# Patient Record
Sex: Female | Born: 1960 | Race: White | Hispanic: No | Smoking: Former smoker
Health system: Southern US, Community
[De-identification: ages and names within clinical notes are randomized; demographics above are authoritative.]

## PROBLEM LIST (undated history)

## (undated) DIAGNOSIS — D649 Anemia, unspecified: Secondary | ICD-10-CM

## (undated) DIAGNOSIS — K219 Gastro-esophageal reflux disease without esophagitis: Secondary | ICD-10-CM

## (undated) DIAGNOSIS — M1990 Unspecified osteoarthritis, unspecified site: Secondary | ICD-10-CM

## (undated) DIAGNOSIS — E669 Obesity, unspecified: Secondary | ICD-10-CM

## (undated) DIAGNOSIS — G43909 Migraine, unspecified, not intractable, without status migrainosus: Secondary | ICD-10-CM

## (undated) DIAGNOSIS — M898X9 Other specified disorders of bone, unspecified site: Secondary | ICD-10-CM

## (undated) DIAGNOSIS — N393 Stress incontinence (female) (male): Secondary | ICD-10-CM

## (undated) HISTORY — DX: Obesity, unspecified: E66.9

## (undated) HISTORY — DX: Stress incontinence (female) (male): N39.3

## (undated) HISTORY — DX: Anemia, unspecified: D64.9

## (undated) HISTORY — DX: Migraine, unspecified, not intractable, without status migrainosus: G43.909

## (undated) HISTORY — DX: Gastro-esophageal reflux disease without esophagitis: K21.9

## (undated) HISTORY — PX: ABDOMINAL HYSTERECTOMY: SHX81

## (undated) HISTORY — DX: Unspecified osteoarthritis, unspecified site: M19.90

## (undated) HISTORY — PX: SPINE SURGERY: SHX786

---

## 2008-11-23 ENCOUNTER — Ambulatory Visit: Payer: Self-pay | Admitting: Family Medicine

## 2008-11-30 ENCOUNTER — Ambulatory Visit: Payer: Self-pay

## 2008-12-28 ENCOUNTER — Ambulatory Visit: Payer: Self-pay | Admitting: Family Medicine

## 2008-12-31 ENCOUNTER — Ambulatory Visit: Payer: Self-pay | Admitting: Family Medicine

## 2009-01-12 ENCOUNTER — Ambulatory Visit: Payer: Self-pay | Admitting: Obstetrics and Gynecology

## 2009-01-13 ENCOUNTER — Ambulatory Visit: Payer: Self-pay | Admitting: Gastroenterology

## 2010-08-15 ENCOUNTER — Ambulatory Visit: Payer: Self-pay | Admitting: Family Medicine

## 2010-08-24 ENCOUNTER — Ambulatory Visit (HOSPITAL_COMMUNITY): Admission: RE | Admit: 2010-08-24 | Discharge: 2010-08-24 | Payer: Self-pay | Admitting: Family Medicine

## 2010-09-05 ENCOUNTER — Ambulatory Visit: Payer: Self-pay | Admitting: Family Medicine

## 2010-10-23 HISTORY — PX: LAPAROSCOPIC HYSTERECTOMY: SHX1926

## 2010-11-02 ENCOUNTER — Ambulatory Visit (HOSPITAL_COMMUNITY): Admission: RE | Admit: 2010-11-02 | Discharge: 2010-11-03 | Payer: Self-pay | Admitting: Family Medicine

## 2010-11-02 ENCOUNTER — Ambulatory Visit: Payer: Self-pay | Admitting: Family Medicine

## 2010-11-22 ENCOUNTER — Ambulatory Visit: Payer: Self-pay | Admitting: Family Medicine

## 2010-12-05 ENCOUNTER — Ambulatory Visit: Payer: Self-pay | Admitting: Family Medicine

## 2010-12-17 DIAGNOSIS — N393 Stress incontinence (female) (male): Secondary | ICD-10-CM

## 2010-12-17 HISTORY — DX: Stress incontinence (female) (male): N39.3

## 2010-12-26 ENCOUNTER — Ambulatory Visit
Admission: RE | Admit: 2010-12-26 | Discharge: 2010-12-26 | Payer: Self-pay | Source: Home / Self Care | Attending: Family Medicine | Admitting: Family Medicine

## 2011-02-27 LAB — CBC
HCT: 40.4 % (ref 36.0–46.0)
MCH: 30.7 pg (ref 26.0–34.0)
MCHC: 33.7 g/dL (ref 30.0–36.0)
MCHC: 33.8 g/dL (ref 30.0–36.0)
Platelets: 145 10*3/uL — ABNORMAL LOW (ref 150–400)
Platelets: 197 10*3/uL (ref 150–400)
RDW: 12.8 % (ref 11.5–15.5)
RDW: 13 % (ref 11.5–15.5)
WBC: 6.4 10*3/uL (ref 4.0–10.5)

## 2011-02-27 LAB — SURGICAL PCR SCREEN
MRSA, PCR: NEGATIVE
Staphylococcus aureus: NEGATIVE

## 2011-02-27 LAB — PREGNANCY, URINE: Preg Test, Ur: NEGATIVE

## 2011-03-06 ENCOUNTER — Ambulatory Visit: Payer: Self-pay | Admitting: Obstetrics and Gynecology

## 2011-03-13 ENCOUNTER — Other Ambulatory Visit: Payer: Self-pay | Admitting: Family Medicine

## 2011-03-13 ENCOUNTER — Encounter: Payer: Self-pay | Admitting: Obstetrics & Gynecology

## 2011-03-13 ENCOUNTER — Ambulatory Visit: Payer: 59 | Admitting: Obstetrics and Gynecology

## 2011-03-13 DIAGNOSIS — Z1272 Encounter for screening for malignant neoplasm of vagina: Secondary | ICD-10-CM

## 2011-03-13 DIAGNOSIS — Z1231 Encounter for screening mammogram for malignant neoplasm of breast: Secondary | ICD-10-CM

## 2011-03-13 DIAGNOSIS — Z01419 Encounter for gynecological examination (general) (routine) without abnormal findings: Secondary | ICD-10-CM

## 2011-03-13 DIAGNOSIS — Z111 Encounter for screening for respiratory tuberculosis: Secondary | ICD-10-CM

## 2011-03-13 LAB — CONVERTED CEMR LAB
AST: 15 units/L (ref 0–37)
Alkaline Phosphatase: 58 units/L (ref 39–117)
CO2: 22 meq/L (ref 19–32)
Calcium: 9.2 mg/dL (ref 8.4–10.5)
Glucose, Bld: 78 mg/dL (ref 70–99)
HDL: 46 mg/dL (ref >39)
Platelets: 198 10*3/uL (ref 150–400)
Potassium: 4.4 meq/L (ref 3.5–5.3)
RBC: 4.94 M/uL (ref 3.87–5.11)
Sodium: 140 meq/L (ref 135–145)
TSH: 1.656 microintl units/mL (ref 0.350–4.500)
Total Bilirubin: 0.5 mg/dL (ref 0.3–1.2)
Total CHOL/HDL Ratio: 3.8
WBC: 5.7 10*3/uL (ref 4.0–10.5)

## 2011-03-14 NOTE — Assessment & Plan Note (Signed)
[name], [name]             ACCOUNT NO.:  [id number]  MEDICAL RECORD NO.:  [id number]           PATIENT TYPE:  O  LOCATION:  [facility] at [facility]          FACILITY:  [facility]  PHYSICIAN:  Allie Bossier, MD        DATE OF BIRTH:  01/06/1961  DATE OF SERVICE:  03/13/2011                                 CLINIC NOTE  [name] is a 50 year old married white G2 P2 who is coming in for annual exam.  Her complaint today is that of worsening stress incontinence.  She says that she essentially has to wear panty liners every day.  Of note, her daughters are cheerleaders and whenever [name] goes to watch her daughter cheers, she inevitably jumps and claps and urinates in her pants.  She has also gained 5 pounds in the last several months.  PAST MEDICAL HISTORY:  Obesity, reflux, and stress incontinence.  PAST SURGICAL HISTORY:  She had a laparoscopic supracervical hysterectomy on November 02, 2010.  SOCIAL HISTORY:  She reports rare alcohol.  She quit smoking when she was age 41.  She denies illegal drug use.  MEDICATIONS:  She takes Prilosec daily, a sleep aid daily, multivitamin daily, fish oil daily, and iron daily.  No latex allergies.  No drug allergies.  No food allergies.  REVIEW OF SYSTEMS:  She is married and monogamous.  She denies dyspareunia.  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  Remainder of her review of systems questions are negative.  FAMILY HISTORY:  No breast, GYN, or colon malignancies.  She says no diabetes or heart disease.  PHYSICAL EXAMINATION:  GENERAL:  A well-nourished, well-hydrated white female in no apparent distress. HEENT:  Normal. BREASTS:  Normal. HEART:  Regular rhythm. LUNGS:  Clear to auscultation bilaterally. ABDOMEN:  No palpable hepatosplenomegaly. EXTERNAL GENITALIA:  Normal.  Bimanual exam reveals no pelvic masses and her pelvic exam induces no tenderness.  She has a positive Q-tip test.  ASSESSMENT AND PLAN: 1. Annual exam.   I have checked A Pap smear.  Recommended self-breast     and self-vulvar exams.  Recommended annual mammograms and that she     have a colonoscopy. 2. Stress incontinence.  After discussion of risks and benefits, she     would like to have a mid urethral sling.  I am     scheduling her for a outpatient Advantage Fit sling.  I have also     recommended weight loss for her general health maintenance.  I am     checking fasting lipids, CBC, TSH, and CMET.     Allie Bossier, MD    MCD/MEDQ  D:  03/13/2011  T:  03/14/2011  Job:  [id number]

## 2011-03-21 ENCOUNTER — Ambulatory Visit (HOSPITAL_COMMUNITY): Payer: 59

## 2011-03-22 ENCOUNTER — Ambulatory Visit (HOSPITAL_COMMUNITY)
Admission: RE | Admit: 2011-03-22 | Discharge: 2011-03-22 | Disposition: A | Discharge status: Home / Self Care | Payer: 59 | Source: Ambulatory Visit | Attending: Family Medicine | Admitting: Family Medicine

## 2011-03-22 DIAGNOSIS — Z1231 Encounter for screening mammogram for malignant neoplasm of breast: Secondary | ICD-10-CM

## 2011-03-28 ENCOUNTER — Other Ambulatory Visit: Payer: Self-pay | Admitting: Family Medicine

## 2011-03-28 DIAGNOSIS — R928 Other abnormal and inconclusive findings on diagnostic imaging of breast: Secondary | ICD-10-CM

## 2011-04-02 ENCOUNTER — Ambulatory Visit
Admission: RE | Admit: 2011-04-02 | Discharge: 2011-04-02 | Disposition: A | Discharge status: Home / Self Care | Payer: 59 | Source: Ambulatory Visit | Attending: Family Medicine | Admitting: Family Medicine

## 2011-04-02 DIAGNOSIS — R928 Other abnormal and inconclusive findings on diagnostic imaging of breast: Secondary | ICD-10-CM

## 2011-05-01 NOTE — Assessment & Plan Note (Signed)
[name], Vanessa             ACCOUNT NO.:  [id number]   MEDICAL RECORD NO.:  [id number]          PATIENT TYPE:  POB   LOCATION:  [facility] at [facility]         FACILITY:  [facility]   PHYSICIAN:  Tinnie Gens, MD        DATE OF BIRTH:  05-12-1961   DATE OF SERVICE:  12/26/2010                                  CLINIC NOTE   CHIEF COMPLAINT:  Postop check.   HISTORY OF PRESENT ILLNESS:  The patient is a 50 year old para 2 who  underwent laparoscopic supracervical hysterectomy for bleeding  associated with fibroids on November 02, 2010.  Postop course has been  benign.  She is back at work.  She is doing well.  She has not resumed  sexual activity.  She is having some issues related to mood and  dissatisfaction with her job and home life.  Additionally, we had a  lengthy discussion regarding her daughter who is on Microgestin, oral  contraceptive and seems to be doing okay, so she does not want to take  it.  She would like to do a trial of NuvaRing.   PHYSICAL EXAMINATION:  VITAL SIGNS:  Are as noted in the chart.  GENERAL:  She is a well-developed well-nourished female in no acute  distress.  ABDOMEN:  Soft, nontender, nondistended.  Incisions are all well healed.  GU:  Normal external female genitalia.  BUS is normal.  Vagina is pink  and rugated.  Cervix is parous without lesion.  Uterus and adnexa are  surgically absent.   IMPRESSION:  1. Postop check doing well.  2. Daughter who is on oral contraceptives at age 17.   PLAN:  1. The patient should come back in March for another Pap smear as she      still has the cervix.  2. I have given her a sample of NuvaRing to try with her daughter next      month, __________ have her daughter come in and see [place] and we will      write a prescription and do full eval.           ______________________________  Tinnie Gens, MD     TP/MEDQ  D:  12/26/2010  T:  12/27/2010  Job:  [id number]

## 2011-05-01 NOTE — Assessment & Plan Note (Signed)
[name], [name]             ACCOUNT NO.:  [id number]   MEDICAL RECORD NO.:  [id number]          PATIENT TYPE:  POB   LOCATION:  [facility] at [facility]         FACILITY:  [facility]   PHYSICIAN:  Tinnie Gens, MD        DATE OF BIRTH:  21-Jul-1961   DATE OF SERVICE:  11/22/2010                                  CLINIC NOTE   CHIEF COMPLAINT:  Postop visit.   HISTORY OF PRESENT ILLNESS:  The patient is a 50 year old para 2 who  underwent a laparoscopic supracervical hysterectomy for bleeding  associated with fibroids.  She had that procedure done on November 02, 2010.  Her procedure went well.  Her postop course has been benign.  Her  pathology is reviewed, it showed benign leiomyoma, normal endometrium,  adenomyosis.  The patient reports improved pain control, improved  problems with constipation, and increasing activity.  She is tolerating  p.o. well, voiding without difficulty.   PHYSICAL EXAMINATION:  VITAL SIGNS:  As noted in the chart.  ABDOMEN:  Her incisions are well-healed.  There is a small amount of  suture noted at the right lower quadrant port, which was trimmed.  I  have advised her to use hydrogen peroxide on gauze pad and scrub until  that goes away.   IMPRESSION:  Status postop laparoscopic cervical hysterectomy, doing  well.   PLAN:  Follow up in 4 weeks for final postop check, may return to work  with limited duty on December 04, 2010.           ______________________________  Tinnie Gens, MD     TP/MEDQ  D:  11/22/2010  T:  11/23/2010  Job:  [id number]

## 2011-05-01 NOTE — Assessment & Plan Note (Signed)
NAMEJESSE, HIRST             ACCOUNT NO.:  [id number]   MEDICAL RECORD NO.:  [id number]          PATIENT TYPE:  POB   LOCATION:  [facility] at [facility]         FACILITY:  [facility]   PHYSICIAN:  Tinnie Gens, MD        DATE OF BIRTH:  10-31-1961   DATE OF SERVICE:  09/05/2010                                  CLINIC NOTE   CHIEF COMPLAINT:  Followup results.   HISTORY OF PRESENT ILLNESS:  The patient is a 50 year old para 2 who has  previously been seen for abnormal uterine bleeding.  She has undergone  endometrial sampling x2 which has revealed a polyp.  She has also had  her pill changed from a 30 mcg pill to a 35 mcg pill.  She had  ultrasound that showed a normal-appearing uterus with 2 small fibroids  1.2 x 1.5 x 1.5 x 1.1 x 1.2 x 1.5.  The uterus itself was 7 x 4 x 5 cm.  The ovaries appeared normal.  Lengthy discussion was held with the  patient about alternatives for treatment at her last visit and she  returns today after hearing results to discuss treatment options.  She  is really interested in definitive treatment which was involved  hysterectomy and we discussed laparoscopic hysterectomy as probably the  easiest and best way to go.  The patient is worried about her disability  from work and may opt for less definitive, although perhaps equally  effective hydrothermal ablation.   IMPRESSION:  Abnormal uterine bleeding uncontrolled with oral  contraceptive with negative workup.   PLAN:  The patient will discuss with her husband and will call back with  her decision regarding D and C, hysteroscopy with HTA versus  laparoscopic supracervical hysterectomy.           ______________________________  Tinnie Gens, MD     TP/MEDQ  D:  09/05/2010  T:  09/06/2010  Job:  [id number]

## 2011-05-01 NOTE — Assessment & Plan Note (Signed)
NAMEDENNICE, TINDOL             ACCOUNT NO.:  [id number]   MEDICAL RECORD NO.:  [id number]          PATIENT TYPE:  POB   LOCATION:  [facility]         FACILITY:  [facility]   PHYSICIAN:  Tinnie Gens, MD        DATE OF BIRTH:  03/05/1961   DATE OF SERVICE:  08/15/2010                                  CLINIC NOTE   CHIEF COMPLAINT:  Breakthrough bleeding.   HISTORY OF PRESENT ILLNESS:  The patient is a 50 year old para 2 who was  seen previously in approximately 18 months ago with abnormal uterine  bleeding.  She had a thickened endometrial stripe of 1.82 cm.  She  underwent endometrial sampling which showed benign pathology.  The  patient had been placed on a 30 mcg pill and had been doing well with  that.  She initially had heavy bleeding with a hemoglobin of 6.  However  since being on a 30 mcg pill, her hemoglobin had come up to 12 and since  her biopsy was normal, she was content to found that.  However over the  last 2-3 months, she has had breakthrough bleeding where she had no  bleeding the first week of her pill pack and then she had bleeding for  the rest of the time, followed by normal cycle on the last pill pack.  She had a Pap in March 2011 was normal.  Her hemoglobin levels have been  fine.  She also reports being extremely hot in the morning with  sweating, but not having true night sweats or hot flashes during the  day.   On exam today, vitals are as noted in the chart.  She is a well-  developed, well-nourished female in no acute distress.  Abdomen is soft,  nontender, nondistended.  GU, normal external female genitalia.  BUS is  normal.  Vagina is pink and rugated.  Cervix is parous.  There is a bit  of growth coming of the cervix that was taken off with a ring forceps.  The uterus was small and anteverted.   PROCEDURE:  After taking out of the polyp on the cervix, the cervix was  cleaned with Betadine and was grasped anteriorly with a single-tooth  tenaculum and a Pipelle was passed twice to obtain endometrial sampling.  The patient tolerated the procedure well as she was somewhat  uncomfortable during it.  The single-tooth tenaculum was removed from  the cervix.   IMPRESSION:  Abnormal uterine bleeding with history of this prior plus  anemia plus thickened endometrial stripe.   PLAN:  For an [facility] today, pelvic sonogram, endometrial sampling today.  We will change her to a 35 mcg monophasic pill and see her back in 2  weeks for discussion results of other possible treatment options.  We  discussed IUD, endometrial ablation and hysterectomy as potential.           ______________________________  Tinnie Gens, MD     TP/MEDQ  D:  08/15/2010  T:  08/16/2010  Job:  [id number]

## 2011-05-01 NOTE — Assessment & Plan Note (Signed)
[name], [name]             ACCOUNT NO.:  [id number]   MEDICAL RECORD NO.:  [id number]          PATIENT TYPE:  POB   LOCATION:  [facility]         FACILITY:  [facility]   PHYSICIAN:  Tinnie Gens, MD        DATE OF BIRTH:  Jan 27, 1961   DATE OF SERVICE:                                  CLINIC NOTE   CHIEF COMPLAINT:  Menorrhagia.   HISTORY OF PRESENT ILLNESS:  The patient is a 50 year old para 2, who is  referred by [facility] family [facility] for menorrhagia and anemia,  specifically Liane Comber, NP referred her.  She was seen there in  December of this year and was found to have a hemoglobin of 6.  At that  time, she underwent a CPE and had her Pap smear done.  She is scheduled  for mammogram on Friday of this week.  She is found to be Hemoccult  negative with this very low hemoglobin.  At that time, she was started  on Loestrin.  A pelvic ultrasound was obtained and she was started on  iron t.i.d.  Her hemoglobin has come up.  Her most recent hemoglobin was  12.2, which is much improved.  She has decreased her iron to twice daily  since that time, though Loestrin has done really well to control her  bleeding.  The patient reports that her cycles have just gotten heavier  since the birth of her last daughter 15 years ago, but her cycles are  regular, just lasts longer and seems to be heavier.  Her pelvic sonogram  is with her today and is reviewed, which shows a mildly enlarged uterus  at 9.7 x 5.2 x 6.3.  She has one small fibroids that is posterior, that  is 1.3 x 1.8 x 1.6, which is unlikely to be causing her issue.  She has  a small cyst on her right ovary, probably functional, but she does have  a thickened endometrial stripe at 1.82 cm.  The sonogram was done right  before her cycle.  It could be that her thickened endometrial was  related to that.  However, given that she is over 35, she does need  endometrial sampling.   PAST MEDICAL HISTORY:  Significant for  osteoarthritis.   PAST SURGICAL HISTORY:  Negative.   MEDICATIONS:  She is on Prilosec daily, iron twice daily, Aviane, I  think generic for Loestrin, multivitamin, and over-the-counter sleep aid  at bedtime as needed.   ALLERGIES:  None known.   OBSTETRICAL HISTORY:  She is a para 2 and 2 vaginal deliveries.   GYN HISTORY:  Menarche at age 60.  Cycles every 28 days, last for 6 days  with heavy-to-moderate flow and some pain with certain periods, but not  every time.  She has no history of abnormal Pap smear.  She has not had  colonoscopy as yet.   FAMILY HISTORY:  Negative for diabetes, coronary artery disease, or  cancer.   SOCIAL HISTORY:  She works for [employer]. [employer].  She lives with  husband and 2 children.  She was a previous smoker, but has been clear  for the past 4 years.  No alcohol use or drug use.   REVIEW OF SYSTEMS:  A 14-point review of systems is reviewed, is  positive for numbness and weakness in her fingers, swelling in her legs,  fatigue, weight gain, dizzy spells, problems with breathing, shortness  of breath, some hot flashes and loss of urine with coughing and sneezing  a lot.  This was adjusted by her primary care Cleotha Whalin and a lot of that  was due to anemia.  She does feel much improved in terms of her  dizziness and fatigue, as well as, her shortness of breath.   PHYSICAL EXAMINATION:  VITAL SIGNS:  Today her weight is 192, blood  pressure is 112/81, pulse is 69.  GENERAL:  She is well-developed, well-nourished female in no acute  distress.  HEENT:  Normocephalic and atraumatic.  Sclerae anicteric.  NECK:  Supple.  LUNGS:  Clear.  CARDIOVASCULAR:  Regular.  ABDOMEN:  Soft, nontender.  No masses.  GENITOURINARY:  Normal external female genitalia.  BUS is normal.  Vagina is pink and rugated.  Cervix is parous without lesion.  Uterus is  small, approximately 10-week size.  No adnexal mass or tenderness is  appreciated.   PROCEDURE:  Cervix is  cleaned with Betadine x2, it was grasped  anteriorly with single tooth tenaculum.  It sounds to approximately 9  cm.  Endometrial sampling was taken without difficulty.   IMPRESSION:  1. Menorrhagia.  2. Anemia.  3. Thickened endometrial stripe.   PLAN:  Endometrial biopsy.  Follow up 2 weeks for results of this and  discussion of alternatives to treatment.  A discussion was had with her  about continuing on OC's or an IUD, endometrial ablation versus  hysterectomy, but none of these would be in consideration until biopsy  results are back.   Thank you very much for referring this patient.  I look forward to  participating in her care.  We will keep you abreast of any pathology or  decisions that were made regarding her care.           ______________________________  Tinnie Gens, MD     TP/MEDQ  D:  12/28/2008  T:  12/29/2008  Job:  [id number]

## 2011-05-02 ENCOUNTER — Ambulatory Visit (HOSPITAL_COMMUNITY)
Admission: RE | Admit: 2011-05-02 | Discharge: 2011-05-02 | Disposition: A | Discharge status: Home / Self Care | Payer: 59 | Source: Ambulatory Visit | Attending: Obstetrics & Gynecology | Admitting: Obstetrics & Gynecology

## 2011-05-02 DIAGNOSIS — N393 Stress incontinence (female) (male): Secondary | ICD-10-CM

## 2011-05-02 DIAGNOSIS — N9089 Other specified noninflammatory disorders of vulva and perineum: Secondary | ICD-10-CM

## 2011-05-02 HISTORY — PX: OTHER SURGICAL HISTORY: SHX169

## 2011-05-02 LAB — CBC
HCT: 44.6 % (ref 36.0–46.0)
MCHC: 33.6 g/dL (ref 30.0–36.0)
MCV: 88.3 fL (ref 78.0–100.0)
RDW: 12.9 % (ref 11.5–15.5)

## 2011-05-17 ENCOUNTER — Encounter (INDEPENDENT_AMBULATORY_CARE_PROVIDER_SITE_OTHER): Payer: 59 | Admitting: Family Medicine

## 2011-05-17 DIAGNOSIS — Z09 Encounter for follow-up examination after completed treatment for conditions other than malignant neoplasm: Secondary | ICD-10-CM

## 2011-05-17 DIAGNOSIS — K5900 Constipation, unspecified: Secondary | ICD-10-CM

## 2011-05-18 NOTE — Assessment & Plan Note (Signed)
[name], [name]             ACCOUNT NO.:  [id number]  MEDICAL RECORD NO.:  [id number]           PATIENT TYPE:  LOCATION:  [facility]           FACILITY:  PHYSICIAN:  Tinnie Gens, MD        DATE OF BIRTH:  [birth year]  DATE OF SERVICE:  05/17/2011                                 CLINIC NOTE  CHIEF COMPLAINT:  Postop having pain.  HISTORY OF PRESENT ILLNESS:  This is a 50 year old gravida 2, para 2 who underwent a TVT by Dr. Nicholaus Bloom last 2 weeks ago.  Since that time, she has been doing well, has been on pain meds from last Friday.  However, she returned to work on Tuesday and for 2 days about mid morning she was having increasing lower abdominal cramping, needs the pain medicine, which she has run out of.  She reports 30 minutes after taking it has felt better. She is reporting good result with no retention, and no real problems with sneezing or coughing.  No real leakage was sneezing or coughing.  PHYSICAL EXAMINATION:  VITAL SIGNS:  As noted in the chart. GENERAL:  She is well-developed, well-nourished female in no acute distress. GU:  Normal external female genitalia.  BUS is normal.  Vagina is pink. Cervix is present.  We can feel the TVT through the underlying vaginal mucosa.  There is no erosion there.   IMPRESSION:  Unclear etiology with continued pain with working.  I wonder if she is having bladder spasms or this is just a scarring from the device.  PLAN:  We will write her for peridium 10 mg t.i.d. as needed, and I have refilled Percocet #20, no refills.          ______________________________ Tinnie Gens, MD    TP/MEDQ  D:  05/17/2011  T:  05/17/2011  Job:  [id number]

## 2011-05-28 NOTE — Op Note (Signed)
NAMEBREEANN, REPOSA             ACCOUNT NO.:  [id number]  MEDICAL RECORD NO.:  [id number]           PATIENT TYPE:  O  LOCATION:  [facility]                          FACILITY:  [facility]  PHYSICIAN:  Allie Bossier, MD        DATE OF BIRTH:  09/17/1961  DATE OF PROCEDURE:  05/02/2011 DATE OF DISCHARGE:                              OPERATIVE REPORT   PREOPERATIVE DIAGNOSES: 1. Genuine stress urinary incontinence. 2. Vulvar lesion.  POSTOPERATIVE DIAGNOSES: 1. Genuine stress urinary incontinence. 2. Vulvar lesion.  PROCEDURES: 1. Midurethral sling (TVT Exact) cystoscopy. 2. Excision of vulvar lesion.  SURGEON:  Allie Bossier, MD  ASSISTANT:  Horton Chin, MD  COMPLICATIONS:  None.  ESTIMATED BLOOD LOSS:  Minimal.  SPECIMENS:  None.  FINDINGS:  Normal cystoscopy.  DETAILED PROCEDURE AND FINDINGS:  The risks, benefits, and alternatives of surgery were explained, understood, and accepted.  Consents were signed.  Preoperatively, the patient requested that I remove a vulvar lesion from her left labia minor.  I had noticed this before essentially an embedded whitehead that measures about a centimeter, but I agreed to open it and excised the contents for her.  I had discussed with her and husband not being able to avoid immediately after surgery and having the problem with catheter.  I discussed risks of some postvoid dribbling as well as discussed with her in the office through increased risk of new onset urge incontinence.  All questions were answered in the operating room.  General anesthesia was applied without complication.  She was placed in dorsal lithotomy position.  Her lower abdomen and vagina were prepped and draped in usual sterile fashion.  A weighted speculum was placed posteriorly.  I injected approximately 1 mL of 0.5% Marcaine in the skin near the vulvar lesion.  I made a 5-mm incision to excise both whitehead material from this.  I placed a silver nitrate at the  opening of the incision to yield excellent hemostasis.  I then made marks centimeter and half lateral to the midline on each side behind the symphysis pubis.  I injected 10 mL of 0.5% Marcaine for hydrodissection purposes and postop pain relief vertically behind the symphysis pubis on each side at the site of these marks.  I then injected the vaginal mucosa with 0.5% Marcaine, and total of 13 mL was used.  I made a 1-cm incision in the vaginal mucosa approximately a centimeter and half below the urethral opening.  I placed a catheter with a bladder stylus and deviated the bladder out of the operative site.  I used Metzenbaum scissors to dissect on each side of the urethra.  I then placed the TVT Exact on the patient's right and brought it up through the marked site behind her symphysis pubis.  I repeated the procedure on the left side. I then performed cystoscopy and noted that the bladder did not have any complications from cystotomies.  The remainder of cystoscopy was normal. I placed a Kocher at its shank between the sling and the vaginal mucosa and removed the plastic sheath and cut the sling at the skin edges.  These skin edges were very small and were closed with Steri-Strips on each side.  The vaginal incision was closed with a 3-0 Vicryl running locking suture.  Excellent hemostasis was noted.  She was extubated and taken to the recovery room in stable condition.  Instrument, sponge and needle counts were correct.     Allie Bossier, MD     MCD/MEDQ  D:  05/02/2011  T:  05/03/2011  Job:  [id number]  Electronically Signed by Nicholaus Bloom MD on 05/28/2011 09:45:30 AM

## 2011-06-19 ENCOUNTER — Ambulatory Visit: Payer: 59 | Admitting: Obstetrics & Gynecology

## 2011-07-04 ENCOUNTER — Encounter: Payer: Self-pay | Admitting: *Deleted

## 2011-07-04 DIAGNOSIS — E669 Obesity, unspecified: Secondary | ICD-10-CM | POA: Insufficient documentation

## 2011-07-04 DIAGNOSIS — M1990 Unspecified osteoarthritis, unspecified site: Secondary | ICD-10-CM | POA: Insufficient documentation

## 2011-07-04 DIAGNOSIS — K219 Gastro-esophageal reflux disease without esophagitis: Secondary | ICD-10-CM | POA: Insufficient documentation

## 2011-07-04 DIAGNOSIS — M19039 Primary osteoarthritis, unspecified wrist: Secondary | ICD-10-CM | POA: Insufficient documentation

## 2011-07-04 HISTORY — DX: Primary osteoarthritis, unspecified wrist: M19.039

## 2011-07-05 ENCOUNTER — Ambulatory Visit: Payer: 59 | Admitting: Obstetrics & Gynecology

## 2011-07-05 DIAGNOSIS — Z09 Encounter for follow-up examination after completed treatment for conditions other than malignant neoplasm: Secondary | ICD-10-CM

## 2011-07-18 ENCOUNTER — Telehealth: Payer: Self-pay | Admitting: *Deleted

## 2011-07-18 NOTE — Telephone Encounter (Signed)
Patient called complaining of pelvic pain for several days, worse on the L side sometimes sharp but usually dull aching pain.  I advised patient that it would be a good idea to get an ultrasound to see what exactly was going on.  She feels like it is her ovary, but wishes to give it a few more days taking Ibuprofin every 7 hrs and see how she feels.  I advised her to proceed to [facility] if her pain worsens or becomes consistent.  She agrees.

## 2012-01-29 ENCOUNTER — Encounter: Payer: Self-pay | Admitting: Family Medicine

## 2012-01-29 ENCOUNTER — Ambulatory Visit (INDEPENDENT_AMBULATORY_CARE_PROVIDER_SITE_OTHER): Payer: 59 | Admitting: Family Medicine

## 2012-01-29 VITALS — BP 142/100 | HR 91 | Ht 68.0 in | Wt 206.0 lb

## 2012-01-29 DIAGNOSIS — Z1272 Encounter for screening for malignant neoplasm of vagina: Secondary | ICD-10-CM

## 2012-01-29 DIAGNOSIS — Z01419 Encounter for gynecological examination (general) (routine) without abnormal findings: Secondary | ICD-10-CM

## 2012-01-29 NOTE — Progress Notes (Signed)
Patient is here for routine yearly exam.  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  She will be losing her insurance soon and would like her iron checked as well.  Would like flu shot today.

## 2012-01-29 NOTE — Progress Notes (Signed)
  Subjective:     [name] is a 51 y.o. female and is here for a comprehensive physical exam. The patient reports problems - some cyclical pain lasting one day.Marland Kitchen  History   Social History  . Marital Status: Married    Spouse Name: N/A    Number of Children: N/A  . Years of Education: N/A   Occupational History  . Not on file.   Social History Main Topics  . Smoking status: Former Smoker    Types: Cigarettes    Quit date: 01/28/2003  . Smokeless tobacco: Never Used  . Alcohol Use: Not on file  . Drug Use: Not on file  . Sexually Active: Yes -- Female partner(s)   Other Topics Concern  . Not on file   Social History Narrative  . No narrative on file   Health Maintenance  Topic Date Due  . Pap Smear  08/09/1981  . Tetanus/tdap  08/09/1982  . Influenza Vaccine  09/17/2011    The following portions of the patient's history were reviewed and updated as appropriate: allergies, current medications, past family history, past medical history, past social history, past surgical history and problem list.  Review of Systems A comprehensive review of systems was negative.   Objective:    BP 142/100  Pulse 91  Ht 5\' 8"  (1.727 m)  Wt 206 lb (93.441 kg)  BMI 31.32 kg/m2 General appearance: alert, cooperative and appears stated age Head: Normocephalic, without obvious abnormality, atraumatic Neck: no adenopathy, supple, symmetrical, trachea midline and thyroid not enlarged, symmetric, no tenderness/mass/nodules Lungs: clear to auscultation bilaterally Breasts: normal appearance, no masses or tenderness Heart: regular rate and rhythm, S1, S2 normal, no murmur, click, rub or gallop Abdomen: soft, non-tender; bowel sounds normal; no masses,  no organomegaly Pelvic: cervix normal in appearance, external genitalia normal, no adnexal masses or tenderness, no cervical motion tenderness, uterus surgically absent and vagina normal without discharge Extremities: extremities normal,  atraumatic, no cyanosis or edema Pulses: 2+ and symmetric Skin: Skin color, texture, turgor normal. No rashes or lesions Lymph nodes: Cervical, supraclavicular, and axillary nodes normal. Neurologic: Grossly normal    Assessment:    Healthy female exam. Mittlesmertz pain     Plan:  Pap smear Mammogram CBC, TSH, CMP, Lipid profile   See After Visit Summary for Counseling Recommendations

## 2012-02-04 ENCOUNTER — Other Ambulatory Visit: Payer: 59

## 2014-12-04 ENCOUNTER — Emergency Department (HOSPITAL_COMMUNITY)
Admission: EM | Admit: 2014-12-04 | Discharge: 2014-12-04 | Disposition: A | Discharge status: Home / Self Care | Payer: BC Managed Care – PPO | Attending: Emergency Medicine | Admitting: Emergency Medicine

## 2014-12-04 ENCOUNTER — Emergency Department (HOSPITAL_COMMUNITY): Payer: BC Managed Care – PPO

## 2014-12-04 ENCOUNTER — Encounter (HOSPITAL_COMMUNITY): Payer: Self-pay | Admitting: *Deleted

## 2014-12-04 DIAGNOSIS — M549 Dorsalgia, unspecified: Secondary | ICD-10-CM

## 2014-12-04 DIAGNOSIS — K219 Gastro-esophageal reflux disease without esophagitis: Secondary | ICD-10-CM | POA: Insufficient documentation

## 2014-12-04 DIAGNOSIS — M545 Low back pain, unspecified: Secondary | ICD-10-CM

## 2014-12-04 DIAGNOSIS — Z79899 Other long term (current) drug therapy: Secondary | ICD-10-CM | POA: Diagnosis not present

## 2014-12-04 DIAGNOSIS — E669 Obesity, unspecified: Secondary | ICD-10-CM | POA: Diagnosis not present

## 2014-12-04 DIAGNOSIS — Z8742 Personal history of other diseases of the female genital tract: Secondary | ICD-10-CM | POA: Insufficient documentation

## 2014-12-04 DIAGNOSIS — G8929 Other chronic pain: Secondary | ICD-10-CM | POA: Insufficient documentation

## 2014-12-04 DIAGNOSIS — Z87891 Personal history of nicotine dependence: Secondary | ICD-10-CM | POA: Insufficient documentation

## 2014-12-04 HISTORY — DX: Other specified disorders of bone, unspecified site: M89.8X9

## 2014-12-04 MED ORDER — HYDROCODONE-ACETAMINOPHEN 5-325 MG PO TABS: 1.0000 | ORAL_TABLET | ORAL | Status: DC | PRN

## 2014-12-04 MED ORDER — METHOCARBAMOL 500 MG PO TABS
1000.0000 mg | ORAL_TABLET | Freq: Once | ORAL | Status: AC
  Administered 2014-12-04: 1000 mg via ORAL
  Filled 2014-12-04: qty 2

## 2014-12-04 MED ORDER — METHOCARBAMOL 750 MG PO TABS: 750.0000 mg | ORAL_TABLET | Freq: Four times a day (QID) | ORAL | Status: DC | PRN

## 2014-12-04 NOTE — Discharge Instructions (Signed)
Read the information below.  Use the prescribed medication as directed.  Please discuss all new medications with your pharmacist.  Do not take additional tylenol while taking the prescribed pain medication to avoid overdose.  You may return to the Emergency Department at any time for worsening condition or any new symptoms that concern you.   If you develop fevers, loss of control of bowel or bladder, weakness or numbness in your legs, or are unable to walk, return to the ER for a recheck.    Back Pain, Adult Low back pain is very common. About 1 in 5 people have back pain.The cause of low back pain is rarely dangerous. The pain often gets better over time.About half of people with a sudden onset of back pain feel better in just 2 weeks. About 8 in 10 people feel better by 6 weeks.  CAUSES Some common causes of back pain include:  Strain of the muscles or ligaments supporting the spine.  Wear and tear (degeneration) of the spinal discs.  Arthritis.  Direct injury to the back. DIAGNOSIS Most of the time, the direct cause of low back pain is not known.However, back pain can be treated effectively even when the exact cause of the pain is unknown.Answering your caregiver's questions about your overall health and symptoms is one of the most accurate ways to make sure the cause of your pain is not dangerous. If your caregiver needs more information, he or she may order lab work or imaging tests (X-rays or MRIs).However, even if imaging tests show changes in your back, this usually does not require surgery. HOME CARE INSTRUCTIONS For many people, back pain returns.Since low back pain is rarely dangerous, it is often a condition that people can learn to [facility] their own.   Remain active. It is stressful on the back to sit or stand in one place. Do not sit, drive, or stand in one place for more than 30 minutes at a time. Take short walks on level surfaces as soon as pain allows.Try to increase  the length of time you walk each day.  Do not stay in bed.Resting more than 1 or 2 days can delay your recovery.  Do not avoid exercise or work.Your body is made to move.It is not dangerous to be active, even though your back may hurt.Your back will likely heal faster if you return to being active before your pain is gone.  Pay attention to your body when you bend and lift. Many people have less discomfortwhen lifting if they bend their knees, keep the load close to their bodies,and avoid twisting. Often, the most comfortable positions are those that put less stress on your recovering back.  Find a comfortable position to sleep. Use a firm mattress and lie on your side with your knees slightly bent. If you lie on your back, put a pillow under your knees.  Only take over-the-counter or prescription medicines as directed by your caregiver. Over-the-counter medicines to reduce pain and inflammation are often the most helpful.Your caregiver may prescribe muscle relaxant drugs.These medicines help dull your pain so you can more quickly return to your normal activities and healthy exercise.  Put ice on the injured area.  Put ice in a plastic bag.  Place a towel between your skin and the bag.  Leave the ice on for 15-20 minutes, 03-04 times a day for the first 2 to 3 days. After that, ice and heat may be alternated to reduce pain and spasms.  Ask your caregiver about trying back exercises and gentle massage. This may be of some benefit.  Avoid feeling anxious or stressed.Stress increases muscle tension and can worsen back pain.It is important to recognize when you are anxious or stressed and learn ways to manage it.Exercise is a great option. SEEK MEDICAL CARE IF:  You have pain that is not relieved with rest or medicine.  You have pain that does not improve in 1 week.  You have new symptoms.  You are generally not feeling well. SEEK IMMEDIATE MEDICAL CARE IF:   You have pain  that radiates from your back into your legs.  You develop new bowel or bladder control problems.  You have unusual weakness or numbness in your arms or legs.  You develop nausea or vomiting.  You develop abdominal pain.  You feel faint. Document Released: 12/03/2005 Document Revised: 06/03/2012 Document Reviewed: 04/06/2014 [facility] Patient Information 2015 [place], [place]. This information is not intended to replace advice given to you by your health care provider. Make sure you discuss any questions you have with your health care provider.  Back Injury Prevention Back injuries can be extremely painful and difficult to heal. After having one back injury, you are much more likely to experience another later on. It is important to learn how to avoid injuring or re-injuring your back. The following tips can help you to prevent a back injury. PHYSICAL FITNESS  Exercise regularly and try to develop good tone in your abdominal muscles. Your abdominal muscles provide a lot of the support needed by your back.  Do aerobic exercises (walking, jogging, biking, swimming) regularly.  Do exercises that increase balance and strength (tai chi, yoga) regularly. This can decrease your risk of falling and injuring your back.  Stretch before and after exercising.  Maintain a healthy weight. The more you weigh, the more stress is placed on your back. For every pound of weight, 10 times that amount of pressure is placed on the back. DIET  Talk to your caregiver about how much calcium and vitamin D you need per day. These nutrients help to prevent weakening of the bones (osteoporosis). Osteoporosis can cause broken (fractured) bones that lead to back pain.  Include good sources of calcium in your diet, such as dairy products, green, leafy vegetables, and products with calcium added (fortified).  Include good sources of vitamin D in your diet, such as milk and foods that are fortified with vitamin  D.  Consider taking a nutritional supplement or a multivitamin if needed.  Stop smoking if you smoke. POSTURE  Sit and stand up straight. Avoid leaning forward when you sit or hunching over when you stand.  Choose chairs with good low back (lumbar) support.  If you work at a desk, sit close to your work so you do not need to lean over. Keep your chin tucked in. Keep your neck drawn back and elbows bent at a right angle. Your arms should look like the letter "L."  Sit high and close to the steering wheel when you drive. Add a lumbar support to your car seat if needed.  Avoid sitting or standing in one position for too long. Take breaks to get up, stretch, and walk around at least once every hour. Take breaks if you are driving for long periods of time.  Sleep on your side with your knees slightly bent, or sleep on your back with a pillow under your knees. Do not sleep on your stomach. LIFTING, TWISTING, AND  REACHING  Avoid heavy lifting, especially repetitive lifting. If you must do heavy lifting:  Stretch before lifting.  Work slowly.  Rest between lifts.  Use carts and dollies to move objects when possible.  Make several small trips instead of carrying 1 heavy load.  Ask for help when you need it.  Ask for help when moving big, awkward objects.  Follow these steps when lifting:  Stand with your feet shoulder-width apart.  Get as close to the object as you can. Do not try to pick up heavy objects that are far from your body.  Use handles or lifting straps if they are available.  Bend at your knees. Squat down, but keep your heels off the floor.  Keep your shoulders pulled back, your chin tucked in, and your back straight.  Lift the object slowly, tightening the muscles in your legs, abdomen, and buttocks. Keep the object as close to the center of your body as possible.  When you put a load down, use these same guidelines in reverse.  Do not:  Lift the object  above your waist.  Twist at the waist while lifting or carrying a load. Move your feet if you need to turn, not your waist.  Bend over without bending at your knees.  Avoid reaching over your head, across a table, or for an object on a high surface. OTHER TIPS  Avoid wet floors and keep sidewalks clear of ice to prevent falls.  Do not sleep on a mattress that is too soft or too hard.  Keep items that are used frequently within easy reach.  Put heavier objects on shelves at waist level and lighter objects on lower or higher shelves.  Find ways to decrease your stress, such as exercise, massage, or relaxation techniques. Stress can build up in your muscles. Tense muscles are more vulnerable to injury.  Seek treatment for depression or anxiety if needed. These conditions can increase your risk of developing back pain. SEEK MEDICAL CARE IF:  You injure your back.  You have questions about diet, exercise, or other ways to prevent back injuries. MAKE SURE YOU:  Understand these instructions.  Will watch your condition.  Will get help right away if you are not doing well or get worse. Document Released: 01/10/2005 Document Revised: 02/25/2012 Document Reviewed: 01/14/2012 [facility] Patient Information 2015 [place], [place]. This information is not intended to replace advice given to you by your health care provider. Make sure you discuss any questions you have with your health care provider.

## 2014-12-04 NOTE — ED Provider Notes (Signed)
CSN: [id number]     Arrival date & time 12/04/14  1548 History  This chart was scribed for non-physician practitioner, Clayton Bibles, PA-C, working with Malvin Johns, MD, by Ian Bushman, ED Scribe. This patient was seen in room WTR7/WTR7 and the patient's care was started at 5:39 PM.   Chief Complaint  Patient presents with  . Back Pain    The history is provided by the patient. No language interpreter was used.    HPI Comments: [name] is a 53 y.o. female who presents to the Emergency Department complaining of 8/10, middle lower back pain which occurred PTA. Patient states that she has history of  DDD and when she went to take her groceries out of her trunk, she felt like a sudden shooting pain in her back. Patient suspects that she may have popped something but nothing heard or felt. She states that she cant get comfortable no matter the position and the pain is "excrutiating".  Pain is worse with movement. Patient used a heating pad and took a Vicodin at home after the incident which didn't alleviate her pain. She denies weakness, numbness, saddle anesthesia, fever, chest pain, or abdominal pain.     Past Medical History  Diagnosis Date  . Obesity   . GERD (gastroesophageal reflux disease)   . Genuine stress incontinence, female 2012    patient had surgical repair  . Arthritis   . Degenerative disorder of bone    Past Surgical History  Procedure Laterality Date  . Laparoscopic hysterectomy  10/23/2010    mennorhagia/fibroids  . Midurethral sling  05/02/2011  . Excision of vulvar lesion  05/02/2011   No family history on file. History  Substance Use Topics  . Smoking status: Former Smoker    Types: Cigarettes    Quit date: 01/28/2003  . Smokeless tobacco: Never Used  . Alcohol Use: Yes     Comment: socially   OB History    No data available     Review of Systems  Constitutional: Negative for fever.  Gastrointestinal: Negative for abdominal pain.   Musculoskeletal: Positive for back pain.  Neurological: Negative for weakness and numbness.  All other systems reviewed and are negative.    Allergies  Review of patient's allergies indicates no known allergies.  Home Medications   Prior to Admission medications   Medication Sig Start Date End Date Taking? Authorizing Provider  diphenhydrAMINE (SOMINEX) 25 MG tablet Take 25 mg by mouth at bedtime as needed.      Historical Provider, MD  Multiple Vitamins-Minerals (MULTIVITAMIN WITH MINERALS) tablet Take 1 tablet by mouth daily.    Historical Provider, MD  omeprazole (PRILOSEC) 20 MG capsule Take 20 mg by mouth daily.      Historical Provider, MD   BP 125/85 mmHg  Pulse 79  Temp(Src) 97.9 F (36.6 C) (Oral)  Resp 18  SpO2 98% Physical Exam  Constitutional: She appears well-developed and well-nourished. No distress.  HENT:  Head: Normocephalic and atraumatic.  Neck: Neck supple.  Pulmonary/Chest: Effort normal.  Abdominal: Soft. She exhibits no distension and no mass. There is no tenderness. There is no rebound and no guarding.  Musculoskeletal:  Spine nontender, no crepitus, or stepoffs.  Lower extremities:  Strength 5/5, sensation intact, distal pulses intact.    Straight leg raise negative.   Neurological: She is alert.  Skin: She is not diaphoretic.  Psychiatric: She has a normal mood and affect. Her behavior is normal.  Nursing note and vitals reviewed.  ED Course  Procedures (including critical care time) DIAGNOSTIC STUDIES: Oxygen Saturation is 98% on RA, normal by my interpretation.    COORDINATION OF CARE:  5:44 PM- Pt advised of plan for treatment and pt agrees. Labs Review Labs Reviewed - No data to display  Imaging Review Dg Lumbar Spine Complete  12/04/2014   CLINICAL DATA:  Back pain for 2 years, worse today.  EXAM: LUMBAR SPINE - COMPLETE 4+ VIEW  COMPARISON:  None.  FINDINGS: There is no evidence of lumbar spine fracture. Alignment is normal.  Intervertebral disc spaces are grossly maintained, with slight narrowing at L5-S1 and L1-2.  IMPRESSION: No acute abnormality.   Electronically Signed   By: Rolla Flatten M.D.   On: 12/04/2014 18:01     EKG Interpretation None      MDM   Final diagnoses:  Bilateral low back pain without sciatica    Afebrile, nontoxic patient with exacerbation of chronic back pain.  Mechanism suggests muscle strain. No red flags with history or exam.  Neurovascularly intact.  Lumbar film negative. D/C home with norco, robaxin, PCP follow up.  Discussed result, findings, treatment, and follow up  with patient.  Pt given return precautions.  Pt verbalizes understanding and agrees with plan.        I personally performed the services described in this documentation, which was scribed in my presence. The recorded information has been reviewed and is accurate.    Clayton Bibles, PA-C 12/04/14 1845  Malvin Johns, MD 12/04/14 2218

## 2014-12-04 NOTE — ED Notes (Addendum)
Pt reports loading something into her trunk, bent over, had sudden lower back pain that brought her to tears. Cannot find comfortable position. Hx degenerative bone disease. Would like an xray. Took a vicodin at 3pm.

## 2015-01-13 ENCOUNTER — Encounter: Payer: Self-pay | Admitting: Family Medicine

## 2015-01-13 ENCOUNTER — Ambulatory Visit (INDEPENDENT_AMBULATORY_CARE_PROVIDER_SITE_OTHER): Payer: BLUE CROSS/BLUE SHIELD | Admitting: Family Medicine

## 2015-01-13 VITALS — BP 119/91 | HR 90 | Ht 68.0 in | Wt 203.0 lb

## 2015-01-13 DIAGNOSIS — Z124 Encounter for screening for malignant neoplasm of cervix: Secondary | ICD-10-CM

## 2015-01-13 DIAGNOSIS — Z1151 Encounter for screening for human papillomavirus (HPV): Secondary | ICD-10-CM

## 2015-01-13 DIAGNOSIS — C44329 Squamous cell carcinoma of skin of other parts of face: Secondary | ICD-10-CM

## 2015-01-13 DIAGNOSIS — G4700 Insomnia, unspecified: Secondary | ICD-10-CM

## 2015-01-13 DIAGNOSIS — K219 Gastro-esophageal reflux disease without esophagitis: Secondary | ICD-10-CM

## 2015-01-13 DIAGNOSIS — Z01419 Encounter for gynecological examination (general) (routine) without abnormal findings: Secondary | ICD-10-CM

## 2015-01-13 DIAGNOSIS — M1990 Unspecified osteoarthritis, unspecified site: Secondary | ICD-10-CM

## 2015-01-13 HISTORY — DX: Squamous cell carcinoma of skin of other parts of face: C44.329

## 2015-01-13 HISTORY — DX: Insomnia, unspecified: G47.00

## 2015-01-13 LAB — COMPREHENSIVE METABOLIC PANEL
ALBUMIN: 3.7 g/dL (ref 3.5–5.2)
ALK PHOS: 70 U/L (ref 39–117)
ALT: 23 U/L (ref 0–35)
AST: 16 U/L (ref 0–37)
BUN: 7 mg/dL (ref 6–23)
CHLORIDE: 106 meq/L (ref 96–112)
CO2: 26 meq/L (ref 19–32)
CREATININE: 0.82 mg/dL (ref 0.50–1.10)
Calcium: 9.5 mg/dL (ref 8.4–10.5)
GLUCOSE: 70 mg/dL (ref 70–99)
POTASSIUM: 4.2 meq/L (ref 3.5–5.3)
Sodium: 139 mEq/L (ref 135–145)
TOTAL PROTEIN: 6.2 g/dL (ref 6.0–8.3)
Total Bilirubin: 0.4 mg/dL (ref 0.2–1.2)

## 2015-01-13 LAB — LIPID PANEL
Cholesterol: 185 mg/dL (ref 0–200)
HDL: 43 mg/dL (ref >39)
LDL Cholesterol: 112 mg/dL — ABNORMAL HIGH (ref 0–99)
Total CHOL/HDL Ratio: 4.3 Ratio
Triglycerides: 148 mg/dL (ref <150)
VLDL: 30 mg/dL (ref 0–40)

## 2015-01-13 LAB — TSH: TSH: 1.095 u[IU]/mL (ref 0.350–4.500)

## 2015-01-13 LAB — CBC
HEMATOCRIT: 42.1 % (ref 36.0–46.0)
Hemoglobin: 14.2 g/dL (ref 12.0–15.0)
MCH: 28.3 pg (ref 26.0–34.0)
MCHC: 33.7 g/dL (ref 30.0–36.0)
MCV: 84 fL (ref 78.0–100.0)
MPV: 11.1 fL (ref 8.6–12.4)
PLATELETS: 214 10*3/uL (ref 150–400)
RBC: 5.01 MIL/uL (ref 3.87–5.11)
RDW: 13.7 % (ref 11.5–15.5)
WBC: 4.9 10*3/uL (ref 4.0–10.5)

## 2015-01-13 MED ORDER — DOXEPIN HCL 10 MG PO CAPS: 10.0000 mg | ORAL_CAPSULE | Freq: Every day | ORAL | Status: DC

## 2015-01-13 MED ORDER — OMEPRAZOLE 20 MG PO CPDR: 20.0000 mg | DELAYED_RELEASE_CAPSULE | Freq: Every day | ORAL | Status: DC

## 2015-01-13 NOTE — Progress Notes (Signed)
  Subjective:     [name] is a 54 y.o. female and is here for a comprehensive physical exam. The patient reports problems - insomnia--has failed melatonin and doxylamine. Early morning awakening. Is peri-menopausal and has occasional cycles and has night sweats and hot flashes.   History   Social History  . Marital Status: Married    Spouse Name: N/A    Number of Children: N/A  . Years of Education: N/A   Occupational History  . Not on file.   Social History Main Topics  . Smoking status: Former Smoker    Types: Cigarettes    Quit date: 01/28/2003  . Smokeless tobacco: Never Used  . Alcohol Use: Yes     Comment: socially  . Drug Use: No  . Sexual Activity:    Partners: Male   Other Topics Concern  . Not on file   Social History Narrative   Health Maintenance  Topic Date Due  . Samul Dada  08/09/1982  . MAMMOGRAM  08/09/2013  . COLONOSCOPY  08/09/2013  . INFLUENZA VACCINE  07/17/2014  . PAP SMEAR  01/28/2015    The following portions of the patient's history were reviewed and updated as appropriate: allergies, current medications, past family history, past medical history, past social history, past surgical history and problem list.  Review of Systems Pertinent items are noted in HPI.   Objective:    BP 119/91 mmHg  Pulse 90  Ht 5\' 8"  (1.727 m)  Wt 203 lb (92.08 kg)  BMI 30.87 kg/m2 General appearance: alert, cooperative and appears stated age Head: Normocephalic, without obvious abnormality, atraumatic Neck: no adenopathy, supple, symmetrical, trachea midline and thyroid not enlarged, symmetric, no tenderness/mass/nodules Lungs: clear to auscultation bilaterally Breasts: normal appearance, no masses or tenderness Heart: regular rate and rhythm, S1, S2 normal, no murmur, click, rub or gallop Abdomen: soft, non-tender; bowel sounds normal; no masses,  no organomegaly Pelvic: cervix normal in appearance, external genitalia normal, no adnexal  masses or tenderness, no cervical motion tenderness, uterus surgically absent and vagina normal without discharge Extremities: extremities normal, atraumatic, no cyanosis or edema Pulses: 2+ and symmetric Skin: excoriation - face, left nasolabial fold Lymph nodes: Cervical, supraclavicular, and axillary nodes normal. Neurologic: Grossly normal    Assessment:    Healthy female exam.      Plan:   Problem List Items Addressed This Visit      Unprioritized   GERD (gastroesophageal reflux disease)   Relevant Medications   omeprazole (PRILOSEC) capsule   Arthritis   Relevant Medications   methocarbamol (ROBAXIN) 500 MG tablet   Squamous cell carcinoma of nasolabial fold   Insomnia   Relevant Medications   doxepin (SINEQUAN) capsule    Other Visit Diagnoses    Encounter for routine gynecological examination    -  Primary    Relevant Orders    MM DIGITAL SCREENING BILATERAL    CBC    Comprehensive metabolic panel    Lipid panel    TSH    Screening for malignant neoplasm of cervix        Relevant Orders    Cytology - PAP         See After Visit Summary for Counseling Recommendations

## 2015-01-13 NOTE — Patient Instructions (Signed)
Preventive Care for Adults A healthy lifestyle and preventive care can promote health and wellness. Preventive health guidelines for women include the following key practices.  A routine yearly physical is a good way to check with your health care provider about your health and preventive screening. It is a chance to share any concerns and updates on your health and to receive a thorough exam.  Visit your dentist for a routine exam and preventive care every 6 months. Brush your teeth twice a day and floss once a day. Good oral hygiene prevents tooth decay and gum disease.  The frequency of eye exams is based on your age, health, family medical history, use of contact lenses, and other factors. Follow your health care provider's recommendations for frequency of eye exams.  Eat a healthy diet. Foods like vegetables, fruits, whole grains, low-fat dairy products, and lean protein foods contain the nutrients you need without too many calories. Decrease your intake of foods high in solid fats, added sugars, and salt. Eat the right amount of calories for you.Get information about a proper diet from your health care provider, if necessary.  Regular physical exercise is one of the most important things you can do for your health. Most adults should get at least 150 minutes of moderate-intensity exercise (any activity that increases your heart rate and causes you to sweat) each week. In addition, most adults need muscle-strengthening exercises on 2 or more days a week.  Maintain a healthy weight. The body mass index (BMI) is a screening tool to identify possible weight problems. It provides an estimate of body fat based on height and weight. Your health care provider can find your BMI and can help you achieve or maintain a healthy weight.For adults 20 years and older:  A BMI below 18.5 is considered underweight.  A BMI of 18.5 to 24.9 is normal.  A BMI of 25 to 29.9 is considered overweight.  A BMI of  30 and above is considered obese.  Maintain normal blood lipids and cholesterol levels by exercising and minimizing your intake of saturated fat. Eat a balanced diet with plenty of fruit and vegetables. Blood tests for lipids and cholesterol should begin at age 76 and be repeated every 5 years. If your lipid or cholesterol levels are high, you are over 50, or you are at high risk for heart disease, you may need your cholesterol levels checked more frequently.Ongoing high lipid and cholesterol levels should be treated with medicines if diet and exercise are not working.  If you smoke, find out from your health care provider how to quit. If you do not use tobacco, do not start.  Lung cancer screening is recommended for adults aged 22-80 years who are at high risk for developing lung cancer because of a history of smoking. A yearly low-dose CT scan of the lungs is recommended for people who have at least a 30-pack-year history of smoking and are a current smoker or have quit within the past 15 years. A pack year of smoking is smoking an average of 1 pack of cigarettes a day for 1 year (for example: 1 pack a day for 30 years or 2 packs a day for 15 years). Yearly screening should continue until the smoker has stopped smoking for at least 15 years. Yearly screening should be stopped for people who develop a health problem that would prevent them from having lung cancer treatment.  If you are pregnant, do not drink alcohol. If you are breastfeeding,  be very cautious about drinking alcohol. If you are not pregnant and choose to drink alcohol, do not have more than 1 drink per day. One drink is considered to be 12 ounces (355 mL) of beer, 5 ounces (148 mL) of wine, or 1.5 ounces (44 mL) of liquor.  Avoid use of street drugs. Do not share needles with anyone. Ask for help if you need support or instructions about stopping the use of drugs.  High blood pressure causes heart disease and increases the risk of  stroke. Your blood pressure should be checked at least every 1 to 2 years. Ongoing high blood pressure should be treated with medicines if weight loss and exercise do not work.  If you are 3-86 years old, ask your health care provider if you should take aspirin to prevent strokes.  Diabetes screening involves taking a blood sample to check your fasting blood sugar level. This should be done once every 3 years, after age 67, if you are within normal weight and without risk factors for diabetes. Testing should be considered at a younger age or be carried out more frequently if you are overweight and have at least 1 risk factor for diabetes.  Breast cancer screening is essential preventive care for women. You should practice "breast self-awareness." This means understanding the normal appearance and feel of your breasts and may include breast self-examination. Any changes detected, no matter how small, should be reported to a health care provider. Women in their 8s and 30s should have a clinical breast exam (CBE) by a health care provider as part of a regular health exam every 1 to 3 years. After age 70, women should have a CBE every year. Starting at age 25, women should consider having a mammogram (breast X-ray test) every year. Women who have a family history of breast cancer should talk to their health care provider about genetic screening. Women at a high risk of breast cancer should talk to their health care providers about having an MRI and a mammogram every year.  Breast cancer gene (BRCA)-related cancer risk assessment is recommended for women who have family members with BRCA-related cancers. BRCA-related cancers include breast, ovarian, tubal, and peritoneal cancers. Having family members with these cancers may be associated with an increased risk for harmful changes (mutations) in the breast cancer genes BRCA1 and BRCA2. Results of the assessment will determine the need for genetic counseling and  BRCA1 and BRCA2 testing.  Routine pelvic exams to screen for cancer are no longer recommended for nonpregnant women who are considered low risk for cancer of the pelvic organs (ovaries, uterus, and vagina) and who do not have symptoms. Ask your health care provider if a screening pelvic exam is right for you.  If you have had past treatment for cervical cancer or a condition that could lead to cancer, you need Pap tests and screening for cancer for at least 20 years after your treatment. If Pap tests have been discontinued, your risk factors (such as having a new sexual partner) need to be reassessed to determine if screening should be resumed. Some women have medical problems that increase the chance of getting cervical cancer. In these cases, your health care provider may recommend more frequent screening and Pap tests.  The HPV test is an additional test that may be used for cervical cancer screening. The HPV test looks for the virus that can cause the cell changes on the cervix. The cells collected during the Pap test can be  tested for HPV. The HPV test could be used to screen women aged 30 years and older, and should be used in women of any age who have unclear Pap test results. After the age of 30, women should have HPV testing at the same frequency as a Pap test.  Colorectal cancer can be detected and often prevented. Most routine colorectal cancer screening begins at the age of 50 years and continues through age 75 years. However, your health care provider may recommend screening at an earlier age if you have risk factors for colon cancer. On a yearly basis, your health care provider may provide home test kits to check for hidden blood in the stool. Use of a small camera at the end of a tube, to directly examine the colon (sigmoidoscopy or colonoscopy), can detect the earliest forms of colorectal cancer. Talk to your health care provider about this at age 50, when routine screening begins. Direct  exam of the colon should be repeated every 5-10 years through age 75 years, unless early forms of pre-cancerous polyps or small growths are found.  People who are at an increased risk for hepatitis B should be screened for this virus. You are considered at high risk for hepatitis B if:  You were born in a country where hepatitis B occurs often. Talk with your health care provider about which countries are considered high risk.  Your parents were born in a high-risk country and you have not received a shot to protect against hepatitis B (hepatitis B vaccine).  You have HIV or AIDS.  You use needles to inject street drugs.  You live with, or have sex with, someone who has hepatitis B.  You get hemodialysis treatment.  You take certain medicines for conditions like cancer, organ transplantation, and autoimmune conditions.  Hepatitis C blood testing is recommended for all people born from 1945 through 1965 and any individual with known risks for hepatitis C.  Practice safe sex. Use condoms and avoid high-risk sexual practices to reduce the spread of sexually transmitted infections (STIs). STIs include gonorrhea, chlamydia, syphilis, trichomonas, herpes, HPV, and human immunodeficiency virus (HIV). Herpes, HIV, and HPV are viral illnesses that have no cure. They can result in disability, cancer, and death.  You should be screened for sexually transmitted illnesses (STIs) including gonorrhea and chlamydia if:  You are sexually active and are younger than 24 years.  You are older than 24 years and your health care provider tells you that you are at risk for this type of infection.  Your sexual activity has changed since you were last screened and you are at an increased risk for chlamydia or gonorrhea. Ask your health care provider if you are at risk.  If you are at risk of being infected with HIV, it is recommended that you take a prescription medicine daily to prevent HIV infection. This is  called preexposure prophylaxis (PrEP). You are considered at risk if:  You are a heterosexual woman, are sexually active, and are at increased risk for HIV infection.  You take drugs by injection.  You are sexually active with a partner who has HIV.  Talk with your health care provider about whether you are at high risk of being infected with HIV. If you choose to begin PrEP, you should first be tested for HIV. You should then be tested every 3 months for as long as you are taking PrEP.  Osteoporosis is a disease in which the bones lose minerals and strength   with aging. This can result in serious bone fractures or breaks. The risk of osteoporosis can be identified using a bone density scan. Women ages 65 years and over and women at risk for fractures or osteoporosis should discuss screening with their health care providers. Ask your health care provider whether you should take a calcium supplement or vitamin D to reduce the rate of osteoporosis.  Menopause can be associated with physical symptoms and risks. Hormone replacement therapy is available to decrease symptoms and risks. You should talk to your health care provider about whether hormone replacement therapy is right for you.  Use sunscreen. Apply sunscreen liberally and repeatedly throughout the day. You should seek shade when your shadow is shorter than you. Protect yourself by wearing long sleeves, pants, a wide-brimmed hat, and sunglasses year round, whenever you are outdoors.  Once a month, do a whole body skin exam, using a mirror to look at the skin on your back. Tell your health care provider of new moles, moles that have irregular borders, moles that are larger than a pencil eraser, or moles that have changed in shape or color.  Stay current with required vaccines (immunizations).  Influenza vaccine. All adults should be immunized every year.  Tetanus, diphtheria, and acellular pertussis (Td, Tdap) vaccine. Pregnant women should  receive 1 dose of Tdap vaccine during each pregnancy. The dose should be obtained regardless of the length of time since the last dose. Immunization is preferred during the 27th-36th week of gestation. An adult who has not previously received Tdap or who does not know her vaccine status should receive 1 dose of Tdap. This initial dose should be followed by tetanus and diphtheria toxoids (Td) booster doses every 10 years. Adults with an unknown or incomplete history of completing a 3-dose immunization series with Td-containing vaccines should begin or complete a primary immunization series including a Tdap dose. Adults should receive a Td booster every 10 years.  Varicella vaccine. An adult without evidence of immunity to varicella should receive 2 doses or a second dose if she has previously received 1 dose. Pregnant females who do not have evidence of immunity should receive the first dose after pregnancy. This first dose should be obtained before leaving the health care facility. The second dose should be obtained 4-8 weeks after the first dose.  Human papillomavirus (HPV) vaccine. Females aged 13-26 years who have not received the vaccine previously should obtain the 3-dose series. The vaccine is not recommended for use in pregnant females. However, pregnancy testing is not needed before receiving a dose. If a female is found to be pregnant after receiving a dose, no treatment is needed. In that case, the remaining doses should be delayed until after the pregnancy. Immunization is recommended for any person with an immunocompromised condition through the age of 26 years if she did not get any or all doses earlier. During the 3-dose series, the second dose should be obtained 4-8 weeks after the first dose. The third dose should be obtained 24 weeks after the first dose and 16 weeks after the second dose.  Zoster vaccine. One dose is recommended for adults aged 60 years or older unless certain conditions are  present.  Measles, mumps, and rubella (MMR) vaccine. Adults born before 1957 generally are considered immune to measles and mumps. Adults born in 1957 or later should have 1 or more doses of MMR vaccine unless there is a contraindication to the vaccine or there is laboratory evidence of immunity to   each of the three diseases. A routine second dose of MMR vaccine should be obtained at least 28 days after the first dose for students attending postsecondary schools, health care workers, or international travelers. People who received inactivated measles vaccine or an unknown type of measles vaccine during 1963-1967 should receive 2 doses of MMR vaccine. People who received inactivated mumps vaccine or an unknown type of mumps vaccine before 1979 and are at high risk for mumps infection should consider immunization with 2 doses of MMR vaccine. For females of childbearing age, rubella immunity should be determined. If there is no evidence of immunity, females who are not pregnant should be vaccinated. If there is no evidence of immunity, females who are pregnant should delay immunization until after pregnancy. Unvaccinated health care workers born before 1957 who lack laboratory evidence of measles, mumps, or rubella immunity or laboratory confirmation of disease should consider measles and mumps immunization with 2 doses of MMR vaccine or rubella immunization with 1 dose of MMR vaccine.  Pneumococcal 13-valent conjugate (PCV13) vaccine. When indicated, a person who is uncertain of her immunization history and has no record of immunization should receive the PCV13 vaccine. An adult aged 19 years or older who has certain medical conditions and has not been previously immunized should receive 1 dose of PCV13 vaccine. This PCV13 should be followed with a dose of pneumococcal polysaccharide (PPSV23) vaccine. The PPSV23 vaccine dose should be obtained at least 8 weeks after the dose of PCV13 vaccine. An adult aged 19  years or older who has certain medical conditions and previously received 1 or more doses of PPSV23 vaccine should receive 1 dose of PCV13. The PCV13 vaccine dose should be obtained 1 or more years after the last PPSV23 vaccine dose.  Pneumococcal polysaccharide (PPSV23) vaccine. When PCV13 is also indicated, PCV13 should be obtained first. All adults aged 65 years and older should be immunized. An adult younger than age 65 years who has certain medical conditions should be immunized. Any person who resides in a nursing home or long-term care facility should be immunized. An adult smoker should be immunized. People with an immunocompromised condition and certain other conditions should receive both PCV13 and PPSV23 vaccines. People with human immunodeficiency virus (HIV) infection should be immunized as soon as possible after diagnosis. Immunization during chemotherapy or radiation therapy should be avoided. Routine use of PPSV23 vaccine is not recommended for American Indians, [place] Natives, or people younger than 65 years unless there are medical conditions that require PPSV23 vaccine. When indicated, people who have unknown immunization and have no record of immunization should receive PPSV23 vaccine. One-time revaccination 5 years after the first dose of PPSV23 is recommended for people aged 19-64 years who have chronic kidney failure, nephrotic syndrome, asplenia, or immunocompromised conditions. People who received 1-2 doses of PPSV23 before age 65 years should receive another dose of PPSV23 vaccine at age 65 years or later if at least 5 years have passed since the previous dose. Doses of PPSV23 are not needed for people immunized with PPSV23 at or after age 65 years.  Meningococcal vaccine. Adults with asplenia or persistent complement component deficiencies should receive 2 doses of quadrivalent meningococcal conjugate (MenACWY-D) vaccine. The doses should be obtained at least 2 months apart.  Microbiologists working with certain meningococcal bacteria, military recruits, people at risk during an outbreak, and people who travel to or live in countries with a high rate of meningitis should be immunized. A first-year college student up through age   21 years who is living in a residence hall should receive a dose if she did not receive a dose on or after her 16th birthday. Adults who have certain high-risk conditions should receive one or more doses of vaccine.  Hepatitis A vaccine. Adults who wish to be protected from this disease, have certain high-risk conditions, work with hepatitis A-infected animals, work in hepatitis A research labs, or travel to or work in countries with a high rate of hepatitis A should be immunized. Adults who were previously unvaccinated and who anticipate close contact with an international adoptee during the first 60 days after arrival in the [place] from a country with a high rate of hepatitis A should be immunized.  Hepatitis B vaccine. Adults who wish to be protected from this disease, have certain high-risk conditions, may be exposed to blood or other infectious body fluids, are household contacts or sex partners of hepatitis B positive people, are clients or workers in certain care facilities, or travel to or work in countries with a high rate of hepatitis B should be immunized.  Haemophilus influenzae type b (Hib) vaccine. A previously unvaccinated person with asplenia or sickle cell disease or having a scheduled splenectomy should receive 1 dose of Hib vaccine. Regardless of previous immunization, a recipient of a hematopoietic stem cell transplant should receive a 3-dose series 6-12 months after her successful transplant. Hib vaccine is not recommended for adults with HIV infection. Preventive Services / Frequency Ages 64 to 68 years  Blood pressure check.** / Every 1 to 2 years.  Lipid and cholesterol check.** / Every 5 years beginning at age  22.  Clinical breast exam.** / Every 3 years for women in their 88s and 53s.  BRCA-related cancer risk assessment.** / For women who have family members with a BRCA-related cancer (breast, ovarian, tubal, or peritoneal cancers).  Pap test.** / Every 2 years from ages 90 through 51. Every 3 years starting at age 21 through age 56 or 3 with a history of 3 consecutive normal Pap tests.  HPV screening.** / Every 3 years from ages 24 through ages 1 to 46 with a history of 3 consecutive normal Pap tests.  Hepatitis C blood test.** / For any individual with known risks for hepatitis C.  Skin self-exam. / Monthly.  Influenza vaccine. / Every year.  Tetanus, diphtheria, and acellular pertussis (Tdap, Td) vaccine.** / Consult your health care provider. Pregnant women should receive 1 dose of Tdap vaccine during each pregnancy. 1 dose of Td every 10 years.  Varicella vaccine.** / Consult your health care provider. Pregnant females who do not have evidence of immunity should receive the first dose after pregnancy.  HPV vaccine. / 3 doses over 6 months, if 72 and younger. The vaccine is not recommended for use in pregnant females. However, pregnancy testing is not needed before receiving a dose.  Measles, mumps, rubella (MMR) vaccine.** / You need at least 1 dose of MMR if you were born in 1957 or later. You may also need a 2nd dose. For females of childbearing age, rubella immunity should be determined. If there is no evidence of immunity, females who are not pregnant should be vaccinated. If there is no evidence of immunity, females who are pregnant should delay immunization until after pregnancy.  Pneumococcal 13-valent conjugate (PCV13) vaccine.** / Consult your health care provider.  Pneumococcal polysaccharide (PPSV23) vaccine.** / 1 to 2 doses if you smoke cigarettes or if you have certain conditions.  Meningococcal vaccine.** /  1 dose if you are age 19 to 21 years and a first-year college  student living in a residence hall, or have one of several medical conditions, you need to get vaccinated against meningococcal disease. You may also need additional booster doses.  Hepatitis A vaccine.** / Consult your health care provider.  Hepatitis B vaccine.** / Consult your health care provider.  Haemophilus influenzae type b (Hib) vaccine.** / Consult your health care provider. Ages 40 to 64 years  Blood pressure check.** / Every 1 to 2 years.  Lipid and cholesterol check.** / Every 5 years beginning at age 20 years.  Lung cancer screening. / Every year if you are aged 55-80 years and have a 30-pack-year history of smoking and currently smoke or have quit within the past 15 years. Yearly screening is stopped once you have quit smoking for at least 15 years or develop a health problem that would prevent you from having lung cancer treatment.  Clinical breast exam.** / Every year after age 40 years.  BRCA-related cancer risk assessment.** / For women who have family members with a BRCA-related cancer (breast, ovarian, tubal, or peritoneal cancers).  Mammogram.** / Every year beginning at age 40 years and continuing for as long as you are in good health. Consult with your health care provider.  Pap test.** / Every 3 years starting at age 30 years through age 65 or 70 years with a history of 3 consecutive normal Pap tests.  HPV screening.** / Every 3 years from ages 30 years through ages 65 to 70 years with a history of 3 consecutive normal Pap tests.  Fecal occult blood test (FOBT) of stool. / Every year beginning at age 50 years and continuing until age 75 years. You may not need to do this test if you get a colonoscopy every 10 years.  Flexible sigmoidoscopy or colonoscopy.** / Every 5 years for a flexible sigmoidoscopy or every 10 years for a colonoscopy beginning at age 50 years and continuing until age 75 years.  Hepatitis C blood test.** / For all people born from 1945 through  1965 and any individual with known risks for hepatitis C.  Skin self-exam. / Monthly.  Influenza vaccine. / Every year.  Tetanus, diphtheria, and acellular pertussis (Tdap/Td) vaccine.** / Consult your health care provider. Pregnant women should receive 1 dose of Tdap vaccine during each pregnancy. 1 dose of Td every 10 years.  Varicella vaccine.** / Consult your health care provider. Pregnant females who do not have evidence of immunity should receive the first dose after pregnancy.  Zoster vaccine.** / 1 dose for adults aged 60 years or older.  Measles, mumps, rubella (MMR) vaccine.** / You need at least 1 dose of MMR if you were born in 1957 or later. You may also need a 2nd dose. For females of childbearing age, rubella immunity should be determined. If there is no evidence of immunity, females who are not pregnant should be vaccinated. If there is no evidence of immunity, females who are pregnant should delay immunization until after pregnancy.  Pneumococcal 13-valent conjugate (PCV13) vaccine.** / Consult your health care provider.  Pneumococcal polysaccharide (PPSV23) vaccine.** / 1 to 2 doses if you smoke cigarettes or if you have certain conditions.  Meningococcal vaccine.** / Consult your health care provider.  Hepatitis A vaccine.** / Consult your health care provider.  Hepatitis B vaccine.** / Consult your health care provider.  Haemophilus influenzae type b (Hib) vaccine.** / Consult your health care provider. Ages 65   years and over  Blood pressure check.** / Every 1 to 2 years.  Lipid and cholesterol check.** / Every 5 years beginning at age 22 years.  Lung cancer screening. / Every year if you are aged 73-80 years and have a 30-pack-year history of smoking and currently smoke or have quit within the past 15 years. Yearly screening is stopped once you have quit smoking for at least 15 years or develop a health problem that would prevent you from having lung cancer  treatment.  Clinical breast exam.** / Every year after age 4 years.  BRCA-related cancer risk assessment.** / For women who have family members with a BRCA-related cancer (breast, ovarian, tubal, or peritoneal cancers).  Mammogram.** / Every year beginning at age 40 years and continuing for as long as you are in good health. Consult with your health care provider.  Pap test.** / Every 3 years starting at age 9 years through age 34 or 91 years with 3 consecutive normal Pap tests. Testing can be stopped between 65 and 70 years with 3 consecutive normal Pap tests and no abnormal Pap or HPV tests in the past 10 years.  HPV screening.** / Every 3 years from ages 57 years through ages 64 or 45 years with a history of 3 consecutive normal Pap tests. Testing can be stopped between 65 and 70 years with 3 consecutive normal Pap tests and no abnormal Pap or HPV tests in the past 10 years.  Fecal occult blood test (FOBT) of stool. / Every year beginning at age 15 years and continuing until age 17 years. You may not need to do this test if you get a colonoscopy every 10 years.  Flexible sigmoidoscopy or colonoscopy.** / Every 5 years for a flexible sigmoidoscopy or every 10 years for a colonoscopy beginning at age 86 years and continuing until age 71 years.  Hepatitis C blood test.** / For all people born from [birth year] through 1963 and any individual with known risks for hepatitis C.  Osteoporosis screening.** / A one-time screening for women ages 83 years and over and women at risk for fractures or osteoporosis.  Skin self-exam. / Monthly.  Influenza vaccine. / Every year.  Tetanus, diphtheria, and acellular pertussis (Tdap/Td) vaccine.** / 1 dose of Td every 10 years.  Varicella vaccine.** / Consult your health care provider.  Zoster vaccine.** / 1 dose for adults aged 61 years or older.  Pneumococcal 13-valent conjugate (PCV13) vaccine.** / Consult your health care provider.  Pneumococcal  polysaccharide (PPSV23) vaccine.** / 1 dose for all adults aged 28 years and older.  Meningococcal vaccine.** / Consult your health care provider.  Hepatitis A vaccine.** / Consult your health care provider.  Hepatitis B vaccine.** / Consult your health care provider.  Haemophilus influenzae type b (Hib) vaccine.** / Consult your health care provider. ** Family history and personal history of risk and conditions may change your health care provider's recommendations. Document Released: 01/29/2002 Document Revised: 04/19/2014 Document Reviewed: 04/30/2011 [facility] Patient Information 2015 [place], [place]. This information is not intended to replace advice given to you by your health care provider. Make sure you discuss any questions you have with your health care provider.

## 2015-01-17 LAB — CYTOLOGY - PAP

## 2015-07-07 ENCOUNTER — Ambulatory Visit
Admission: RE | Admit: 2015-07-07 | Discharge: 2015-07-07 | Disposition: A | Discharge status: Home / Self Care | Payer: BLUE CROSS/BLUE SHIELD | Source: Ambulatory Visit | Attending: Family Medicine | Admitting: Family Medicine

## 2015-07-07 DIAGNOSIS — Z01419 Encounter for gynecological examination (general) (routine) without abnormal findings: Secondary | ICD-10-CM

## 2016-01-18 ENCOUNTER — Telehealth: Payer: Self-pay | Admitting: *Deleted

## 2016-01-18 ENCOUNTER — Other Ambulatory Visit: Payer: Self-pay | Admitting: Family Medicine

## 2016-01-18 DIAGNOSIS — G4700 Insomnia, unspecified: Secondary | ICD-10-CM

## 2016-01-18 MED ORDER — DOXEPIN HCL 10 MG PO CAPS: 10.0000 mg | ORAL_CAPSULE | Freq: Every day | ORAL | Status: DC

## 2016-01-18 NOTE — Telephone Encounter (Signed)
Dr Kennon Rounds approved medication refill for Doxepin, sent prescription to pharmacy.

## 2016-01-18 NOTE — Telephone Encounter (Signed)
-----   Message from Francia Greaves sent at 01/17/2016  3:49 PM EST ----- Regarding: Rx Refill  Contact: [phone] Needs a refill on doxepin Uses [facility] [address] in [place]

## 2016-02-03 ENCOUNTER — Encounter: Payer: Self-pay | Admitting: Family Medicine

## 2016-02-03 ENCOUNTER — Ambulatory Visit (INDEPENDENT_AMBULATORY_CARE_PROVIDER_SITE_OTHER): Payer: 59 | Admitting: Family Medicine

## 2016-02-03 VITALS — BP 114/82 | HR 89 | Ht 68.0 in | Wt 213.6 lb

## 2016-02-03 DIAGNOSIS — Z01419 Encounter for gynecological examination (general) (routine) without abnormal findings: Secondary | ICD-10-CM | POA: Diagnosis not present

## 2016-02-03 DIAGNOSIS — K219 Gastro-esophageal reflux disease without esophagitis: Secondary | ICD-10-CM | POA: Diagnosis not present

## 2016-02-03 DIAGNOSIS — R5382 Chronic fatigue, unspecified: Secondary | ICD-10-CM | POA: Diagnosis not present

## 2016-02-03 DIAGNOSIS — Z0000 Encounter for general adult medical examination without abnormal findings: Secondary | ICD-10-CM

## 2016-02-03 DIAGNOSIS — Z1239 Encounter for other screening for malignant neoplasm of breast: Secondary | ICD-10-CM | POA: Diagnosis not present

## 2016-02-03 DIAGNOSIS — G4700 Insomnia, unspecified: Secondary | ICD-10-CM | POA: Diagnosis not present

## 2016-02-03 MED ORDER — OMEPRAZOLE 20 MG PO CPDR: 20.0000 mg | DELAYED_RELEASE_CAPSULE | Freq: Every day | ORAL | Status: DC

## 2016-02-03 MED ORDER — DOXEPIN HCL 10 MG PO CAPS: 20.0000 mg | ORAL_CAPSULE | Freq: Every day | ORAL | Status: DC

## 2016-02-03 NOTE — Patient Instructions (Addendum)
Preventive Care for Adults, Female A healthy lifestyle and preventive care can promote health and wellness. Preventive health guidelines for women include the following key practices.  A routine yearly physical is a good way to check with your health care provider about your health and preventive screening. It is a chance to share any concerns and updates on your health and to receive a thorough exam.  Visit your dentist for a routine exam and preventive care every 6 months. Brush your teeth twice a day and floss once a day. Good oral hygiene prevents tooth decay and gum disease.  The frequency of eye exams is based on your age, health, family medical history, use of contact lenses, and other factors. Follow your health care provider's recommendations for frequency of eye exams.  Eat a healthy diet. Foods like vegetables, fruits, whole grains, low-fat dairy products, and lean protein foods contain the nutrients you need without too many calories. Decrease your intake of foods high in solid fats, added sugars, and salt. Eat the right amount of calories for you.Get information about a proper diet from your health care provider, if necessary.  Regular physical exercise is one of the most important things you can do for your health. Most adults should get at least 150 minutes of moderate-intensity exercise (any activity that increases your heart rate and causes you to sweat) each week. In addition, most adults need muscle-strengthening exercises on 2 or more days a week.  Maintain a healthy weight. The body mass index (BMI) is a screening tool to identify possible weight problems. It provides an estimate of body fat based on height and weight. Your health care provider can find your BMI and can help you achieve or maintain a healthy weight.For adults 20 years and older:  A BMI below 18.5 is considered underweight.  A BMI of 18.5 to 24.9 is normal.  A BMI of 25 to 29.9 is considered overweight.  A  BMI of 30 and above is considered obese.  Maintain normal blood lipids and cholesterol levels by exercising and minimizing your intake of saturated fat. Eat a balanced diet with plenty of fruit and vegetables. Blood tests for lipids and cholesterol should begin at age 45 and be repeated every 5 years. If your lipid or cholesterol levels are high, you are over 50, or you are at high risk for heart disease, you may need your cholesterol levels checked more frequently.Ongoing high lipid and cholesterol levels should be treated with medicines if diet and exercise are not working.  If you smoke, find out from your health care provider how to quit. If you do not use tobacco, do not start.  Lung cancer screening is recommended for adults aged 45-80 years who are at high risk for developing lung cancer because of a history of smoking. A yearly low-dose CT scan of the lungs is recommended for people who have at least a 30-pack-year history of smoking and are a current smoker or have quit within the past 15 years. A pack year of smoking is smoking an average of 1 pack of cigarettes a day for 1 year (for example: 1 pack a day for 30 years or 2 packs a day for 15 years). Yearly screening should continue until the smoker has stopped smoking for at least 15 years. Yearly screening should be stopped for people who develop a health problem that would prevent them from having lung cancer treatment.  If you are pregnant, do not drink alcohol. If you are  breastfeeding, be very cautious about drinking alcohol. If you are not pregnant and choose to drink alcohol, do not have more than 1 drink per day. One drink is considered to be 12 ounces (355 mL) of beer, 5 ounces (148 mL) of wine, or 1.5 ounces (44 mL) of liquor.  Avoid use of street drugs. Do not share needles with anyone. Ask for help if you need support or instructions about stopping the use of drugs.  High blood pressure causes heart disease and increases the risk  of stroke. Your blood pressure should be checked at least every 1 to 2 years. Ongoing high blood pressure should be treated with medicines if weight loss and exercise do not work.  If you are 55-79 years old, ask your health care provider if you should take aspirin to prevent strokes.  Diabetes screening is done by taking a blood sample to check your blood glucose level after you have not eaten for a certain period of time (fasting). If you are not overweight and you do not have risk factors for diabetes, you should be screened once every 3 years starting at age 45. If you are overweight or obese and you are 40-70 years of age, you should be screened for diabetes every year as part of your cardiovascular risk assessment.  Breast cancer screening is essential preventive care for women. You should practice "breast self-awareness." This means understanding the normal appearance and feel of your breasts and may include breast self-examination. Any changes detected, no matter how small, should be reported to a health care provider. Women in their 20s and 30s should have a clinical breast exam (CBE) by a health care provider as part of a regular health exam every 1 to 3 years. After age 40, women should have a CBE every year. Starting at age 40, women should consider having a mammogram (breast X-ray test) every year. Women who have a family history of breast cancer should talk to their health care provider about genetic screening. Women at a high risk of breast cancer should talk to their health care providers about having an MRI and a mammogram every year.  Breast cancer gene (BRCA)-related cancer risk assessment is recommended for women who have family members with BRCA-related cancers. BRCA-related cancers include breast, ovarian, tubal, and peritoneal cancers. Having family members with these cancers may be associated with an increased risk for harmful changes (mutations) in the breast cancer genes BRCA1 and  BRCA2. Results of the assessment will determine the need for genetic counseling and BRCA1 and BRCA2 testing.  Your health care provider may recommend that you be screened regularly for cancer of the pelvic organs (ovaries, uterus, and vagina). This screening involves a pelvic examination, including checking for microscopic changes to the surface of your cervix (Pap test). You may be encouraged to have this screening done every 3 years, beginning at age 21.  For women ages 30-65, health care providers may recommend pelvic exams and Pap testing every 3 years, or they may recommend the Pap and pelvic exam, combined with testing for human papilloma virus (HPV), every 5 years. Some types of HPV increase your risk of cervical cancer. Testing for HPV may also be done on women of any age with unclear Pap test results.  Other health care providers may not recommend any screening for nonpregnant women who are considered low risk for pelvic cancer and who do not have symptoms. Ask your health care provider if a screening pelvic exam is right for   you.  If you have had past treatment for cervical cancer or a condition that could lead to cancer, you need Pap tests and screening for cancer for at least 20 years after your treatment. If Pap tests have been discontinued, your risk factors (such as having a new sexual partner) need to be reassessed to determine if screening should resume. Some women have medical problems that increase the chance of getting cervical cancer. In these cases, your health care provider may recommend more frequent screening and Pap tests.  Colorectal cancer can be detected and often prevented. Most routine colorectal cancer screening begins at the age of 50 years and continues through age 75 years. However, your health care provider may recommend screening at an earlier age if you have risk factors for colon cancer. On a yearly basis, your health care provider may provide home test kits to check  for hidden blood in the stool. Use of a small camera at the end of a tube, to directly examine the colon (sigmoidoscopy or colonoscopy), can detect the earliest forms of colorectal cancer. Talk to your health care provider about this at age 50, when routine screening begins. Direct exam of the colon should be repeated every 5-10 years through age 75 years, unless early forms of precancerous polyps or small growths are found.  People who are at an increased risk for hepatitis B should be screened for this virus. You are considered at high risk for hepatitis B if:  You were born in a country where hepatitis B occurs often. Talk with your health care provider about which countries are considered high risk.  Your parents were born in a high-risk country and you have not received a shot to protect against hepatitis B (hepatitis B vaccine).  You have HIV or AIDS.  You use needles to inject street drugs.  You live with, or have sex with, someone who has hepatitis B.  You get hemodialysis treatment.  You take certain medicines for conditions like cancer, organ transplantation, and autoimmune conditions.  Hepatitis C blood testing is recommended for all people born from 1945 through 1965 and any individual with known risks for hepatitis C.  Practice safe sex. Use condoms and avoid high-risk sexual practices to reduce the spread of sexually transmitted infections (STIs). STIs include gonorrhea, chlamydia, syphilis, trichomonas, herpes, HPV, and human immunodeficiency virus (HIV). Herpes, HIV, and HPV are viral illnesses that have no cure. They can result in disability, cancer, and death.  You should be screened for sexually transmitted illnesses (STIs) including gonorrhea and chlamydia if:  You are sexually active and are younger than 24 years.  You are older than 24 years and your health care provider tells you that you are at risk for this type of infection.  Your sexual activity has changed  since you were last screened and you are at an increased risk for chlamydia or gonorrhea. Ask your health care provider if you are at risk.  If you are at risk of being infected with HIV, it is recommended that you take a prescription medicine daily to prevent HIV infection. This is called preexposure prophylaxis (PrEP). You are considered at risk if:  You are sexually active and do not regularly use condoms or know the HIV status of your partner(s).  You take drugs by injection.  You are sexually active with a partner who has HIV.  Talk with your health care provider about whether you are at high risk of being infected with HIV. If   you choose to begin PrEP, you should first be tested for HIV. You should then be tested every 3 months for as long as you are taking PrEP.  Osteoporosis is a disease in which the bones lose minerals and strength with aging. This can result in serious bone fractures or breaks. The risk of osteoporosis can be identified using a bone density scan. Women ages 67 years and over and women at risk for fractures or osteoporosis should discuss screening with their health care providers. Ask your health care provider whether you should take a calcium supplement or vitamin D to reduce the rate of osteoporosis.  Menopause can be associated with physical symptoms and risks. Hormone replacement therapy is available to decrease symptoms and risks. You should talk to your health care provider about whether hormone replacement therapy is right for you.  Use sunscreen. Apply sunscreen liberally and repeatedly throughout the day. You should seek shade when your shadow is shorter than you. Protect yourself by wearing long sleeves, pants, a wide-brimmed hat, and sunglasses year round, whenever you are outdoors.  Once a month, do a whole body skin exam, using a mirror to look at the skin on your back. Tell your health care provider of new moles, moles that have irregular borders, moles that  are larger than a pencil eraser, or moles that have changed in shape or color.  Stay current with required vaccines (immunizations).  Influenza vaccine. All adults should be immunized every year.  Tetanus, diphtheria, and acellular pertussis (Td, Tdap) vaccine. Pregnant women should receive 1 dose of Tdap vaccine during each pregnancy. The dose should be obtained regardless of the length of time since the last dose. Immunization is preferred during the 27th-36th week of gestation. An adult who has not previously received Tdap or who does not know her vaccine status should receive 1 dose of Tdap. This initial dose should be followed by tetanus and diphtheria toxoids (Td) booster doses every 10 years. Adults with an unknown or incomplete history of completing a 3-dose immunization series with Td-containing vaccines should begin or complete a primary immunization series including a Tdap dose. Adults should receive a Td booster every 10 years.  Varicella vaccine. An adult without evidence of immunity to varicella should receive 2 doses or a second dose if she has previously received 1 dose. Pregnant females who do not have evidence of immunity should receive the first dose after pregnancy. This first dose should be obtained before leaving the health care facility. The second dose should be obtained 4-8 weeks after the first dose.  Human papillomavirus (HPV) vaccine. Females aged 13-26 years who have not received the vaccine previously should obtain the 3-dose series. The vaccine is not recommended for use in pregnant females. However, pregnancy testing is not needed before receiving a dose. If a female is found to be pregnant after receiving a dose, no treatment is needed. In that case, the remaining doses should be delayed until after the pregnancy. Immunization is recommended for any person with an immunocompromised condition through the age of 61 years if she did not get any or all doses earlier. During the  3-dose series, the second dose should be obtained 4-8 weeks after the first dose. The third dose should be obtained 24 weeks after the first dose and 16 weeks after the second dose.  Zoster vaccine. One dose is recommended for adults aged 30 years or older unless certain conditions are present.  Measles, mumps, and rubella (MMR) vaccine. Adults born  before 1957 generally are considered immune to measles and mumps. Adults born in 1957 or later should have 1 or more doses of MMR vaccine unless there is a contraindication to the vaccine or there is laboratory evidence of immunity to each of the three diseases. A routine second dose of MMR vaccine should be obtained at least 28 days after the first dose for students attending postsecondary schools, health care workers, or international travelers. People who received inactivated measles vaccine or an unknown type of measles vaccine during 1963-1967 should receive 2 doses of MMR vaccine. People who received inactivated mumps vaccine or an unknown type of mumps vaccine before 1979 and are at high risk for mumps infection should consider immunization with 2 doses of MMR vaccine. For females of childbearing age, rubella immunity should be determined. If there is no evidence of immunity, females who are not pregnant should be vaccinated. If there is no evidence of immunity, females who are pregnant should delay immunization until after pregnancy. Unvaccinated health care workers born before 1957 who lack laboratory evidence of measles, mumps, or rubella immunity or laboratory confirmation of disease should consider measles and mumps immunization with 2 doses of MMR vaccine or rubella immunization with 1 dose of MMR vaccine.  Pneumococcal 13-valent conjugate (PCV13) vaccine. When indicated, a person who is uncertain of his immunization history and has no record of immunization should receive the PCV13 vaccine. All adults 65 years of age and older should receive this  vaccine. An adult aged 19 years or older who has certain medical conditions and has not been previously immunized should receive 1 dose of PCV13 vaccine. This PCV13 should be followed with a dose of pneumococcal polysaccharide (PPSV23) vaccine. Adults who are at high risk for pneumococcal disease should obtain the PPSV23 vaccine at least 8 weeks after the dose of PCV13 vaccine. Adults older than 53 years of age who have normal immune system function should obtain the PPSV23 vaccine dose at least 1 year after the dose of PCV13 vaccine.  Pneumococcal polysaccharide (PPSV23) vaccine. When PCV13 is also indicated, PCV13 should be obtained first. All adults aged 65 years and older should be immunized. An adult younger than age 65 years who has certain medical conditions should be immunized. Any person who resides in a nursing home or long-term care facility should be immunized. An adult smoker should be immunized. People with an immunocompromised condition and certain other conditions should receive both PCV13 and PPSV23 vaccines. People with human immunodeficiency virus (HIV) infection should be immunized as soon as possible after diagnosis. Immunization during chemotherapy or radiation therapy should be avoided. Routine use of PPSV23 vaccine is not recommended for American Indians, [place] Natives, or people younger than 65 years unless there are medical conditions that require PPSV23 vaccine. When indicated, people who have unknown immunization and have no record of immunization should receive PPSV23 vaccine. One-time revaccination 5 years after the first dose of PPSV23 is recommended for people aged 19-64 years who have chronic kidney failure, nephrotic syndrome, asplenia, or immunocompromised conditions. People who received 1-2 doses of PPSV23 before age 65 years should receive another dose of PPSV23 vaccine at age 65 years or later if at least 5 years have passed since the previous dose. Doses of PPSV23 are not  needed for people immunized with PPSV23 at or after age 65 years.  Meningococcal vaccine. Adults with asplenia or persistent complement component deficiencies should receive 2 doses of quadrivalent meningococcal conjugate (MenACWY-D) vaccine. The doses should be obtained   at least 2 months apart. Microbiologists working with certain meningococcal bacteria, [place] recruits, people at risk during an outbreak, and people who travel to or live in countries with a high rate of meningitis should be immunized. A first-year college student up through age 34 years who is living in a residence hall should receive a dose if she did not receive a dose on or after her 16th birthday. Adults who have certain high-risk conditions should receive one or more doses of vaccine.  Hepatitis A vaccine. Adults who wish to be protected from this disease, have certain high-risk conditions, work with hepatitis A-infected animals, work in hepatitis A research labs, or travel to or work in countries with a high rate of hepatitis A should be immunized. Adults who were previously unvaccinated and who anticipate close contact with an international adoptee during the first 60 days after arrival in the [place] from a country with a high rate of hepatitis A should be immunized.  Hepatitis B vaccine. Adults who wish to be protected from this disease, have certain high-risk conditions, may be exposed to blood or other infectious body fluids, are household contacts or sex partners of hepatitis B positive people, are clients or workers in certain care facilities, or travel to or work in countries with a high rate of hepatitis B should be immunized.  Haemophilus influenzae type b (Hib) vaccine. A previously unvaccinated person with asplenia or sickle cell disease or having a scheduled splenectomy should receive 1 dose of Hib vaccine. Regardless of previous immunization, a recipient of a hematopoietic stem cell transplant should receive a  3-dose series 6-12 months after her successful transplant. Hib vaccine is not recommended for adults with HIV infection. Preventive Services / Frequency Ages 35 to 4 years  Blood pressure check.** / Every 3-5 years.  Lipid and cholesterol check.** / Every 5 years beginning at age 60.  Clinical breast exam.** / Every 3 years for women in their 71s and 10s.  BRCA-related cancer risk assessment.** / For women who have family members with a BRCA-related cancer (breast, ovarian, tubal, or peritoneal cancers).  Pap test.** / Every 2 years from ages 76 through 26. Every 3 years starting at age 61 through age 76 or 93 with a history of 3 consecutive normal Pap tests.  HPV screening.** / Every 3 years from ages 37 through ages 60 to 51 with a history of 3 consecutive normal Pap tests.  Hepatitis C blood test.** / For any individual with known risks for hepatitis C.  Skin self-exam. / Monthly.  Influenza vaccine. / Every year.  Tetanus, diphtheria, and acellular pertussis (Tdap, Td) vaccine.** / Consult your health care provider. Pregnant women should receive 1 dose of Tdap vaccine during each pregnancy. 1 dose of Td every 10 years.  Varicella vaccine.** / Consult your health care provider. Pregnant females who do not have evidence of immunity should receive the first dose after pregnancy.  HPV vaccine. / 3 doses over 6 months, if 93 and younger. The vaccine is not recommended for use in pregnant females. However, pregnancy testing is not needed before receiving a dose.  Measles, mumps, rubella (MMR) vaccine.** / You need at least 1 dose of MMR if you were born in 1957 or later. You may also need a 2nd dose. For females of childbearing age, rubella immunity should be determined. If there is no evidence of immunity, females who are not pregnant should be vaccinated. If there is no evidence of immunity, females who are  pregnant should delay immunization until after pregnancy.  Pneumococcal  13-valent conjugate (PCV13) vaccine.** / Consult your health care provider.  Pneumococcal polysaccharide (PPSV23) vaccine.** / 1 to 2 doses if you smoke cigarettes or if you have certain conditions.  Meningococcal vaccine.** / 1 dose if you are age 68 to 8 years and a Market researcher living in a residence hall, or have one of several medical conditions, you need to get vaccinated against meningococcal disease. You may also need additional booster doses.  Hepatitis A vaccine.** / Consult your health care provider.  Hepatitis B vaccine.** / Consult your health care provider.  Haemophilus influenzae type b (Hib) vaccine.** / Consult your health care provider. Ages 7 to 53 years  Blood pressure check.** / Every year.  Lipid and cholesterol check.** / Every 5 years beginning at age 25 years.  Lung cancer screening. / Every year if you are aged 11-80 years and have a 30-pack-year history of smoking and currently smoke or have quit within the past 15 years. Yearly screening is stopped once you have quit smoking for at least 15 years or develop a health problem that would prevent you from having lung cancer treatment.  Clinical breast exam.** / Every year after age 48 years.  BRCA-related cancer risk assessment.** / For women who have family members with a BRCA-related cancer (breast, ovarian, tubal, or peritoneal cancers).  Mammogram.** / Every year beginning at age 41 years and continuing for as long as you are in good health. Consult with your health care provider.  Pap test.** / Every 3 years starting at age 65 years through age 37 or 70 years with a history of 3 consecutive normal Pap tests.  HPV screening.** / Every 3 years from ages 72 years through ages 60 to 40 years with a history of 3 consecutive normal Pap tests.  Fecal occult blood test (FOBT) of stool. / Every year beginning at age 21 years and continuing until age 5 years. You may not need to do this test if you get  a colonoscopy every 10 years.  Flexible sigmoidoscopy or colonoscopy.** / Every 5 years for a flexible sigmoidoscopy or every 10 years for a colonoscopy beginning at age 35 years and continuing until age 48 years.  Hepatitis C blood test.** / For all people born from [birth year] through 1965 and any individual with known risks for hepatitis C.  Skin self-exam. / Monthly.  Influenza vaccine. / Every year.  Tetanus, diphtheria, and acellular pertussis (Tdap/Td) vaccine.** / Consult your health care provider. Pregnant women should receive 1 dose of Tdap vaccine during each pregnancy. 1 dose of Td every 10 years.  Varicella vaccine.** / Consult your health care provider. Pregnant females who do not have evidence of immunity should receive the first dose after pregnancy.  Zoster vaccine.** / 1 dose for adults aged 30 years or older.  Measles, mumps, rubella (MMR) vaccine.** / You need at least 1 dose of MMR if you were born in 1957 or later. You may also need a second dose. For females of childbearing age, rubella immunity should be determined. If there is no evidence of immunity, females who are not pregnant should be vaccinated. If there is no evidence of immunity, females who are pregnant should delay immunization until after pregnancy.  Pneumococcal 13-valent conjugate (PCV13) vaccine.** / Consult your health care provider.  Pneumococcal polysaccharide (PPSV23) vaccine.** / 1 to 2 doses if you smoke cigarettes or if you have certain conditions.  Meningococcal vaccine.** /  Consult your health care provider.  Hepatitis A vaccine.** / Consult your health care provider.  Hepatitis B vaccine.** / Consult your health care provider.  Haemophilus influenzae type b (Hib) vaccine.** / Consult your health care provider. Ages 25 years and over  Blood pressure check.** / Every year.  Lipid and cholesterol check.** / Every 5 years beginning at age 83 years.  Lung cancer screening. / Every year if you  are aged 7-80 years and have a 30-pack-year history of smoking and currently smoke or have quit within the past 15 years. Yearly screening is stopped once you have quit smoking for at least 15 years or develop a health problem that would prevent you from having lung cancer treatment.  Clinical breast exam.** / Every year after age 46 years.  BRCA-related cancer risk assessment.** / For women who have family members with a BRCA-related cancer (breast, ovarian, tubal, or peritoneal cancers).  Mammogram.** / Every year beginning at age 49 years and continuing for as long as you are in good health. Consult with your health care provider.  Pap test.** / Every 3 years starting at age 82 years through age 63 or 97 years with 3 consecutive normal Pap tests. Testing can be stopped between 65 and 70 years with 3 consecutive normal Pap tests and no abnormal Pap or HPV tests in the past 10 years.  HPV screening.** / Every 3 years from ages 67 years through ages 76 or 67 years with a history of 3 consecutive normal Pap tests. Testing can be stopped between 65 and 70 years with 3 consecutive normal Pap tests and no abnormal Pap or HPV tests in the past 10 years.  Fecal occult blood test (FOBT) of stool. / Every year beginning at age 4 years and continuing until age 29 years. You may not need to do this test if you get a colonoscopy every 10 years.  Flexible sigmoidoscopy or colonoscopy.** / Every 5 years for a flexible sigmoidoscopy or every 10 years for a colonoscopy beginning at age 92 years and continuing until age 47 years.  Hepatitis C blood test.** / For all people born from [birth year] through 1963 and any individual with known risks for hepatitis C.  Osteoporosis screening.** / A one-time screening for women ages 46 years and over and women at risk for fractures or osteoporosis.  Skin self-exam. / Monthly.  Influenza vaccine. / Every year.  Tetanus, diphtheria, and acellular pertussis (Tdap/Td)  vaccine.** / 1 dose of Td every 10 years.  Varicella vaccine.** / Consult your health care provider.  Zoster vaccine.** / 1 dose for adults aged 90 years or older.  Pneumococcal 13-valent conjugate (PCV13) vaccine.** / Consult your health care provider.  Pneumococcal polysaccharide (PPSV23) vaccine.** / 1 dose for all adults aged 96 years and older.  Meningococcal vaccine.** / Consult your health care provider.  Hepatitis A vaccine.** / Consult your health care provider.  Hepatitis B vaccine.** / Consult your health care provider.  Haemophilus influenzae type b (Hib) vaccine.** / Consult your health care provider. ** Family history and personal history of risk and conditions may change your health care provider's recommendations.   This information is not intended to replace advice given to you by your health care provider. Make sure you discuss any questions you have with your health care provider.   Document Released: 01/29/2002 Document Revised: 12/24/2014 Document Reviewed: 04/30/2011 Elsevier Interactive Patient Education 2016 [employer]. Menopause Menopause is the normal time of life when menstrual periods stop  completely. Menopause is complete when you have missed 12 consecutive menstrual periods. It usually occurs between the ages of 15 years and 109 years. Very rarely does a woman develop menopause before the age of 29 years. At menopause, your ovaries stop producing the female hormones estrogen and progesterone. This can cause undesirable symptoms and also affect your health. Sometimes the symptoms may occur 4-5 years before the menopause begins. There is no relationship between menopause and:  Oral contraceptives.  Number of children you had.  Race.  The age your menstrual periods started (menarche). Heavy smokers and very thin women may develop menopause earlier in life. CAUSES  The ovaries stop producing the female hormones estrogen and progesterone.  Other causes  include:  Surgery to remove both ovaries.  The ovaries stop functioning for no known reason.  Tumors of the pituitary gland in the brain.  Medical disease that affects the ovaries and hormone production.  Radiation treatment to the abdomen or pelvis.  Chemotherapy that affects the ovaries. SYMPTOMS   Hot flashes.  Night sweats.  Decrease in sex drive.  Vaginal dryness and thinning of the vagina causing painful intercourse.  Dryness of the skin and developing wrinkles.  Headaches.  Tiredness.  Irritability.  Memory problems.  Weight gain.  Bladder infections.  Hair growth of the face and chest.  Infertility. More serious symptoms include:  Loss of bone (osteoporosis) causing breaks (fractures).  Depression.  Hardening and narrowing of the arteries (atherosclerosis) causing heart attacks and strokes. DIAGNOSIS   When the menstrual periods have stopped for 12 straight months.  Physical exam.  Hormone studies of the blood. TREATMENT  There are many treatment choices and nearly as many questions about them. The decisions to treat or not to treat menopausal changes is an individual choice made with your health care provider. Your health care provider can discuss the treatments with you. Together, you can decide which treatment will work best for you. Your treatment choices may include:   Hormone therapy (estrogen and progesterone).  Non-hormonal medicines - (black cohash, soy estrogens).  Treating the individual symptoms with medicine (for example antidepressants for depression).  Herbal medicines that may help specific symptoms.  Counseling by a psychiatrist or psychologist.  Group therapy.  Lifestyle changes including:  Eating healthy.  Regular exercise.  Limiting caffeine and alcohol.  Stress management and meditation.  No treatment. HOME CARE INSTRUCTIONS   Take the medicine your health care provider gives you as directed.  Get plenty  of sleep and rest.  Exercise regularly.  Eat a diet that contains calcium (good for the bones) and soy products (acts like estrogen hormone).  Avoid alcoholic beverages.  Do not smoke.  If you have hot flashes, dress in layers.  Take supplements, calcium, and vitamin D to strengthen bones.  You can use over-the-counter lubricants or moisturizers for vaginal dryness.  Group therapy is sometimes very helpful.  Acupuncture may be helpful in some cases. SEEK MEDICAL CARE IF:   You are not sure you are in menopause.  You are having menopausal symptoms and need advice and treatment.  You are still having menstrual periods after age 15 years.  You have pain with intercourse.  Menopause is complete (no menstrual period for 12 months) and you develop vaginal bleeding.  You need a referral to a specialist (gynecologist, psychiatrist, or psychologist) for treatment. SEEK IMMEDIATE MEDICAL CARE IF:   You have severe depression.  You have excessive vaginal bleeding.  You fell and think you have  a broken bone.  You have pain when you urinate.  You develop leg or chest pain.  You have a fast pounding heart beat (palpitations).  You have severe headaches.  You develop vision problems.  You feel a lump in your breast.  You have abdominal pain or severe indigestion.   This information is not intended to replace advice given to you by your health care provider. Make sure you discuss any questions you have with your health care provider.   Document Released: 02/23/2004 Document Revised: 08/05/2013 Document Reviewed: 07/02/2013 Elsevier Interactive Patient Education [employer].

## 2016-02-03 NOTE — Progress Notes (Signed)
  Subjective:     [name] is a 55 y.o. female and is here for a comprehensive physical exam. The patient reports problems - fatigue and SOB with exertion. Feels like when she was anemic. Having hot flashes. Bladder support is good. S/p supracervical hysterectomy-normal pap and neg HPV last year. Normal mammoram 06/2015.Marland Kitchen  Social History   Social History  . Marital Status: Married    Spouse Name: N/A  . Number of Children: N/A  . Years of Education: N/A   Occupational History  . Not on file.   Social History Main Topics  . Smoking status: Former Smoker    Types: Cigarettes    Quit date: 01/28/2003  . Smokeless tobacco: Never Used  . Alcohol Use: Yes     Comment: socially  . Drug Use: No  . Sexual Activity:    Partners: Male    Birth Control/ Protection: Surgical   Other Topics Concern  . Not on file   Social History Narrative   Health Maintenance  Topic Date Due  . Hepatitis C Screening  02/[birth year]  . HIV Screening  08/09/1978  . TETANUS/TDAP  08/09/1982  . COLONOSCOPY  08/09/2013  . INFLUENZA VACCINE  07/18/2015  . MAMMOGRAM  07/06/2017  . PAP SMEAR  01/13/2018    The following portions of the patient's history were reviewed and updated as appropriate: allergies, current medications, past family history, past medical history, past social history, past surgical history and problem list.  Review of Systems Pertinent items noted in HPI and remainder of comprehensive ROS otherwise negative.   Objective:    BP 114/82 mmHg  Pulse 89  Ht 5\' 8"  (1.727 m)  Wt 213 lb 9.6 oz (96.888 kg)  BMI 32.49 kg/m2 General appearance: alert, cooperative and appears stated age Head: Normocephalic, without obvious abnormality, atraumatic Neck: no adenopathy, supple, symmetrical, trachea midline and thyroid not enlarged, symmetric, no tenderness/mass/nodules Lungs: clear to auscultation bilaterally Breasts: normal appearance, no masses or tenderness Heart: regular rate and  rhythm, S1, S2 normal, no murmur, click, rub or gallop Abdomen: soft, non-tender; bowel sounds normal; no masses,  no organomegaly Pelvic: cervix normal in appearance, external genitalia normal, no adnexal masses or tenderness, no cervical motion tenderness, rectovaginal septum normal, uterus surgically absent and vagina normal without discharge Extremities: extremities normal, atraumatic, no cyanosis or edema Pulses: 2+ and symmetric Skin: Skin color, texture, turgor normal. No rashes or lesions Lymph nodes: Cervical, supraclavicular, and axillary nodes normal. Neurologic: Grossly normal    Assessment/Plan:   1. Insomnia Will increase dose to 20 mg to see if this helps her stay asleep - doxepin (SINEQUAN) 10 MG capsule; Take 2 capsules (20 mg total) by mouth at bedtime.  Dispense: 60 capsule; Refill: 11  2. Gastroesophageal reflux disease without esophagitis - omeprazole (PRILOSEC) 20 MG capsule; Take 1 capsule (20 mg total) by mouth daily.  Dispense: 30 capsule; Refill: 11  3. Screening for breast cancer Has had flu and screening colonoscopy - MM DIGITAL SCREENING BILATERAL; Future  4. Encounter for general medical examination - Lipid panel - Comprehensive metabolic panel  5. Chronic fatigue If no improvement, she may need referral to pulmonary - TSH - CBC     See After Visit Summary for Counseling Recommendations

## 2016-02-04 LAB — COMPREHENSIVE METABOLIC PANEL
ALT: 30 U/L — ABNORMAL HIGH (ref 6–29)
AST: 19 U/L (ref 10–35)
Albumin: 4.1 g/dL (ref 3.6–5.1)
Alkaline Phosphatase: 71 U/L (ref 33–130)
BILIRUBIN TOTAL: 0.7 mg/dL (ref 0.2–1.2)
BUN: 13 mg/dL (ref 7–25)
CALCIUM: 9.5 mg/dL (ref 8.6–10.4)
CHLORIDE: 109 mmol/L (ref 98–110)
CO2: 24 mmol/L (ref 20–31)
Creat: 0.92 mg/dL (ref 0.50–1.05)
GLUCOSE: 82 mg/dL (ref 65–99)
Potassium: 3.9 mmol/L (ref 3.5–5.3)
Sodium: 141 mmol/L (ref 135–146)
Total Protein: 6.6 g/dL (ref 6.1–8.1)

## 2016-02-04 LAB — CBC
HEMATOCRIT: 45.4 % (ref 36.0–46.0)
Hemoglobin: 15.3 g/dL — ABNORMAL HIGH (ref 12.0–15.0)
MCH: 28.8 pg (ref 26.0–34.0)
MCHC: 33.7 g/dL (ref 30.0–36.0)
MCV: 85.5 fL (ref 78.0–100.0)
MPV: 11.3 fL (ref 8.6–12.4)
Platelets: 218 10*3/uL (ref 150–400)
RBC: 5.31 MIL/uL — ABNORMAL HIGH (ref 3.87–5.11)
RDW: 14.2 % (ref 11.5–15.5)
WBC: 6.2 10*3/uL (ref 4.0–10.5)

## 2016-02-04 LAB — LIPID PANEL
CHOL/HDL RATIO: 4.1 ratio (ref <=5.0)
Cholesterol: 171 mg/dL (ref 125–200)
HDL: 42 mg/dL — ABNORMAL LOW (ref >=46)
LDL Cholesterol: 105 mg/dL (ref <130)
Triglycerides: 122 mg/dL (ref <150)
VLDL: 24 mg/dL (ref <30)

## 2016-02-04 LAB — TSH: TSH: 1.44 m[IU]/L

## 2016-05-15 ENCOUNTER — Emergency Department (HOSPITAL_COMMUNITY): Payer: 59

## 2016-05-15 ENCOUNTER — Emergency Department (HOSPITAL_BASED_OUTPATIENT_CLINIC_OR_DEPARTMENT_OTHER): Payer: 59

## 2016-05-15 ENCOUNTER — Emergency Department (HOSPITAL_COMMUNITY)
Admission: EM | Admit: 2016-05-15 | Discharge: 2016-05-15 | Disposition: A | Discharge status: Home / Self Care | Payer: 59 | Attending: Emergency Medicine | Admitting: Emergency Medicine

## 2016-05-15 ENCOUNTER — Encounter (HOSPITAL_COMMUNITY): Payer: Self-pay | Admitting: Emergency Medicine

## 2016-05-15 DIAGNOSIS — Z79891 Long term (current) use of opiate analgesic: Secondary | ICD-10-CM | POA: Insufficient documentation

## 2016-05-15 DIAGNOSIS — M7989 Other specified soft tissue disorders: Secondary | ICD-10-CM | POA: Diagnosis not present

## 2016-05-15 DIAGNOSIS — Z87891 Personal history of nicotine dependence: Secondary | ICD-10-CM | POA: Diagnosis not present

## 2016-05-15 DIAGNOSIS — R609 Edema, unspecified: Secondary | ICD-10-CM

## 2016-05-15 DIAGNOSIS — R0602 Shortness of breath: Secondary | ICD-10-CM

## 2016-05-15 DIAGNOSIS — Z79899 Other long term (current) drug therapy: Secondary | ICD-10-CM | POA: Insufficient documentation

## 2016-05-15 LAB — CBC WITH DIFFERENTIAL/PLATELET
BASOS ABS: 0 10*3/uL (ref 0.0–0.1)
Basophils Relative: 0 %
Eosinophils Absolute: 0.1 10*3/uL (ref 0.0–0.7)
Eosinophils Relative: 1 %
HEMATOCRIT: 41.5 % (ref 36.0–46.0)
Hemoglobin: 13.9 g/dL (ref 12.0–15.0)
LYMPHS ABS: 1.6 10*3/uL (ref 0.7–4.0)
LYMPHS PCT: 29 %
MCH: 29 pg (ref 26.0–34.0)
MCHC: 33.5 g/dL (ref 30.0–36.0)
MCV: 86.5 fL (ref 78.0–100.0)
Monocytes Absolute: 0.3 10*3/uL (ref 0.1–1.0)
Monocytes Relative: 6 %
NEUTROS ABS: 3.6 10*3/uL (ref 1.7–7.7)
Neutrophils Relative %: 64 %
Platelets: 169 10*3/uL (ref 150–400)
RBC: 4.8 MIL/uL (ref 3.87–5.11)
RDW: 13.9 % (ref 11.5–15.5)
WBC: 5.5 10*3/uL (ref 4.0–10.5)

## 2016-05-15 LAB — COMPREHENSIVE METABOLIC PANEL
ALBUMIN: 4 g/dL (ref 3.5–5.0)
ALK PHOS: 72 U/L (ref 38–126)
ALT: 41 U/L (ref 14–54)
AST: 32 U/L (ref 15–41)
Anion gap: 5 (ref 5–15)
BILIRUBIN TOTAL: 0.4 mg/dL (ref 0.3–1.2)
BUN: 8 mg/dL (ref 6–20)
CALCIUM: 9 mg/dL (ref 8.9–10.3)
CO2: 26 mmol/L (ref 22–32)
CREATININE: 0.87 mg/dL (ref 0.44–1.00)
Chloride: 109 mmol/L (ref 101–111)
GFR calc Af Amer: >60 mL/min (ref >60)
GFR calc non Af Amer: >60 mL/min (ref >60)
GLUCOSE: 102 mg/dL — AB (ref 65–99)
POTASSIUM: 4.1 mmol/L (ref 3.5–5.1)
Sodium: 140 mmol/L (ref 135–145)
TOTAL PROTEIN: 6.7 g/dL (ref 6.5–8.1)

## 2016-05-15 LAB — D-DIMER, QUANTITATIVE: D-Dimer, Quant: 0.29 ug/mL-FEU (ref 0.00–0.50)

## 2016-05-15 NOTE — ED Notes (Signed)
VASCULAR US PRESENT

## 2016-05-15 NOTE — ED Notes (Signed)
Patient transported to X-ray 

## 2016-05-15 NOTE — ED Notes (Signed)
Bed: WA16 Expected date:  Expected time:  Means of arrival:  Comments: Hold for triage 1 

## 2016-05-15 NOTE — Discharge Instructions (Signed)
Follow-up with Dr. Kennon Rounds within 2 days to discuss your leg swelling and shortness of breath and to get a referral to a pulmonologist.   Return to the emergency department if you experience worsening shortness of breath, chest pain, dizziness, headaches, you pass out, unilateral leg swelling, calf swelling redness pain and warmth.  Shortness of Breath Shortness of breath means you have trouble breathing. It could also mean that you have a medical problem. You should get immediate medical care for shortness of breath. CAUSES   Not enough oxygen in the air such as with high altitudes or a smoke-filled room.  Certain lung diseases, infections, or problems.  Heart disease or conditions, such as angina or heart failure.  Low red blood cells (anemia).  Poor physical fitness, which can cause shortness of breath when you exercise.  Chest or back injuries or stiffness.  Being overweight.  Smoking.  Anxiety, which can make you feel like you are not getting enough air. DIAGNOSIS  Serious medical problems can often be found during your physical exam. Tests may also be done to determine why you are having shortness of breath. Tests may include:  Chest X-rays.  Lung function tests.  Blood tests.  An electrocardiogram (ECG).  An ambulatory electrocardiogram. An ambulatory ECG records your heartbeat patterns over a 24-hour period.  Exercise testing.  A transthoracic echocardiogram (TTE). During echocardiography, sound waves are used to evaluate how blood flows through your heart.  A transesophageal echocardiogram (TEE).  Imaging scans. Your health care provider may not be able to find a cause for your shortness of breath after your exam. In this case, it is important to have a follow-up exam with your health care provider as directed.  TREATMENT  Treatment for shortness of breath depends on the cause of your symptoms and can vary greatly. HOME CARE INSTRUCTIONS   Do not smoke.  Smoking is a common cause of shortness of breath. If you smoke, ask for help to quit.  Avoid being around chemicals or things that may bother your breathing, such as paint fumes and dust.  Rest as needed. Slowly resume your usual activities.  If medicines were prescribed, take them as directed for the full length of time directed. This includes oxygen and any inhaled medicines.  Keep all follow-up appointments as directed by your health care provider. SEEK MEDICAL CARE IF:   Your condition does not improve in the time expected.  You have a hard time doing your normal activities even with rest.  You have any new symptoms. SEEK IMMEDIATE MEDICAL CARE IF:   Your shortness of breath gets worse.  You feel light-headed, faint, or develop a cough not controlled with medicines.  You start coughing up blood.  You have pain with breathing.  You have chest pain or pain in your arms, shoulders, or abdomen.  You have a fever.  You are unable to walk up stairs or exercise the way you normally do. MAKE SURE YOU:  Understand these instructions.  Will watch your condition.  Will get help right away if you are not doing well or get worse.   This information is not intended to replace advice given to you by your health care provider. Make sure you discuss any questions you have with your health care provider.   Document Released: 08/28/2001 Document Revised: 12/08/2013 Document Reviewed: 02/18/2012 Elsevier Interactive Patient Education 2016 [employer]   Edema Edema is an abnormal buildup of fluids in your bodytissues. Edema is somewhatdependent on gravity  to pull the fluid to the lowest place in your body. That makes the condition more common in the legs and thighs (lower extremities). Painless swelling of the feet and ankles is common and becomes more likely as you get older. It is also common in looser tissues, like around your eyes.  When the affected area is squeezed, the fluid  may move out of that spot and leave a dent for a few moments. This dent is called pitting.  CAUSES  There are many possible causes of edema. Eating too much salt and being on your feet or sitting for a long time can cause edema in your legs and ankles. Hot weather may make edema worse. Common medical causes of edema include:  Heart failure.  Liver disease.  Kidney disease.  Weak blood vessels in your legs.  Cancer.  An injury.  Pregnancy.  Some medications.  Obesity. SYMPTOMS  Edema is usually painless.Your skin may look swollen or shiny.  DIAGNOSIS  Your health care provider may be able to diagnose edema by asking about your medical history and doing a physical exam. You may need to have tests such as X-rays, an electrocardiogram, or blood tests to check for medical conditions that may cause edema.  TREATMENT  Edema treatment depends on the cause. If you have heart, liver, or kidney disease, you need the treatment appropriate for these conditions. General treatment may include:  Elevation of the affected body part above the level of your heart.  Compression of the affected body part. Pressure from elastic bandages or support stockings squeezes the tissues and forces fluid back into the blood vessels. This keeps fluid from entering the tissues.  Restriction of fluid and salt intake.  Use of a water pill (diuretic). These medications are appropriate only for some types of edema. They pull fluid out of your body and make you urinate more often. This gets rid of fluid and reduces swelling, but diuretics can have side effects. Only use diuretics as directed by your health care provider. HOME CARE INSTRUCTIONS   Keep the affected body part above the level of your heart when you are lying down.   Do not sit still or stand for prolonged periods.   Do not put anything directly under your knees when lying down.  Do not wear constricting clothing or garters on your upper legs.    Exercise your legs to work the fluid back into your blood vessels. This may help the swelling go down.   Wear elastic bandages or support stockings to reduce ankle swelling as directed by your health care provider.   Eat a low-salt diet to reduce fluid if your health care provider recommends it.   Only take medicines as directed by your health care provider. SEEK MEDICAL CARE IF:   Your edema is not responding to treatment.  You have heart, liver, or kidney disease and notice symptoms of edema.  You have edema in your legs that does not improve after elevating them.   You have sudden and unexplained weight gain. SEEK IMMEDIATE MEDICAL CARE IF:   You develop shortness of breath or chest pain.   You cannot breathe when you lie down.  You develop pain, redness, or warmth in the swollen areas.   You have heart, liver, or kidney disease and suddenly get edema.  You have a fever and your symptoms suddenly get worse. MAKE SURE YOU:   Understand these instructions.  Will watch your condition.  Will get help right  away if you are not doing well or get worse.   This information is not intended to replace advice given to you by your health care provider. Make sure you discuss any questions you have with your health care provider.   Document Released: 12/03/2005 Document Revised: 12/24/2014 Document Reviewed: 09/25/2013 Elsevier Interactive Patient Education [employer].

## 2016-05-15 NOTE — ED Notes (Signed)
Per pt, states B/L lower extremity swelling for about a month-normally resolves in am but now it is consistent throughout the day

## 2016-05-15 NOTE — ED Provider Notes (Signed)
CSN: [id number]     Arrival date & time 05/15/16  [id number] History   First MD Initiated Contact with Patient 05/15/16 0957     Chief Complaint  Patient presents with  . Foot Swelling     (Consider location/radiation/quality/duration/timing/severity/associated sxs/prior Treatment) HPI   Patient is a 55-year-old female with a history of GERD, DJD, arthritis who presents the ED with bilateral leg swelling right greater than left and increased shortness of breath for several months. Patient states she returned from a beach yesterday and noticed edema in her bilateral legs. She states intermittent mild swelling in her feet and lower legs but is never been this bad. She noticed shortness of breath on exertion while at the beach but states shortness of breath for a few months on exertion and worse when carrying items. She states that she used to to walk to her other office location with ease but has noticed increased SOB. She endorses bilateral lower achy legs but denies calf pain. She denies chest pain, fever, chills, numbness/tingling, weakness, headache. Patient states she does not have increased shortness of breath with lying flat.  Past Medical History  Diagnosis Date  . Obesity   . GERD (gastroesophageal reflux disease)   . Genuine stress incontinence, female 2012    patient had surgical repair  . Arthritis   . Degenerative disorder of bone    Past Surgical History  Procedure Laterality Date  . Laparoscopic hysterectomy  10/23/2010    mennorhagia/fibroids  . Midurethral sling  05/02/2011  . Excision of vulvar lesion  05/02/2011   Family History  Problem Relation Age of Onset  . Heart attack Mother   . Hypertension Mother    Social History  Substance Use Topics  . Smoking status: Former Smoker    Types: Cigarettes    Quit date: 01/28/2003  . Smokeless tobacco: Never Used  . Alcohol Use: Yes     Comment: socially   OB History    Gravida Para Term Preterm AB TAB SAB Ectopic  Multiple Living   2 2        2      Review of Systems  Constitutional: Negative for fever and chills.  HENT: Negative for facial swelling and trouble swallowing.   Eyes: Negative for visual disturbance.  Respiratory: Positive for shortness of breath. Negative for cough and chest tightness.   Cardiovascular: Positive for leg swelling. Negative for chest pain.  Gastrointestinal: Negative for nausea, vomiting, abdominal pain and diarrhea.  Genitourinary: Negative for dysuria and hematuria.  Musculoskeletal: Negative for neck pain and neck stiffness.  Skin: Negative for rash.  Neurological: Negative for dizziness, syncope, weakness, numbness and headaches.  Hematological: Does not bruise/bleed easily.      Allergies  Review of patient's allergies indicates no known allergies.  Home Medications   Prior to Admission medications   Medication Sig Start Date End Date Taking? Authorizing Provider  doxepin (SINEQUAN) 10 MG capsule Take 2 capsules (20 mg total) by mouth at bedtime. Patient taking differently: Take 10 mg by mouth at bedtime.  02/03/16  Yes Donnamae Jude, MD  HYDROcodone-acetaminophen (NORCO/VICODIN) 5-325 MG per tablet Take 1-2 tablets by mouth every 4 (four) hours as needed for moderate pain or severe pain. 12/04/14  Yes Clayton Bibles, PA-C  Melatonin 5 MG TABS Take 10 mg by mouth at bedtime.   Yes Historical Provider, MD  meloxicam (MOBIC) 15 MG tablet Take 15 mg by mouth daily.  01/18/16  Yes Historical Provider, MD  omeprazole (  PRILOSEC) 20 MG capsule Take 1 capsule (20 mg total) by mouth daily. 02/03/16  Yes Donnamae Jude, MD   BP 128/85 mmHg  Pulse 57  Temp(Src) 98.6 F (37 C) (Oral)  Resp 13  SpO2 99% Physical Exam  Constitutional: She is oriented to person, place, and time. She appears well-developed and well-nourished. No distress.  HENT:  Head: Normocephalic and atraumatic.  Eyes: Conjunctivae are normal.  Neck: Normal range of motion. Neck supple.  Cardiovascular:  Normal rate, regular rhythm and normal heart sounds.  Exam reveals no gallop and no friction rub.   No murmur heard. Pulses:      Radial pulses are 2+ on the right side, and 2+ on the left side.       Dorsalis pedis pulses are 2+ on the right side, and 2+ on the left side.  Pulmonary/Chest: Effort normal and breath sounds normal. No respiratory distress. She has no wheezes. She has no rales.  Abdominal: Soft. There is no tenderness. There is no rebound and no guarding.  Musculoskeletal: Normal range of motion. She exhibits edema.  2+ pitting edema to bilateral lower extremities, right greater than left.  Neurological: She is alert and oriented to person, place, and time. Coordination normal.  Skin: Skin is warm and dry. No rash noted. She is not diaphoretic.  Psychiatric: She has a normal mood and affect. Her behavior is normal.    ED Course  Procedures (including critical care time) Labs Review Labs Reviewed  COMPREHENSIVE METABOLIC PANEL - Abnormal; Notable for the following:    Glucose, Bld 102 (*)    All other components within normal limits  CBC WITH DIFFERENTIAL/PLATELET  D-DIMER, QUANTITATIVE (NOT AT [facility])    Imaging Review Dg Chest 2 View  05/15/2016  CLINICAL DATA:  Shortness of breath. EXAM: CHEST  2 VIEW COMPARISON:  11/23/2008. FINDINGS: Mediastinum hilar structures are normal. Heart size normal. No focal infiltrate. Tiny calcified nodule right apex appears stable and is most likely a granuloma. It No pleural effusion or pneumothorax. Degenerative changes thoracic spine. IMPRESSION: No acute cardiopulmonary disease. Electronically Signed   By: Marcello Moores  Register   On: 05/15/2016 12:13   I have personally reviewed and evaluated these images and lab results as part of my medical decision-making.   EKG Interpretation   Date/Time:  Tuesday May 15 2016 12:14:18 EDT Ventricular Rate:  64 PR Interval:  118 QRS Duration: 86 QT Interval:  424 QTC Calculation: 437 R Axis:    56 Text Interpretation:  Sinus rhythm Borderline short PR interval Low  voltage, precordial leads Baseline wander in lead(s) V5 Confirmed by Jeneen Rinks   MD, Preston ([id number]) on 05/15/2016 2:13:41 PM      MDM   Final diagnoses:  Right leg swelling  Left leg swelling  Shortness of breath   Patient is to be discharged with recommendation to follow up with PCP in regards to today's hospital visit. SOB unlikely of cardiac or pulmonary etiology d/t presentation, VSS, no tracheal deviation, no JVD or new murmur, RRR, breath sounds equal bilaterally, EKG without acute abnormalities, negative D-dimer, and negative CXR. Pt has been advised to return to the ED if SOB worsens, is associated with diaphoresis, nausea, or chest pain or becomes concerning in any way.  Instructed pt to follow up with her PCP regarding her SOB and leg swelling and to obtain a pulmonology referral. Pt has spoken to her PCP about her SOB and they were going to do a pulmonology referral but it  did not happen.  Discussed DVT signs and symptoms and when to return to the ED. Her leg swelling is likely dependent edema and I discussed compression sock and ways to prevent swelling. Pt appears reliable for follow up and is agreeable to discharge.  Case has been discussed with and seen by Dr. Jeneen Rinks who agrees with the above plan to discharge.      Kalman Drape, PA 05/15/16 [place], MD 05/24/16 [id number]

## 2016-05-15 NOTE — Progress Notes (Signed)
VASCULAR LAB PRELIMINARY  PRELIMINARY  PRELIMINARY  PRELIMINARY  Bilateral lower extremity venous duplex completed.    Preliminary report:  Bilateral:  No evidence of DVT, superficial thrombosis, or Baker's Cyst.   [name], RVS 05/15/2016, 12:01 PM

## 2016-09-14 ENCOUNTER — Ambulatory Visit
Admission: RE | Admit: 2016-09-14 | Discharge: 2016-09-14 | Disposition: A | Discharge status: Home / Self Care | Payer: 59 | Source: Ambulatory Visit | Attending: Family Medicine | Admitting: Family Medicine

## 2016-09-14 DIAGNOSIS — Z1239 Encounter for other screening for malignant neoplasm of breast: Secondary | ICD-10-CM

## 2017-02-13 ENCOUNTER — Other Ambulatory Visit: Payer: Self-pay | Admitting: Family Medicine

## 2017-02-13 DIAGNOSIS — G4700 Insomnia, unspecified: Secondary | ICD-10-CM

## 2017-02-18 ENCOUNTER — Telehealth: Payer: Self-pay | Admitting: Radiology

## 2017-02-18 NOTE — Telephone Encounter (Signed)
Called and left a message on the pt cell phone to call office back to schedule appointment. Pt left message with on call that he would like to make appointment

## 2017-03-26 ENCOUNTER — Encounter: Payer: Self-pay | Admitting: Family Medicine

## 2017-03-26 ENCOUNTER — Ambulatory Visit (INDEPENDENT_AMBULATORY_CARE_PROVIDER_SITE_OTHER): Payer: 59 | Admitting: Family Medicine

## 2017-03-26 VITALS — BP 133/90 | HR 90 | Resp 20 | Ht 67.75 in | Wt 218.0 lb

## 2017-03-26 DIAGNOSIS — G4709 Other insomnia: Secondary | ICD-10-CM

## 2017-03-26 DIAGNOSIS — Z01419 Encounter for gynecological examination (general) (routine) without abnormal findings: Secondary | ICD-10-CM

## 2017-03-26 MED ORDER — DEXLANSOPRAZOLE 30 MG PO CPDR: 30.0000 mg | DELAYED_RELEASE_CAPSULE | Freq: Every day | ORAL | 11 refills | Status: DC

## 2017-03-26 MED ORDER — DOXEPIN HCL 10 MG PO CAPS: 20.0000 mg | ORAL_CAPSULE | Freq: Every day | ORAL | 11 refills | Status: DC

## 2017-03-26 NOTE — Progress Notes (Signed)
MM 08/2016 - normal

## 2017-03-26 NOTE — Progress Notes (Signed)
  Subjective:     [name] is a 56 y.o. female and is here for a comprehensive physical exam. The patient reports no problems.Felt a lump in her vulva but it has now resolved. No other issues. S/p lap supracervical hysterectomy.  Social History   Social History  . Marital status: Married    Spouse name: N/A  . Number of children: N/A  . Years of education: N/A   Occupational History  . Not on file.   Social History Main Topics  . Smoking status: Former Smoker    Types: Cigarettes    Quit date: 01/28/2003  . Smokeless tobacco: Never Used  . Alcohol use Yes     Comment: socially  . Drug use: No  . Sexual activity: Yes    Partners: Male    Birth control/ protection: Surgical   Other Topics Concern  . Not on file   Social History Narrative  . No narrative on file   Health Maintenance  Topic Date Due  . Hepatitis C Screening  January 12, 1961  . HIV Screening  08/09/1978  . TETANUS/TDAP  08/09/1982  . INFLUENZA VACCINE  07/17/2017  . PAP SMEAR  01/13/2018  . MAMMOGRAM  09/14/2018  . COLONOSCOPY  05/19/2019    The following portions of the patient's history were reviewed and updated as appropriate: allergies, current medications, past family history, past medical history, past social history, past surgical history and problem list.  Review of Systems Pertinent items noted in HPI and remainder of comprehensive ROS otherwise negative.   Objective:    BP 133/90 (BP Location: Left Arm, Patient Position: Sitting, Cuff Size: Large)   Pulse 90   Resp 20   Ht 5' 7.75" (1.721 m)   Wt 218 lb (98.9 kg)   BMI 33.39 kg/m  General appearance: alert, cooperative and appears stated age Head: Normocephalic, without obvious abnormality, atraumatic Neck: no adenopathy, supple, symmetrical, trachea midline and thyroid not enlarged, symmetric, no tenderness/mass/nodules Lungs: clear to auscultation bilaterally Breasts: normal appearance, no masses or tenderness Heart: regular  rate and rhythm, S1, S2 normal, no murmur, click, rub or gallop Abdomen: soft, non-tender; bowel sounds normal; no masses,  no organomegaly Pelvic: external genitalia normal, no adnexal masses or tenderness, uterus surgically absent and vagina normal without discharge Extremities: Homans sign is negative, no sign of DVT Pulses: 2+ and symmetric Skin: Skin color, texture, turgor normal. No rashes or lesions Lymph nodes: Cervical, supraclavicular, and axillary nodes normal. Neurologic: Grossly normal    Assessment:    Healthy female exam.      Plan:      Problem List Items Addressed This Visit      Unprioritized   Insomnia   Relevant Medications   doxepin (SINEQUAN) 10 MG capsule    Other Visit Diagnoses    Encounter for gynecological examination without abnormal finding    -  Primary   Relevant Medications   Dexlansoprazole (DEXILANT) 30 MG capsule   Other Relevant Orders   CBC   Comprehensive metabolic panel   TSH   Lipid panel      See After Visit Summary for Counseling Recommendations

## 2017-03-27 LAB — CBC
HEMATOCRIT: 42.6 % (ref 34.0–46.6)
Hemoglobin: 14.8 g/dL (ref 11.1–15.9)
MCH: 28.7 pg (ref 26.6–33.0)
MCHC: 34.7 g/dL (ref 31.5–35.7)
MCV: 83 fL (ref 79–97)
Platelets: 218 10*3/uL (ref 150–379)
RBC: 5.16 x10E6/uL (ref 3.77–5.28)
RDW: 14.2 % (ref 12.3–15.4)
WBC: 5.8 10*3/uL (ref 3.4–10.8)

## 2017-03-27 LAB — COMPREHENSIVE METABOLIC PANEL
A/G RATIO: 1.8 (ref 1.2–2.2)
ALK PHOS: 83 IU/L (ref 39–117)
ALT: 33 IU/L — ABNORMAL HIGH (ref 0–32)
AST: 23 IU/L (ref 0–40)
Albumin: 4.2 g/dL (ref 3.5–5.5)
BILIRUBIN TOTAL: 0.4 mg/dL (ref 0.0–1.2)
BUN / CREAT RATIO: 11 (ref 9–23)
BUN: 10 mg/dL (ref 6–24)
CO2: 18 mmol/L (ref 18–29)
Calcium: 9.7 mg/dL (ref 8.7–10.2)
Chloride: 107 mmol/L — ABNORMAL HIGH (ref 96–106)
Creatinine, Ser: 0.93 mg/dL (ref 0.57–1.00)
GFR calc Af Amer: 81 mL/min/{1.73_m2} (ref >59)
GFR calc non Af Amer: 70 mL/min/{1.73_m2} (ref >59)
GLOBULIN, TOTAL: 2.4 g/dL (ref 1.5–4.5)
Glucose: 83 mg/dL (ref 65–99)
POTASSIUM: 4.2 mmol/L (ref 3.5–5.2)
SODIUM: 144 mmol/L (ref 134–144)
Total Protein: 6.6 g/dL (ref 6.0–8.5)

## 2017-03-27 LAB — LIPID PANEL
CHOLESTEROL TOTAL: 181 mg/dL (ref 100–199)
Chol/HDL Ratio: 4.1 ratio (ref 0.0–4.4)
HDL: 44 mg/dL (ref >39)
LDL Calculated: 106 mg/dL — ABNORMAL HIGH (ref 0–99)
Triglycerides: 154 mg/dL — ABNORMAL HIGH (ref 0–149)
VLDL Cholesterol Cal: 31 mg/dL (ref 5–40)

## 2017-03-27 LAB — TSH: TSH: 1.48 u[IU]/mL (ref 0.450–4.500)

## 2018-01-02 DIAGNOSIS — Z79891 Long term (current) use of opiate analgesic: Secondary | ICD-10-CM

## 2018-01-02 HISTORY — DX: Long term (current) use of opiate analgesic: Z79.891

## 2018-02-17 ENCOUNTER — Encounter: Payer: Self-pay | Admitting: Radiology

## 2018-06-16 ENCOUNTER — Ambulatory Visit (INDEPENDENT_AMBULATORY_CARE_PROVIDER_SITE_OTHER): Payer: 59 | Admitting: Family Medicine

## 2018-06-16 ENCOUNTER — Encounter: Payer: Self-pay | Admitting: Family Medicine

## 2018-06-16 DIAGNOSIS — Z780 Asymptomatic menopausal state: Secondary | ICD-10-CM

## 2018-06-16 DIAGNOSIS — Z1151 Encounter for screening for human papillomavirus (HPV): Secondary | ICD-10-CM | POA: Diagnosis not present

## 2018-06-16 DIAGNOSIS — Z124 Encounter for screening for malignant neoplasm of cervix: Secondary | ICD-10-CM

## 2018-06-16 DIAGNOSIS — Z01419 Encounter for gynecological examination (general) (routine) without abnormal findings: Secondary | ICD-10-CM | POA: Diagnosis not present

## 2018-06-16 DIAGNOSIS — Z23 Encounter for immunization: Secondary | ICD-10-CM | POA: Diagnosis not present

## 2018-06-16 DIAGNOSIS — Z01411 Encounter for gynecological examination (general) (routine) with abnormal findings: Secondary | ICD-10-CM

## 2018-06-16 HISTORY — DX: Asymptomatic menopausal state: Z78.0

## 2018-06-16 NOTE — Assessment & Plan Note (Signed)
Options for treatment of menopause reviewed in detail. Would not need progesterone as she has no uterus. Risks of clots, strokes, breast CA, reviewed. She will try herbal and lifestyle changes to start. If not working, consider transdermal estrogen.

## 2018-06-16 NOTE — Progress Notes (Signed)
Pt presents for annual wants pap, c/o hot flashes, and requests to discuss issues w/ bladder tact. MGM due.

## 2018-06-16 NOTE — Patient Instructions (Addendum)
Preventive Care 40-64 Years, Female Preventive care refers to lifestyle choices and visits with your health care provider that can promote health and wellness. What does preventive care include?  A yearly physical exam. This is also called an annual well check.  Dental exams once or twice a year.  Routine eye exams. Ask your health care provider how often you should have your eyes checked.  Personal lifestyle choices, including: ? Daily care of your teeth and gums. ? Regular physical activity. ? Eating a healthy diet. ? Avoiding tobacco and drug use. ? Limiting alcohol use. ? Practicing safe sex. ? Taking low-dose aspirin daily starting at age 55. ? Taking vitamin and mineral supplements as recommended by your health care provider. What happens during an annual well check? The services and screenings done by your health care provider during your annual well check will depend on your age, overall health, lifestyle risk factors, and family history of disease. Counseling Your health care provider may ask you questions about your:  Alcohol use.  Tobacco use.  Drug use.  Emotional well-being.  Home and relationship well-being.  Sexual activity.  Eating habits.  Work and work Statistician.  Method of birth control.  Menstrual cycle.  Pregnancy history.  Screening You may have the following tests or measurements:  Height, weight, and BMI.  Blood pressure.  Lipid and cholesterol levels. These may be checked every 5 years, or more frequently if you are over 55 years old.  Skin check.  Lung cancer screening. You may have this screening every year starting at age 55 if you have a 30-pack-year history of smoking and currently smoke or have quit within the past 15 years.  Fecal occult blood test (FOBT) of the stool. You may have this test every year starting at age 55.  Flexible sigmoidoscopy or colonoscopy. You may have a sigmoidoscopy every 5 years or a colonoscopy  every 10 years starting at age 55.  Hepatitis C blood test.  Hepatitis B blood test.  Sexually transmitted disease (STD) testing.  Diabetes screening. This is done by checking your blood sugar (glucose) after you have not eaten for a while (fasting). You may have this done every 1-3 years.  Mammogram. This may be done every 55 years. Talk to your health care provider about when you should start having regular mammograms. This may depend on whether you have a family history of breast cancer.  BRCA-related cancer screening. This may be done if you have a family history of breast, ovarian, tubal, or peritoneal cancers.  Pelvic exam and Pap test. This may be done every 3 years starting at age 55. Starting at age 36, this may be done every 5 years if you have a Pap test in combination with an HPV test.  Bone density scan. This is done to screen for osteoporosis. You may have this scan if you are at high risk for osteoporosis.  Discuss your test results, treatment options, and if necessary, the need for more tests with your health care provider. Vaccines Your health care provider may recommend certain vaccines, such as:  Influenza vaccine. This is recommended every year.  Tetanus, diphtheria, and acellular pertussis (Tdap, Td) vaccine. You may need a Td booster every 10 years.  Varicella vaccine. You may need this if you have not been vaccinated.  Zoster vaccine. You may need this after age 55.  Measles, mumps, and rubella (MMR) vaccine. You may need at least one dose of MMR if you were born in  1955 or later. You may also need a second dose.  Pneumococcal 13-valent conjugate (PCV13) vaccine. You may need this if you have certain conditions and were not previously vaccinated.  Pneumococcal polysaccharide (PPSV23) vaccine. You may need one or two doses if you smoke cigarettes or if you have certain conditions.  Meningococcal vaccine. You may need this if you have certain  conditions.  Hepatitis A vaccine. You may need this if you have certain conditions or if you travel or work in places where you may be exposed to hepatitis A.  Hepatitis B vaccine. You may need this if you have certain conditions or if you travel or work in places where you may be exposed to hepatitis B.  Haemophilus influenzae type b (Hib) vaccine. You may need this if you have certain conditions.  Talk to your health care provider about which screenings and vaccines you need and how often you need them. This information is not intended to replace advice given to you by your health care provider. Make sure you discuss any questions you have with your health care provider. Document Released: 12/30/2015 Document Revised: 08/22/2016 Document Reviewed: 10/04/2015 Elsevier Interactive Patient Education  2018 [employer]. Menopause Menopause is the normal time of life when menstrual periods stop completely. Menopause is complete when you have missed 12 consecutive menstrual periods. It usually occurs between the ages of 55 years and 55 years. Very rarely does a woman develop menopause before the age of 55 years. At menopause, your ovaries stop producing the female hormones estrogen and progesterone. This can cause undesirable symptoms and also affect your health. Sometimes the symptoms may occur 4-5 years before the menopause begins. There is no relationship between menopause and:  Oral contraceptives.  Number of children you had.  Race.  The age your menstrual periods started (menarche).  Heavy smokers and very thin women may develop menopause earlier in life. What are the causes?  The ovaries stop producing the female hormones estrogen and progesterone. Other causes include:  Surgery to remove both ovaries.  The ovaries stop functioning for no known reason.  Tumors of the pituitary gland in the brain.  Medical disease that affects the ovaries and hormone production.  Radiation  treatment to the abdomen or pelvis.  Chemotherapy that affects the ovaries.  What are the signs or symptoms?  Hot flashes.  Night sweats.  Decrease in sex drive.  Vaginal dryness and thinning of the vagina causing painful intercourse.  Dryness of the skin and developing wrinkles.  Headaches.  Tiredness.  Irritability.  Memory problems.  Weight gain.  Bladder infections.  Hair growth of the face and chest.  Infertility. More serious symptoms include:  Loss of bone (osteoporosis) causing breaks (fractures).  Depression.  Hardening and narrowing of the arteries (atherosclerosis) causing heart attacks and strokes.  How is this diagnosed?  When the menstrual periods have stopped for 12 straight months.  Physical exam.  Hormone studies of the blood. How is this treated? There are many treatment choices and nearly as many questions about them. The decisions to treat or not to treat menopausal changes is an individual choice made with your health care provider. Your health care provider can discuss the treatments with you. Together, you can decide which treatment will work best for you. Your treatment choices may include:  Hormone therapy (estrogen and progesterone).  Non-hormonal medicines.  Treating the individual symptoms with medicine (for example antidepressants for depression).  Herbal medicines that may help specific symptoms Estroven or  any soy estrogen, and/or Black Cohosh  Counseling by a psychiatrist or psychologist.  Group therapy.  Lifestyle changes including: ? Eating healthy. ? Regular exercise. ? Limiting caffeine and alcohol. ? Stress management and meditation.  No treatment.  Follow these instructions at home:  Take the medicine your health care provider gives you as directed.  Get plenty of sleep and rest.  Exercise regularly.  Eat a diet that contains calcium (good for the bones) and soy products (acts like estrogen  hormone).  Avoid alcoholic beverages.  Do not smoke.  If you have hot flashes, dress in layers.  Take supplements, calcium, and vitamin D to strengthen bones.  You can use over-the-counter lubricants or moisturizers for vaginal dryness.  Group therapy is sometimes very helpful.  Acupuncture may be helpful in some cases. Contact a health care provider if:  You are not sure you are in menopause.  You are having menopausal symptoms and need advice and treatment.  You are still having menstrual periods after age 87 years.  You have pain with intercourse.  Menopause is complete (no menstrual period for 12 months) and you develop vaginal bleeding.  You need a referral to a specialist (gynecologist, psychiatrist, or psychologist) for treatment. Get help right away if:  You have severe depression.  You have excessive vaginal bleeding.  You fell and think you have a broken bone.  You have pain when you urinate.  You develop leg or chest pain.  You have a fast pounding heart beat (palpitations).  You have severe headaches.  You develop vision problems.  You feel a lump in your breast.  You have abdominal pain or severe indigestion. This information is not intended to replace advice given to you by your health care provider. Make sure you discuss any questions you have with your health care provider. Document Released: 02/23/2004 Document Revised: 05/10/2016 Document Reviewed: 07/02/2013 Elsevier Interactive Patient Education  2017 [employer].

## 2018-06-16 NOTE — Progress Notes (Signed)
  Subjective:     [name] is a 57 y.o. female and is here for a comprehensive physical exam. The patient reports problems - hot flashes. Hot flashes. Worse in the am. Has night sweats at times. Had a case of breast abscess. Hernia at umbilicus. Reports painful intercourse, some trouble with urination. Reports unable to fully empty her bladder. No urinary leaking.   Health Maintenance  Topic Date Due  . Hepatitis C Screening  July 05, 1961  . HIV Screening  08/09/1978  . TETANUS/TDAP  08/09/1982  . PAP SMEAR  01/13/2018  . INFLUENZA VACCINE  07/17/2018  . MAMMOGRAM  09/14/2018  . COLONOSCOPY  05/19/2019    The following portions of the patient's history were reviewed and updated as appropriate: allergies, current medications, past family history, past medical history, past social history, past surgical history and problem list.  Review of Systems Pertinent items are noted in HPI.   Objective:    BP 138/82   Pulse 71   Ht 5\' 8"  (1.727 m)   Wt 208 lb 6.4 oz (94.5 kg)   BMI 31.69 kg/m  General appearance: alert, cooperative and appears stated age Head: Normocephalic, without obvious abnormality, atraumatic Neck: no adenopathy, supple, symmetrical, trachea midline and thyroid not enlarged, symmetric, no tenderness/mass/nodules Lungs: clear to auscultation bilaterally Breasts: normal appearance, no masses or tenderness Heart: regular rate and rhythm, S1, S2 normal, no murmur, click, rub or gallop Abdomen: soft, non-tender; bowel sounds normal; no masses,  no organomegaly Pelvic: cervix normal in appearance, external genitalia normal, no adnexal masses or tenderness, no cervical motion tenderness, uterus surgically absent and vagina normal without discharge Extremities: extremities normal, atraumatic, no cyanosis or edema Pulses: 2+ and symmetric Skin: Skin color, texture, turgor normal. No rashes or lesions Lymph nodes: Cervical, supraclavicular, and axillary nodes  normal. Neurologic: Grossly normal    Assessment:    Healthy female exam.      Plan:   Problem List Items Addressed This Visit      Unprioritized   Menopause    Options for treatment of menopause reviewed in detail. Would not need progesterone as she has no uterus. Risks of clots, strokes, breast CA, reviewed. She will try herbal and lifestyle changes to start. If not working, consider transdermal estrogen.       Other Visit Diagnoses    Screening for malignant neoplasm of cervix       Relevant Orders   Cytology - PAP   Encounter for gynecological examination with abnormal finding       Relevant Orders   MM 3D SCREEN BREAST BILATERAL   CBC   TSH   Hemoglobin A1c   Comprehensive metabolic panel   Lipid panel   Vitamin D (25 hydroxy)   Hepatitis C antibody   HIV antibody     Return in 1 year (on 06/17/2019).    See After Visit Summary for Counseling Recommendations

## 2018-06-17 LAB — COMPREHENSIVE METABOLIC PANEL
ALBUMIN: 4.3 g/dL (ref 3.5–5.5)
ALT: 21 IU/L (ref 0–32)
AST: 19 IU/L (ref 0–40)
Albumin/Globulin Ratio: 1.9 (ref 1.2–2.2)
Alkaline Phosphatase: 80 IU/L (ref 39–117)
BILIRUBIN TOTAL: 0.3 mg/dL (ref 0.0–1.2)
BUN / CREAT RATIO: 12 (ref 9–23)
BUN: 10 mg/dL (ref 6–24)
CO2: 24 mmol/L (ref 20–29)
CREATININE: 0.83 mg/dL (ref 0.57–1.00)
Calcium: 9.8 mg/dL (ref 8.7–10.2)
Chloride: 103 mmol/L (ref 96–106)
GFR calc non Af Amer: 80 mL/min/{1.73_m2} (ref >59)
GFR, EST AFRICAN AMERICAN: 92 mL/min/{1.73_m2} (ref >59)
GLUCOSE: 91 mg/dL (ref 65–99)
Globulin, Total: 2.3 g/dL (ref 1.5–4.5)
Potassium: 4 mmol/L (ref 3.5–5.2)
Sodium: 140 mmol/L (ref 134–144)
TOTAL PROTEIN: 6.6 g/dL (ref 6.0–8.5)

## 2018-06-17 LAB — LIPID PANEL
CHOL/HDL RATIO: 4.1 ratio (ref 0.0–4.4)
Cholesterol, Total: 188 mg/dL (ref 100–199)
HDL: 46 mg/dL (ref >39)
LDL CALC: 117 mg/dL — AB (ref 0–99)
Triglycerides: 124 mg/dL (ref 0–149)
VLDL CHOLESTEROL CAL: 25 mg/dL (ref 5–40)

## 2018-06-17 LAB — HEPATITIS C ANTIBODY

## 2018-06-17 LAB — CBC
Hematocrit: 40.9 % (ref 34.0–46.6)
Hemoglobin: 14.3 g/dL (ref 11.1–15.9)
MCH: 28.5 pg (ref 26.6–33.0)
MCHC: 35 g/dL (ref 31.5–35.7)
MCV: 82 fL (ref 79–97)
PLATELETS: 221 10*3/uL (ref 150–450)
RBC: 5.02 x10E6/uL (ref 3.77–5.28)
RDW: 14.3 % (ref 12.3–15.4)
WBC: 5 10*3/uL (ref 3.4–10.8)

## 2018-06-17 LAB — HEMOGLOBIN A1C
Est. average glucose Bld gHb Est-mCnc: 108 mg/dL
Hgb A1c MFr Bld: 5.4 % (ref 4.8–5.6)

## 2018-06-17 LAB — VITAMIN D 25 HYDROXY (VIT D DEFICIENCY, FRACTURES): VIT D 25 HYDROXY: 40.3 ng/mL (ref 30.0–100.0)

## 2018-06-17 LAB — TSH: TSH: 1.27 u[IU]/mL (ref 0.450–4.500)

## 2018-06-17 LAB — HIV ANTIBODY (ROUTINE TESTING W REFLEX): HIV Screen 4th Generation wRfx: NONREACTIVE

## 2018-06-18 LAB — CYTOLOGY - PAP
Diagnosis: NEGATIVE
HPV: NOT DETECTED

## 2018-07-10 ENCOUNTER — Ambulatory Visit
Admission: RE | Admit: 2018-07-10 | Discharge: 2018-07-10 | Disposition: A | Discharge status: Home / Self Care | Payer: 59 | Source: Ambulatory Visit | Attending: Family Medicine | Admitting: Family Medicine

## 2018-07-10 DIAGNOSIS — Z01411 Encounter for gynecological examination (general) (routine) with abnormal findings: Secondary | ICD-10-CM

## 2019-01-30 ENCOUNTER — Ambulatory Visit: Payer: 59 | Admitting: Family Medicine

## 2019-01-30 ENCOUNTER — Encounter: Payer: Self-pay | Admitting: Family Medicine

## 2019-01-30 ENCOUNTER — Ambulatory Visit (INDEPENDENT_AMBULATORY_CARE_PROVIDER_SITE_OTHER): Payer: BLUE CROSS/BLUE SHIELD | Admitting: Family Medicine

## 2019-01-30 VITALS — BP 120/82 | HR 77 | Temp 98.4°F | Resp 16 | Ht 67.5 in | Wt 209.8 lb

## 2019-01-30 DIAGNOSIS — Z833 Family history of diabetes mellitus: Secondary | ICD-10-CM

## 2019-01-30 DIAGNOSIS — K219 Gastro-esophageal reflux disease without esophagitis: Secondary | ICD-10-CM | POA: Diagnosis not present

## 2019-01-30 DIAGNOSIS — M47816 Spondylosis without myelopathy or radiculopathy, lumbar region: Secondary | ICD-10-CM | POA: Insufficient documentation

## 2019-01-30 DIAGNOSIS — Z8249 Family history of ischemic heart disease and other diseases of the circulatory system: Secondary | ICD-10-CM

## 2019-01-30 DIAGNOSIS — Z1211 Encounter for screening for malignant neoplasm of colon: Secondary | ICD-10-CM

## 2019-01-30 DIAGNOSIS — G4700 Insomnia, unspecified: Secondary | ICD-10-CM

## 2019-01-30 DIAGNOSIS — M1990 Unspecified osteoarthritis, unspecified site: Secondary | ICD-10-CM

## 2019-01-30 DIAGNOSIS — G894 Chronic pain syndrome: Secondary | ICD-10-CM

## 2019-01-30 HISTORY — DX: Spondylosis without myelopathy or radiculopathy, lumbar region: M47.816

## 2019-01-30 HISTORY — DX: Chronic pain syndrome: G89.4

## 2019-01-30 HISTORY — DX: Family history of diabetes mellitus: Z83.3

## 2019-01-30 HISTORY — DX: Family history of ischemic heart disease and other diseases of the circulatory system: Z82.49

## 2019-01-30 LAB — COMPREHENSIVE METABOLIC PANEL
ALBUMIN: 4.1 g/dL (ref 3.5–5.2)
ALT: 26 U/L (ref 0–35)
AST: 20 U/L (ref 0–37)
Alkaline Phosphatase: 91 U/L (ref 39–117)
BUN: 8 mg/dL (ref 6–23)
CHLORIDE: 106 meq/L (ref 96–112)
CO2: 28 meq/L (ref 19–32)
Calcium: 10 mg/dL (ref 8.4–10.5)
Creatinine, Ser: 0.91 mg/dL (ref 0.40–1.20)
GFR: 64.07 mL/min (ref >60.00)
GLUCOSE: 84 mg/dL (ref 70–99)
POTASSIUM: 4.2 meq/L (ref 3.5–5.1)
SODIUM: 141 meq/L (ref 135–145)
Total Bilirubin: 0.5 mg/dL (ref 0.2–1.2)
Total Protein: 7 g/dL (ref 6.0–8.3)

## 2019-01-30 LAB — VITAMIN D 25 HYDROXY (VIT D DEFICIENCY, FRACTURES): VITD: 34.07 ng/mL (ref 30.00–100.00)

## 2019-01-30 LAB — LIPID PANEL
CHOLESTEROL: 184 mg/dL (ref 0–200)
HDL: 40.6 mg/dL (ref >39.00)
LDL Cholesterol: 110 mg/dL — ABNORMAL HIGH (ref 0–99)
NonHDL: 143.45
Total CHOL/HDL Ratio: 5
Triglycerides: 166 mg/dL — ABNORMAL HIGH (ref 0.0–149.0)
VLDL: 33.2 mg/dL (ref 0.0–40.0)

## 2019-01-30 LAB — THYROID PANEL WITH TSH
Free Thyroxine Index: 2.6 (ref 1.4–3.8)
T3 UPTAKE: 28 % (ref 22–35)
T4, Total: 9.3 ug/dL (ref 5.1–11.9)
TSH: 1.11 mIU/L

## 2019-01-30 LAB — B12 AND FOLATE PANEL: VITAMIN B 12: 524 pg/mL (ref 211–911)

## 2019-01-30 LAB — CBC
HCT: 44.8 % (ref 36.0–46.0)
Hemoglobin: 15.1 g/dL — ABNORMAL HIGH (ref 12.0–15.0)
MCHC: 33.6 g/dL (ref 30.0–36.0)
MCV: 85.6 fl (ref 78.0–100.0)
PLATELETS: 196 10*3/uL (ref 150.0–400.0)
RBC: 5.23 Mil/uL — AB (ref 3.87–5.11)
RDW: 13.8 % (ref 11.5–15.5)
WBC: 5.8 10*3/uL (ref 4.0–10.5)

## 2019-01-30 LAB — HEMOGLOBIN A1C: HEMOGLOBIN A1C: 5.4 % (ref 4.6–6.5)

## 2019-01-30 LAB — MAGNESIUM: Magnesium: 1.8 mg/dL (ref 1.5–2.5)

## 2019-01-30 MED ORDER — ESZOPICLONE 2 MG PO TABS: 2.0000 mg | ORAL_TABLET | Freq: Every evening | ORAL | 1 refills | Status: DC | PRN

## 2019-01-30 MED ORDER — OMEPRAZOLE 20 MG PO CPDR
20.0000 mg | DELAYED_RELEASE_CAPSULE | Freq: Every day | ORAL | 3 refills | Status: DC
Start: 2019-01-30 — End: 2020-02-08

## 2019-01-30 NOTE — Patient Instructions (Addendum)
Take just a women's daily multivitamin for now until we see your lab results and if there is need for any extra.   Insomnia Insomnia is a sleep disorder that makes it difficult to fall asleep or stay asleep. Insomnia can cause fatigue, low energy, difficulty concentrating, mood swings, and poor performance at work or school. There are three different ways to classify insomnia:  Difficulty falling asleep.  Difficulty staying asleep.  Waking up too early in the morning. Any type of insomnia can be long-term (chronic) or short-term (acute). Both are common. Short-term insomnia usually lasts for three months or less. Chronic insomnia occurs at least three times a week for longer than three months. What are the causes? Insomnia may be caused by another condition, situation, or substance, such as:  Anxiety.  Certain medicines.  Gastroesophageal reflux disease (GERD) or other gastrointestinal conditions.  Asthma or other breathing conditions.  Restless legs syndrome, sleep apnea, or other sleep disorders.  Chronic pain.  Menopause.  Stroke.  Abuse of alcohol, tobacco, or illegal drugs.  Mental health conditions, such as depression.  Caffeine.  Neurological disorders, such as Alzheimer's disease.  An overactive thyroid (hyperthyroidism). Sometimes, the cause of insomnia may not be known. What increases the risk? Risk factors for insomnia include:  Gender. Women are affected more often than men.  Age. Insomnia is more common as you get older.  Stress.  Lack of exercise.  Irregular work schedule or working night shifts.  Traveling between different time zones.  Certain medical and mental health conditions. What are the signs or symptoms? If you have insomnia, the main symptom is having trouble falling asleep or having trouble staying asleep. This may lead to other symptoms, such as:  Feeling fatigued or having low energy.  Feeling nervous about going to  sleep.  Not feeling rested in the morning.  Having trouble concentrating.  Feeling irritable, anxious, or depressed. How is this diagnosed? This condition may be diagnosed based on:  Your symptoms and medical history. Your health care provider may ask about: ? Your sleep habits. ? Any medical conditions you have. ? Your mental health.  A physical exam. How is this treated? Treatment for insomnia depends on the cause. Treatment may focus on treating an underlying condition that is causing insomnia. Treatment may also include:  Medicines to help you sleep.  Counseling or therapy.  Lifestyle adjustments to help you sleep better. Follow these instructions at home: Eating and drinking   Limit or avoid alcohol, caffeinated beverages, and cigarettes, especially close to bedtime. These can disrupt your sleep.  Do not eat a large meal or eat spicy foods right before bedtime. This can lead to digestive discomfort that can make it hard for you to sleep. Sleep habits   Keep a sleep diary to help you and your health care provider figure out what could be causing your insomnia. Write down: ? When you sleep. ? When you wake up during the night. ? How well you sleep. ? How rested you feel the next day. ? Any side effects of medicines you are taking. ? What you eat and drink.  Make your bedroom a dark, comfortable place where it is easy to fall asleep. ? Put up shades or blackout curtains to block light from outside. ? Use a white noise machine to block noise. ? Keep the temperature cool.  Limit screen use before bedtime. This includes: ? Watching TV. ? Using your smartphone, tablet, or computer.  Stick to a routine  that includes going to bed and waking up at the same times every day and night. This can help you fall asleep faster. Consider making a quiet activity, such as reading, part of your nighttime routine.  Try to avoid taking naps during the day so that you sleep better at  night.  Get out of bed if you are still awake after 15 minutes of trying to sleep. Keep the lights down, but try reading or doing a quiet activity. When you feel sleepy, go back to bed. General instructions  Take over-the-counter and prescription medicines only as told by your health care provider.  Exercise regularly, as told by your health care provider. Avoid exercise starting several hours before bedtime.  Use relaxation techniques to manage stress. Ask your health care provider to suggest some techniques that may work well for you. These may include: ? Breathing exercises. ? Routines to release muscle tension. ? Visualizing peaceful scenes.  Make sure that you drive carefully. Avoid driving if you feel very sleepy.  Keep all follow-up visits as told by your health care provider. This is important. Contact a health care provider if:  You are tired throughout the day.  You have trouble in your daily routine due to sleepiness.  You continue to have sleep problems, or your sleep problems get worse. Get help right away if:  You have serious thoughts about hurting yourself or someone else. If you ever feel like you may hurt yourself or others, or have thoughts about taking your own life, get help right away. You can go to your nearest emergency department or call:  Your local emergency services (911 in the [place]  A suicide crisis helpline, such as the [facility] at 647-472-3178. This is open 24 hours a day. Summary  Insomnia is a sleep disorder that makes it difficult to fall asleep or stay asleep.  Insomnia can be long-term (chronic) or short-term (acute).  Treatment for insomnia depends on the cause. Treatment may focus on treating an underlying condition that is causing insomnia.  Keep a sleep diary to help you and your health care provider figure out what could be causing your insomnia. This information is not intended to replace advice given  to you by your health care provider. Make sure you discuss any questions you have with your health care provider. Document Released: 11/30/2000 Document Revised: 09/12/2017 Document Reviewed: 09/12/2017 Elsevier Interactive Patient Education  2019 [employer].

## 2019-01-30 NOTE — Progress Notes (Signed)
Subjective:    Patient ID: [name], female    DOB: 01/14/1961, 58 y.o.   MRN: [id number]  HPI   Patient presents to clinic to establish with primary care.  Main concern today is some multiple joint pain, mainly in knees and low back.  Has long history of arthritis in knees and lumbar spondylosis.  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  States she will feel very stiff in the morning, describes herself as feeling like the tension band and have to move slowly to get joints and motion.  She will use ibuprofen as needed.  She has gotten joint injections in low back before which were helpful.  Patient also is having issues with insomnia.  In past she did take doxepin, but this gave her very strange dreams so she stopped taking.  Currently she uses melatonin which does help her to fall asleep but she still awakens in the middle of the night and then stares at her ceiling for a few hours until her alarm clock goes off.  Patient does have heartburn, has taken omeprazole daily for the past couple of years.  Did have an endoscopy and colonoscopy done in June 2010.   Patient has a strong family history of heart disease/heart attack.  States her mother died unexpectedly from a heart attack at the age of 65 and her grandfather passed from a heart attack as well.  Currently her medications other than ibuprofen as needed she will take omeprazole 20 mg once daily and also takes a magnesium supplement, B12 supplement, calcium plus vitamin D supplement and a women's multivitamin.  Vaccines, Pap smear, mammogram are all up-to-date for her age.  Past Medical History:  Diagnosis Date  . Arthritis   . Degenerative disorder of bone   . Genuine stress incontinence, female 2012   patient had surgical repair  . GERD (gastroesophageal reflux disease)   . Obesity    Social History   Tobacco Use  . Smoking status: Former  Smoker    Types: Cigarettes    Last attempt to quit: 01/28/2003    Years since quitting: 16.0  . Smokeless tobacco: Never Used  Substance Use Topics  . Alcohol use: Not Currently    Comment: socially   Past Surgical History:  Procedure Laterality Date  . excision of vulvar lesion  05/02/2011  . LAPAROSCOPIC HYSTERECTOMY  10/23/2010   mennorhagia/fibroids  . Midurethral sling  05/02/2011   Family History  Problem Relation Age of Onset  . Heart attack Mother   . Hypertension Mother   . Diabetes Son   . Heart attack Paternal Grandmother   . Cancer Paternal Grandfather    Review of Systems  Constitutional: Negative for chills, fatigue and fever.  HENT: Negative for congestion, ear pain, sinus pain and sore throat.   Eyes: Negative.   Respiratory: Negative for cough, shortness of breath and wheezing.   Cardiovascular: Negative for chest pain, palpitations and leg swelling.  Gastrointestinal: Negative for abdominal pain, diarrhea, nausea and vomiting.  Genitourinary: Negative for dysuria, frequency and urgency.  Musculoskeletal: Multiple joint pain, mainly knees today Skin: Negative for color change, pallor and rash.  Neurological: Negative for syncope, light-headedness and headaches.  Psychiatric/Behavioral: The patient is not nervous/anxious.       Objective:   Physical Exam Vitals signs and nursing note reviewed.  Constitutional:      Appearance: She is well-developed.  HENT:     Head: Normocephalic and atraumatic.     Right Ear: External ear normal.     Left Ear: External ear normal.     Nose: Nose normal.  Eyes:     General: No scleral icterus.       Right eye: No discharge.        Left eye: No discharge.     Conjunctiva/sclera: Conjunctivae normal.     Pupils: Pupils are equal, round, and reactive to light.  Neck:     Musculoskeletal: Normal range of motion and neck supple.     Trachea: No tracheal deviation.  Cardiovascular:     Rate and Rhythm: Normal rate  and regular rhythm.     Heart sounds: Normal heart sounds. No murmur. No friction rub. No gallop.   Pulmonary:     Effort: Pulmonary effort is normal. No respiratory distress.     Breath sounds: Normal breath sounds. No wheezing or rales.  Chest:     Chest wall: No tenderness.  Abdominal:     General: Bowel sounds are normal.     Palpations: Abdomen is soft.     Tenderness: There is no abdominal tenderness. There is no guarding.  Musculoskeletal: Normal range of motion.        General: No deformity.  Lymphadenopathy:     Cervical: No cervical adenopathy.  Skin:    General: Skin is warm and dry.     Capillary Refill: Capillary refill takes less than 2 seconds.     Coloration: Skin is not pale.     Findings: No erythema.  Neurological:     Mental Status: She is alert and oriented to person, place, and time.     Cranial Nerves: No cranial nerve deficit.     Gait: Gait normal.  Psychiatric:        Behavior: Behavior normal.        Thought Content: Thought content normal.    Vitals:   01/30/19 1028  BP: 120/82  Pulse: 77  Resp: 16  Temp: 98.4 F (36.9 C)  SpO2: 97%      Assessment & Plan:   GERD-patient will continue omeprazole 20 mg/day.  We will be sure to include magnesium and B12 levels in our lab work due to omeprazole affecting the absorption of these electrolytes and vitamins.  Arthritis/chronic pain syndrome/lumbar spondylosis- patient will be referred back to pain management.  Advised at this time to do daily stretching and use ibuprofen as needed to help reduce pain.  Also discussed using topical pain rub such as Biofreeze or BenGay on sore joints.  Colon cancer screening-referral to GI placed due to colonoscopy due this June.  Also noted on this referral at that point patient may possibly need endoscopy as well due to her GERD  Insomnia-we will trial patient on Lunesta 2 mg daily to see if this helps insomnia.  Good sleep hygiene discussed.  Family history of  heart disease-EKG performed in clinic today.  Reviewed by me.  Shows normal sinus rhythm and is unremarkable for any acute cardiac changes.  Family history of diabetes-we will include A1c and lab work today.  Blood work collected in clinic today.  Patient will return to clinic in approximately 4 weeks for recheck on insomnia after starting Lunesta.  She is aware she can return to clinic sooner if any issues arise.  Also advised she should hear something about her referrals in the next 7 to 14 days, if she does  not hear something advised to call office and let us know.

## 2019-02-12 ENCOUNTER — Encounter: Payer: Self-pay | Admitting: *Deleted

## 2019-02-27 ENCOUNTER — Other Ambulatory Visit: Payer: Self-pay

## 2019-02-27 ENCOUNTER — Encounter: Payer: Self-pay | Admitting: Family Medicine

## 2019-02-27 ENCOUNTER — Ambulatory Visit: Payer: BLUE CROSS/BLUE SHIELD | Admitting: Family Medicine

## 2019-02-27 ENCOUNTER — Ambulatory Visit (INDEPENDENT_AMBULATORY_CARE_PROVIDER_SITE_OTHER): Payer: BLUE CROSS/BLUE SHIELD

## 2019-02-27 VITALS — BP 116/78 | HR 79 | Temp 99.1°F | Resp 16 | Ht 68.0 in | Wt 212.6 lb

## 2019-02-27 DIAGNOSIS — G894 Chronic pain syndrome: Secondary | ICD-10-CM

## 2019-02-27 DIAGNOSIS — B351 Tinea unguium: Secondary | ICD-10-CM

## 2019-02-27 DIAGNOSIS — G4700 Insomnia, unspecified: Secondary | ICD-10-CM

## 2019-02-27 DIAGNOSIS — M79674 Pain in right toe(s): Secondary | ICD-10-CM | POA: Diagnosis not present

## 2019-02-27 DIAGNOSIS — M47816 Spondylosis without myelopathy or radiculopathy, lumbar region: Secondary | ICD-10-CM

## 2019-02-27 MED ORDER — ESZOPICLONE 3 MG PO TABS: 3.0000 mg | ORAL_TABLET | Freq: Every evening | ORAL | 2 refills | Status: DC | PRN

## 2019-02-27 MED ORDER — EFINACONAZOLE 10 % EX SOLN: CUTANEOUS | 2 refills | Status: DC

## 2019-02-27 NOTE — Progress Notes (Signed)
Subjective:    Patient ID: [name], female    DOB: [birth year], 58 y.o.   MRN: [id number]  HPI   Patient presents to clinic for follow-up on insomnia, chronic pain.  Patient has overall done well with Lunesta 2 mg.  States it does help to get her to sleep, but does wake up about halfway through the night and unable to get back to sleep.  Patient continues to have chronic pain related to lumbar spondylosis.  Has not yet heard from pain management referral placed at last visit.  Most recently has been using 1-2 Aleve almost every morning, overall has decent pain control effects with use of Aleve.  Patient also complains of pain in right third toe and changes in toenails of right foot.  Denies any injury to right foot  Patient Active Problem List   Diagnosis Date Noted  . Chronic pain syndrome 01/30/2019  . Family history of diabetes mellitus 01/30/2019  . Family history of heart disease 01/30/2019  . Lumbar spondylosis 01/30/2019  . Menopause 06/16/2018  . 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 Insomnia 01/13/2015  . Obesity 07/04/2011  . GERD (gastroesophageal reflux disease) 07/04/2011  . Arthritis 07/04/2011   Social History   Tobacco Use  . Smoking status: Former Smoker    Types: Cigarettes    Last attempt to quit: 01/28/2003    Years since quitting: 16.0  . Smokeless tobacco: Never Used  Substance Use Topics  . Alcohol use: Not Currently    Comment: socially   ROS   Constitutional: Negative for chills, fatigue and fever.  HENT: Negative for congestion, ear pain, sinus pain and sore throat.   Eyes: Negative.   Respiratory: Negative for cough, shortness of breath and wheezing.   Cardiovascular: Negative for chest pain, palpitations and leg swelling.  Gastrointestinal: Negative for abdominal pain, diarrhea, nausea and vomiting.  Genitourinary: Negative for dysuria, frequency and urgency.  Musculoskeletal: Negative for arthralgias and  myalgias.  Skin: +pain right 3rd toe, toenail changes  Neurological: Negative for syncope, light-headedness and headaches.  Psychiatric/Behavioral: The patient is not nervous/anxious.       Objective:   Physical Exam Vitals signs and nursing note reviewed.  Constitutional:      Appearance: She is well-developed.  HENT:     Head: Normocephalic and atraumatic.     Right Ear: Tympanic membrane, ear canal and external ear normal.     Left Ear: Tympanic membrane, ear canal and external ear normal.     Nose: Nose normal.     Mouth/Throat:     Mouth: Mucous membranes are moist.  Eyes:     General: No scleral icterus.       Right eye: No discharge.        Left eye: No discharge.     Extraocular Movements: Extraocular movements intact.     Conjunctiva/sclera: Conjunctivae normal.     Pupils: Pupils are equal, round, and reactive to light.  Neck:     Musculoskeletal: Normal range of motion and neck supple.     Trachea: No tracheal deviation.  Cardiovascular:     Rate and Rhythm: Normal rate and regular rhythm.     Pulses:          Dorsalis pedis pulses are 2+ on the right side and 2+ on the left side.       Posterior tibial pulses are 2+ on the right side and 2+ on the left side.  Heart sounds: Normal heart sounds. No murmur. No friction rub. No gallop.   Pulmonary:     Effort: Pulmonary effort is normal. No respiratory distress.     Breath sounds: Normal breath sounds. No wheezing, rhonchi or rales.  Musculoskeletal: Normal range of motion.        General: No deformity.       Feet:  Feet:     Comments: Location of toenail fungus indicated by red marks on diagram, it is on the first and third toes.  These toenails do appear thickened, yellow/white in color consistent with a toenail fungus.  Location of right third toe pain indicated by green mark on diagram. Lymphadenopathy:     Cervical: No cervical adenopathy.  Skin:    General: Skin is warm and dry.     Capillary Refill:  Capillary refill takes less than 2 seconds.     Coloration: Skin is not jaundiced or pale.     Findings: No erythema.  Neurological:     General: No focal deficit present.     Mental Status: She is alert and oriented to person, place, and time.     Cranial Nerves: No cranial nerve deficit.     Gait: Gait normal.  Psychiatric:        Mood and Affect: Mood normal.        Behavior: Behavior normal.        Thought Content: Thought content normal.    Vitals:   02/27/19 0909  BP: 116/78  Pulse: 79  Resp: 16  Temp: 99.1 F (37.3 C)  SpO2: 97%       Assessment & Plan:   Insomnia-we will increase Lunesta to 3 mg daily.  Patient will also take small dose of melatonin with Lunesta.  Discussed good sleep hygiene practices.  Chronic pain/lumbar spondylosis-patient advised that her kidney functions are stable, and she can take 1-2 Aleve in the morning if needed for pain control.  Also advised she can use regular Tylenol.  Pain management referral checked on, they had attempted to call patient per documentation in epic, patient given pain management clinic phone number.  Toenail fungus/pain in right third toe-we will get x-ray to further evaluate pain.  Toenail appearance appears consistent with fungus.  Patient will trial Jublia and also will do podiatry referral for further evaluation and management.  Patient will follow-up here in approximately 3 months for recheck on chronic medical conditions.  Advised to return to clinic sooner if any issues arise.

## 2019-04-02 NOTE — Progress Notes (Signed)
Patient's Name: [name]  MRN: [id number]  Referring Provider: Jodelle Green, FNP  DOB: Sep 15, 1961  PCP: Jodelle Green, FNP  DOS: 04/07/2019  Note by: Gillis Santa, MD  Service setting: Ambulatory outpatient  Specialty: Interventional Pain Management  Location: [facility] Virtual Visit  Visit type: Initial Patient Evaluation  Patient type: New Patient   Pain Management Virtual Encounter Note - Virtual Visit via [place] (real-time audio visits between healthcare provider and patient).  Patient's Phone No.:  [phone] (home); [phone] (mobile); (Preferred) [phone] [email or web address]  [id number]: [phone]: [phone]  Pre-screening note:  Our staff contacted [name] and offered her an "in person", "face-to-face" appointment versus a telephone encounter. She indicated preferring the telephone encounter, at this time.  Primary Reason(s) for Visit: Tele-Encounter for initial evaluation of one or more chronic problems (new to examiner) potentially causing chronic pain, and posing a threat to normal musculoskeletal function. (Level of risk: High) CC: Back Pain; Knee Pain; Shoulder Pain; and Ankle Pain  I contacted [name] 04/07/2019 at 10:56 AM via telephone and clearly identified myself as BGillis Santa MD. I verified that I was speaking with the correct person using two identifiers (Name and date of birth: [id number].  Advanced Informed Consent I sought verbal advanced consent from SServando Snarefor virtual visit interactions. I informed [name] of possible security and privacy concerns, risks, and limitations associated with providing "not-in-person" medical evaluation and management services. I also informed [name] of the availability of "in-person" appointments. Finally, I informed her that there would be a charge for  the virtual visit and that she could be  personally, fully or partially, financially responsible for it. Ms. SHemannexpressed understanding and agreed to proceed.   HPI  Ms. SNovackis a 58y.o. year old, female patient, contacted today for an initial evaluation of her chronic pain. She has Obesity; GERD (gastroesophageal reflux disease); Arthritis; Squamous cell carcinoma of nasolabial fold; Insomnia; Menopause; Chronic pain syndrome; Family history of diabetes mellitus; Family history of heart disease; and Lumbar spondylosis on their problem list.  Pain Assessment: Location:    low back, bilateral knees, bilateral hips Radiating:  no Onset:  > 3 months ago Duration:  worse in the evening Quality:  aching, throbbing, shooting, pressure-like, pulsating, distressing Severity: 5 /10 (subjective, self-reported pain score)  Note: Reported level is compatible with observation.                         When using our objective Pain Scale, levels between 6 and 10/10 are said to belong in an emergency room, as it progressively worsens from a 6/10, described as severely limiting, requiring emergency care not usually available at an outpatient pain management facility. At a 6/10 level, communication becomes difficult and requires great effort. Assistance to reach the emergency department may be required. Facial flushing and profuse sweating along with potentially dangerous increases in heart rate and blood pressure will be evident. Effect on ADL:  limits ability to walk for an extended period of time Timing:  worse when she wakes Modifying factors:  Improves with rest, heat, stretching, medications including Aleve and hydrocodone BP:    HR:    Onset and Duration: Gradual, Date of onset: 3 years ago and Present longer than 3 months Cause of pain: turned funny removing things from cart to  put in car Severity: No change since onset, NAS-11 at its worse: 9/10, NAS-11 at its best: 4/10, NAS-11 now: 5/10 and NAS-11  on the average: 5/10 Timing: Not influenced by the time of the day Aggravating Factors: Bending, Motion, Prolonged standing and Twisting Alleviating Factors: Hot packs, Lying down, Resting and Warm showers or baths Associated Problems: Inability to control bowel and Swelling Quality of Pain: Aching, Nagging and Shooting Previous Examinations or Tests: MRI scan, Nerve block and Orthopedic evaluation Previous Treatments: TENS and The patient denies Patient states she has had injections in the past  Very nice 58 year old female with a chief complaint of axial low back pain with radiation to bilateral buttocks, bilateral knee pain, bilateral wrist pain, bilateral shoulder pain.  Patient was previously seeing Dr. Alcide Evener at [place] for axial low back pain related to lumbar spondylosis lumbar facet arthropathy and lumbar degenerative disc disease.  She has received lumbar facet medial branch nerve blocks as well as 1 round of lumbar radiofrequency ablation.  She states that these were moderately effective but the price did not justify the short-term benefit she received.  She denies having lumbar epidural steroid she works at a dental clinic at the front desk.  She states that she has a family history of osteoarthritis.  She has seen a rheumatologist for RA work-up which was negative.  She was managed on hydrocodone in the past, 5 mg twice daily PRN, quantity 45/month.  Patient also takes Aleve 2020 mg as needed.  She does endorse stiffness and muscle tension in the morning.  She has participated in physical therapy in the past but states that it was not very effective.  She denies having been told that she has fibromyalgia.  She denies having tried gabapentin, Lyrica, Cymbalta.  Of note, patient's last opioid fill was on 08/13/2018.  She has been off of opioid medications since this time.  Due to change in insurance.  She states that her quality of life and her overall pain has been worse and more  difficult to manage respectively as a result of discontinuing hydrocodone.  The patient was informed that my practice is divided into two sections: an interventional pain management section, as well as a completely separate and distinct medication management section. I explained that I have procedure days for my interventional therapies, and evaluation days for follow-ups and medication management. Because of the amount of documentation required during both, they are kept separated. This means that there is the possibility that she may be scheduled for a procedure on one day, and medication management the next. I have also informed her that because of staffing and facility limitations, I no longer take patients for medication management only. To illustrate the reasons for this, I gave the patient the example of surgeons, and how inappropriate it would be to refer a patient to his/her care, just to write for the post-surgical antibiotics on a surgery done by a different surgeon.   Because interventional pain management is my board-certified specialty, the patient was informed that joining my practice means that they are open to any and all interventional therapies. I made it clear that this does not mean that they will be forced to have any procedures done. What this means is that I believe interventional therapies to be essential part of the diagnosis and proper management of chronic pain conditions. Therefore, patients not interested in these interventional alternatives will be better served under the care of a different practitioner.  The patient was  also made aware of my Comprehensive Pain Management Safety Guidelines where by joining my practice, they limit all of their nerve blocks and joint injections to those done by our practice, for as long as we are retained to manage their care.   Historic Controlled Substance Pharmacotherapy Review   08/13/2018  2   08/13/2018  Hydrocodone-Acetamin 5-325 MG   45.00 15 Ri Ram   [id number]   [place] ([id number])   0  15.00 MME  Comm Ins   [place]    Medications: The patient did not bring the medication(s) to the appointment, as requested in our "New Patient Package" Pharmacodynamics: Desired effects: Analgesia: The patient reports >50% benefit. Reported improvement in function: The patient reports medication allows her to accomplish basic ADLs. Clinically meaningful improvement in function (CMIF): Sustained CMIF goals met Perceived effectiveness: Described as relatively effective, allowing for increase in activities of daily living (ADL) Undesirable effects: Side-effects or Adverse reactions: None reported Historical Monitoring: The patient  reports no history of drug use. List of all UDS Test(s): No results found for: MDMA, COCAINSCRNUR, [place], [place], CANNABQUANT, THCU, [place] List of other Serum/Urine Drug Screening Test(s):  No results found for: AMPHSCRSER, BARBSCRSER, BENZOSCRSER, COCAINSCRSER, COCAINSCRNUR, PCPSCRSER, PCPQUANT, THCSCRSER, THCU, CANNABQUANT, OPIATESCRSER, OXYSCRSER, PROPOXSCRSER, ETH Historical Background Evaluation: [place] PMP: PDMP reviewed during this encounter. Six (6) year initial data search conducted.             [place] Department of public safety, offender search: Editor, commissioning Information) Non-contributory Risk Assessment Profile: Aberrant behavior: None observed or detected today Risk factors for fatal opioid overdose: None identified today Fatal overdose hazard ratio (HR): Calculation deferred Non-fatal overdose hazard ratio (HR): Calculation deferred Risk of opioid abuse or dependence: 0.7-3.0% with doses ? 36 MME/day and 6.1-26% with doses ? 120 MME/day. Substance use disorder (SUD) risk level: See below   Pharmacologic Plan: As per protocol, I have not taken over any controlled substance management, pending the results of ordered tests and/or consults.            Initial impression: Pending review of available data and ordered  tests.  Meds   Current Outpatient Medications:  .  naproxen sodium (ALEVE) 220 MG tablet, Take 440 mg by mouth daily as needed., Disp: , Rfl:  .  omeprazole (PRILOSEC) 20 MG capsule, Take 1 capsule (20 mg total) by mouth daily., Disp: 90 capsule, Rfl: 3 .  azithromycin (ZITHROMAX) 250 MG tablet, , Disp: , Rfl:  .  gabapentin (NEURONTIN) 300 MG capsule, 300 mg qhs for 2 weeks and then increase to 300 mg BID, Disp: 60 capsule, Rfl: 1 .  valACYclovir (VALTREX) 500 MG tablet, , Disp: , Rfl:   ROS  Cardiovascular: No reported cardiovascular signs or symptoms such as High blood pressure, coronary artery disease, abnormal heart rate or rhythm, heart attack, blood thinner therapy or heart weakness and/or failure Pulmonary or Respiratory: No reported pulmonary signs or symptoms such as wheezing and difficulty taking a deep full breath (Asthma), difficulty blowing air out (Emphysema), coughing up mucus (Bronchitis), persistent dry cough, or temporary stoppage of breathing during sleep Neurological: No reported neurological signs or symptoms such as seizures, abnormal skin sensations, urinary and/or fecal incontinence, being born with an abnormal open spine and/or a tethered spinal cord Review of Past Neurological Studies: No results found for this or any previous visit. Psychological-Psychiatric: No reported psychological or psychiatric signs or symptoms such as difficulty sleeping, anxiety, depression, delusions or hallucinations (schizophrenial), mood swings (bipolar disorders) or suicidal ideations  or attempts Gastrointestinal: Reflux or heatburn Genitourinary: No reported renal or genitourinary signs or symptoms such as difficulty voiding or producing urine, peeing blood, non-functioning kidney, kidney stones, difficulty emptying the bladder, difficulty controlling the flow of urine, or chronic kidney disease Hematological: Weakness due to low blood hemoglobin or red blood cell count  (Anemia) Endocrine: No reported endocrine signs or symptoms such as high or low blood sugar, rapid heart rate due to high thyroid levels, obesity or weight gain due to slow thyroid or thyroid disease Rheumatologic: Joint aches and or swelling due to excess weight (Osteoarthritis) Musculoskeletal: Negative for myasthenia gravis, muscular dystrophy, multiple sclerosis or malignant hyperthermia Work History: Working full time  Allergies  Ms. Rabinovich has No Known Allergies.  Laboratory Chemistry  Inflammation Markers (CRP: Acute Phase) (ESR: Chronic Phase) No results found for: CRP, ESRSEDRATE, LATICACIDVEN                       Rheumatology Markers No results found for: RF, ANA, LABURIC, URICUR, LYMEIGGIGMAB, LYMEABIGMQN, HLAB27                      Renal Function Markers Lab Results  Component Value Date   BUN 8 01/30/2019   CREATININE 0.91 01/30/2019   BCR 12 06/16/2018   GFRAA 92 06/16/2018   GFRNONAA 80 06/16/2018                             Hepatic Function Markers Lab Results  Component Value Date   AST 20 01/30/2019   ALT 26 01/30/2019   ALBUMIN 4.1 01/30/2019   ALKPHOS 91 01/30/2019                        Electrolytes Lab Results  Component Value Date   NA 141 01/30/2019   K 4.2 01/30/2019   CL 106 01/30/2019   CALCIUM 10.0 01/30/2019   MG 1.8 01/30/2019                        Neuropathy Markers Lab Results  Component Value Date   VITAMINB12 524 01/30/2019   FOLATE >24.0 01/30/2019   HGBA1C 5.4 01/30/2019   HIV Non Reactive 06/16/2018                        CNS Tests No results found for: COLORCSF, APPEARCSF, RBCCOUNTCSF, WBCCSF, POLYSCSF, LYMPHSCSF, EOSCSF, PROTEINCSF, GLUCCSF, JCVIRUS, CSFOLI, IGGCSF, LABACHR, ACETBL                      Bone Pathology Markers Lab Results  Component Value Date   VD25OH 34.07 01/30/2019                         Coagulation Parameters Lab Results  Component Value Date   PLT 196.0 01/30/2019   DDIMER 0.29  05/15/2016                        Cardiovascular Markers Lab Results  Component Value Date   HGB 15.1 (H) 01/30/2019   HCT 44.8 01/30/2019                         ID Markers Lab Results  Component Value Date   HIV Non Reactive 06/16/2018  CA Markers No results found for: CEA, CA125, LABCA2                      Endocrine Markers Lab Results  Component Value Date   TSH 1.11 01/30/2019                        Note: Lab results reviewed.  Imaging Review   Results for orders placed during the hospital encounter of 12/04/14  DG Lumbar Spine Complete   Narrative CLINICAL DATA:  Back pain for 2 years, worse today.  EXAM: LUMBAR SPINE - COMPLETE 4+ VIEW  COMPARISON:  None.  FINDINGS: There is no evidence of lumbar spine fracture. Alignment is normal. Intervertebral disc spaces are grossly maintained, with slight narrowing at L5-S1 and L1-2.  IMPRESSION: No acute abnormality.   Electronically Signed   By: Rolla Flatten M.D.   On: 12/04/2014 18:01            Foot Imaging: Foot-R DG Complete:  Results for orders placed in visit on 02/27/19  DG Foot Complete Right   Narrative CLINICAL DATA:  Increasing pain in the right 3rd toe. No known injury.  EXAM: RIGHT FOOT COMPLETE - 3+ VIEW  COMPARISON:  None.  FINDINGS: Mild inferior calcaneal spur formation. Otherwise, normal appearing bones and soft tissues. No visible 3rd toe abnormality.  IMPRESSION: Mild inferior calcaneal spur formation. Otherwise, normal examination.   Electronically Signed   By: Claudie Revering M.D.   On: 02/27/2019 11:37     Complexity Note: Imaging results reviewed. Results shared with Ms. Stolarz, using Layman's terms.                         PFSH  Drug: Ms. Ergle  reports no history of drug use. Alcohol:  reports previous alcohol use. Tobacco:  reports that she quit smoking about 16 years ago. Her smoking use included cigarettes. She has never used smokeless  tobacco. Medical:  has a past medical history of Arthritis, Degenerative disorder of bone, Genuine stress incontinence, female (2012), GERD (gastroesophageal reflux disease), Migraines, and Obesity. Family: family history includes Arthritis in her maternal grandmother and mother; Cancer in her father and paternal grandfather; Diabetes in her son; Heart attack in her maternal grandfather, mother, and paternal grandmother; Hypertension in her mother.  Past Surgical History:  Procedure Laterality Date  . excision of vulvar lesion  05/02/2011  . LAPAROSCOPIC HYSTERECTOMY  10/23/2010   mennorhagia/fibroids  . Midurethral sling  05/02/2011   Active Ambulatory Problems    Diagnosis Date Noted  . Obesity 07/04/2011  . GERD (gastroesophageal reflux disease) 07/04/2011  . Arthritis 07/04/2011  . 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 Insomnia 01/13/2015  . Menopause 06/16/2018  . Chronic pain syndrome 01/30/2019  . Family history of diabetes mellitus 01/30/2019  . Family history of heart disease 01/30/2019  . Lumbar spondylosis 01/30/2019   Resolved Ambulatory Problems    Diagnosis Date Noted  . No Resolved Ambulatory Problems   Past Medical History:  Diagnosis Date  . Degenerative disorder of bone   . Genuine stress incontinence, female 2012  . Migraines    Assessment  Primary Diagnosis & Pertinent Problem List: The primary encounter diagnosis was Lumbar spondylosis. Diagnoses of Lumbar facet arthropathy, Degeneration of lumbar or lumbosacral intervertebral disc, Musculoskeletal back pain, Chronic pain syndrome, and Bilateral primary osteoarthritis of knee were also pertinent to this visit.  Visit Diagnosis (New problems to examiner): 1. Lumbar spondylosis   2. Lumbar facet arthropathy   3. Degeneration of lumbar or lumbosacral intervertebral disc   4. Musculoskeletal back pain   5. Chronic pain syndrome   6. Bilateral primary osteoarthritis of knee    Very nice  58 year old female with a chief complaint of axial low back pain with radiation to bilateral buttocks, bilateral knee pain, bilateral wrist pain, bilateral shoulder pain.  Patient was previously seeing Dr. Alcide Evener at [place] for axial low back pain related to lumbar spondylosis lumbar facet arthropathy and lumbar degenerative disc disease.  She has received lumbar facet medial branch nerve blocks as well as 1 round of lumbar radiofrequency ablation.  She states that these were moderately effective but the price did not justify the short-term benefit she received.  She denies having lumbar epidural steroid she works at a dental clinic at the front desk.  She states that she has a family history of osteoarthritis.  She has seen a rheumatologist for RA work-up which was negative.  She was managed on hydrocodone in the past, 5 mg twice daily PRN, quantity 45/month.  Patient also takes Aleve 2020 mg as needed.  She does endorse stiffness and muscle tension in the morning.  She has participated in physical therapy in the past but states that it was not very effective.  She denies having been told that she has fibromyalgia.  She denies having tried gabapentin, Lyrica, Cymbalta.  Of note, patient's last opioid fill was on 08/13/2018.  She has been off of opioid medications since this time.  Due to change in insurance.  She states that her quality of life and her overall pain has been worse and more difficult to manage respectively as a result of discontinuing hydrocodone.  In regards to treatment plan, recommend gabapentin 300 mg nightly to help with pain and sleep.  Patient has discontinued Lunesta due to side effects.  Can consider increasing gabapentin to 300 mg twice daily if no side effects after 2 to 3 weeks.  Furthermore, will obtain urine drug screen which is typical for new patients.  Pending UDS, can take patient over on her previous opioid regimen of hydrocodone 5 mg, quantity 45/month.  Future  considerations also include lumbar MRI as it has been greater than 3 years.  Can consider repeat lumbar facet medial branch nerve blocks for lumbar facet arthropathy related pain.  Also consider SI joint injection and or intra-articular hip joint injection for hip pain.  -Has tried intra-articular knee injections which were helpful for a short period of time.  We briefly discussed genicular nerve block radiofrequency ablation.  Can consider in the future.  Plan of Care (Initial workup plan)  Note: Ms. Hinger was reminded that as per protocol, today's visit has been an evaluation only. We have not taken over the patient's controlled substance management.  Problem-specific plan: No problem-specific Assessment & Plan notes found for this encounter.   Lab Orders     Compliance Drug Analysis, Ur Imaging Orders  No imaging studies ordered today   Pharmacotherapy (current): Medications ordered:  Meds ordered this encounter  Medications  . gabapentin (NEURONTIN) 300 MG capsule    Sig: 300 mg qhs for 2 weeks and then increase to 300 mg BID    Dispense:  60 capsule    Refill:  1   Medications administered during this visit: Breunna B. Schlesinger had no medications administered during this visit.   Pharmacological management options:  Opioid Analgesics: The patient was informed that there is no guarantee that she would be a candidate for opioid analgesics. The decision will be made following CDC guidelines. This decision will be based on the results of diagnostic studies, as well as Ms. Staffa's risk profile.   Membrane stabilizer: To be determined at a later time gabapentin 300 mg nightly can consider increasing to 300 mg twice daily.  Future considerations include Lyrica, Cymbalta, TCA  Muscle relaxant: To be determined at a later time  NSAID: To be determined at a later time  Other analgesic(s): To be determined at a later time   Interventional management options: Ms. Fontenot was informed that  there is no guarantee that she would be a candidate for interventional therapies. The decision will be based on the results of diagnostic studies, as well as Ms. Costantino's risk profile.  Procedure(s) under consideration:  Lumbar facet medial branch nerve blocks, repeat lumbar RFA SI joint block Intra-articular hip injection Bilateral genicular nerve block Intra-articular Hyalgan   Provider-requested follow-up: Return in about 3 weeks (around 04/28/2019) for Medication Management, virtual.  No future appointments.  Primary Care Physician: Jodelle Green, FNP Location: [facility] Facility Note by: Gillis Santa, MD Date: 04/07/2019; Time: 10:56 AM

## 2019-04-06 ENCOUNTER — Telehealth: Payer: Self-pay

## 2019-04-07 ENCOUNTER — Encounter: Payer: Self-pay | Admitting: Student in an Organized Health Care Education/Training Program

## 2019-04-07 ENCOUNTER — Telehealth: Payer: Self-pay | Admitting: *Deleted

## 2019-04-07 ENCOUNTER — Ambulatory Visit
Payer: BLUE CROSS/BLUE SHIELD | Attending: Student in an Organized Health Care Education/Training Program | Admitting: Student in an Organized Health Care Education/Training Program

## 2019-04-07 ENCOUNTER — Other Ambulatory Visit: Payer: Self-pay

## 2019-04-07 DIAGNOSIS — M47816 Spondylosis without myelopathy or radiculopathy, lumbar region: Secondary | ICD-10-CM | POA: Diagnosis not present

## 2019-04-07 DIAGNOSIS — G894 Chronic pain syndrome: Secondary | ICD-10-CM

## 2019-04-07 DIAGNOSIS — M170 Bilateral primary osteoarthritis of knee: Secondary | ICD-10-CM

## 2019-04-07 DIAGNOSIS — M549 Dorsalgia, unspecified: Secondary | ICD-10-CM | POA: Diagnosis not present

## 2019-04-07 DIAGNOSIS — M5137 Other intervertebral disc degeneration, lumbosacral region: Secondary | ICD-10-CM | POA: Diagnosis not present

## 2019-04-07 MED ORDER — GABAPENTIN 300 MG PO CAPS: ORAL_CAPSULE | ORAL | 1 refills | Status: DC

## 2019-04-13 LAB — COMPLIANCE DRUG ANALYSIS, UR

## 2019-04-20 ENCOUNTER — Telehealth: Payer: Self-pay

## 2019-04-20 NOTE — Telephone Encounter (Signed)
Do you want to move her appointment to a sooner date, or wait until her scheduled appt on 04-28-19?

## 2019-04-20 NOTE — Telephone Encounter (Signed)
She said Dr. Holley Raring told her that once he got her uds back he would give her a pain medicine script. She noticed in mychart that the results were in so she wants to know if he will call out something for her. She has an appt for a follow up on 5/12 and is in pain now.

## 2019-04-20 NOTE — Telephone Encounter (Signed)
Please call patient to reschedule to earlier date.

## 2019-04-22 ENCOUNTER — Encounter: Payer: Self-pay | Admitting: Student in an Organized Health Care Education/Training Program

## 2019-04-23 ENCOUNTER — Other Ambulatory Visit: Payer: Self-pay

## 2019-04-23 ENCOUNTER — Ambulatory Visit
Payer: BLUE CROSS/BLUE SHIELD | Attending: Student in an Organized Health Care Education/Training Program | Admitting: Student in an Organized Health Care Education/Training Program

## 2019-04-23 DIAGNOSIS — M170 Bilateral primary osteoarthritis of knee: Secondary | ICD-10-CM

## 2019-04-23 DIAGNOSIS — G894 Chronic pain syndrome: Secondary | ICD-10-CM | POA: Diagnosis not present

## 2019-04-23 DIAGNOSIS — Z0289 Encounter for other administrative examinations: Secondary | ICD-10-CM

## 2019-04-23 DIAGNOSIS — M5137 Other intervertebral disc degeneration, lumbosacral region: Secondary | ICD-10-CM | POA: Diagnosis not present

## 2019-04-23 DIAGNOSIS — M47816 Spondylosis without myelopathy or radiculopathy, lumbar region: Secondary | ICD-10-CM | POA: Diagnosis not present

## 2019-04-23 DIAGNOSIS — M549 Dorsalgia, unspecified: Secondary | ICD-10-CM

## 2019-04-23 MED ORDER — HYDROCODONE-ACETAMINOPHEN 5-325 MG PO TABS: 1.0000 | ORAL_TABLET | Freq: Every day | ORAL | 0 refills | Status: DC | PRN

## 2019-04-23 MED ORDER — GABAPENTIN 300 MG PO CAPS: 300.0000 mg | ORAL_CAPSULE | Freq: Two times a day (BID) | ORAL | 2 refills | Status: DC

## 2019-04-23 NOTE — Progress Notes (Signed)
Pain Management Virtual Encounter Note - Virtual Visit via [place] (real-time audio visits between healthcare provider and patient).  Patient's Phone No. & Preferred Pharmacy:  [phone] (home); [phone] (mobile); (Preferred) [phone] [email or web address]  [id number] Lorina Rabon, [facility] [place] [address] [place] [address] Phone: [phone] Fax: [phone]   Pre-screening note:  Our staff contacted [name] and offered her an "in person", "face-to-face" appointment versus a telephone encounter. She indicated preferring the telephone encounter, at this time.  Reason for Virtual Visit: COVID-19*  Social distancing based on CDC and AMA recommendations.   I contacted [name] on 04/23/2019 at 2:37 PM via video conference.      I clearly identified myself as Gillis Santa, MD. I verified that I was speaking with the correct person using two identifiers (Name and date of birth: Aug 03, 1961).  Advanced Informed Consent I sought verbal advanced consent from [name] for virtual visit interactions. I informed [name] of possible security and privacy concerns, risks, and limitations associated with providing "not-in-person" medical evaluation and management services. I also informed [name] of the availability of "in-person" appointments. Finally, I informed her that there would be a charge for the virtual visit and that she could be  personally, fully or partially, financially responsible for it. [name] expressed understanding and agreed to proceed.   Historic Elements   [name] is a 58 y.o. year old, female patient evaluated today after her last encounter by our practice on 04/20/2019. [name]  has a past medical history of Arthritis, Degenerative disorder of bone, Genuine stress incontinence, female (2012), GERD (gastroesophageal reflux disease), Migraines, and Obesity. She also  has a past surgical  history that includes Laparoscopic hysterectomy (10/23/2010); Midurethral sling (05/02/2011); and excision of vulvar lesion (05/02/2011). [name] has a current medication list which includes the following prescription(s): gabapentin, melatonin, naproxen sodium, omeprazole, valacyclovir, azithromycin, and hydrocodone-acetaminophen. She  reports that she quit smoking about 16 years ago. Her smoking use included cigarettes. She has never used smokeless tobacco. She reports previous alcohol use. She reports that she does not use drugs. [name] has No Known Allergies.   HPI  I last communicated with her on 04/07/2019. Today, she is being contacted for Second patient visit, medication management   UDS appropriate.  Has been utilizing gabapentin 300 mg nightly which she is finding benefit from.  I encouraged her to increase to 300 mg twice daily.  If the morning dose results in sedation recommended patient change to 600 mg nightly.  She endorsed understanding.    Pharmacotherapy Assessment   08/13/2018  2   08/13/2018  Hydrocodone-Acetamin 5-325 MG  45.00 [address]   [id number]   [place] (4142)   0  15.00 MME  Comm Ins   [place]   No prior prescriptions from this pain clinic.   Monitoring:  [place] PMP: PDMP reviewed during this encounter.          Plan: Refer to "POC".  Review of recent tests  DG Foot Complete Right CLINICAL DATA:  Increasing pain in the right 3rd toe. No known injury.  EXAM: RIGHT FOOT COMPLETE - 3+ VIEW  COMPARISON:  None.  FINDINGS: Mild inferior calcaneal spur formation. Otherwise, normal appearing bones and soft tissues. No visible 3rd toe abnormality.  IMPRESSION: Mild inferior calcaneal spur formation. Otherwise, normal examination.  Electronically Signed   By: Claudie Revering M.D.   On: 02/27/2019 11:37   Office  Visit on 04/07/2019  Component Date Value Ref Range Status  . Summary 04/10/2019 FINAL   Final   Comment:  ==================================================================== TOXASSURE COMP DRUG ANALYSIS,UR ==================================================================== Test                             Result       Flag       Units Drug Present   Gabapentin                     PRESENT   Ibuprofen                      PRESENT   Naproxen                       PRESENT   Diphenhydramine                PRESENT ==================================================================== Test                      Result    Flag   Units      Ref Range   Creatinine              41               mg/dL      >=20 ==================================================================== Declared Medications:  Medication list was not provided. ==================================================================== For clinical consultation, please call [phone]. ====================================================================    Assessment  The primary encounter diagnosis was Lumbar spondylosis. Diagnoses of Lumbar facet arthropathy, Degeneration of lumbar or lumbosacral intervertebral disc, Musculoskeletal back pain, Chronic pain syndrome, Bilateral primary osteoarthritis of knee, and Pain management contract signed were also pertinent to this visit.  Plan of Care  I have changed [name] gabapentin. I am also having her start on HYDROcodone-acetaminophen. Additionally, I am having her maintain her omeprazole, valACYclovir, azithromycin, naproxen sodium, and Melatonin.  Sign opioid contract.  Prescription for hydrocodone as below, 90 tablets to last for 2 months (45 tablets/month).  Patient instructed to take 1 to 2 tablets daily PRN moderate to severe pain.  Of also refill her gabapentin as well.  Pharmacotherapy (Medications Ordered): Meds ordered this encounter  Medications  . HYDROcodone-acetaminophen (NORCO/VICODIN) 5-325 MG tablet    Sig: Take 1-2 tablets by mouth daily as needed for  severe pain. Must last 60 days.    Dispense:  90 tablet    Refill:  0    [place] STOP ACT - Not applicable. Fill one day early if pharmacy is closed on scheduled refill date.  . gabapentin (NEURONTIN) 300 MG capsule    Sig: Take 1 capsule (300 mg total) by mouth 2 (two) times daily.    Dispense:  60 capsule    Refill:  2   Orders:  No orders of the defined types were placed in this encounter.  Follow-up plan:   Return in about 8 weeks (around 06/18/2019) for Medication Management.   I discussed the assessment and treatment plan with the patient. The patient was provided an opportunity to ask questions and all were answered. The patient agreed with the plan and demonstrated an understanding of the instructions.  Patient advised to call back or seek an in-person evaluation if the symptoms or condition worsens.  Total duration of non-face-to-face encounter: 62minutes.  Note by: Gillis Santa, MD Date: 04/23/2019; Time: 2:37 PM  Disclaimer:  * Given the  special circumstances of the COVID-19 pandemic, the federal government has announced that the Office for Civil Rights (OCR) will exercise its enforcement discretion and will not impose penalties on physicians using telehealth in the event of noncompliance with regulatory requirements under the [place] (HIPAA) in connection with the good faith provision of telehealth during the IDKSM-84 national public health emergency. ([place])

## 2019-04-27 ENCOUNTER — Telehealth: Payer: Self-pay | Admitting: Student in an Organized Health Care Education/Training Program

## 2019-04-27 NOTE — Telephone Encounter (Signed)
Approved.  

## 2019-04-27 NOTE — Telephone Encounter (Signed)
Pt called stating her pain medication needs PA

## 2019-04-28 ENCOUNTER — Encounter: Payer: BLUE CROSS/BLUE SHIELD | Admitting: Student in an Organized Health Care Education/Training Program

## 2019-06-16 ENCOUNTER — Telehealth: Payer: Self-pay

## 2019-06-16 ENCOUNTER — Ambulatory Visit
Payer: BLUE CROSS/BLUE SHIELD | Attending: Student in an Organized Health Care Education/Training Program | Admitting: Student in an Organized Health Care Education/Training Program

## 2019-06-16 ENCOUNTER — Other Ambulatory Visit: Payer: Self-pay

## 2019-06-16 ENCOUNTER — Encounter: Payer: Self-pay | Admitting: Student in an Organized Health Care Education/Training Program

## 2019-06-16 VITALS — BP 111/83 | HR 86 | Temp 98.1°F | Resp 16 | Ht 68.0 in | Wt 212.0 lb

## 2019-06-16 DIAGNOSIS — M170 Bilateral primary osteoarthritis of knee: Secondary | ICD-10-CM | POA: Insufficient documentation

## 2019-06-16 DIAGNOSIS — M549 Dorsalgia, unspecified: Secondary | ICD-10-CM | POA: Insufficient documentation

## 2019-06-16 DIAGNOSIS — M5137 Other intervertebral disc degeneration, lumbosacral region: Secondary | ICD-10-CM | POA: Diagnosis present

## 2019-06-16 DIAGNOSIS — M47816 Spondylosis without myelopathy or radiculopathy, lumbar region: Secondary | ICD-10-CM | POA: Insufficient documentation

## 2019-06-16 DIAGNOSIS — G894 Chronic pain syndrome: Secondary | ICD-10-CM | POA: Insufficient documentation

## 2019-06-16 MED ORDER — HYDROCODONE-ACETAMINOPHEN 5-325 MG PO TABS: 1.0000 | ORAL_TABLET | Freq: Every day | ORAL | 0 refills | Status: DC | PRN

## 2019-06-16 NOTE — Progress Notes (Signed)
Patient's Name: [name]  MRN: [id number]  Referring Provider: Jodelle Green, FNP  DOB: Jul 17, 1961  PCP: Jodelle Green, FNP  DOS: 06/16/2019  Note by: Gillis Santa, MD  Service setting: Ambulatory outpatient  Attending: Gillis Santa, MD  Location: [facility] (AMB) Pain Management Facility  Specialty: Interventional Pain Management  Patient type: Established   Primary Reason(s) for Visit: Encounter for prescription drug management. (Level of risk: moderate)  CC: Back Pain (2 bulging disc and pinched nerve. lower), Knee Pain (bilateral), Shoulder Pain (right), Ankle Pain (right ), and Hip Pain (left)  HPI  [name] is a 58 y.o. year old, female patient, who comes today for a medication management evaluation. She has Obesity; GERD (gastroesophageal reflux disease); Arthritis; Squamous cell carcinoma of nasolabial fold; Insomnia; Menopause; Chronic pain syndrome; Family history of diabetes mellitus; Family history of heart disease; and Lumbar spondylosis on their problem list. Her primarily concern today is the Back Pain (2 bulging disc and pinched nerve. lower), Knee Pain (bilateral), Shoulder Pain (right), Ankle Pain (right ), and Hip Pain (left)  Pain Assessment: Location: Lower, Left, Right Back(see visit info for additional pain sites) Radiating: left back shoots over to right side Onset: More than a month ago Duration: Chronic pain Quality: Discomfort, Shooting, Constant, Aching Severity: 8 /10 (subjective, self-reported pain score)  Note: Reported level is inconsistent with clinical observations.                         When using our objective Pain Scale, levels between 6 and 10/10 are said to belong in an emergency room, as it progressively worsens from a 6/10, described as severely limiting, requiring emergency care not usually available at an outpatient pain management facility. At a 6/10 level, communication becomes difficult and requires great effort. Assistance to reach the emergency  department may be required. Facial flushing and profuse sweating along with potentially dangerous increases in heart rate and blood pressure will be evident. Effect on ADL: difficulty going down stairs.  hands are hurting when working on computer at work Timing: Constant Modifying factors: hot epsom salt tub bath.  medications BP: 111/83  HR: 86  [name] was last scheduled for an appointment on 04/27/2019 for medication management. During today's appointment we reviewed [name]'s chronic pain status, as well as her outpatient medication regimen.  No change in medical history.  Patient is now endorsing increased knee pain.  She has tried intra-articular knee steroid injections in the past which were helpful for a short period of time.  Patient denies having tried intra-articular Hyalgan injections.  Otherwise continues medications as prescribed.  The patient  reports no history of drug use. Her body mass index is 32.23 kg/m.  Further details on both, my assessment(s), as well as the proposed treatment plan, please see below.  Controlled Substance Pharmacotherapy Assessment REMS (Risk Evaluation and Mitigation Strategy)   04/23/2019  2   04/23/2019  Hydrocodone-Acetamin 5-325 MG  90.00 60 Bi Lat   [id number]   Nor (2541)   0  7.50 MME  Private Pay       Janett Billow, RN  06/16/2019 10:48 AM  Sign when Signing Visit Nursing Pain Medication Assessment:  Safety precautions to be maintained throughout the outpatient stay will include: orient to surroundings, keep bed in low position, maintain call bell within reach at all times, provide assistance with transfer out of bed and ambulation.  Medication Inspection Compliance: Pill count conducted  under aseptic conditions, in front of the patient. Neither the pills nor the bottle was removed from the patient's sight at any time. Once count was completed pills were immediately returned to the patient in their original bottle.  Medication:  Hydrocodone/APAP Pill/Patch Count: 3 of 90 pills remain Pill/Patch Appearance: Markings consistent with prescribed medication Bottle Appearance: Standard pharmacy container. Clearly labeled. Filled Date: 05 / 07 / 2020 Last Medication intake:  Yesterday   Pharmacokinetics: Liberation and absorption (onset of action): WNL Distribution (time to peak effect): WNL Metabolism and excretion (duration of action): WNL         Pharmacodynamics: Desired effects: Analgesia: Ms. Xu reports >50% benefit. Functional ability: Patient reports that medication allows her to accomplish basic ADLs Clinically meaningful improvement in function (CMIF): Sustained CMIF goals met Perceived effectiveness: Described as relatively effective, allowing for increase in activities of daily living (ADL) Undesirable effects: Side-effects or Adverse reactions: None reported Monitoring:  PMP: PDMP reviewed during this encounter. Online review of the past 76-monthperiod conducted. Compliant with practice rules and regulations Last UDS on record: Summary  Date Value Ref Range Status  04/10/2019 FINAL  Final    Comment:    ==================================================================== TOXASSURE COMP DRUG ANALYSIS,UR ==================================================================== Test                             Result       Flag       Units Drug Present   Gabapentin                     PRESENT   Ibuprofen                      PRESENT   Naproxen                       PRESENT   Diphenhydramine                PRESENT ==================================================================== Test                      Result    Flag   Units      Ref Range   Creatinine              41               mg/dL      >=20 ==================================================================== Declared Medications:  Medication list was not  provided. ==================================================================== For clinical consultation, please call [phone] ====================================================================    UDS interpretation: Compliant          Medication Assessment Form: Reviewed. Patient indicates being compliant with therapy Treatment compliance: Compliant Risk Assessment Profile: Aberrant behavior: See initial evaluations. None observed or detected today Comorbid factors increasing risk of overdose: See initial evaluation. No additional risks detected today Opioid risk tool (ORT):  Opioid Risk  04/22/2019  Alcohol 0  Illegal Drugs 0  Rx Drugs 0  Alcohol 0  Illegal Drugs 0  Rx Drugs 0  Age between 16-45 years  0  History of Preadolescent Sexual Abuse 0  Psychological Disease 0  Depression 0  Opioid Risk Tool Scoring 0  Opioid Risk Interpretation Low Risk    ORT Scoring interpretation table:  Score <3 = Low Risk for SUD  Score between 4-7 = Moderate Risk for SUD  Score >8 = High Risk for Opioid Abuse  Risk of substance use disorder (SUD): Low  Risk Mitigation Strategies:  Patient Counseling: Covered Patient-Prescriber Agreement (PPA): Present and active  Notification to other healthcare providers: Done  Pharmacologic Plan: No change in therapy, at this time.             Laboratory Chemistry   SAFETY SCREENING Profile Lab Results  Component Value Date   STAPHAUREUS  10/26/2010    NEGATIVE        The Xpert SA Assay (FDA approved for NASAL specimens only), is one component of a comprehensive surveillance program.  It is not intended to diagnose infection nor to guide or monitor treatment.   MRSAPCR NEGATIVE 10/26/2010   HIV Non Reactive 06/16/2018   PREGTESTUR  11/02/2010    NEGATIVE        THE SENSITIVITY OF THIS METHODOLOGY IS >24 mIU/mL   Inflammation Markers (CRP: Acute Phase) (ESR: Chronic Phase) No results found for: CRP, ESRSEDRATE, LATICACIDVEN                        Rheumatology Markers No results found for: RF, ANA, LABURIC, URICUR, LYMEIGGIGMAB, LYMEABIGMQN, HLAB27                      Renal Function Markers Lab Results  Component Value Date   BUN 8 01/30/2019   CREATININE 0.91 01/30/2019   BCR 12 06/16/2018   GFRAA 92 06/16/2018   GFRNONAA 80 06/16/2018                             Hepatic Function Markers Lab Results  Component Value Date   AST 20 01/30/2019   ALT 26 01/30/2019   ALBUMIN 4.1 01/30/2019   ALKPHOS 91 01/30/2019                        Electrolytes Lab Results  Component Value Date   NA 141 01/30/2019   K 4.2 01/30/2019   CL 106 01/30/2019   CALCIUM 10.0 01/30/2019   MG 1.8 01/30/2019                        Neuropathy Markers Lab Results  Component Value Date   VITAMINB12 524 01/30/2019   FOLATE >24.0 01/30/2019   HGBA1C 5.4 01/30/2019   HIV Non Reactive 06/16/2018                        CNS Tests No results found for: COLORCSF, APPEARCSF, RBCCOUNTCSF, WBCCSF, POLYSCSF, LYMPHSCSF, EOSCSF, PROTEINCSF, GLUCCSF, JCVIRUS, CSFOLI, IGGCSF, LABACHR, ACETBL                      Bone Pathology Markers Lab Results  Component Value Date   VD25OH 34.07 01/30/2019                         Coagulation Parameters Lab Results  Component Value Date   PLT 196.0 01/30/2019   DDIMER 0.29 05/15/2016                        Cardiovascular Markers Lab Results  Component Value Date   HGB 15.1 (H) 01/30/2019   HCT 44.8 01/30/2019  ID Test(s) Lab Results  Component Value Date   HIV Non Reactive 06/16/2018   STAPHAUREUS  10/26/2010    NEGATIVE        The Xpert SA Assay (FDA approved for NASAL specimens only), is one component of a comprehensive surveillance program.  It is not intended to diagnose infection nor to guide or monitor treatment.   MRSAPCR NEGATIVE 10/26/2010   PREGTESTUR  11/02/2010    NEGATIVE        THE SENSITIVITY OF THIS METHODOLOGY IS >24 mIU/mL     CA Markers No results found for: CEA, CA125, LABCA2                      Endocrine Markers Lab Results  Component Value Date   TSH 1.11 01/30/2019                        Note: Lab results reviewed.  Recent Diagnostic Imaging Results  DG Foot Complete Right CLINICAL DATA:  Increasing pain in the right 3rd toe. No known injury.  EXAM: RIGHT FOOT COMPLETE - 3+ VIEW  COMPARISON:  None.  FINDINGS: Mild inferior calcaneal spur formation. Otherwise, normal appearing bones and soft tissues. No visible 3rd toe abnormality.  IMPRESSION: Mild inferior calcaneal spur formation. Otherwise, normal examination.  Electronically Signed   By: Claudie Revering M.D.   On: 02/27/2019 11:37  Complexity Note: Imaging results reviewed. Results shared with Ms. Gasparro, using Layman's terms.                               Meds   Current Outpatient Medications:  .  gabapentin (NEURONTIN) 300 MG capsule, Take 1 capsule (300 mg total) by mouth 2 (two) times daily., Disp: 60 capsule, Rfl: 2 .  HYDROcodone-acetaminophen (NORCO/VICODIN) 5-325 MG tablet, Take 1-2 tablets by mouth daily as needed for severe pain. Must last 60 days., Disp: 90 tablet, Rfl: 0 .  Melatonin 5 MG CAPS, Take 1 capsule by mouth daily., Disp: , Rfl:  .  omeprazole (PRILOSEC) 20 MG capsule, Take 1 capsule (20 mg total) by mouth daily., Disp: 90 capsule, Rfl: 3 .  valACYclovir (VALTREX) 500 MG tablet, Take 500 mg by mouth as needed. , Disp: , Rfl:  .  azithromycin (ZITHROMAX) 250 MG tablet, , Disp: , Rfl:  .  naproxen sodium (ALEVE) 220 MG tablet, Take 440 mg by mouth daily as needed., Disp: , Rfl:   ROS  Constitutional: Denies any fever or chills Gastrointestinal: No reported hemesis, hematochezia, vomiting, or acute GI distress Musculoskeletal: Denies any acute onset joint swelling, redness, loss of ROM, or weakness Neurological: No reported episodes of acute onset apraxia, aphasia, dysarthria, agnosia, amnesia, paralysis,  loss of coordination, or loss of consciousness  Allergies  Ms. Lim has No Known Allergies.  PFSH  Drug: Ms. Eads  reports no history of drug use. Alcohol:  reports previous alcohol use. Tobacco:  reports that she quit smoking about 16 years ago. Her smoking use included cigarettes. She has never used smokeless tobacco. Medical:  has a past medical history of Arthritis, Degenerative disorder of bone, Genuine stress incontinence, female (2012), GERD (gastroesophageal reflux disease), Migraines, and Obesity. Surgical: Ms. Pelly  has a past surgical history that includes Laparoscopic hysterectomy (10/23/2010); Midurethral sling (05/02/2011); and excision of vulvar lesion (05/02/2011). Family: family history includes Arthritis in her maternal grandmother and mother; Cancer in her father  and paternal grandfather; Diabetes in her son; Heart attack in her maternal grandfather, mother, and paternal grandmother; Hypertension in her mother.  Constitutional Exam  General appearance: Well nourished, well developed, and well hydrated. In no apparent acute distress Vitals:   06/16/19 1040  BP: 111/83  Pulse: 86  Resp: 16  Temp: 98.1 F (36.7 C)  TempSrc: Oral  SpO2: 98%  Weight: 212 lb (96.2 kg)  Height: _0  (1.727 m)   BMI Assessment: Estimated body mass index is 32.23 kg/m as calculated from the following:   Height as of this encounter: _1  (1.727 m).   Weight as of this encounter: 212 lb (96.2 kg).  BMI interpretation table: BMI level Category Range association with higher incidence of chronic pain  <18 kg/m2 Underweight   18.5-24.9 kg/m2 Ideal body weight   25-29.9 kg/m2 Overweight Increased incidence by 20%  30-34.9 kg/m2 Obese (Class I) Increased incidence by 68%  35-39.9 kg/m2 Severe obesity (Class II) Increased incidence by 136%  >40 kg/m2 Extreme obesity (Class III) Increased incidence by 254%   Patient's current BMI Ideal Body weight  Body mass index is 32.23 kg/m. Ideal  body weight: 63.9 kg (140 lb 14 oz) Adjusted ideal body weight: 76.8 kg (169 lb 5.2 oz)   BMI Readings from Last 4 Encounters:  06/16/19 32.23 kg/m  02/27/19 32.33 kg/m  01/30/19 32.37 kg/m  06/16/18 31.69 kg/m   Wt Readings from Last 4 Encounters:  06/16/19 212 lb (96.2 kg)  02/27/19 212 lb 9.6 oz (96.4 kg)  01/30/19 209 lb 12.8 oz (95.2 kg)  06/16/18 208 lb 6.4 oz (94.5 kg)  Psych/Mental status: Alert, oriented x 3 (person, place, & time)       Eyes: PERLA Respiratory: No evidence of acute respiratory distress  Cervical Spine Area Exam  Skin & Axial Inspection: No masses, redness, edema, swelling, or associated skin lesions Alignment: Symmetrical Functional ROM: Unrestricted ROM      Stability: No instability detected Muscle Tone/Strength: Functionally intact. No obvious neuro-muscular anomalies detected. Sensory (Neurological): Unimpaired Palpation: No palpable anomalies              Upper Extremity (UE) Exam    Side: Right upper extremity  Side: Left upper extremity  Skin & Extremity Inspection: Skin color, temperature, and hair growth are WNL. No peripheral edema or cyanosis. No masses, redness, swelling, asymmetry, or associated skin lesions. No contractures.  Skin & Extremity Inspection: Skin color, temperature, and hair growth are WNL. No peripheral edema or cyanosis. No masses, redness, swelling, asymmetry, or associated skin lesions. No contractures.  Functional ROM: Unrestricted ROM          Functional ROM: Unrestricted ROM          Muscle Tone/Strength: Functionally intact. No obvious neuro-muscular anomalies detected.  Muscle Tone/Strength: Functionally intact. No obvious neuro-muscular anomalies detected.  Sensory (Neurological): Unimpaired          Sensory (Neurological): Unimpaired          Palpation: No palpable anomalies              Palpation: No palpable anomalies              Provocative Test(s):  Phalen's test: deferred Tinel's test: deferred Apley's  scratch test (touch opposite shoulder):  Action 1 (Across chest): deferred Action 2 (Overhead): deferred Action 3 (LB reach): deferred   Provocative Test(s):  Phalen's test: deferred Tinel's test: deferred Apley's scratch test (touch opposite shoulder):  Action 1 (Across chest):  deferred Action 2 (Overhead): deferred Action 3 (LB reach): deferred    Thoracic Spine Area Exam  Skin & Axial Inspection: No masses, redness, or swelling Alignment: Symmetrical Functional ROM: Unrestricted ROM Stability: No instability detected Muscle Tone/Strength: Functionally intact. No obvious neuro-muscular anomalies detected. Sensory (Neurological): Unimpaired Muscle strength & Tone: No palpable anomalies  Lumbar Spine Area Exam  Skin & Axial Inspection: No masses, redness, or swelling Alignment: Symmetrical Functional ROM: Decreased ROM       Stability: No instability detected Muscle Tone/Strength: Functionally intact. No obvious neuro-muscular anomalies detected. Sensory (Neurological): Musculoskeletal pain pattern Palpation: No palpable anomalies       Provocative Tests: Hyperextension/rotation test: deferred today       Lumbar quadrant test (Kemp's test): deferred today       Lateral bending test: deferred today       Patrick's Maneuver: deferred today                   FABER* test: deferred today                   S-I anterior distraction/compression test: deferred today         S-I lateral compression test: deferred today         S-I Thigh-thrust test: deferred today         S-I Gaenslen's test: deferred today         *(Flexion, ABduction and External Rotation)  Gait & Posture Assessment  Ambulation: Unassisted Gait: Relatively normal for age and body habitus Posture: WNL   Lower Extremity Exam    Side: Right lower extremity  Side: Left lower extremity  Stability: No instability observed          Stability: No instability observed          Skin & Extremity Inspection: Skin color,  temperature, and hair growth are WNL. No peripheral edema or cyanosis. No masses, redness, swelling, asymmetry, or associated skin lesions. No contractures.  Skin & Extremity Inspection: Skin color, temperature, and hair growth are WNL. No peripheral edema or cyanosis. No masses, redness, swelling, asymmetry, or associated skin lesions. No contractures.  Functional ROM: Decreased ROM for hip and knee joints          Functional ROM: Decreased ROM for hip and knee joints          Muscle Tone/Strength: Functionally intact. No obvious neuro-muscular anomalies detected.  Muscle Tone/Strength: Functionally intact. No obvious neuro-muscular anomalies detected.  Sensory (Neurological): Unimpaired        Sensory (Neurological): Unimpaired        DTR: Patellar: deferred today Achilles: deferred today Plantar: deferred today  DTR: Patellar: deferred today Achilles: deferred today Plantar: deferred today  Palpation: No palpable anomalies  Palpation: No palpable anomalies   Assessment   Status Diagnosis  Having a Flare-up Persistent Persistent 1. Bilateral primary osteoarthritis of knee   2. Lumbar spondylosis   3. Musculoskeletal back pain   4. Lumbar facet arthropathy   5. Degeneration of lumbar or lumbosacral intervertebral disc   6. Chronic pain syndrome      Refill of hydrocodone as below.  Given increased knee pain related to bilateral knee osteoarthritis in the setting of already trying intra-articular knee steroid injections, we discussed intra-articular Hyalgan therapy.  Risks and benefits reviewed and patient like to proceed.  Series of intra-articular Hyalgan.  Plan of Care  Pharmacotherapy (Medications Ordered): Meds ordered this encounter  Medications  . HYDROcodone-acetaminophen (NORCO/VICODIN) 5-325 MG  tablet    Sig: Take 1-2 tablets by mouth daily as needed for severe pain. Must last 60 days.    Dispense:  90 tablet    Refill:  0    [place] STOP ACT - Not applicable. Fill one day  early if pharmacy is closed on scheduled refill date.   Orders:  Orders Placed This Encounter  Procedures  . KNEE INJECTION    Hyalgan knee injection. Please order Hyalgan.    Standing Status:   Future    Standing Expiration Date:   07/16/2019    Scheduling Instructions:     Procedure: Intra-articular Hyalgan Knee injection            Side: Bilateral     Sedation: None     Timeframe:asap    Order Specific Question:   Where will this procedure be performed?    Answer:   [facility]   Planned follow-up:   Return in about 8 weeks (around 08/11/2019) for Medication Management.    Recent Visits Date Type Provider Dept  04/23/19 Office Visit Gillis Santa, MD [facility]  04/07/19 Office Visit Gillis Santa, MD [facility]  Showing recent visits within past 90 days and meeting all other requirements   Today's Visits Date Type Provider Dept  06/16/19 Office Visit Gillis Santa, MD [facility]  Showing today's visits and meeting all other requirements   Future Appointments Date Type Provider Dept  07/01/19 Appointment Gillis Santa, MD [facility]  08/13/19 Appointment Gillis Santa, MD [facility]  Showing future appointments within next 90 days and meeting all other requirements   Primary Care Physician: Jodelle Green, FNP Location: [facility] Note by: Gillis Santa, MD Date: 06/16/2019; Time: 2:12 PM  Note: This dictation was prepared with Dragon dictation. Any transcriptional errors that may result from this process are unintentional.

## 2019-06-16 NOTE — Progress Notes (Signed)
Nursing Pain Medication Assessment:  Safety precautions to be maintained throughout the outpatient stay will include: orient to surroundings, keep bed in low position, maintain call bell within reach at all times, provide assistance with transfer out of bed and ambulation.  Medication Inspection Compliance: Pill count conducted under aseptic conditions, in front of the patient. Neither the pills nor the bottle was removed from the patient's sight at any time. Once count was completed pills were immediately returned to the patient in their original bottle.  Medication: Hydrocodone/APAP Pill/Patch Count: 3 of 90 pills remain Pill/Patch Appearance: Markings consistent with prescribed medication Bottle Appearance: Standard pharmacy container. Clearly labeled. Filled Date: 05 / 07 / 2020 Last Medication intake:  Yesterday

## 2019-06-16 NOTE — Telephone Encounter (Signed)
Pt called and states pharmacy cant fill rx unrtil july4th, Dr Holley Raring told her that he can send it for today since she is self pay, Pharmacy said Dr Holley Raring needs to call and they will ok it.

## 2019-06-18 ENCOUNTER — Encounter: Payer: BLUE CROSS/BLUE SHIELD | Admitting: Student in an Organized Health Care Education/Training Program

## 2019-06-24 ENCOUNTER — Encounter: Payer: BLUE CROSS/BLUE SHIELD | Admitting: Student in an Organized Health Care Education/Training Program

## 2019-06-25 ENCOUNTER — Telehealth: Payer: Self-pay | Admitting: *Deleted

## 2019-06-25 ENCOUNTER — Ambulatory Visit (INDEPENDENT_AMBULATORY_CARE_PROVIDER_SITE_OTHER): Payer: BLUE CROSS/BLUE SHIELD | Admitting: Family Medicine

## 2019-06-25 ENCOUNTER — Other Ambulatory Visit: Payer: Self-pay

## 2019-06-25 ENCOUNTER — Ambulatory Visit: Payer: Self-pay | Admitting: Family Medicine

## 2019-06-25 ENCOUNTER — Telehealth: Payer: Self-pay | Admitting: Family Medicine

## 2019-06-25 DIAGNOSIS — R05 Cough: Secondary | ICD-10-CM | POA: Diagnosis not present

## 2019-06-25 DIAGNOSIS — R059 Cough, unspecified: Secondary | ICD-10-CM

## 2019-06-25 DIAGNOSIS — Z20822 Contact with and (suspected) exposure to covid-19: Secondary | ICD-10-CM

## 2019-06-25 DIAGNOSIS — R0981 Nasal congestion: Secondary | ICD-10-CM | POA: Diagnosis not present

## 2019-06-25 DIAGNOSIS — R6889 Other general symptoms and signs: Secondary | ICD-10-CM | POA: Diagnosis not present

## 2019-06-25 DIAGNOSIS — J029 Acute pharyngitis, unspecified: Secondary | ICD-10-CM

## 2019-06-25 DIAGNOSIS — Z20828 Contact with and (suspected) exposure to other viral communicable diseases: Secondary | ICD-10-CM

## 2019-06-25 NOTE — Telephone Encounter (Signed)
Pt reports works in [employer], workplace requiring covid testing. Reports congestion, runny nose, sinus tenderness. Temp yesterday 99.0. Initially with sore throat, not presently. Does have body aches. TN attempted to call practice, placed on hold. Pt states she is pt of Lauren Guse and has seen recently.  TN could not see any encounters.  Please advise: [phone]   Reason for Disposition . COVID-19 Testing, questions about  Answer Assessment - Initial Assessment Questions 1. COVID-19 DIAGNOSIS: "Who made your Coronavirus (COVID-19) diagnosis?" "Was it confirmed by a positive lab test?" If not diagnosed by a HCP, ask "Are there lots of cases (community spread) where you live?" (See public health department website, if unsure)     NA 2. ONSET: "When did the COVID-19 symptoms start?"      Sunday 3. WORST SYMPTOM: "What is your worst symptom?" (e.g., cough, fever, shortness of breath, muscle aches)     "Congested" sinus tenderness, sore throat 4. COUGH: "Do you have a cough?" If so, ask: "How bad is the cough?"       no 5. FEVER: "Do you have a fever?" If so, ask: "What is your temperature, how was it measured, and when did it start?"     99 .0 yesterday 6. RESPIRATORY STATUS: "Describe your breathing?" (e.g., shortness of breath, wheezing, unable to speak)      "stuffy" 7. BETTER-SAME-WORSE: "Are you getting better, staying the same or getting worse compared to yesterday?"  If getting worse, ask, "In what way?"     same 8. HIGH RISK DISEASE: "Do you have any chronic medical problems?" (e.g., asthma, heart or lung disease, weak immune system, etc.)    no  10. OTHER SYMPTOMS: "Do you have any other symptoms?"  (e.g., chills, fatigue, headache, loss of smell or taste, muscle pain, sore throat)       Headache, runny nose and congestion, body aches  Protocols used: CORONAVIRUS (COVID-19) DIAGNOSED OR SUSPECTED-A-AH

## 2019-06-25 NOTE — Telephone Encounter (Signed)
[name]  12-Dec-1961  [id number]  Needs COVID test: scratchy throat, congestion, traveled to [facility], [place]

## 2019-06-25 NOTE — Telephone Encounter (Signed)
Sep 25, 1961  [id number]  Needs COVID test: scratchy throat, congestion, traveled to [facility], [place]  Pt scheduled for tomorrow at [facility] at 9:30. Advised that this is a drive thru test site and she will need to stay in the car with windows rolled up and mask on until time for testing. She voiced understanding.

## 2019-06-25 NOTE — Telephone Encounter (Signed)
Patient c/o of having a Scratcy, itch y sore throat started on Sunday and then started having head congestion and sinus pain a couple of days..  Temp of 99 on yesterday.  Today temp is 97.5  Works in a [employer] engineer.  Went to work today but was sent home.  Employer is requesting pt to get tested for COVID. Went to [facility] tent for testing and was told that she needed an order to be tested.

## 2019-06-25 NOTE — Progress Notes (Signed)
Patient ID: [name], female   DOB: 29-Jun-1961, 58 y.o.   MRN: [id number]    Virtual Visit via video Note  This visit type was conducted due to national recommendations for restrictions regarding the COVID-19 pandemic (e.g. social distancing).  This format is felt to be most appropriate for this patient at this time.  All issues noted in this document were discussed and addressed.  No physical exam was performed (except for noted visual exam findings with Video Visits).   I connected with Pauline Aus today at  2:20 PM EDT by a video enabled telemedicine application and verified that I am speaking with the correct person using two identifiers. Location patient: home Location provider: work or home office Persons participating in the virtual visit: patient, provider  I discussed the limitations, risks, security and privacy concerns of performing an evaluation and management service by video and the availability of in person appointments. I also discussed with the patient that there may be a patient responsible charge related to this service. The patient expressed understanding and agreed to proceed.  HPI:  Patient and I connected via video to discuss COVID-19 symptoms.  Patient states about 4 days ago she began with scratchy throat and nasal congestion and slight cough.  Patient states that scratchy throat seems somewhat improved but continues to have nasal congestion and a little bit of a cough.  Denies any chills, did record a temperature of 99.9 degrees Fahrenheit.  Denies nausea, vomiting or diarrhea.  Denies body aches.  States she feels overall rundown.   ROS: See pertinent positives and negatives per HPI.  Past Medical History:  Diagnosis Date  . Arthritis   . Degenerative disorder of bone   . Genuine stress incontinence, female 2012   patient had surgical repair  . GERD (gastroesophageal reflux disease)   . Migraines   . Obesity     Past Surgical History:  Procedure  Laterality Date  . excision of vulvar lesion  05/02/2011  . LAPAROSCOPIC HYSTERECTOMY  10/23/2010   mennorhagia/fibroids  . Midurethral sling  05/02/2011    Family History  Problem Relation Age of Onset  . Heart attack Mother   . Hypertension Mother   . Arthritis Mother   . Cancer Father   . Diabetes Son   . Arthritis Maternal Grandmother   . Heart attack Maternal Grandfather   . Heart attack Paternal Grandmother   . Cancer Paternal Grandfather    Social History   Tobacco Use  . Smoking status: Former Smoker    Types: Cigarettes    Quit date: 01/28/2003    Years since quitting: 16.4  . Smokeless tobacco: Never Used  Substance Use Topics  . Alcohol use: Not Currently    Comment: socially     Current Outpatient Medications:  .  azithromycin (ZITHROMAX) 250 MG tablet, , Disp: , Rfl:  .  gabapentin (NEURONTIN) 300 MG capsule, Take 1 capsule (300 mg total) by mouth 2 (two) times daily., Disp: 60 capsule, Rfl: 2 .  HYDROcodone-acetaminophen (NORCO/VICODIN) 5-325 MG tablet, Take 1-2 tablets by mouth daily as needed for severe pain. Must last 60 days., Disp: 90 tablet, Rfl: 0 .  Melatonin 5 MG CAPS, Take 1 capsule by mouth daily., Disp: , Rfl:  .  naproxen sodium (ALEVE) 220 MG tablet, Take 440 mg by mouth daily as needed., Disp: , Rfl:  .  omeprazole (PRILOSEC) 20 MG capsule, Take 1 capsule (20 mg total) by mouth daily., Disp: 90 capsule, Rfl: 3 .  valACYclovir (VALTREX) 500 MG tablet, Take 500 mg by mouth as needed. , Disp: , Rfl:   EXAM:  GENERAL: alert, oriented, appears well and in no acute distress  HEENT: atraumatic, conjunttiva clear, no obvious abnormalities on inspection of external nose and ears. Does sound congested in nose when speaking.   NECK: normal movements of the head and neck  LUNGS: on inspection no signs of respiratory distress, breathing rate appears normal, no obvious gross SOB, gasping or wheezing  CV: no obvious cyanosis  MS: moves all visible  extremities without noticeable abnormality  PSYCH/NEURO: pleasant and cooperative, no obvious depression or anxiety, speech and thought processing grossly intact  ASSESSMENT AND PLAN:  Discussed the following assessment and plan:  Suspected COVID-19 virus infection, sore throat, nasal congestion, cough - advised that due to symptoms, we need to get patient set up for COVID-19 testing.  Patient advised that a nurse will be calling her to give her time and location to drive up for drive-through testing.  Patient advised that testing is taking 2 to 7 days to result, and while we are awaiting results patient must remain under self quarantine and monitor for any changing/worsening symptoms.  Advised over-the-counter medications such as Tylenol can be used to help treat pain or fevers, Robitussin can be used to help calm cough, allergy medication such as Claritin or Allegra can help reduce congestion.  Also discussed getting plenty of rest and increasing fluid intake.  Made patient aware that test results as well as how symptoms progress will determine when the self quarantine will be able to end.  Also advised to monitor self for any worsening symptoms, advised if severe shortness of breath develops, high fever that is not reduced with use of Tylenol, chest pain, severe vomiting or diarrhea  --patient must call on-call and or go to ER right away for evaluation. patient verbalized understanding of these instructions.    I discussed the assessment and treatment plan with the patient. The patient was provided an opportunity to ask questions and all were answered. The patient agreed with the plan and demonstrated an understanding of the instructions.   The patient was advised to call back or seek an in-person evaluation if the symptoms worsen or if the condition fails to improve as anticipated.  I provided 15 minutes of face-to-face time during this encounter discussing testing, symptom treatment   Jodelle Green, FNP

## 2019-06-26 ENCOUNTER — Other Ambulatory Visit: Payer: BLUE CROSS/BLUE SHIELD

## 2019-06-26 ENCOUNTER — Other Ambulatory Visit
Admission: RE | Admit: 2019-06-26 | Discharge: 2019-06-26 | Disposition: A | Discharge status: Home / Self Care | Payer: BLUE CROSS/BLUE SHIELD | Source: Ambulatory Visit | Attending: Student in an Organized Health Care Education/Training Program | Admitting: Student in an Organized Health Care Education/Training Program

## 2019-06-26 DIAGNOSIS — Z20822 Contact with and (suspected) exposure to covid-19: Secondary | ICD-10-CM

## 2019-07-01 ENCOUNTER — Encounter: Payer: Self-pay | Admitting: Radiology

## 2019-07-01 ENCOUNTER — Ambulatory Visit: Payer: BLUE CROSS/BLUE SHIELD | Admitting: Student in an Organized Health Care Education/Training Program

## 2019-07-03 LAB — NOVEL CORONAVIRUS, NAA: SARS-CoV-2, NAA: NOT DETECTED

## 2019-07-23 ENCOUNTER — Other Ambulatory Visit: Payer: Self-pay | Admitting: Student in an Organized Health Care Education/Training Program

## 2019-08-11 ENCOUNTER — Encounter: Payer: Self-pay | Admitting: Student in an Organized Health Care Education/Training Program

## 2019-08-11 ENCOUNTER — Telehealth: Payer: Self-pay

## 2019-08-11 NOTE — Telephone Encounter (Signed)
Attempted to call for pre virtual visit questions.  LM

## 2019-08-12 ENCOUNTER — Telehealth: Payer: Self-pay | Admitting: *Deleted

## 2019-08-12 ENCOUNTER — Encounter: Payer: Self-pay | Admitting: Student in an Organized Health Care Education/Training Program

## 2019-08-12 NOTE — Telephone Encounter (Signed)
Patient called and Med rec completed.

## 2019-08-12 NOTE — Progress Notes (Signed)
Patient reports that she has been experiencing painful, pitting edema in lower extremities at the end of the day.  Reports that it is typically resolved over night.  Reports that sometimes left leg is worse than right. 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  States that last evening it was so painful that she had to get off her feet and let her daughter complete the task.  Gabapentin was started in May.   Patient wanted me to leave this note in an effort to remember to talk to Dr Holley Raring about this matter.

## 2019-08-13 ENCOUNTER — Other Ambulatory Visit: Payer: Self-pay

## 2019-08-13 ENCOUNTER — Ambulatory Visit
Payer: BLUE CROSS/BLUE SHIELD | Attending: Student in an Organized Health Care Education/Training Program | Admitting: Student in an Organized Health Care Education/Training Program

## 2019-08-13 DIAGNOSIS — M549 Dorsalgia, unspecified: Secondary | ICD-10-CM

## 2019-08-13 DIAGNOSIS — M47816 Spondylosis without myelopathy or radiculopathy, lumbar region: Secondary | ICD-10-CM

## 2019-08-13 DIAGNOSIS — G894 Chronic pain syndrome: Secondary | ICD-10-CM

## 2019-08-13 DIAGNOSIS — M170 Bilateral primary osteoarthritis of knee: Secondary | ICD-10-CM | POA: Diagnosis not present

## 2019-08-13 MED ORDER — HYDROCODONE-ACETAMINOPHEN 5-325 MG PO TABS: 1.0000 | ORAL_TABLET | Freq: Every day | ORAL | 0 refills | Status: DC | PRN

## 2019-08-13 NOTE — Progress Notes (Signed)
Pain Management Virtual Encounter Note - Virtual Visit via [place] (real-time audio visits between healthcare provider and patient).   Patient's Phone No. & Preferred Pharmacy:  [phone] (home); [phone] (mobile); (Preferred) [phone] [email or web address]  [facility][id number] Lorina Rabon, [facility] [place] [address] [place] [address] Phone: [phone] Fax: [phone]    Pre-screening note:  Our staff contacted [name] and offered her an "in person", "face-to-face" appointment versus a telephone encounter. She indicated preferring the telephone encounter, at this time.   Reason for Virtual Visit: COVID-19*  Social distancing based on CDC and AMA recommendations.   I contacted [name] on 08/13/2019 via video conference.      I clearly identified myself as Gillis Santa, MD. I verified that I was speaking with the correct person using two identifiers (Name: [name], and date of birth: 1961-07-20).  Advanced Informed Consent I sought verbal advanced consent from [name] for virtual visit interactions. I informed [name] of possible security and privacy concerns, risks, and limitations associated with providing "not-in-person" medical evaluation and management services. I also informed [name] of the availability of "in-person" appointments. Finally, I informed her that there would be a charge for the virtual visit and that she could be  personally, fully or partially, financially responsible for it. [name] expressed understanding and agreed to proceed.   Historic Elements   [name] is a 58 y.o. year old, female patient evaluated today after her last encounter by our practice on 08/12/2019. [name]  has a past medical history of Arthritis, Degenerative disorder of bone, Genuine stress incontinence, female (2012), GERD (gastroesophageal reflux disease), Migraines, and Obesity. She also  has a  past surgical history that includes Laparoscopic hysterectomy (10/23/2010); Midurethral sling (05/02/2011); and excision of vulvar lesion (05/02/2011). [name] has a current medication list which includes the following prescription(s): gabapentin, hydrocodone-acetaminophen, melatonin, omeprazole, valacyclovir, azithromycin, and naproxen sodium. She  reports that she quit smoking about 16 years ago. Her smoking use included cigarettes. She has never used smokeless tobacco. She reports previous alcohol use. She reports that she does not use drugs. [name] has No Known Allergies.   HPI  Today, she is being contacted for medication management.   Overall patient is endorsing increase in both of her knees.  She would like to proceed with intra-articular Hyalgan injections that we discussed last time.  Patient is also endorsing swelling in her legs.  This gets worse as the day progresses.  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  Patient's albumin is within normal limits.  Furthermore her dose of gabapentin at 300 mg nightly I do not feel like is contributing to her swelling but we could consider decreasing in the future.  I encouraged the patient to stretch every hour at work and to do basic knee-high exercises to help with blood flow.  I also encouraged her to utilize compression stockings.  Patient's in agreement with plan.  We will also refill her hydrocodone as below.  Pharmacotherapy Assessment  Analgesic:  06/16/2019  2   06/16/2019  Hydrocodone-Acetamin 5-325 MG  90.00 60 Bi Lat   [id number]   Nor ([id number])   0  7.50 MME  Private Pay   [place]      Monitoring: Pharmacotherapy: No side-effects or adverse reactions  reported. [place] PMP: PDMP reviewed during this encounter.       Compliance: No problems identified. Effectiveness: Clinically acceptable. Plan: Refer to  "POC".  UDS:  Summary  Date Value Ref Range Status  04/10/2019 FINAL  Final    Comment:    ==================================================================== [facility] COMP DRUG ANALYSIS,UR ==================================================================== Test                             Result       Flag       Units Drug Present   Gabapentin                     PRESENT   Ibuprofen                      PRESENT   Naproxen                       PRESENT   Diphenhydramine                PRESENT ==================================================================== Test                      Result    Flag   Units      Ref Range   Creatinine              41               mg/dL      >=20 ==================================================================== Declared Medications:  Medication list was not provided. ==================================================================== For clinical consultation, please call [phone]. ====================================================================    Laboratory Chemistry Profile (12 mo)  Renal: 01/30/2019: BUN 8; Creatinine, Ser 0.91  Lab Results  Component Value Date   GFRAA 92 06/16/2018   GFRNONAA 80 06/16/2018   Hepatic: 01/30/2019: Albumin 4.1 Lab Results  Component Value Date   AST 20 01/30/2019   ALT 26 01/30/2019   Other: 01/30/2019: Vitamin B-12 524; VITD 34.07 Note: Above Lab results reviewed.  Imaging  Last 90 days:  No results found.  Assessment  The primary encounter diagnosis was Bilateral primary osteoarthritis of knee. Diagnoses of Lumbar spondylosis, Lumbar facet arthropathy, Musculoskeletal back pain, and Chronic pain syndrome were also pertinent to this visit.  1. Bilateral primary osteoarthritis of knee -Intra-articular Hyalgan, patient has tried intra-articular steroid injections in her knee which were not effective. - KNEE INJECTION; Future  2. Lumbar spondylosis -Discussed lower back  exercises  3. Lumbar facet arthropathy -As above  4. Musculoskeletal back pain -Naproxen for 150 mg daily PRN  5. Chronic pain syndrome -Naproxen as needed, gabapentin 300 mg nightly - HYDROcodone-acetaminophen (NORCO/VICODIN) 5-325 MG tablet; Take 1-2 tablets by mouth daily as needed for severe pain. Must last 60 days.  Dispense: 90 tablet; Refill: 0   Plan of Care  I am having [name] maintain her omeprazole, valACYclovir, azithromycin, naproxen sodium, Melatonin, gabapentin, and HYDROcodone-acetaminophen.  Pharmacotherapy (Medications Ordered): Meds ordered this encounter  Medications  . HYDROcodone-acetaminophen (NORCO/VICODIN) 5-325 MG tablet    Sig: Take 1-2 tablets by mouth daily as needed for severe pain. Must last 60 days.    Dispense:  90 tablet    Refill:  0    [place] STOP ACT - Not applicable. Fill one day early if pharmacy is closed on scheduled refill date.   Orders:  Orders Placed This Encounter  Procedures  . KNEE INJECTION  Hyalgan knee injection. Please order Hyalgan.    Standing Status:   Future    Standing Expiration Date:   09/13/2019    Scheduling Instructions:     Procedure: Intra-articular Hyalgan Knee injection            Side: Bilateral     Sedation: None     Timeframe:ASAP    Order Specific Question:   Where will this procedure be performed?    Answer:   [facility]   Follow-up plan:   Return in about 1 week (around 08/20/2019) for Procedure bilateral intra-articular Hyalgan no sedation in room.     Recent Visits Date Type Provider Dept  06/16/19 Office Visit Gillis Santa, MD [facility]  Showing recent visits within past 90 days and meeting all other requirements   Today's Visits Date Type Provider Dept  08/13/19 Office Visit Gillis Santa, MD [facility]  Showing today's visits and meeting all other requirements   Future Appointments No visits were found meeting these conditions.  Showing future  appointments within next 90 days and meeting all other requirements   I discussed the assessment and treatment plan with the patient. The patient was provided an opportunity to ask questions and all were answered. The patient agreed with the plan and demonstrated an understanding of the instructions.  Patient advised to call back or seek an in-person evaluation if the symptoms or condition worsens.  Total duration of non-face-to-face encounter:11minutes.  Note by: Gillis Santa, MD Date: 08/13/2019; Time: 9:13 AM  Note: This dictation was prepared with Dragon dictation. Any transcriptional errors that may result from this process are unintentional.  Disclaimer:  * Given the special circumstances of the COVID-19 pandemic, the federal government has announced that the Office for Civil Rights (OCR) will exercise its enforcement discretion and will not impose penalties on physicians using telehealth in the event of noncompliance with regulatory requirements under the [place] and [place] (HIPAA) in connection with the good faith provision of telehealth during the XX123456 national public health emergency. ([place])

## 2019-08-19 ENCOUNTER — Encounter: Payer: Self-pay | Admitting: Student in an Organized Health Care Education/Training Program

## 2019-08-19 ENCOUNTER — Other Ambulatory Visit: Payer: Self-pay

## 2019-08-19 ENCOUNTER — Ambulatory Visit
Payer: BLUE CROSS/BLUE SHIELD | Attending: Student in an Organized Health Care Education/Training Program | Admitting: Student in an Organized Health Care Education/Training Program

## 2019-08-19 VITALS — BP 129/80 | HR 84 | Temp 98.4°F | Resp 16 | Ht 68.0 in | Wt 200.0 lb

## 2019-08-19 DIAGNOSIS — M170 Bilateral primary osteoarthritis of knee: Secondary | ICD-10-CM

## 2019-08-19 MED ORDER — LIDOCAINE HCL (PF) 1 % IJ SOLN
5.0000 mL | Freq: Once | INTRAMUSCULAR | Status: AC
  Administered 2019-08-19: 12:00:00 5 mL

## 2019-08-19 MED ORDER — SODIUM HYALURONATE (VISCOSUP) 20 MG/2ML IX SOSY
2.0000 mL | PREFILLED_SYRINGE | Freq: Once | INTRA_ARTICULAR | Status: AC
  Administered 2019-08-19: 2 mL via INTRA_ARTICULAR

## 2019-08-19 MED ORDER — SODIUM HYALURONATE (VISCOSUP) 20 MG/2ML IX SOSY
2.0000 mL | PREFILLED_SYRINGE | Freq: Once | INTRA_ARTICULAR | Status: AC
  Administered 2019-08-19: 12:00:00 2 mL via INTRA_ARTICULAR

## 2019-08-19 NOTE — Progress Notes (Signed)
Patient's Name: [name]  MRN: [id number]  Referring Provider: Jodelle Green, FNP  DOB: 1961-11-18  PCP: Jodelle Green, FNP  DOS: 08/19/2019  Note by: Gillis Santa, MD  Service setting: Ambulatory outpatient  Specialty: Interventional Pain Management  Patient type: Established  Location: [facility] (AMB) [facility] Facility  Visit type: Interventional Procedure   Primary Reason for Visit: Interventional Pain Management Treatment. CC: Knee Pain (bilateral)  Procedure:          Anesthesia, Analgesia, Anxiolysis:  Type: Diagnostic Intra-Articular Hyalgan Knee Injection #1  Region: Medial infrapatellar Knee Region Level: Knee Joint Laterality: Bilateral  Type: Local Anesthesia Indication(s): Analgesia         Local Anesthetic: Lidocaine 1-2% Route: Infiltration (Duncan/IM) IV Access: Declined Sedation: Declined   Position: Sitting   Indications: 1. Bilateral primary osteoarthritis of knee    Pain Score: Pre-procedure: 6 /10 Post-procedure: 6 /10   Pre-op Assessment:  [name] is a 58 y.o. (year old), female patient, seen today for interventional treatment. She  has a past surgical history that includes Laparoscopic hysterectomy (10/23/2010); Midurethral sling (05/02/2011); and excision of vulvar lesion (05/02/2011). [name] has a current medication list which includes the following prescription(s): azithromycin, gabapentin, hydrocodone-acetaminophen, melatonin, naproxen sodium, omeprazole, and valacyclovir. Her primarily concern today is the Knee Pain (bilateral)  Initial Vital Signs:  Pulse/HCG Rate: 84  Temp: 98.4 F (36.9 C) Resp: 18 BP: (!) 150/111 SpO2: 96 %  BMI: Estimated body mass index is 30.41 kg/m as calculated from the following:   Height as of this encounter: 5\' 8"  (1.727 m).   Weight as of this encounter: 200 lb (90.7 kg).  Risk Assessment: Allergies: Reviewed. She has No Known Allergies.  Allergy Precautions: None required Coagulopathies: Reviewed.  None identified.  Blood-thinner therapy: None at this time Active Infection(s): Reviewed. None identified. [name] is afebrile  Site Confirmation: [name] was asked to confirm the procedure and laterality before marking the site Procedure checklist: Completed Consent: Before the procedure and under the influence of no sedative(s), amnesic(s), or anxiolytics, the patient was informed of the treatment options, risks and possible complications. To fulfill our ethical and legal obligations, as recommended by the American Medical Association's Code of Ethics, I have informed the patient of my clinical impression; the nature and purpose of the treatment or procedure; the risks, benefits, and possible complications of the intervention; the alternatives, including doing nothing; the risk(s) and benefit(s) of the alternative treatment(s) or procedure(s); and the risk(s) and benefit(s) of doing nothing. The patient was provided information about the general risks and possible complications associated with the procedure. These may include, but are not limited to: failure to achieve desired goals, infection, bleeding, organ or nerve damage, allergic reactions, paralysis, and death. In addition, the patient was informed of those risks and complications associated to the procedure, such as failure to decrease pain; infection; bleeding; organ or nerve damage with subsequent damage to sensory, motor, and/or autonomic systems, resulting in permanent pain, numbness, and/or weakness of one or several areas of the body; allergic reactions; (i.e.: anaphylactic reaction); and/or death. Furthermore, the patient was informed of those risks and complications associated with the medications. These include, but are not limited to: allergic reactions (i.e.: anaphylactic or anaphylactoid reaction(s)); adrenal axis suppression; blood sugar elevation that in diabetics may result in ketoacidosis or comma; water retention that in  patients with history of congestive heart failure may result in shortness of breath, pulmonary edema, and decompensation with resultant heart  failure; weight gain; swelling or edema; medication-induced neural toxicity; particulate matter embolism and blood vessel occlusion with resultant organ, and/or nervous system infarction; and/or aseptic necrosis of one or more joints. Finally, the patient was informed that Medicine is not an exact science; therefore, there is also the possibility of unforeseen or unpredictable risks and/or possible complications that may result in a catastrophic outcome. The patient indicated having understood very clearly. We have given the patient no guarantees and we have made no promises. Enough time was given to the patient to ask questions, all of which were answered to the patient's satisfaction. [name] has indicated that she wanted to continue with the procedure. Attestation: I, the ordering provider, attest that I have discussed with the patient the benefits, risks, side-effects, alternatives, likelihood of achieving goals, and potential problems during recovery for the procedure that I have provided informed consent. Date  Time: 08/19/2019 11:49 AM  Pre-Procedure Preparation:  Monitoring: As per clinic protocol. Respiration, ETCO2, SpO2, BP, heart rate and rhythm monitor placed and checked for adequate function Safety Precautions: Patient was assessed for positional comfort and pressure points before starting the procedure. Time-out: I initiated and conducted the "Time-out" before starting the procedure, as per protocol. The patient was asked to participate by confirming the accuracy of the "Time Out" information. Verification of the correct person, site, and procedure were performed and confirmed by me, the nursing staff, and the patient. "Time-out" conducted as per Joint Commission's Universal Protocol (UP.01.01.01). Time: 1221  Description of Procedure:           Target Area: Knee Joint Approach: Just above the Medial tibial plateau, lateral to the infrapatellar tendon. Area Prepped: Entire knee area, from the mid-thigh to the mid-shin. Prepping solution: DuraPrep (Iodine Povacrylex [0.7% available iodine] and Isopropyl Alcohol, 74% w/w) Safety Precautions: Aspiration looking for blood return was conducted prior to all injections. At no point did we inject any substances, as a needle was being advanced. No attempts were made at seeking any paresthesias. Safe injection practices and needle disposal techniques used. Medications properly checked for expiration dates. SDV (single dose vial) medications used. Description of the Procedure: Protocol guidelines were followed. The patient was placed in position over the fluoroscopy table. The target area was identified and the area prepped in the usual manner. Skin & deeper tissues infiltrated with local anesthetic. Appropriate amount of time allowed to pass for local anesthetics to take effect. The procedure needles were then advanced to the target area. Proper needle placement secured. Negative aspiration confirmed. Solution injected in intermittent fashion, asking for systemic symptoms every 0.5cc of injectate. The needles were then removed and the area cleansed, making sure to leave some of the prepping solution back to take advantage of its long term bactericidal properties. Vitals:   08/19/19 1152 08/19/19 1229  BP: (!) 150/111 129/80  Pulse: 84 84  Resp: 18 16  Temp: 98.4 F (36.9 C)   TempSrc: Oral   SpO2: 96% 98%  Weight: 200 lb (90.7 kg)   Height: 5\' 8"  (1.727 m)     Start Time: 1221 hrs. End Time: 1225 hrs. Materials:  Needle(s) Type: Regular needle Gauge: 25G Length: 1.5-in Medication(s): Please see orders for medications and dosing details.  Imaging Guidance:          Type of Imaging Technique: None used Indication(s): N/A Exposure Time: No patient exposure Contrast: None  used. Fluoroscopic Guidance: N/A Ultrasound Guidance: N/A Interpretation: N/A  Antibiotic Prophylaxis:   Anti-infectives (From admission, onward)  None     Indication(s): None identified  Post-operative Assessment:  Post-procedure Vital Signs:  Pulse/HCG Rate: 84  Temp: 98.4 F (36.9 C) Resp: 16 BP: 129/80 SpO2: 98 %  EBL: None  Complications: No immediate post-treatment complications observed by team, or reported by patient.  Note: The patient tolerated the entire procedure well. A repeat set of vitals were taken after the procedure and the patient was kept under observation following institutional policy, for this type of procedure. Post-procedural neurological assessment was performed, showing return to baseline, prior to discharge. The patient was provided with post-procedure discharge instructions, including a section on how to identify potential problems. Should any problems arise concerning this procedure, the patient was given instructions to immediately contact us, at any time, without hesitation. In any case, we plan to contact the patient by telephone for a follow-up status report regarding this interventional procedure.  Comments:  No additional relevant information.  Plan of Care  Orders:  Orders Placed This Encounter  Procedures  . KNEE INJECTION    Hyalgan knee injection. Please order Hyalgan.    Standing Status:   Future    Standing Expiration Date:   09/18/2019    Scheduling Instructions:     Procedure: Intra-articular Hyalgan Knee injection            Side: Bilateral     Sedation: None     Timeframe: in (4-5) weeks    Order Specific Question:   Where will this procedure be performed?    Answer:   [facility]   Medications ordered for procedure: Meds ordered this encounter  Medications  . Sodium Hyaluronate SOSY 2 mL  . Sodium Hyaluronate SOSY 2 mL  . lidocaine (PF) (XYLOCAINE) 1 % injection 5 mL   Medications administered: We  administered Sodium Hyaluronate, Sodium Hyaluronate, and lidocaine (PF).  See the medical record for exact dosing, route, and time of administration.  Follow-up plan:   Return in about 5 weeks (around 09/23/2019) for Procedure B/L Hyalgan #2.      s/p IA hyalgan #1 on 08/19/2019   Recent Visits Date Type Provider Dept  08/13/19 Office Visit Gillis Santa, MD [facility]  06/16/19 Office Visit Gillis Santa, MD [facility]  Showing recent visits within past 90 days and meeting all other requirements   Today's Visits Date Type Provider Dept  08/19/19 Procedure visit Gillis Santa, MD [facility]  Showing today's visits and meeting all other requirements   Future Appointments Date Type Provider Dept  09/23/19 Appointment Gillis Santa, MD [facility]  10/13/19 Appointment Gillis Santa, MD [facility]  Showing future appointments within next 90 days and meeting all other requirements   Disposition: Discharge home  Discharge Date & Time: 08/19/2019; 1235 hrs.   Primary Care Physician: Jodelle Green, FNP Location: [facility] Note by: Gillis Santa, MD Date: 08/19/2019; Time: 12:57 PM  Disclaimer:  Medicine is not an exact science. The only guarantee in medicine is that nothing is guaranteed. It is important to note that the decision to proceed with this intervention was based on the information collected from the patient. The Data and conclusions were drawn from the patient's questionnaire, the interview, and the physical examination. Because the information was provided in large part by the patient, it cannot be guaranteed that it has not been purposely or unconsciously manipulated. Every effort has been made to obtain as much relevant data as possible for this evaluation. It  is important to note that the conclusions that lead to this procedure are derived in large part from the available data. Always take into  account that the treatment will also be dependent on availability of resources and existing treatment guidelines, considered by other Pain Management Practitioners as being common knowledge and practice, at the time of the intervention. For Medico-Legal purposes, it is also important to point out that variation in procedural techniques and pharmacological choices are the acceptable norm. The indications, contraindications, technique, and results of the above procedure should only be interpreted and judged by a Board-Certified Interventional Pain Specialist with extensive familiarity and expertise in the same exact procedure and technique.

## 2019-08-19 NOTE — Patient Instructions (Signed)
____________________________________________________________________________________________  Genicular Nerve Block  What is a genicular nerve block? A genicular nerve block is the injection of a local anesthetic to block the nerves that transmits pain from the knee.  What is the purpose of a facet nerve block? A genicular nerve block is a diagnostic procedure to determine if the pathologic changes (i.e. arthritis, meniscal tears, etc) and inflammation within the knee joint is the source of your knee pain. It also confirms that the knee pain will respond well to the actual treatment procedure. If a genicular nerve block works, it will give you relief for several hours. After that, the pain is expected to return to normal. This test is always performed twice (usually a week or two apart) because two successful tests are required to move onto treatment. If both diagnostic tests are positive, then we schedule a treatment called radiofrequency (RF) ablation. In this procedure, the same nerves are cauterized, which typically leads to pain relief for 4 -18 months. If this process works well for one knee, it can be performed on the other knee if needed.  How is the procedure performed? You will be placed on the procedure table. The injection site is sterilized with either iodine or chlorhexadine. The site to be injected is numbed with a local anesthetic, and a needle is directed to the target area. X-ray guidance is used to ensure proper placement and positioning of the needle. When the needle is properly positioned near the genicular nerve, local anesthetic is injected to numb that nerve. This will be repeated at multiple sites around the knee to block all genicular nerves.  Will the procedure be painful? The injection can be painful and we therefore provide the option of receiving IV sedation. IV sedation, combined with local anesthetic, can make the injection nearly pain free. It allows you to remain very  still during the procedure, which can also make the injection easier, faster, and more successful. If you decide to have IV sedation, you must have a driver to get you home safely afterwards. In addition, you cannot have anything to eat or drink within 8 hours of your appointment (clear liquids are allowed until 3 hours before the procedure). If you take medications for diabetes, these medications may need to be adjusted the morning of the procedure. Your primary care physician can help you with this adjustment.  What are the discharge instructions? If you received IV sedation do not drive or operate machinery for at least 24 hours after the procedure. You may return to work the next day following your procedure. You may resume your normal diet immediately. Do not engage in any strenuous activity for 24 hours. You should, however, engage in moderate activity that typically causes your ususal pain. If the block works, those activities should not be painful for several hours after the injection. Do not take a bath, swim, or use a hot tub for 24 hours (you may take a shower). Call the office if you have any of the following: severe pain afterwards (different than your usual symptoms), redness/swelling/discharge at the injection site(s), fevers/chills, difficulty with bowel or bladder functions.  What are the risks and side effects? The complication rate for this procedure is very low. Whenever a needle enters the skin, bleeding or infection can occur. Some other serious but extremely rare risks include paralysis and death. You may have an allergic reaction to any of the medications used. If you have a known allergy to any medications, especially local anesthetics, notify  our staff before the procedure takes place. You may experience any of the following side effects up to 4 - 6 hours after the procedure: Leg muscle weakness or numbness may occur due to the local anesthetic affecting the nerves that control  your legs (this is a temporary affect and it is not paralysis). If you have any leg weakness or numbness, walk only with assistance in order to prevent falls and injury. Your leg strength will return slowly and completely. Dizziness may occur due to a decrease in your blood pressure. If this occurs, remain in a seated or lying position. Gradually sit up, and then stand after at least 10 minutes of sitting. Mild headaches may occur. Drink fluids and take pain medications if needed. If the headaches persist or become severe, call the office. Mild discomfort at the injection site can occur. This typically lasts for a few hours but can persist for a couple days. If this occurs, take anti-inflammatories or pain medications, apply ice to the area the day of the procedure. If it persists, apply moist heat in the day(s) following.  The side effects listed above can be normal. They are not dangerous and will resolve on their own. If, however, you experience any of the following, a complication may have occurred and you should either contact your doctor. If he is not readily available, then you should proceed to the closest urgent care center for evaluation: Severe or progressive pain at the injection site(s) Arm or leg weakness that progressively worsens or persists for longer than 8 hours Severe or progressive redness, swelling, or discharge from the injections site(s) Fevers, chills, nausea, or vomiting Bowel or bladder dysfunction (i.e. inability to urinate or pass stool or difficulty controlling either)  How long does it take for the procedure to work? You should feel relief from your usual pain within the first hour. Again, this is only expected to last for several hours, at the most. Remember, you may be sore in the middle part of your back from the needles, and you must distinguish this from your usual pain. ____________________________________________________________________________________________   Pain Management Discharge Instructions  General Discharge Instructions :  If you need to reach your doctor call: Monday-Friday 8:00 am - 4:00 pm at [phone] or toll free [phone].  After clinic hours [phone] to have operator reach doctor.  Bring all of your medication bottles to all your appointments in the pain clinic.  To cancel or reschedule your appointment with Pain Management please remember to call 24 hours in advance to avoid a fee.  Refer to the educational materials which you have been given on: General Risks, I had my Procedure. Discharge Instructions, Post Sedation.  Post Procedure Instructions:  The drugs you were given will stay in your system until tomorrow, so for the next 24 hours you should not drive, make any legal decisions or drink any alcoholic beverages.  You may eat anything you prefer, but it is better to start with liquids then soups and crackers, and gradually work up to solid foods.  Please notify your doctor immediately if you have any unusual bleeding, trouble breathing or pain that is not related to your normal pain.  Depending on the type of procedure that was done, some parts of your body may feel week and/or numb.  This usually clears up by tonight or the next day.  Walk with the use of an assistive device or accompanied by an adult for the 24 hours.  You may use ice   on the affected area for the first 24 hours.  Put ice in a Ziploc bag and cover with a towel and place against area 15 minutes on 15 minutes off.  You may switch to heat after 24 hours. 

## 2019-08-19 NOTE — Progress Notes (Signed)
Safety precautions to be maintained throughout the outpatient stay will include: orient to surroundings, keep bed in low position, maintain call bell within reach at all times, provide assistance with transfer out of bed and ambulation.  

## 2019-08-20 ENCOUNTER — Telehealth: Payer: Self-pay | Admitting: *Deleted

## 2019-08-20 NOTE — Telephone Encounter (Signed)
Voicemail left with patient to please call office if there are any questions or concerns re; hyalgan injections on yesterday.

## 2019-09-23 ENCOUNTER — Other Ambulatory Visit: Payer: Self-pay

## 2019-09-23 ENCOUNTER — Encounter: Payer: Self-pay | Admitting: Student in an Organized Health Care Education/Training Program

## 2019-09-23 ENCOUNTER — Ambulatory Visit
Payer: BLUE CROSS/BLUE SHIELD | Attending: Student in an Organized Health Care Education/Training Program | Admitting: Student in an Organized Health Care Education/Training Program

## 2019-09-23 VITALS — BP 139/99 | HR 73 | Temp 98.2°F | Resp 18 | Ht 67.0 in | Wt 200.0 lb

## 2019-09-23 DIAGNOSIS — M170 Bilateral primary osteoarthritis of knee: Secondary | ICD-10-CM | POA: Insufficient documentation

## 2019-09-23 DIAGNOSIS — G894 Chronic pain syndrome: Secondary | ICD-10-CM | POA: Diagnosis present

## 2019-09-23 DIAGNOSIS — M5416 Radiculopathy, lumbar region: Secondary | ICD-10-CM | POA: Diagnosis not present

## 2019-09-23 DIAGNOSIS — M5136 Other intervertebral disc degeneration, lumbar region: Secondary | ICD-10-CM | POA: Insufficient documentation

## 2019-09-23 MED ORDER — GABAPENTIN 300 MG PO CAPS: 300.0000 mg | ORAL_CAPSULE | Freq: Two times a day (BID) | ORAL | 2 refills | Status: DC

## 2019-09-23 MED ORDER — LIDOCAINE HCL 2 % IJ SOLN
10.0000 mL | Freq: Once | INTRAMUSCULAR | Status: AC
  Administered 2019-09-23: 200 mg

## 2019-09-23 MED ORDER — LIDOCAINE HCL 2 % IJ SOLN
INTRAMUSCULAR | Status: AC
  Filled 2019-09-23: qty 20

## 2019-09-23 MED ORDER — SODIUM HYALURONATE (VISCOSUP) 20 MG/2ML IX SOSY
2.0000 mL | PREFILLED_SYRINGE | Freq: Once | INTRA_ARTICULAR | Status: AC
  Administered 2019-09-23: 2 mL via INTRA_ARTICULAR

## 2019-09-23 NOTE — Progress Notes (Signed)
Safety precautions to be maintained throughout the outpatient stay will include: orient to surroundings, keep bed in low position, maintain call bell within reach at all times, provide assistance with transfer out of bed and ambulation.  

## 2019-09-23 NOTE — Patient Instructions (Signed)

## 2019-09-24 ENCOUNTER — Other Ambulatory Visit: Payer: Self-pay | Admitting: Student in an Organized Health Care Education/Training Program

## 2019-09-24 ENCOUNTER — Telehealth: Payer: Self-pay | Admitting: *Deleted

## 2019-09-24 ENCOUNTER — Encounter: Payer: Self-pay | Admitting: Student in an Organized Health Care Education/Training Program

## 2019-09-24 ENCOUNTER — Telehealth: Payer: Self-pay

## 2019-09-24 MED ORDER — DIAZEPAM 5 MG PO TABS: 5.0000 mg | ORAL_TABLET | Freq: Once | ORAL | 0 refills | Status: DC | PRN

## 2019-09-24 NOTE — Telephone Encounter (Signed)
Pt was called and message left on answering service. 

## 2019-09-24 NOTE — Progress Notes (Signed)
Patient's Name: [name]  MRN: [id number]  Referring Provider: Jodelle Green, FNP  DOB: Mar 23, 1961  PCP: Jodelle Green, FNP  DOS: 09/23/2019  Note by: Gillis Santa, MD  Service setting: Ambulatory outpatient  Specialty: Interventional Pain Management  Patient type: Established  Location: [facility] (AMB) Pain Management Facility  Visit type: Interventional Procedure   Primary Reason for Visit: Interventional Pain Management Treatment. CC: Knee Pain (bilateral)  Procedure:          Anesthesia, Analgesia, Anxiolysis:  Type: Diagnostic Intra-Articular Hyalgan Knee Injection #2  Region: Medial infrapatellar Knee Region Level: Knee Joint Laterality: Bilateral  Type: Local Anesthesia Indication(s): Analgesia         Local Anesthetic: Lidocaine 1-2% Route: Infiltration ([place]/IM) IV Access: Declined Sedation: Declined   Position: Sitting   Indications: 1. Bilateral primary osteoarthritis of knee   2. Other intervertebral disc degeneration, lumbar region   3. Lumbar radiculopathy   4. Chronic pain syndrome    Pain Score: Pre-procedure: 7 /10 Post-procedure: 0-No pain(right and left knee)/10   Patient returns for intra-articular Hyalgan injection #2.  Is endorsing benefit in her right knee after her first injection without some much so her left knee.  Patient is also endorsing axial low back pain that radiates to bilateral hips that is fairly debilitating for her.  Patient has tried physical therapy in the past which not effective.  We will order lumbar MRI without contrast as below.  Pre-op Assessment:  [name] is a 58 y.o. (year old), female patient, seen today for interventional treatment. She  has a past surgical history that includes Laparoscopic hysterectomy (10/23/2010); Midurethral sling (05/02/2011); and excision of vulvar lesion (05/02/2011). [name] has a current medication list which includes the following prescription(s): gabapentin, hydrocodone-acetaminophen, melatonin,  naproxen sodium, omeprazole, valacyclovir, and azithromycin. Her primarily concern today is the Knee Pain (bilateral)  Initial Vital Signs:  Pulse/HCG Rate: 75  Temp: 98.2 F (36.8 C) Resp: 18 BP: (!) 143/96 SpO2: 100 %  BMI: Estimated body mass index is 31.32 kg/m as calculated from the following:   Height as of this encounter: 5\' 7"  (1.702 m).   Weight as of this encounter: 200 lb (90.7 kg).  Risk Assessment: Allergies: Reviewed. She has No Known Allergies.  Allergy Precautions: None required Coagulopathies: Reviewed. None identified.  Blood-thinner therapy: None at this time Active Infection(s): Reviewed. None identified. [name] is afebrile  Site Confirmation: [name] was asked to confirm the procedure and laterality before marking the site Procedure checklist: Completed Consent: Before the procedure and under the influence of no sedative(s), amnesic(s), or anxiolytics, the patient was informed of the treatment options, risks and possible complications. To fulfill our ethical and legal obligations, as recommended by the American Medical Association's Code of Ethics, I have informed the patient of my clinical impression; the nature and purpose of the treatment or procedure; the risks, benefits, and possible complications of the intervention; the alternatives, including doing nothing; the risk(s) and benefit(s) of the alternative treatment(s) or procedure(s); and the risk(s) and benefit(s) of doing nothing. The patient was provided information about the general risks and possible complications associated with the procedure. These may include, but are not limited to: failure to achieve desired goals, infection, bleeding, organ or nerve damage, allergic reactions, paralysis, and death. In addition, the patient was informed of those risks and complications associated to the procedure, such as failure to decrease pain; infection; bleeding; organ or nerve damage with subsequent damage to  sensory, motor,  and/or autonomic systems, resulting in permanent pain, numbness, and/or weakness of one or several areas of the body; allergic reactions; (i.e.: anaphylactic reaction); and/or death. Furthermore, the patient was informed of those risks and complications associated with the medications. These include, but are not limited to: allergic reactions (i.e.: anaphylactic or anaphylactoid reaction(s)); adrenal axis suppression; blood sugar elevation that in diabetics may result in ketoacidosis or comma; water retention that in patients with history of congestive heart failure may result in shortness of breath, pulmonary edema, and decompensation with resultant heart failure; weight gain; swelling or edema; medication-induced neural toxicity; particulate matter embolism and blood vessel occlusion with resultant organ, and/or nervous system infarction; and/or aseptic necrosis of one or more joints. Finally, the patient was informed that Medicine is not an exact science; therefore, there is also the possibility of unforeseen or unpredictable risks and/or possible complications that may result in a catastrophic outcome. The patient indicated having understood very clearly. We have given the patient no guarantees and we have made no promises. Enough time was given to the patient to ask questions, all of which were answered to the patient's satisfaction. [name] has indicated that she wanted to continue with the procedure. Attestation: I, the ordering provider, attest that I have discussed with the patient the benefits, risks, side-effects, alternatives, likelihood of achieving goals, and potential problems during recovery for the procedure that I have provided informed consent. Date  Time: 09/23/2019  1:49 PM  Pre-Procedure Preparation:  Monitoring: As per clinic protocol. Respiration, ETCO2, SpO2, BP, heart rate and rhythm monitor placed and checked for adequate function Safety Precautions: Patient was  assessed for positional comfort and pressure points before starting the procedure. Time-out: I initiated and conducted the "Time-out" before starting the procedure, as per protocol. The patient was asked to participate by confirming the accuracy of the "Time Out" information. Verification of the correct person, site, and procedure were performed and confirmed by me, the nursing staff, and the patient. "Time-out" conducted as per Joint Commission's Universal Protocol (UP.01.01.01). Time: 1419  Description of Procedure:          Target Area: Knee Joint Approach: Just above the Medial tibial plateau, lateral to the infrapatellar tendon. Area Prepped: Entire knee area, from the mid-thigh to the mid-shin. Prepping solution: DuraPrep (Iodine Povacrylex [0.7% available iodine] and Isopropyl Alcohol, 74% w/w) Safety Precautions: Aspiration looking for blood return was conducted prior to all injections. At no point did we inject any substances, as a needle was being advanced. No attempts were made at seeking any paresthesias. Safe injection practices and needle disposal techniques used. Medications properly checked for expiration dates. SDV (single dose vial) medications used. Description of the Procedure: Protocol guidelines were followed. The patient was placed in position over the fluoroscopy table. The target area was identified and the area prepped in the usual manner. Skin & deeper tissues infiltrated with local anesthetic. Appropriate amount of time allowed to pass for local anesthetics to take effect. The procedure needles were then advanced to the target area. Proper needle placement secured. Negative aspiration confirmed. Solution injected in intermittent fashion, asking for systemic symptoms every 0.5cc of injectate. The needles were then removed and the area cleansed, making sure to leave some of the prepping solution back to take advantage of its long term bactericidal properties. Vitals:   09/23/19  1353 09/23/19 1355 09/23/19 1430  BP:  (!) 143/96 (!) 139/99  Pulse: 75  73  Resp: 18  18  Temp: 98.2 F (36.8  C)    SpO2: 100%  100%  Weight: 200 lb (90.7 kg)    Height: 5\' 7"  (1.702 m)      Start Time: 1419 hrs. End Time: 1426 hrs. Materials:  Needle(s) Type: Regular needle Gauge: 25G Length: 1.5-in Medication(s): Please see orders for medications and dosing details.  Imaging Guidance:          Type of Imaging Technique: None used Indication(s): N/A Exposure Time: No patient exposure Contrast: None used. Fluoroscopic Guidance: N/A Ultrasound Guidance: N/A Interpretation: N/A  Antibiotic Prophylaxis:   Anti-infectives (From admission, onward)   None     Indication(s): None identified  Post-operative Assessment:  Post-procedure Vital Signs:  Pulse/HCG Rate: 73  Temp: 98.2 F (36.8 C) Resp: 18 BP: (!) 139/99 SpO2: 100 %  EBL: None  Complications: No immediate post-treatment complications observed by team, or reported by patient.  Note: The patient tolerated the entire procedure well. A repeat set of vitals were taken after the procedure and the patient was kept under observation following institutional policy, for this type of procedure. Post-procedural neurological assessment was performed, showing return to baseline, prior to discharge. The patient was provided with post-procedure discharge instructions, including a section on how to identify potential problems. Should any problems arise concerning this procedure, the patient was given instructions to immediately contact us, at any time, without hesitation. In any case, we plan to contact the patient by telephone for a follow-up status report regarding this interventional procedure.  Comments:  No additional relevant information.  Plan of Care  Orders:  Orders Placed This Encounter  Procedures  . KNEE INJECTION    Hyalgan knee injection. Please order Hyalgan.    Standing Status:   Future    Standing  Expiration Date:   10/24/2019    Scheduling Instructions:     Procedure: Intra-articular Hyalgan Knee injection    #3        Side: Bilateral     Sedation: None     Timeframe: in 4 weeks    Order Specific Question:   Where will this procedure be performed?    Answer:   [facility]  . MR LUMBAR SPINE WO CONTRAST    In addition to any acute findings, please report on degenerative changes related to: (Please specify level(s)) (1) ROM & instability (>70mm displacement) (2) Facet joint (Zygoapophyseal Joint) (3) DDD and/or IVDD (4) Pars defects (5) Previous surgical changes (Include description of hardware and hardware status, if present) (6) Presence and degree of spondylolisthesis, spondylosis, and/or spondyloarthropathies)  (7) Old Fractures (8) Demineralization (9) Additional bone pathology (10) Stenosis (Central, Lateral Recess, Foraminal) (11) If at all possible, please provide AP diameter (mm) of foraminal and/or central canal.    Standing Status:   Future    Standing Expiration Date:   12/24/2019    Order Specific Question:   What is the patient's sedation requirement?    Answer:   No Sedation    Order Specific Question:   Does the patient have a pacemaker or implanted devices?    Answer:   No    Order Specific Question:   Preferred imaging location?    Answer:   [facility] (table limit-350lbs)    Order Specific Question:   Call Results- Best Contact Number?    Answer:   [phone] ([facility])    Order Specific Question:   Radiology Contrast Protocol - do NOT remove file path    Answer:   [email or web address]   Medications  ordered for procedure: Meds ordered this encounter  Medications  . Sodium Hyaluronate SOSY 2 mL  . Sodium Hyaluronate SOSY 2 mL  . lidocaine (XYLOCAINE) 2 % (with pres) injection 200 mg  . gabapentin (NEURONTIN) 300 MG capsule    Sig: Take 1 capsule (300 mg total) by mouth 2 (two) times daily.     Dispense:  60 capsule    Refill:  2   Medications administered: We administered Sodium Hyaluronate, Sodium Hyaluronate, and lidocaine.  See the medical record for exact dosing, route, and time of administration.  Follow-up plan:   Return in about 4 weeks (around 10/21/2019) for Procedure B/L Hyalgan injection #3.      s/p IA hyalgan #1 on 08/19/2019, #2 09/23/2019   Recent Visits Date Type Provider Dept  09/23/19 Procedure visit Gillis Santa, MD [facility]  08/19/19 Procedure visit Gillis Santa, MD [facility]  08/13/19 Office Visit Gillis Santa, MD [facility]  Showing recent visits within past 90 days and meeting all other requirements   Future Appointments Date Type Provider Dept  10/13/19 Appointment Gillis Santa, MD [facility]  11/04/19 Appointment Gillis Santa, MD [facility]  Showing future appointments within next 90 days and meeting all other requirements   Disposition: Discharge home  Discharge Date & Time: 09/23/2019; 1441 hrs.   Primary Care Physician: Jodelle Green, FNP Location: [facility] Note by: Gillis Santa, MD Date: 09/23/2019; Time: 8:35 AM  Disclaimer:  Medicine is not an exact science. The only guarantee in medicine is that nothing is guaranteed. It is important to note that the decision to proceed with this intervention was based on the information collected from the patient. The Data and conclusions were drawn from the patient's questionnaire, the interview, and the physical examination. Because the information was provided in large part by the patient, it cannot be guaranteed that it has not been purposely or unconsciously manipulated. Every effort has been made to obtain as much relevant data as possible for this evaluation. It is important to note that the conclusions that lead to this procedure are derived in large part from the available data. Always take into account that  the treatment will also be dependent on availability of resources and existing treatment guidelines, considered by other Pain Management Practitioners as being common knowledge and practice, at the time of the intervention. For Medico-Legal purposes, it is also important to point out that variation in procedural techniques and pharmacological choices are the acceptable norm. The indications, contraindications, technique, and results of the above procedure should only be interpreted and judged by a Board-Certified Interventional Pain Specialist with extensive familiarity and expertise in the same exact procedure and technique.

## 2019-09-24 NOTE — Progress Notes (Signed)
Valium Rx for anxiety prior to MRI.  Requested Prescriptions   Signed Prescriptions Disp Refills  . diazepam (VALIUM) 5 MG tablet 1 tablet 0    Sig: Take 1 tablet (5 mg total) by mouth once as needed for up to 1 dose for anxiety (PRIOR to MRI).

## 2019-09-25 ENCOUNTER — Telehealth: Payer: Self-pay | Admitting: *Deleted

## 2019-10-01 ENCOUNTER — Ambulatory Visit: Admission: RE | Admit: 2019-10-01 | Payer: BLUE CROSS/BLUE SHIELD | Source: Ambulatory Visit

## 2019-10-12 ENCOUNTER — Encounter: Payer: Self-pay | Admitting: Student in an Organized Health Care Education/Training Program

## 2019-10-12 NOTE — Progress Notes (Signed)
Patient would like Dr Holley Raring to know that she did go for MRI appt but was unable to afford the cost.  Would like to have this done and is going to look into different insurance plan with better coverage for things of this nature.

## 2019-10-13 ENCOUNTER — Encounter: Payer: Self-pay | Admitting: Student in an Organized Health Care Education/Training Program

## 2019-10-13 ENCOUNTER — Ambulatory Visit
Payer: BLUE CROSS/BLUE SHIELD | Attending: Student in an Organized Health Care Education/Training Program | Admitting: Student in an Organized Health Care Education/Training Program

## 2019-10-13 ENCOUNTER — Other Ambulatory Visit: Payer: Self-pay

## 2019-10-13 DIAGNOSIS — G8929 Other chronic pain: Secondary | ICD-10-CM | POA: Insufficient documentation

## 2019-10-13 DIAGNOSIS — G894 Chronic pain syndrome: Secondary | ICD-10-CM | POA: Diagnosis not present

## 2019-10-13 DIAGNOSIS — M533 Sacrococcygeal disorders, not elsewhere classified: Secondary | ICD-10-CM

## 2019-10-13 DIAGNOSIS — M25552 Pain in left hip: Secondary | ICD-10-CM

## 2019-10-13 DIAGNOSIS — M170 Bilateral primary osteoarthritis of knee: Secondary | ICD-10-CM | POA: Insufficient documentation

## 2019-10-13 DIAGNOSIS — M25551 Pain in right hip: Secondary | ICD-10-CM

## 2019-10-13 DIAGNOSIS — M47816 Spondylosis without myelopathy or radiculopathy, lumbar region: Secondary | ICD-10-CM

## 2019-10-13 HISTORY — DX: Other chronic pain: G89.29

## 2019-10-13 HISTORY — DX: Bilateral primary osteoarthritis of knee: M17.0

## 2019-10-13 HISTORY — DX: Pain in right hip: M25.551

## 2019-10-13 MED ORDER — DULOXETINE HCL 20 MG PO CPEP: ORAL_CAPSULE | ORAL | 2 refills | Status: DC

## 2019-10-13 MED ORDER — HYDROCODONE-ACETAMINOPHEN 5-325 MG PO TABS: 1.0000 | ORAL_TABLET | Freq: Every day | ORAL | 0 refills | Status: DC | PRN

## 2019-10-13 NOTE — Progress Notes (Signed)
Pain Management Virtual Encounter Note - Virtual Visit via [place] (real-time audio visits between healthcare provider and patient).   Patient's Phone No. & Preferred Pharmacy:  [phone] (home); [phone] (mobile); (Preferred) [phone] [email or web address]  [id number] Lorina Rabon, [facility] [address] [place] [address] Phone: [phone] Fax: [phone]    Pre-screening note:  Our staff contacted [name] and offered her an "in person", "face-to-face" appointment versus a telephone encounter. She indicated preferring the telephone encounter, at this time.   Reason for Virtual Visit: COVID-19*  Social distancing based on CDC and AMA recommendations.   I contacted Servando Snare on 10/13/2019 via video conference.      I clearly identified myself as Gillis Santa, MD. I verified that I was speaking with the correct person using two identifiers (Name: [name], and date of birth: March 26, 1961).  Advanced Informed Consent I sought verbal advanced consent from Servando Snare for virtual visit interactions. I informed [name] of possible security and privacy concerns, risks, and limitations associated with providing "not-in-person" medical evaluation and management services. I also informed [name] of the availability of "in-person" appointments. Finally, I informed her that there would be a charge for the virtual visit and that she could be  personally, fully or partially, financially responsible for it. [name] expressed understanding and agreed to proceed.   Historic Elements   [name] is a 58 y.o. year old, female patient evaluated today after her last encounter by our practice on 09/25/2019. [name]  has a past medical history of Arthritis, Degenerative disorder of bone, Genuine stress incontinence, female (2012), GERD (gastroesophageal reflux disease), Migraines, and Obesity. She also  has a  past surgical history that includes Laparoscopic hysterectomy (10/23/2010); Midurethral sling (05/02/2011); and excision of vulvar lesion (05/02/2011). [name] has a current medication list which includes the following prescription(s): gabapentin, hydrocodone-acetaminophen, melatonin, omeprazole, valacyclovir, azithromycin, duloxetine, and naproxen sodium. She  reports that she quit smoking about 16 years ago. Her smoking use included cigarettes. She has never used smokeless tobacco. She reports previous alcohol use. She reports that she does not use drugs. [name] has No Known Allergies.   HPI  Today, she is being contacted for medication management.   Patient unable to complete MRI due to insurance costs, is not able to afford Having increased pain in her right low back, right buttock. Also having Right hip pain. Denies groin pain. States that IA knee hyalgan is helping out somewhat although patient denies significant pain relief.  Plan for #3 next month Continues gabapentin 300 mg nightly, hydrocodone 5 mg daily to twice daily as needed  Pharmacotherapy Assessment  Analgesic:  08/13/2019  1   08/13/2019  Hydrocodone-Acetamin 5-325 MG  90.00  45 Bi Lat   [id number]   Nor (2541)   0  10.00 MME  Private Pay   [place]    Monitoring: Pharmacotherapy: No side-effects or adverse reactions reported. [place] PMP: PDMP reviewed during this encounter.       Compliance: No problems identified. Effectiveness: Clinically acceptable. Plan: Refer to "POC".  UDS:  Summary  Date Value Ref Range Status  04/10/2019 FINAL  Final    Comment:    ==================================================================== TOXASSURE COMP DRUG ANALYSIS,UR ==================================================================== Test                             Result       Flag  Units Drug Present   Gabapentin                     PRESENT   Ibuprofen                      PRESENT   Naproxen                       PRESENT    Diphenhydramine                PRESENT ==================================================================== Test                      Result    Flag   Units      Ref Range   Creatinine              41               mg/dL      >=20 ==================================================================== Declared Medications:  Medication list was not provided. ==================================================================== For clinical consultation, please call [phone]. ====================================================================    Laboratory Chemistry Profile (12 mo)  Renal: 01/30/2019: BUN 8; Creatinine, Ser 0.91  Lab Results  Component Value Date   GFR 64.07 01/30/2019   GFRAA 92 06/16/2018   GFRNONAA 80 06/16/2018   Hepatic: 01/30/2019: Albumin 4.1 Lab Results  Component Value Date   AST 20 01/30/2019   ALT 26 01/30/2019   Other: 01/30/2019: Vitamin B-12 524; VITD 34.07 Note: Above Lab results reviewed.  Assessment  The primary encounter diagnosis was Chronic pain syndrome. Diagnoses of Bilateral primary osteoarthritis of knee, Bilateral hip pain, Chronic SI joint pain, and Lumbar spondylosis were also pertinent to this visit.  Plan of Care  I have discontinued [name]'s diazepam. I am also having her start on DULoxetine. Additionally, I am having her maintain her omeprazole, valACYclovir, azithromycin, naproxen sodium, Melatonin, gabapentin, and HYDROcodone-acetaminophen.  1. Chronic pain syndrome -Continue gabapentin 200 mg nightly - HYDROcodone-acetaminophen (NORCO/VICODIN) 5-325 MG tablet; Take 1-2 tablets by mouth daily as needed for severe pain. Must last 60 days.  Dispense: 90 tablet; Refill: 0 - DULoxetine (CYMBALTA) 20 MG capsule; Take 1 capsule (20 mg total) by mouth daily for 30 days, THEN 2 capsules (40 mg total) daily.  Dispense: 30 capsule; Refill: 2  2. Bilateral primary osteoarthritis of knee -Status post bilateral intra-articular  Hyalgan knee injection #2 on 09/23/2019.  States that she is obtaining mild pain relief.  Plan for #3 next month  3. Bilateral hip pain - DG HIP UNILAT W OR W/O PELVIS 2-3 VIEWS RIGHT; Future - DG HIP UNILAT W OR W/O PELVIS 2-3 VIEWS LEFT; Future  4. Chronic SI joint pain - DG Si Joints; Future  5. Lumbar spondylosis -Ordered lumbar MRI for patient's symptoms but patient unable to afford.   Pharmacotherapy (Medications Ordered): Meds ordered this encounter  Medications  . HYDROcodone-acetaminophen (NORCO/VICODIN) 5-325 MG tablet    Sig: Take 1-2 tablets by mouth daily as needed for severe pain. Must last 60 days.    Dispense:  90 tablet    Refill:  0    [place] STOP ACT - Not applicable. Fill one day early if pharmacy is closed on scheduled refill date.  . DULoxetine (CYMBALTA) 20 MG capsule    Sig: Take 1 capsule (20 mg total) by mouth daily for 30 days, THEN 2 capsules (40 mg total) daily.    Dispense:  30 capsule  Refill:  2   Orders:  Orders Placed This Encounter  Procedures  . DG HIP UNILAT W OR W/O PELVIS 2-3 VIEWS RIGHT    Standing Status:   Future    Standing Expiration Date:   10/12/2020    Scheduling Instructions:     Please describe any evidence of DJD, such as joint narrowing, asymmetry, cysts, or any anomalies in bone density, production, or erosion.    Order Specific Question:   Reason for Exam (SYMPTOM  OR DIAGNOSIS REQUIRED)    Answer:   Right hip pain/arthralgia    Order Specific Question:   Is the patient pregnant?    Answer:   No    Order Specific Question:   Preferred imaging location?    Answer:   [facility]    Order Specific Question:   Call Results- Best Contact Number?    Answer:   [id number] (Pain Clinic facility) (Dr. Dossie Arbour)  . DG HIP UNILAT W OR W/O PELVIS 2-3 VIEWS LEFT    Standing Status:   Future    Standing Expiration Date:   10/12/2020    Scheduling Instructions:     Please describe any evidence of DJD, such as joint narrowing,  asymmetry, cysts, or any anomalies in bone density, production, or erosion.    Order Specific Question:   Reason for Exam (SYMPTOM  OR DIAGNOSIS REQUIRED)    Answer:   Right hip pain/arthralgia    Order Specific Question:   Is the patient pregnant?    Answer:   No    Order Specific Question:   Preferred imaging location?    Answer:   [facility]    Order Specific Question:   Call Results- Best Contact Number?    Answer:   [id number] (Pain Clinic facility) (Dr. Dossie Arbour)  . DG Si Joints    Standing Status:   Future    Standing Expiration Date:   01/13/2020    Order Specific Question:   Reason for Exam (SYMPTOM  OR DIAGNOSIS REQUIRED)    Answer:   Right hip pain/arthralgia    Order Specific Question:   Is the patient pregnant?    Answer:   No    Order Specific Question:   Preferred imaging location?    Answer:   [facility]    Order Specific Question:   Call Results- Best Contact Number?    Answer:   [phone] [facility])   Follow-up plan:   Return in about 8 weeks (around 12/08/2019) for Medication Management.     s/p IA hyalgan #1 on 08/19/2019, #2 09/23/2019    Recent Visits Date Type Provider Dept  09/23/19 Procedure visit Gillis Santa, MD [facility]  08/19/19 Procedure visit Gillis Santa, MD [facility]  08/13/19 Office Visit Gillis Santa, MD [facility]  Showing recent visits within past 90 days and meeting all other requirements   Today's Visits Date Type Provider Dept  10/13/19 Office Visit Gillis Santa, MD [facility]  Showing today's visits and meeting all other requirements   Future Appointments Date Type Provider Dept  11/04/19 Appointment Gillis Santa, MD [facility]  Showing future appointments within next 90 days and meeting all other requirements   I discussed the assessment and treatment plan with the patient. The patient was provided an opportunity to ask questions and all were  answered. The patient agreed with the plan and demonstrated an understanding of the instructions.  Patient advised to call back  or seek an in-person evaluation if the symptoms or condition worsens.  Total duration of non-face-to-face encounter: 2minutes.  Note by: Gillis Santa, MD Date: 10/13/2019; Time: 8:05 AM  Note: This dictation was prepared with Dragon dictation. Any transcriptional errors that may result from this process are unintentional.  Disclaimer:  * Given the special circumstances of the COVID-19 pandemic, the federal government has announced that the Office for Civil Rights (OCR) will exercise its enforcement discretion and will not impose penalties on physicians using telehealth in the event of noncompliance with regulatory requirements under the [place] and [place] (HIPAA) in connection with the good faith provision of telehealth during the XX123456 national public health emergency. ([place])

## 2019-10-27 ENCOUNTER — Telehealth: Payer: Self-pay | Admitting: Student in an Organized Health Care Education/Training Program

## 2019-10-27 NOTE — Telephone Encounter (Signed)
Patient states she seems to be having problems with sleep since she started taking the Cymbalta. Seems to be increasing. Already uses Melatonin. Would like to know Dr. Elmon Else suggestions.

## 2019-10-27 NOTE — Telephone Encounter (Signed)
Is she taking Cymbalta during the day or at night?  She is on a fairly low dose, I recommend she take it early in the day to see if it helps. If it doesn't, it's up to her if she wants to continue or stop.

## 2019-10-27 NOTE — Telephone Encounter (Signed)
Dr. Holley Raring Patient states since starting Cymbalta 20mg  approx 12 days ago she has noticed increased inability to stay asleep at night. She is also taking Gabapentin 300 mg at HS. She is unable to increase the Gabapentin dose due to LE swelling when she tried to increase. She is taking Melatonin 10mg  at night also. She really does not want to stop the Cymbalta because she thinks it is really helping her symptoms but is concerned about not getting adequate sleep at night. Please advise.

## 2019-10-27 NOTE — Telephone Encounter (Signed)
She takes it in the morning and has since the beginning of therapy. Is her only option to help with the insomnia to stop it?

## 2019-10-27 NOTE — Telephone Encounter (Signed)
yes

## 2019-10-27 NOTE — Telephone Encounter (Signed)
Returned patient's call. No answer. LVM for her to return call.

## 2019-10-28 ENCOUNTER — Telehealth: Payer: Self-pay | Admitting: *Deleted

## 2019-10-28 NOTE — Telephone Encounter (Signed)
Attempted to call patient.  LM 

## 2019-10-28 NOTE — Telephone Encounter (Signed)
Called patient and given Dr. Elwyn Lade advise. She has opted to stay on the medication and see if the side effect goes away and try to take it earlier in the day. Will call with any issues.

## 2019-11-03 ENCOUNTER — Other Ambulatory Visit: Payer: Self-pay | Admitting: Student in an Organized Health Care Education/Training Program

## 2019-11-03 DIAGNOSIS — G894 Chronic pain syndrome: Secondary | ICD-10-CM

## 2019-11-04 ENCOUNTER — Ambulatory Visit: Payer: BLUE CROSS/BLUE SHIELD | Admitting: Student in an Organized Health Care Education/Training Program

## 2019-12-07 ENCOUNTER — Encounter: Payer: Self-pay | Admitting: Student in an Organized Health Care Education/Training Program

## 2019-12-07 ENCOUNTER — Telehealth: Payer: Self-pay

## 2019-12-07 NOTE — Telephone Encounter (Signed)
Attempted to call patient for pre virtual appointment questions.  I called both numbers and neither one had an answer or answering machine.

## 2019-12-08 ENCOUNTER — Encounter: Payer: Self-pay | Admitting: Student in an Organized Health Care Education/Training Program

## 2019-12-08 ENCOUNTER — Ambulatory Visit
Payer: BLUE CROSS/BLUE SHIELD | Attending: Student in an Organized Health Care Education/Training Program | Admitting: Student in an Organized Health Care Education/Training Program

## 2019-12-08 ENCOUNTER — Other Ambulatory Visit: Payer: Self-pay

## 2019-12-08 ENCOUNTER — Telehealth: Payer: Self-pay

## 2019-12-08 DIAGNOSIS — M170 Bilateral primary osteoarthritis of knee: Secondary | ICD-10-CM | POA: Diagnosis not present

## 2019-12-08 DIAGNOSIS — M47816 Spondylosis without myelopathy or radiculopathy, lumbar region: Secondary | ICD-10-CM

## 2019-12-08 DIAGNOSIS — M533 Sacrococcygeal disorders, not elsewhere classified: Secondary | ICD-10-CM

## 2019-12-08 DIAGNOSIS — M25552 Pain in left hip: Secondary | ICD-10-CM

## 2019-12-08 DIAGNOSIS — M25551 Pain in right hip: Secondary | ICD-10-CM | POA: Diagnosis not present

## 2019-12-08 DIAGNOSIS — M549 Dorsalgia, unspecified: Secondary | ICD-10-CM

## 2019-12-08 DIAGNOSIS — G894 Chronic pain syndrome: Secondary | ICD-10-CM

## 2019-12-08 DIAGNOSIS — G8929 Other chronic pain: Secondary | ICD-10-CM

## 2019-12-08 MED ORDER — HYDROCODONE-ACETAMINOPHEN 5-325 MG PO TABS: 1.0000 | ORAL_TABLET | Freq: Every day | ORAL | 0 refills | Status: DC | PRN

## 2019-12-08 MED ORDER — DULOXETINE HCL 20 MG PO CPEP: ORAL_CAPSULE | ORAL | 0 refills | Status: DC

## 2019-12-08 NOTE — Progress Notes (Signed)
Virtual Encounter - Pain Management PROVIDER NOTE: Information contained herein reflects review and annotations entered in association with encounter. Patient information is provided elsewhere in the medical record. Interpretation of information and data should be left to medically trained personnel. Document created using STT technology, any transcriptional errors that may result from process are unintentional.    Contact & Pharmacy Preferred: [phone] Home: [phone] (home) Mobile: [phone] (mobile) E-mail: [email or web address]  [id number] Lorina Rabon, [place] [address] [place] [address] Phone: [phone] Fax: [phone]   Pre-screening  [name] offered "in-person" vs "virtual" encounter. She indicated preferring virtual for this encounter.   Reason COVID-19*  Social distancing based on CDC and AMA recommendations.   I contacted [name] on 12/08/2019 via telephone (attempted video via [email or web address] but encountered connectivity issues).      I clearly identified myself as Gillis Santa, MD. I verified that I was speaking with the correct person using two identifiers (Name: [name], and date of birth: [birth year]).  Consent I sought verbal advanced consent from [name] for virtual visit interactions. I informed [name] of possible security and privacy concerns, risks, and limitations associated with providing "not-in-person" medical evaluation and management services. I also informed [name] of the availability of "in-person" appointments. Finally, I informed her that there would be a charge for the virtual visit and that she could be  personally, fully or partially, financially responsible for it. [name] expressed understanding and agreed to proceed.   Historic Elements   [name] is a 58 y.o. year old, female patient evaluated today after her last encounter by our practice on 12/08/2019. [name]  has a past medical history of Arthritis, Degenerative disorder of bone, Genuine stress incontinence, female (2012), GERD (gastroesophageal reflux disease), Migraines, and Obesity. She also  has a past surgical history that includes Laparoscopic hysterectomy (10/23/2010); Midurethral sling (05/02/2011); and excision of vulvar lesion (05/02/2011). [name] has a current medication list which includes the following prescription(s): duloxetine, gabapentin, hydrocodone-acetaminophen, melatonin, naproxen sodium, omeprazole, valacyclovir, and azithromycin. She  reports that she quit smoking about 16 years ago. Her smoking use included cigarettes. She has never used smokeless tobacco. She reports previous alcohol use. She reports that she does not use drugs. [name] has No Known Allergies.   HPI  Today, she is being contacted for medication management.   Tried Cymbalta 20 mg, had nausea for 3-4 days when she started it but afterwards nausea improved. Then developed viral stomach illness during which she stopped her Cymbalta. Pt feels that it was starting to help and would like to re-start. Also refill Hydrocodone as below.  Also s/p IA hyalgan x 2 without much benefit for b/l knee osteoarthritis/pain.    Pharmacotherapy Assessment  Analgesic:  10/13/2019  1   10/13/2019  Hydrocodone-Acetamin 5-325 MG  90.00  60 Bi Lat   [id number]   Nor (2541)   0  7.50 MME  Private Pay   [place]    Monitoring: Pharmacotherapy: No side-effects or adverse reactions reported. [place] PMP: PDMP reviewed during this encounter.       Compliance: No problems identified. Effectiveness: Clinically acceptable. Plan: Refer to "POC".  UDS:  Summary  Date Value Ref Range Status  04/10/2019 FINAL  Final    Comment:    ==================================================================== TOXASSURE COMP DRUG ANALYSIS,UR ==================================================================== Test  Result       Flag       Units Drug Present   Gabapentin                     PRESENT   Ibuprofen                      PRESENT   Naproxen                       PRESENT   Diphenhydramine                PRESENT ==================================================================== Test                      Result    Flag   Units      Ref Range   Creatinine              41               mg/dL      >=20 ==================================================================== Declared Medications:  Medication list was not provided. ==================================================================== For clinical consultation, please call [phone]. ====================================================================    Laboratory Chemistry Profile (12 mo)  Renal: 01/30/2019: BUN 8; Creatinine, Ser 0.91  Lab Results  Component Value Date   GFR 64.07 01/30/2019   GFRAA 92 06/16/2018   GFRNONAA 80 06/16/2018   Hepatic: 01/30/2019: Albumin 4.1 Lab Results  Component Value Date   AST 20 01/30/2019   ALT 26 01/30/2019   Other: 01/30/2019: Vitamin B-12 524; VITD 34.07 Note: Above Lab results reviewed.  Imaging  DG Foot Complete Right CLINICAL DATA:  Increasing pain in the right 3rd toe. No known injury.  EXAM: RIGHT FOOT COMPLETE - 3+ VIEW  COMPARISON:  None.  FINDINGS: Mild inferior calcaneal spur formation. Otherwise, normal appearing bones and soft tissues. No visible 3rd toe abnormality.  IMPRESSION: Mild inferior calcaneal spur formation. Otherwise, normal examination.  Electronically Signed   By: Claudie Revering M.D.   On: 02/27/2019 11:37   Assessment  The primary encounter diagnosis was Bilateral primary osteoarthritis of knee. Diagnoses of Chronic pain syndrome, Bilateral hip pain, Chronic SI joint pain, Lumbar spondylosis, and Musculoskeletal back pain were also pertinent to this visit.  Plan of Care   I am having [name] maintain her omeprazole,  valACYclovir, azithromycin, naproxen sodium, Melatonin, gabapentin, DULoxetine, and HYDROcodone-acetaminophen.  Pharmacotherapy (Medications Ordered): Meds ordered this encounter  Medications  . DULoxetine (CYMBALTA) 20 MG capsule    Sig: Take 1 capsule (20 mg total) by mouth daily for 30 days, THEN 2 capsules (40 mg total) daily.    Dispense:  90 capsule    Refill:  0  . HYDROcodone-acetaminophen (NORCO/VICODIN) 5-325 MG tablet    Sig: Take 1-2 tablets by mouth daily as needed for severe pain. Must last 60 days.    Dispense:  90 tablet    Refill:  0    [place] STOP ACT - Not applicable. Fill one day early if pharmacy is closed on scheduled refill date.   Follow-up plan:   Return in about 8 weeks (around 02/02/2020) for Medication Management, virtual.     s/p IA hyalgan #1 on 08/19/2019, #2 09/23/2019 not very effective unfortunately    Recent Visits Date Type Provider Dept  10/13/19 Office Visit Gillis Santa, MD [facility]  09/23/19 Procedure visit Gillis Santa, MD [facility]  Showing recent visits within past 88  days and meeting all other requirements   Today's Visits Date Type Provider Dept  12/08/19 Office Visit Gillis Santa, MD [facility]  Showing today's visits and meeting all other requirements   Future Appointments No visits were found meeting these conditions.  Showing future appointments within next 90 days and meeting all other requirements   I discussed the assessment and treatment plan with the patient. The patient was provided an opportunity to ask questions and all were answered. The patient agreed with the plan and demonstrated an understanding of the instructions.  Patient advised to call back or seek an in-person evaluation if the symptoms or condition worsens.  Total duration of non-face-to-face encounter: 25 minutes.  Note by: Gillis Santa, MD Date: 12/08/2019; Time: 3:11 PM

## 2019-12-08 NOTE — Telephone Encounter (Signed)
She returned the call at 10am. Please call her back

## 2019-12-08 NOTE — Telephone Encounter (Signed)
Called patient and got information

## 2019-12-08 NOTE — Telephone Encounter (Signed)
Pt return Sheila's call. Please give the pt a call.                                                 Thanks

## 2019-12-08 NOTE — Telephone Encounter (Signed)
Pt was called and message left on the answering service.

## 2019-12-08 NOTE — Telephone Encounter (Signed)
[name] returning call

## 2019-12-08 NOTE — Progress Notes (Signed)
Patient was started on Cymbalta last appointment. She states that it made her sick for 4 days and then she got the flu and stopped taking it. Patient has not started back on this.

## 2019-12-15 ENCOUNTER — Telehealth: Payer: Self-pay

## 2019-12-15 DIAGNOSIS — R110 Nausea: Secondary | ICD-10-CM

## 2019-12-15 NOTE — Telephone Encounter (Signed)
She said Dr. Holley Raring was going to call in some nausea medicine for her to start the cymbalta because it makes her sick. She said he didn't call it in.

## 2019-12-15 NOTE — Telephone Encounter (Signed)
I sent a bubble message to Dr. Holley Raring  about this yesterday. He has not yet responded.

## 2019-12-15 NOTE — Telephone Encounter (Signed)
Patient would like something called for nausea so that she can restart her Cymbalta.  States that she and Dr Holley Raring talked about this on last Tuesday, called in everything but the antiemetic.  I told her I would resend the message today, that he is on vacation but sometimes will check messages from home.

## 2019-12-21 MED ORDER — ONDANSETRON 8 MG PO TBDP: 8.0000 mg | ORAL_TABLET | Freq: Every day | ORAL | 2 refills | Status: AC | PRN

## 2019-12-21 NOTE — Telephone Encounter (Signed)
Voicemail left with patient that zofran Rx has been sent.

## 2019-12-31 ENCOUNTER — Other Ambulatory Visit: Payer: Self-pay | Admitting: Student in an Organized Health Care Education/Training Program

## 2019-12-31 DIAGNOSIS — G894 Chronic pain syndrome: Secondary | ICD-10-CM

## 2020-02-03 ENCOUNTER — Encounter: Payer: Self-pay | Admitting: Student in an Organized Health Care Education/Training Program

## 2020-02-04 ENCOUNTER — Encounter: Payer: Self-pay | Admitting: Student in an Organized Health Care Education/Training Program

## 2020-02-04 ENCOUNTER — Other Ambulatory Visit: Payer: Self-pay

## 2020-02-04 ENCOUNTER — Ambulatory Visit
Payer: 59 | Attending: Student in an Organized Health Care Education/Training Program | Admitting: Student in an Organized Health Care Education/Training Program

## 2020-02-04 DIAGNOSIS — M170 Bilateral primary osteoarthritis of knee: Secondary | ICD-10-CM | POA: Diagnosis not present

## 2020-02-04 DIAGNOSIS — M533 Sacrococcygeal disorders, not elsewhere classified: Secondary | ICD-10-CM | POA: Diagnosis not present

## 2020-02-04 DIAGNOSIS — G894 Chronic pain syndrome: Secondary | ICD-10-CM

## 2020-02-04 DIAGNOSIS — M47816 Spondylosis without myelopathy or radiculopathy, lumbar region: Secondary | ICD-10-CM

## 2020-02-04 DIAGNOSIS — M25551 Pain in right hip: Secondary | ICD-10-CM | POA: Diagnosis not present

## 2020-02-04 DIAGNOSIS — M549 Dorsalgia, unspecified: Secondary | ICD-10-CM

## 2020-02-04 DIAGNOSIS — M25552 Pain in left hip: Secondary | ICD-10-CM

## 2020-02-04 DIAGNOSIS — G8929 Other chronic pain: Secondary | ICD-10-CM

## 2020-02-04 MED ORDER — HYDROCODONE-ACETAMINOPHEN 5-325 MG PO TABS: 1.0000 | ORAL_TABLET | Freq: Every day | ORAL | 0 refills | Status: DC | PRN

## 2020-02-04 NOTE — Progress Notes (Signed)
Patient: [name]  Service Category: E/M  Provider: Gillis Santa, MD  DOB: 1961-11-04  DOS: 02/04/2020  Location: Office  MRN: [id number]  Setting: Ambulatory outpatient  Referring Provider: Jodelle Green, FNP  Type: Established Patient  Specialty: Interventional Pain Management  PCP: Vanessa Green, FNP  Location: Home  Delivery: TeleHealth     Virtual Encounter - Pain Management PROVIDER NOTE: Information contained herein reflects review and annotations entered in association with encounter. Interpretation of such information and data should be left to medically-trained personnel. Information provided to patient can be located elsewhere in the medical record under "Patient Instructions". Document created using STT-dictation technology, any transcriptional errors that may result from process are unintentional.    Contact & Pharmacy Preferred: [phone] Home: [phone] (home) Mobile: [phone] (mobile) E-mail: [email or web address]  [id number] [place]Phone: [phone]: [phone]  Pre-screening  [name] offered "in-person" vs "virtual" encounter. She indicated preferring virtual for this encounter.   Reason COVID-19*  Social distancing based on CDC and AMA recommendations.   I contacted [name] 02/04/2020 via telephone.      I clearly identified myself as BGillis Santa MD. I verified that I was speaking with the correct person using two identifiers (Name: [name] and date of birth: 804-03-1961.  This visit was completed via telephone due to the restrictions of the COVID-19 pandemic. All issues as above were discussed and addressed but no physical exam was performed. If it was felt that the patient should be evaluated in the office, they were directed there. The patient verbally consented to this visit. Patient was unable to complete an audio/visual visit due to  Technical difficulties and/or Lack of internet. Due to the catastrophic nature of the COVID-19 pandemic, this visit was done through audio contact only.  Location of the patient: home address (see Epic for details)  Location of the provider: office  Consent I sought verbal advanced consent from SServando Huynh virtual visit Huynh. I informed [name] of possible security and privacy concerns, risks, and limitations associated with providing "not-in-person" medical evaluation and management services. I also informed [name] of the availability of "in-person" appointments. Finally, I informed her that there would be a charge for the virtual visit and that she could be  personally, fully or partially, financially responsible for it. [name] and agreed to proceed.   Historic Elements   Ms. Vanessa DANNENBERGis a 59y.o. year old, female patient evaluated today after her last contact with our practice on 12/31/2019. [name] has a past medical history of Arthritis, Degenerative disorder of bone, Genuine stress incontinence, female (2012), GERD (gastroesophageal reflux disease), Migraines, and Obesity. She also  has a past surgical history that includes Laparoscopic hysterectomy (10/23/2010); Midurethral sling (05/02/2011); and excision of vulvar lesion (05/02/2011). [name] a current medication list which includes the following prescription(s): duloxetine, gabapentin, hydrocodone-acetaminophen, melatonin, naproxen sodium, omeprazole, ondansetron, valacyclovir, and azithromycin. She  reports that she quit smoking about 17 years ago. Her smoking use included cigarettes. She has never used smokeless tobacco. She reports previous alcohol use. She reports that she does not use drugs. [name] No Known Allergies.   HPI  Today, she is being contacted for medication management.  No change in medical history since last visit.  Patient's pain is at baseline.   Patient continues multimodal pain regimen as prescribed.  States that it provides pain relief and improvement in functional status.   Is currently on cymbalta 20 mg daily, is helping, no Huynh effects such as nausea. Would like to increase dose. Discussed increasing to 40 mg daily. Has refills on her current Rx so will start taking 40 mg daily.  Will refill Hydrocodone as below.  Pharmacotherapy Assessment  Analgesic: 12/09/2019  1   12/08/2019  Hydrocodone-Acetamin 5-325 MG  90.00  60 Bi Lat   [id number]   Nor ([id number])   0  7.50 MME  Private Pay   [name]   Monitoring: [place] PMP: PDMP reviewed during this encounter.       Pharmacotherapy: No Huynh-effects or adverse reactions reported. Compliance: No problems identified. Effectiveness: Clinically acceptable. Plan: Refer to "POC".   UDS:  Summary  Date Value Ref Range Status  04/10/2019 FINAL  Final    Comment:    ==================================================================== TOXASSURE COMP DRUG ANALYSIS,UR ==================================================================== Test                             Result       Flag       Units Drug Present   Gabapentin                     PRESENT   Ibuprofen                      PRESENT   Naproxen                       PRESENT   Diphenhydramine                PRESENT ==================================================================== Test                      Result    Flag   Units      Ref Range   Creatinine              41               mg/dL      >=20 ==================================================================== Declared Medications:  Medication list was not provided. ==================================================================== For clinical consultation, please call [phone]. ====================================================================    Laboratory Chemistry Profile   Renal Lab Results  Component Value Date   BUN 8 01/30/2019   CREATININE 0.91  01/30/2019   BCR 12 06/16/2018   GFR 64.07 01/30/2019   GFRAA 92 06/16/2018   GFRNONAA 80 06/16/2018    Hepatic Lab Results  Component Value Date   AST 20 01/30/2019   ALT 26 01/30/2019   ALBUMIN 4.1 01/30/2019   ALKPHOS 91 01/30/2019    Electrolytes Lab Results  Component Value Date   NA 141 01/30/2019   K 4.2 01/30/2019   CL 106 01/30/2019   CALCIUM 10.0 01/30/2019   MG 1.8 01/30/2019    Bone Lab Results  Component Value Date   VD25OH 34.07 01/30/2019    Inflammation (CRP: Acute Phase) (ESR: Chronic Phase) No results found for: CRP, ESRSEDRATE, LATICACIDVEN    Note: Above Lab results reviewed.  Imaging  DG Foot Complete Right CLINICAL DATA:  Increasing pain in the right 3rd toe. No known injury.  EXAM: RIGHT FOOT COMPLETE - 3+ VIEW  COMPARISON:  None.  FINDINGS: Mild inferior calcaneal spur formation. Otherwise, normal appearing bones and soft tissues. No visible 3rd toe abnormality.  IMPRESSION: Mild  inferior calcaneal spur formation. Otherwise, normal examination.  Electronically Signed   By: Claudie Revering M.D.   On: 02/27/2019 11:37  Assessment  The primary encounter diagnosis was Bilateral primary osteoarthritis of knee. Diagnoses of Chronic pain syndrome, Bilateral hip pain, Chronic SI joint pain, Lumbar spondylosis, Lumbar facet arthropathy, and Musculoskeletal back pain were also pertinent to this visit.  Plan of Care   [name] has a current medication list which includes the following long-term medication(s): duloxetine, gabapentin, and omeprazole.   1.  Increase Cymbalta to 40 mg daily.  New prescription not needed as patient has 2 refills on current prescription of 20 mg capsule which she will increase to 40 mg daily. 2.  Refill hydrocodone as below. 3.  Continue gabapentin and melatonin as prescribed.  Continue Aleve as needed for chronic knee arthralgias.    Pharmacotherapy (Medications Ordered): Meds ordered this encounter   Medications  . HYDROcodone-acetaminophen (NORCO/VICODIN) 5-325 MG tablet    Sig: Take 1-2 tablets by mouth daily as needed for severe pain. Must last 60 days.    Dispense:  90 tablet    Refill:  0    [place] STOP ACT - Not applicable. Fill one day early if pharmacy is closed on scheduled refill date.   Follow-up plan:   Return in about 8 weeks (around 03/31/2020) for Medication Management, in person.     s/p IA hyalgan #1 on 08/19/2019, #2 09/23/2019 not very effective unfortunately     Recent Visits Date Type Provider Dept  12/08/19 Office Visit Vanessa Santa, MD [facility]  Showing recent visits within past 90 days and meeting all other requirements   Today's Visits Date Type Provider Dept  02/04/20 Office Visit Vanessa Santa, MD [facility]  Showing today's visits and meeting all other requirements   Future Appointments No visits were found meeting these conditions.  Showing future appointments within next 90 days and meeting all other requirements   I discussed the assessment and treatment plan with the patient. The patient was provided an opportunity to ask questions and all were answered. The patient agreed with the plan and demonstrated an Huynh of the instructions.  Patient advised to call back or seek an in-person evaluation if the symptoms or condition worsens.  Duration of encounter: 25 minutes.  Note by: Vanessa Santa, MD Date: 02/04/2020; Time: 1:34 PM

## 2020-02-08 ENCOUNTER — Telehealth: Payer: Self-pay | Admitting: Family Medicine

## 2020-02-08 DIAGNOSIS — K219 Gastro-esophageal reflux disease without esophagitis: Secondary | ICD-10-CM

## 2020-02-08 MED ORDER — OMEPRAZOLE 20 MG PO CPDR: 20.0000 mg | DELAYED_RELEASE_CAPSULE | Freq: Every day | ORAL | 0 refills | Status: DC

## 2020-02-08 NOTE — Telephone Encounter (Signed)
Medicatyion has been refilled for only 30 days to get patient to establish with new TOC.

## 2020-02-08 NOTE — Telephone Encounter (Signed)
Pt needs a refill on omeprazole (PRILOSEC) 20 MG capsule. Pt has an appt on 3/12 with Dawson Bills for TOC. Needs enough until appt.

## 2020-02-24 ENCOUNTER — Other Ambulatory Visit: Payer: Self-pay | Admitting: Internal Medicine

## 2020-02-24 DIAGNOSIS — K219 Gastro-esophageal reflux disease without esophagitis: Secondary | ICD-10-CM

## 2020-02-26 ENCOUNTER — Encounter: Payer: 59 | Admitting: Nurse Practitioner

## 2020-03-22 ENCOUNTER — Encounter: Payer: Self-pay | Admitting: Student in an Organized Health Care Education/Training Program

## 2020-03-23 ENCOUNTER — Encounter: Payer: Self-pay | Admitting: Student in an Organized Health Care Education/Training Program

## 2020-03-23 ENCOUNTER — Ambulatory Visit
Payer: 59 | Attending: Student in an Organized Health Care Education/Training Program | Admitting: Student in an Organized Health Care Education/Training Program

## 2020-03-23 ENCOUNTER — Other Ambulatory Visit: Payer: Self-pay

## 2020-03-23 DIAGNOSIS — M533 Sacrococcygeal disorders, not elsewhere classified: Secondary | ICD-10-CM

## 2020-03-23 DIAGNOSIS — M25552 Pain in left hip: Secondary | ICD-10-CM

## 2020-03-23 DIAGNOSIS — M47816 Spondylosis without myelopathy or radiculopathy, lumbar region: Secondary | ICD-10-CM

## 2020-03-23 DIAGNOSIS — M5416 Radiculopathy, lumbar region: Secondary | ICD-10-CM | POA: Insufficient documentation

## 2020-03-23 DIAGNOSIS — M170 Bilateral primary osteoarthritis of knee: Secondary | ICD-10-CM | POA: Diagnosis not present

## 2020-03-23 DIAGNOSIS — G8929 Other chronic pain: Secondary | ICD-10-CM

## 2020-03-23 DIAGNOSIS — M25551 Pain in right hip: Secondary | ICD-10-CM | POA: Diagnosis not present

## 2020-03-23 DIAGNOSIS — G894 Chronic pain syndrome: Secondary | ICD-10-CM

## 2020-03-23 HISTORY — DX: Radiculopathy, lumbar region: M54.16

## 2020-03-23 MED ORDER — DULOXETINE HCL 20 MG PO CPEP: 40.0000 mg | ORAL_CAPSULE | Freq: Every day | ORAL | 3 refills | Status: DC

## 2020-03-23 MED ORDER — GABAPENTIN 300 MG PO CAPS: 300.0000 mg | ORAL_CAPSULE | Freq: Two times a day (BID) | ORAL | 3 refills | Status: DC

## 2020-03-23 MED ORDER — HYDROCODONE-ACETAMINOPHEN 5-325 MG PO TABS: 1.0000 | ORAL_TABLET | Freq: Every day | ORAL | 0 refills | Status: DC | PRN

## 2020-03-23 NOTE — Progress Notes (Addendum)
Patient: [name]  Service Category: E/M  Provider: Gillis Santa, MD  DOB: Jun 14, 1961  DOS: 03/23/2020  Location: Office  MRN: [id number]  Setting: Ambulatory outpatient  Referring Provider: Jodelle Green, FNP  Type: Established Patient  Specialty: Interventional Pain Management  PCP: Jodelle Green, FNP  Location: Work  Delivery: TeleHealth     Virtual Encounter - Pain Management PROVIDER NOTE: Information contained herein reflects review and annotations entered in association with encounter. Interpretation of such information and data should be left to medically-trained personnel. Information provided to patient can be located elsewhere in the medical record under "Patient Instructions". Document created using STT-dictation technology, any transcriptional errors that may result from process are unintentional.    Contact & Pharmacy Preferred: [phone] Home: [phone] (home) Mobile: [phone] (mobile) E-mail: [email or web address]  [id number] [address]Phone: [phone]: [phone]  Pre-screening  [name] offered "in-person" vs "virtual" encounter. She indicated preferring virtual for this encounter.   Reason COVID-19*  Social distancing based on CDC and AMA recommendations.   I contacted [name] 03/23/2020 via video conference.      I clearly identified myself as BGillis Santa MD. I verified that I was speaking with the correct person using two identifiers (Name: SSHAWNAE Huynh and date of birth: 812-20-1962.  Consent I sought verbal advanced consent from SServando Snarefor virtual visit interactions. I informed [name] of possible security and privacy concerns, risks, and limitations associated with providing "not-in-person" medical evaluation and management services. I also informed [name] of the availability of "in-person" appointments. Finally, I informed her  that there would be a charge for the virtual visit and that she could be  personally, fully or partially, financially responsible for it. Ms. SSearingexpressed understanding and agreed to proceed.   Historic Elements   Ms. Vanessa WASSMERis a 59y.o. year old, female patient evaluated today after her last contact with our practice on 02/04/2020. Ms. SFeher has a past medical history of Arthritis, Degenerative disorder of bone, Genuine stress incontinence, female (2012), GERD (gastroesophageal reflux disease), Migraines, and Obesity. She also  has a past surgical history that includes Laparoscopic hysterectomy (10/23/2010); Midurethral sling (05/02/2011); and excision of vulvar lesion (05/02/2011). Ms. SSanjosehas a current medication list which includes the following prescription(s): azithromycin, gabapentin, hydrocodone-acetaminophen, melatonin, naproxen sodium, omeprazole, ondansetron, valacyclovir, and duloxetine. She  reports that she quit smoking about 17 years ago. Her smoking use included cigarettes. She has never used smokeless tobacco. She reports previous alcohol use. She reports that she does not use drugs. Ms. SBeanehas No Known Allergies.   HPI  Today, she is being contacted for medication management.  SAnyoverall is doing okay.  No significant changes in her medical history.  She continues to endorse right low back pain and right buttock pain as well as right hip pain.  She denies groin pain.  Her pain does radiate in a dermatomal fashion. She continues her multimodal analgesics as prescribed including gabapentin, Cymbalta, Norco as needed.  She states that the Cymbalta is providing her with relief and that she is utilizing less Norco than before.  Pharmacotherapy Assessment  Analgesic: 02/05/2020  2   02/04/2020  Hydrocodone-Acetamin 5-325 MG  90.00  60 Bi Lat   [id number]  Nor ([id number])   0  7.50 MME  Private Pay   [place]    Monitoring: [place] PMP:  PDMP reviewed during this encounter.        Pharmacotherapy: No side-effects or adverse reactions reported. Compliance: No problems identified. Effectiveness: Clinically acceptable. Plan: Refer to "POC".  UDS:  Summary  Date Value Ref Range Status  04/10/2019 FINAL  Final    Comment:    ==================================================================== TOXASSURE COMP DRUG ANALYSIS,UR ==================================================================== Test                             Result       Flag       Units Drug Present   Gabapentin                     PRESENT   Ibuprofen                      PRESENT   Naproxen                       PRESENT   Diphenhydramine                PRESENT ==================================================================== Test                      Result    Flag   Units      Ref Range   Creatinine              41               mg/dL      >=20 ==================================================================== Declared Medications:  Medication list was not provided. ==================================================================== For clinical consultation, please call [phone]. ====================================================================    Laboratory Chemistry Profile   Renal Lab Results  Component Value Date   BUN 8 01/30/2019   CREATININE 0.91 01/30/2019   BCR 12 06/16/2018   GFR 64.07 01/30/2019   GFRAA 92 06/16/2018   GFRNONAA 80 06/16/2018     Hepatic Lab Results  Component Value Date   AST 20 01/30/2019   ALT 26 01/30/2019   ALBUMIN 4.1 01/30/2019   ALKPHOS 91 01/30/2019     Electrolytes Lab Results  Component Value Date   NA 141 01/30/2019   K 4.2 01/30/2019   CL 106 01/30/2019   CALCIUM 10.0 01/30/2019   MG 1.8 01/30/2019     Bone Lab Results  Component Value Date   VD25OH 34.07 01/30/2019     Inflammation (CRP: Acute Phase) (ESR: Chronic Phase) No results found for: CRP, ESRSEDRATE, LATICACIDVEN     Note: Above Lab results  reviewed.  Imaging  DG Foot Complete Right CLINICAL DATA:  Increasing pain in the right 3rd toe. No known injury.  EXAM: RIGHT FOOT COMPLETE - 3+ VIEW  COMPARISON:  None.  FINDINGS: Mild inferior calcaneal spur formation. Otherwise, normal appearing bones and soft tissues. No visible 3rd toe abnormality.  IMPRESSION: Mild inferior calcaneal spur formation. Otherwise, normal examination.  Electronically Signed   By: Claudie Revering M.D.   On: 02/27/2019 11:37  Assessment  The primary encounter diagnosis was Lumbar radiculopathy. Diagnoses of Bilateral primary osteoarthritis of knee, Bilateral hip pain, Chronic SI joint pain, Lumbar spondylosis, and Chronic pain syndrome were also pertinent to this visit.  Plan of Care   [name] has a current medication list which includes the following long-term medication(s): gabapentin, omeprazole, and duloxetine.  1.  Lumbar radicular pain, lumbar radiculopathy: Recommend MRI of lumbar spine without contrast.  Can discuss interventional treatment plans pending results 2.  Refill Cymbalta, gabapentin, hydrocodone as below 3.  Follow-up in 2 months face-to-face.  Pharmacotherapy (Medications Ordered): Meds ordered this encounter  Medications  . DULoxetine (CYMBALTA) 20 MG capsule    Sig: Take 2 capsules (40 mg total) by mouth daily.    Dispense:  60 capsule    Refill:  3  . gabapentin (NEURONTIN) 300 MG capsule    Sig: Take 1 capsule (300 mg total) by mouth 2 (two) times daily.    Dispense:  60 capsule    Refill:  3  . HYDROcodone-acetaminophen (NORCO/VICODIN) 5-325 MG tablet    Sig: Take 1-2 tablets by mouth daily as needed for severe pain. Must last 60 days.    Dispense:  90 tablet    Refill:  0    [place] STOP ACT - Not applicable. Fill one day early if pharmacy is closed on scheduled refill date.   Orders:  Orders Placed This Encounter  Procedures  . MR LUMBAR SPINE WO CONTRAST    Patient presents with axial pain with  possible radicular component.  In addition to any acute findings, please report on:  1. Facet (Zygapophyseal) joint DJD (Hypertrophy, space narrowing, subchondral sclerosis, and/or osteophyte formation) 2. DDD and/or IVDD (Loss of disc height, desiccation or "Black disc disease") 3. Pars defects 4. Spondylolisthesis, spondylosis, and/or spondyloarthropathies (include Degree/Grade of displacement in mm) 5. Vertebral body Fractures, including age (old, new/acute) 24. Modic Type Changes 7. Demineralization 8. Bone pathology 9. Central, Lateral Recess, and/or Foraminal Stenosis (include AP diameter of stenosis in mm) 10. Surgical changes (hardware type, status, and presence of fibrosis)  NOTE: Please specify level(s) and laterality.    Standing Status:   Future    Standing Expiration Date:   06/22/2020    Order Specific Question:   What is the patient's sedation requirement?    Answer:   No Sedation    Order Specific Question:   Does the patient have a pacemaker or implanted devices?    Answer:   No    Order Specific Question:   Preferred imaging location?    Answer:   [facility] (table limit-350lbs)    Order Specific Question:   Call Results- Best Contact Number?    Answer:   [phone] ([facility])    Order Specific Question:   Radiology Contrast Protocol - do NOT remove file path    Answer:   [email or web address]   Follow-up plan:   Return in about 8 weeks (around 05/18/2020) for Medication Management, in person, (UDS).     s/p IA hyalgan #1 on 08/19/2019, #2 09/23/2019 not very effective unfortunately      Recent Visits Date Type Provider Dept  02/04/20 Office Visit Gillis Santa, MD [facility]  Showing recent visits within past 90 days and meeting all other requirements   Today's Visits Date Type Provider Dept  03/23/20 Office Visit Gillis Santa, MD [facility]  Showing today's visits and meeting all other requirements    Future Appointments No visits were found meeting these conditions.  Showing future appointments within next 90 days and meeting all other requirements   I discussed the assessment and treatment plan with the patient. The patient was provided an opportunity to ask questions and all were answered. The patient agreed with the plan and demonstrated an understanding of the instructions.  Patient advised to call back or seek an in-person evaluation if the symptoms or condition worsens.  Duration of  encounter:25 minutes.  Note by: Gillis Santa, MD Date: 03/23/2020; Time: 2:58 PM

## 2020-03-29 ENCOUNTER — Telehealth: Payer: Self-pay | Admitting: Student in an Organized Health Care Education/Training Program

## 2020-03-29 NOTE — Telephone Encounter (Signed)
May I call pharmacy and authorize 3 month supply? She did not mention Cymbalta. I will ask her about it, may I do that for 3 months as well?

## 2020-03-29 NOTE — Telephone Encounter (Signed)
Patient notified per voicemail that 3-month supply [facility] and Cymbalta.

## 2020-03-29 NOTE — Telephone Encounter (Signed)
Patient would like to have gabapentin as 3 month supply. [facility] and said they do no have authorization to fill as a 3 month supply, can this be changed? Please let patient and pharmacy know. Thank you

## 2020-04-14 ENCOUNTER — Telehealth: Payer: Self-pay

## 2020-04-14 DIAGNOSIS — M47816 Spondylosis without myelopathy or radiculopathy, lumbar region: Secondary | ICD-10-CM

## 2020-04-14 NOTE — Telephone Encounter (Signed)
She said she got a letter from her insurance denied MRI. She wants to know if there is anything you can do. IF not can you put in xray orders.

## 2020-04-19 ENCOUNTER — Telehealth: Payer: Self-pay

## 2020-04-20 NOTE — Telephone Encounter (Signed)
Patient aware next visit is in person

## 2020-05-18 ENCOUNTER — Other Ambulatory Visit: Payer: Self-pay

## 2020-05-18 ENCOUNTER — Ambulatory Visit (HOSPITAL_BASED_OUTPATIENT_CLINIC_OR_DEPARTMENT_OTHER): Payer: 59 | Admitting: Student in an Organized Health Care Education/Training Program

## 2020-05-18 ENCOUNTER — Ambulatory Visit
Admission: RE | Admit: 2020-05-18 | Discharge: 2020-05-18 | Disposition: A | Discharge status: Home / Self Care | Payer: 59 | Source: Ambulatory Visit | Attending: Student in an Organized Health Care Education/Training Program | Admitting: Student in an Organized Health Care Education/Training Program

## 2020-05-18 ENCOUNTER — Encounter: Payer: Self-pay | Admitting: Student in an Organized Health Care Education/Training Program

## 2020-05-18 VITALS — BP 154/99 | HR 78 | Temp 97.3°F | Resp 18 | Ht 68.0 in | Wt 210.0 lb

## 2020-05-18 DIAGNOSIS — G894 Chronic pain syndrome: Secondary | ICD-10-CM

## 2020-05-18 DIAGNOSIS — M533 Sacrococcygeal disorders, not elsewhere classified: Secondary | ICD-10-CM | POA: Insufficient documentation

## 2020-05-18 DIAGNOSIS — M549 Dorsalgia, unspecified: Secondary | ICD-10-CM | POA: Insufficient documentation

## 2020-05-18 DIAGNOSIS — M47816 Spondylosis without myelopathy or radiculopathy, lumbar region: Secondary | ICD-10-CM

## 2020-05-18 DIAGNOSIS — M170 Bilateral primary osteoarthritis of knee: Secondary | ICD-10-CM

## 2020-05-18 DIAGNOSIS — M25552 Pain in left hip: Secondary | ICD-10-CM | POA: Diagnosis present

## 2020-05-18 DIAGNOSIS — M5416 Radiculopathy, lumbar region: Secondary | ICD-10-CM | POA: Diagnosis not present

## 2020-05-18 DIAGNOSIS — G8929 Other chronic pain: Secondary | ICD-10-CM | POA: Insufficient documentation

## 2020-05-18 DIAGNOSIS — M25551 Pain in right hip: Secondary | ICD-10-CM | POA: Diagnosis present

## 2020-05-18 DIAGNOSIS — M51369 Other intervertebral disc degeneration, lumbar region without mention of lumbar back pain or lower extremity pain: Secondary | ICD-10-CM

## 2020-05-18 DIAGNOSIS — M5136 Other intervertebral disc degeneration, lumbar region: Secondary | ICD-10-CM | POA: Insufficient documentation

## 2020-05-18 MED ORDER — HYDROCODONE-ACETAMINOPHEN 5-325 MG PO TABS: 1.0000 | ORAL_TABLET | Freq: Every day | ORAL | 0 refills | Status: DC | PRN

## 2020-05-18 NOTE — Patient Instructions (Signed)
____________________________________________________________________________________________  Preparing for Procedure with Sedation  Procedure appointments are limited to planned procedures: . No Prescription Refills. . No disability issues will be discussed. . No medication changes will be discussed.  Instructions: . Oral Intake: Do not eat or drink anything for at least 8 hours prior to your procedure. (Exception: Blood Pressure Medication. See below.) . Transportation: Unless otherwise stated by your physician, you may drive yourself after the procedure. . Blood Pressure Medicine: Do not forget to take your blood pressure medicine with a sip of water the morning of the procedure. If your Diastolic (lower reading)is above 100 mmHg, elective cases will be cancelled/rescheduled. . Blood thinners: These will need to be stopped for procedures. Notify our staff if you are taking any blood thinners. Depending on which one you take, there will be specific instructions on how and when to stop it. . Diabetics on insulin: Notify the staff so that you can be scheduled 1st case in the morning. If your diabetes requires high dose insulin, take only  of your normal insulin dose the morning of the procedure and notify the staff that you have done so. . Preventing infections: Shower with an antibacterial soap the morning of your procedure. . Build-up your immune system: Take 1000 mg of Vitamin C with every meal (3 times a day) the day prior to your procedure. . Antibiotics: Inform the staff if you have a condition or reason that requires you to take antibiotics before dental procedures. . Pregnancy: If you are pregnant, call and cancel the procedure. . Sickness: If you have a cold, fever, or any active infections, call and cancel the procedure. . Arrival: You must be in the facility at least 30 minutes prior to your scheduled procedure. . Children: Do not bring children with you. . Dress appropriately:  Bring dark clothing that you would not mind if they get stained. . Valuables: Do not bring any jewelry or valuables.  Reasons to call and reschedule or cancel your procedure: (Following these recommendations will minimize the risk of a serious complication.) . Surgeries: Avoid having procedures within 2 weeks of any surgery. (Avoid for 2 weeks before or after any surgery). . Flu Shots: Avoid having procedures within 2 weeks of a flu shots or . (Avoid for 2 weeks before or after immunizations). . Barium: Avoid having a procedure within 7-10 days after having had a radiological study involving the use of radiological contrast. (Myelograms, Barium swallow or enema study). . Heart attacks: Avoid any elective procedures or surgeries for the initial 6 months after a "Myocardial Infarction" (Heart Attack). . Blood thinners: It is imperative that you stop these medications before procedures. Let us know if you if you take any blood thinner.  . Infection: Avoid procedures during or within two weeks of an infection (including chest colds or gastrointestinal problems). Symptoms associated with infections include: Localized redness, fever, chills, night sweats or profuse sweating, burning sensation when voiding, cough, congestion, stuffiness, runny nose, sore throat, diarrhea, nausea, vomiting, cold or Flu symptoms, recent or current infections. It is specially important if the infection is over the area that we intend to treat. . Heart and lung problems: Symptoms that may suggest an active cardiopulmonary problem include: cough, chest pain, breathing difficulties or shortness of breath, dizziness, ankle swelling, uncontrolled high or unusually low blood pressure, and/or palpitations. If you are experiencing any of these symptoms, cancel your procedure and contact your primary care physician for an evaluation.  Remember:  Regular Business hours are:    Monday to Thursday 8:00 AM to 4:00 PM  Provider's  Schedule: Milinda Pointer, MD:  Procedure days: Tuesday and Thursday 7:30 AM to 4:00 PM  Gillis Santa, MD:  Procedure days: Monday and Wednesday 7:30 AM to 4:00 PM ____________________________________________________________________________________________  ____________________________________________________________________________________________  Preparing for your procedure (without sedation)  Procedure appointments are limited to planned procedures: . No Prescription Refills. . No disability issues will be discussed. . No medication changes will be discussed.  Instructions: . Oral Intake: Do not eat or drink anything for at least 6 hours prior to your procedure. (Exception: Blood Pressure Medication. See below.) . Transportation: Unless otherwise stated by your physician, you may drive yourself after the procedure. . Blood Pressure Medicine: Do not forget to take your blood pressure medicine with a sip of water the morning of the procedure. If your Diastolic (lower reading)is above 100 mmHg, elective cases will be cancelled/rescheduled. . Blood thinners: These will need to be stopped for procedures. Notify our staff if you are taking any blood thinners. Depending on which one you take, there will be specific instructions on how and when to stop it. . Diabetics on insulin: Notify the staff so that you can be scheduled 1st case in the morning. If your diabetes requires high dose insulin, take only  of your normal insulin dose the morning of the procedure and notify the staff that you have done so. . Preventing infections: Shower with an antibacterial soap the morning of your procedure.  . Build-up your immune system: Take 1000 mg of Vitamin C with every meal (3 times a day) the day prior to your procedure. Marland Kitchen Antibiotics: Inform the staff if you have a condition or reason that requires you to take antibiotics before dental procedures. . Pregnancy: If you are pregnant, call and cancel  the procedure. . Sickness: If you have a cold, fever, or any active infections, call and cancel the procedure. . Arrival: You must be in the facility at least 30 minutes prior to your scheduled procedure. . Children: Do not bring any children with you. . Dress appropriately: Bring dark clothing that you would not mind if they get stained. . Valuables: Do not bring any jewelry or valuables.  Reasons to call and reschedule or cancel your procedure: (Following these recommendations will minimize the risk of a serious complication.) . Surgeries: Avoid having procedures within 2 weeks of any surgery. (Avoid for 2 weeks before or after any surgery). . Flu Shots: Avoid having procedures within 2 weeks of a flu shots or . (Avoid for 2 weeks before or after immunizations). . Barium: Avoid having a procedure within 7-10 days after having had a radiological study involving the use of radiological contrast. (Myelograms, Barium swallow or enema study). . Heart attacks: Avoid any elective procedures or surgeries for the initial 6 months after a "Myocardial Infarction" (Heart Attack). . Blood thinners: It is imperative that you stop these medications before procedures. Let us know if you if you take any blood thinner.  . Infection: Avoid procedures during or within two weeks of an infection (including chest colds or gastrointestinal problems). Symptoms associated with infections include: Localized redness, fever, chills, night sweats or profuse sweating, burning sensation when voiding, cough, congestion, stuffiness, runny nose, sore throat, diarrhea, nausea, vomiting, cold or Flu symptoms, recent or current infections. It is specially important if the infection is over the area that we intend to treat. Marland Kitchen Heart and lung problems: Symptoms that may suggest an active cardiopulmonary problem include: cough, chest  pain, breathing difficulties or shortness of breath, dizziness, ankle swelling, uncontrolled high or  unusually low blood pressure, and/or palpitations. If you are experiencing any of these symptoms, cancel your procedure and contact your primary care physician for an evaluation.  Remember:  Regular Business hours are:  Monday to Thursday 8:00 AM to 4:00 PM  Provider's Schedule: Milinda Pointer, MD:  Procedure days: Tuesday and Thursday 7:30 AM to 4:00 PM  Gillis Santa, MD:  Procedure days: Monday and Wednesday 7:30 AM to 4:00 PM ____________________________________________________________________________________________   Facet Joint Block The facet joints connect the bones of the spine (vertebrae). They make it possible for you to bend, twist, and make other movements with your spine. They also keep you from bending too far, twisting too far, and making other extreme movements. A facet joint block is a procedure in which a numbing medicine (anesthetic) is injected into a facet joint. In many cases, an anti-inflammatory medicine (steroid) is also injected. A facet joint block may be done:  To diagnose neck or back pain. If the pain gets better after a facet joint block, it means the pain is probably coming from the facet joint. If the pain does not get better, it means the pain is probably not coming from the facet joint.  To relieve neck or back pain that is caused by an inflamed facet joint. A facet joint block is only done to relieve pain if the pain does not improve with other methods, such as medicine, exercise programs, and physical therapy. Tell a health care provider about:  Any allergies you have.  All medicines you are taking, including vitamins, herbs, eye drops, creams, and over-the-counter medicines.  Any problems you or family members have had with anesthetic medicines.  Any blood disorders you have.  Any surgeries you have had.  Any medical conditions you have or have had.  Whether you are pregnant or may be pregnant. What are the risks? Generally, this is a  safe procedure. However, problems may occur, including:  Bleeding.  Injury to a nerve near the injection site.  Pain at the injection site.  Weakness or numbness in areas controlled by nerves near the injection site.  Infection.  Temporary fluid retention.  Allergic reactions to medicines or dyes.  Injury to other structures or organs near the injection site. What happens before the procedure? Medicines Ask your health care provider about:  Changing or stopping your regular medicines. This is especially important if you are taking diabetes medicines or blood thinners.  Taking medicines such as aspirin and ibuprofen. These medicines can thin your blood. Do not take these medicines unless your health care provider tells you to take them.  Taking over-the-counter medicines, vitamins, herbs, and supplements. Eating and drinking Follow instructions from your health care provider about eating and drinking, which may include:  8 hours before the procedure - stop eating heavy meals or foods, such as meat, fried foods, or fatty foods.  6 hours before the procedure - stop eating light meals or foods, such as toast or cereal.  6 hours before the procedure - stop drinking milk or drinks that contain milk.  2 hours before the procedure - stop drinking clear liquids. Staying hydrated Follow instructions from your health care provider about hydration, which may include:  Up to 2 hours before the procedure - you may continue to drink clear liquids, such as water, clear fruit juice, black coffee, and plain tea. General instructions  Do not use any products that  contain nicotine or tobacco for at least 4-6 weeks before the procedure. These products include cigarettes, e-cigarettes, and chewing tobacco. If you need help quitting, ask your health care provider.  Plan to have someone take you home from the hospital or clinic.  Ask your health care provider: ? How your surgery site will be  marked. ? What steps will be taken to help prevent infection. These may include:  Removing hair at the surgery site.  Washing skin with a germ-killing soap.  Receiving antibiotic medicine. What happens during the procedure?   You will put on a hospital gown.  You will lie on your stomach on an X-ray table. You may be asked to lie in a different position if an injection will be made in your neck.  Machines will be used to monitor your oxygen levels, heart rate, and blood pressure.  Your skin will be cleaned.  If an injection will be made in your neck, an IV will be inserted into one of your veins. Fluids and medicine will flow directly into your body through the IV.  A numbing medicine (local anesthetic) will be applied to your skin. Your skin may sting or burn for a moment.  A video X-ray machine (fluoroscopy) will be used to find the joint. In some cases, a CT scan may be used.  A contrast dye may be injected into the facet joint area to help find the joint.  When the joint is located, an anesthetic will be injected into the joint through the needle.  Your health care provider will ask you whether you feel pain relief. ? If you feel relief, a steroid may be injected to provide pain relief for a longer period of time. ? If you do not feel relief or feel only partial relief, additional injections of an anesthetic may be made in other facet joints.  The needle will be removed.  Your skin will be cleaned.  A bandage (dressing) will be applied over each injection site. The procedure may vary among health care providers and hospitals. What happens after the procedure?  Your blood pressure, heart rate, breathing rate, and blood oxygen level will be monitored until you leave the hospital or clinic.  You will lie down and rest for a period of time. Summary  A facet joint block is a procedure in which a numbing medicine (anesthetic) is injected into a facet joint. An  anti-inflammatory medicine (stereoid) may also be injected.  Follow instructions from your health care provider about medicines and eating and drinking before the procedure.  Do not use any products that contain nicotine or tobacco for at least 4-6 weeks before the procedure.  You will lie on your stomach for the procedure, but you may be asked to lie in a different position if an injection will be made in your neck.  When the joint is located, an anesthetic will be injected into the joint through the needle. This information is not intended to replace advice given to you by your health care provider. Make sure you discuss any questions you have with your health care provider. Document Revised: 03/26/2019 Document Reviewed: 11/07/2018 [employer] Patient Education  [place].

## 2020-05-18 NOTE — Progress Notes (Signed)
Nursing Pain Medication Assessment:  Safety precautions to be maintained throughout the outpatient stay will include: orient to surroundings, keep bed in low position, maintain call bell within reach at all times, provide assistance with transfer out of bed and ambulation.  Medication Inspection Compliance: Pill count conducted under aseptic conditions, in front of the patient. Neither the pills nor the bottle was removed from the patient's sight at any time. Once count was completed pills were immediately returned to the patient in their original bottle.  Medication: Hydrocodone/APAP Pill/Patch Count: 18 of 90 pills remain Pill/Patch Appearance: Markings consistent with prescribed medication Bottle Appearance: Standard pharmacy container. Clearly labeled. Filled Date: 4 / 20 / 2021 Last Medication intake:  Today

## 2020-05-18 NOTE — Progress Notes (Signed)
Safety precautions to be maintained throughout the outpatient stay will include: orient to surroundings, keep bed in low position, maintain call bell within reach at all times, provide assistance with transfer out of bed and ambulation.  

## 2020-05-18 NOTE — Progress Notes (Addendum)
PROVIDER NOTE: Information contained herein reflects review and annotations entered in association with encounter. Interpretation of such information and data should be left to medically-trained personnel. Information provided to patient can be located elsewhere in the medical record under "Patient Instructions". Document created using STT-dictation technology, any transcriptional errors that may result from process are unintentional.    Patient: [name]  Service Category: E/M  Provider: Gillis Santa, MD  DOB: Jan 29, 1961  DOS: 05/18/2020  Referring Provider: Jodelle Green, FNP  MRN: [id number]  Setting: Ambulatory outpatient  PCP: Jodelle Green, FNP  Type: Established Patient  Specialty: Interventional Pain Management    Location: Office  Delivery: Face-to-face     Primary Reason(s) for Visit: Encounter for prescription drug management. (Level of risk: moderate)  CC: Back Pain (lower) and Pain  HPI  [name] is a 59 y.o. year old, female patient, who comes today for a medication management evaluation. She has Obesity; GERD (gastroesophageal reflux disease); Arthritis; Squamous cell carcinoma of nasolabial fold; Insomnia; Menopause; Chronic pain syndrome; Family history of diabetes mellitus; Family history of heart disease; Lumbar spondylosis; Bilateral primary osteoarthritis of knee; Bilateral hip pain; Chronic SI joint pain; and Lumbar radiculopathy on their problem list. Her primarily concern today is the Back Pain (lower) and Pain  Pain Assessment: Location: Lower Back Radiating: buttocks bilateral radiates to sides. Left side worse Onset: More than a month ago Duration: Chronic pain Quality: Aching, Constant, Tiring Severity: 0-No pain(depends on what she is doing. Saturday 8-9/10 "Just a flare up")/10 (subjective, self-reported pain score)  Note: Reported level is compatible with observation.                         When using our objective Pain Scale, levels between 6 and 10/10  are said to belong in an emergency room, as it progressively worsens from a 6/10, described as severely limiting, requiring emergency care not usually available at an outpatient pain management facility. At a 6/10 level, communication becomes difficult and requires great effort. Assistance to reach the emergency department may be required. Facial flushing and profuse sweating along with potentially dangerous increases in heart rate and blood pressure will be evident. Effect on ADL: "I cant do anything when it is flared up" Timing: Constant Modifying factors: relax, rest, heat BP: (!) 154/99  HR: 78  [name] was last scheduled for an appointment on 03/29/2020 for medication management. During today's appointment we reviewed [name]'s chronic pain status, as well as her outpatient medication regimen.  Increased low back pain over this past weekend, no inciting event or fall last week. States that she laid in bed all day. Lays on heating pad and also finds benefit with Epson salt bath. Pain much better on Sunday. Has root canal today Utilizing right knee brace that was recommended at last visit, states that it helps with support and alignment Plans to have her lumbar spine x-ray performed after today's visit. Patient is requesting MRI that was previously denied.  The patient  reports no history of drug use. Her body mass index is 31.93 kg/m.  Further details on both, my assessment(s), as well as the proposed treatment plan, please see below.  Controlled Substance Pharmacotherapy Assessment REMS (Risk Evaluation and Mitigation Strategy)  Analgesic:  04/05/2020  1   03/23/2020  Hydrocodone-Acetamin 5-325 MG  90.00  60 Bi Lat   [id number]   Nor (2541)   0  7.50 MME  Ignatius Specking, RN  05/18/2020  2:36 PM  Signed Nursing Pain Medication Assessment:  Safety precautions to be maintained throughout the outpatient stay will include: orient to surroundings, keep bed in low position, maintain  call bell within reach at all times, provide assistance with transfer out of bed and ambulation.  Medication Inspection Compliance: Pill count conducted under aseptic conditions, in front of the patient. Neither the pills nor the bottle was removed from the patient's sight at any time. Once count was completed pills were immediately returned to the patient in their original bottle.  Medication: Hydrocodone/APAP Pill/Patch Count: 18 of 90 pills remain Pill/Patch Appearance: Markings consistent with prescribed medication Bottle Appearance: Standard pharmacy container. Clearly labeled. Filled Date: 4 / 20 / 2021 Last Medication intake:  Today  Ignatius Specking, RN  05/18/2020  2:36 PM  Signed Safety precautions to be maintained throughout the outpatient stay will include: orient to surroundings, keep bed in low position, maintain call bell within reach at all times, provide assistance with transfer out of bed and ambulation.    Pharmacokinetics: Liberation and absorption (onset of action): WNL Distribution (time to peak effect): WNL Metabolism and excretion (duration of action): WNL         Pharmacodynamics: Desired effects: Analgesia: [name] reports >50% benefit. Functional ability: Patient reports that medication allows her to accomplish basic ADLs Clinically meaningful improvement in function (CMIF): Sustained CMIF goals met Perceived effectiveness: Described as relatively effective, allowing for increase in activities of daily living (ADL) Undesirable effects: Side-effects or Adverse reactions: None reported Monitoring: [place] PMP: PDMP not reviewed this encounter. Online review of the past 56-monthperiod conducted. Compliant with practice rules and regulations Last UDS on record: Summary  Date Value Ref Range Status  04/10/2019 FINAL  Final    Comment:    ==================================================================== TOX[facility] COMP DRUG  ANALYSIS,UR ==================================================================== Test                             Result       Flag       Units Drug Present   Gabapentin                     PRESENT   Ibuprofen                      PRESENT   Naproxen                       PRESENT   Diphenhydramine                PRESENT ==================================================================== Test                      Result    Flag   Units      Ref Range   Creatinine              41               mg/dL      >=20 ==================================================================== Declared Medications:  Medication list was not provided. ==================================================================== For clinical consultation, please call [phone] ====================================================================    UDS interpretation: Compliant          Medication Assessment Form: Reviewed. Patient indicates being compliant with therapy Treatment compliance: Compliant Risk Assessment Profile: Aberrant behavior: See initial evaluations. None observed or detected today Comorbid factors increasing risk of  overdose: See initial evaluation. No additional risks detected today Opioid risk tool (ORT):  Opioid Risk  05/18/2020  Alcohol -  Illegal Drugs -  Rx Drugs -  Alcohol 0  Illegal Drugs 0  Rx Drugs 0  Age between 16-45 years  0  History of Preadolescent Sexual Abuse 0  Psychological Disease 0  Depression 0  Opioid Risk Tool Scoring 0  Opioid Risk Interpretation Low Risk    ORT Scoring interpretation table:  Score <3 = Low Risk for SUD  Score between 4-7 = Moderate Risk for SUD  Score >8 = High Risk for Opioid Abuse   Risk of substance use disorder (SUD): Low  Risk Mitigation Strategies:  Patient Counseling: Covered Patient-Prescriber Agreement (PPA): Present and active  Notification to other healthcare providers: Done  Pharmacologic Plan: No change in therapy,  at this time.             Laboratory Chemistry Profile   Renal Lab Results  Component Value Date   BUN 8 01/30/2019   CREATININE 0.91 01/30/2019   BCR 12 06/16/2018   GFR 64.07 01/30/2019   GFRAA 92 06/16/2018   GFRNONAA 80 06/16/2018     Electrolytes Lab Results  Component Value Date   NA 141 01/30/2019   K 4.2 01/30/2019   CL 106 01/30/2019   CALCIUM 10.0 01/30/2019   MG 1.8 01/30/2019     Hepatic Lab Results  Component Value Date   AST 20 01/30/2019   ALT 26 01/30/2019   ALBUMIN 4.1 01/30/2019   ALKPHOS 91 01/30/2019     ID Lab Results  Component Value Date   HIV Non Reactive 06/16/2018   SARSCOV2NAA Not Detected 06/26/2019   STAPHAUREUS  10/26/2010    NEGATIVE        The Xpert SA Assay (FDA approved for NASAL specimens only), is one component of a comprehensive surveillance program.  It is not intended to diagnose infection nor to guide or monitor treatment.   MRSAPCR NEGATIVE 10/26/2010   PREGTESTUR  11/02/2010    NEGATIVE        THE SENSITIVITY OF THIS METHODOLOGY IS >24 mIU/mL     Bone Lab Results  Component Value Date   VD25OH 34.07 01/30/2019     Endocrine Lab Results  Component Value Date   GLUCOSE 84 01/30/2019   HGBA1C 5.4 01/30/2019   TSH 1.11 01/30/2019     Neuropathy Lab Results  Component Value Date   VITAMINB12 524 01/30/2019   FOLATE >24.0 01/30/2019   HGBA1C 5.4 01/30/2019   HIV Non Reactive 06/16/2018     CNS No results found for: COLORCSF, APPEARCSF, RBCCOUNTCSF, WBCCSF, POLYSCSF, LYMPHSCSF, EOSCSF, PROTEINCSF, GLUCCSF, JCVIRUS, CSFOLI, IGGCSF, LABACHR, ACETBL, LABACHR, ACETBL   Inflammation (CRP: Acute  ESR: Chronic) No results found for: CRP, ESRSEDRATE, LATICACIDVEN   Rheumatology No results found for: RF, ANA, LABURIC, URICUR, LYMEIGGIGMAB, LYMEABIGMQN, HLAB27   Coagulation Lab Results  Component Value Date   PLT 196.0 01/30/2019   DDIMER 0.29 05/15/2016     Cardiovascular Lab Results  Component Value  Date   HGB 15.1 (H) 01/30/2019   HCT 44.8 01/30/2019     Screening Lab Results  Component Value Date   SARSCOV2NAA Not Detected 06/26/2019   STAPHAUREUS  10/26/2010    NEGATIVE        The Xpert SA Assay (FDA approved for NASAL specimens only), is one component of a comprehensive surveillance program.  It is not intended to diagnose infection nor to guide  or monitor treatment.   MRSAPCR NEGATIVE 10/26/2010   HIV Non Reactive 06/16/2018   PREGTESTUR  11/02/2010    NEGATIVE        THE SENSITIVITY OF THIS METHODOLOGY IS >24 mIU/mL     Cancer No results found for: CEA, CA125, LABCA2   Allergens No results found for: ALMOND, APPLE, ASPARAGUS, AVOCADO, BANANA, BARLEY, BASIL, BAYLEAF, GREENBEAN, LIMABEAN, WHITEBEAN, BEEFIGE, REDBEET, BLUEBERRY, BROCCOLI, CABBAGE, MELON, CARROT, CASEIN, CASHEWNUT, CAULIFLOWER, CELERY     Note: Lab results reviewed.   Recent Diagnostic Imaging Results  DG Lumbar Spine Complete W/Bend CLINICAL DATA:  Low back pain.  No trauma history submitted.  EXAM: LUMBAR SPINE - COMPLETE WITH BENDING VIEWS  COMPARISON:  12/04/2014  FINDINGS: Maintenance of vertebral body height and alignment. Mild spondylosis, with facet arthropathy at L4-5 and L5-S1. Loss of intervertebral disc height at L5-S1, progressive. Five lumbar type vertebral bodies. Sacroiliac joints are symmetric. With flexion, limited range of motion but no instability. With extension, good range of motion and no instability.  IMPRESSION: Lower lumbar spondylosis, without acute osseous finding.  Electronically Signed   By: Abigail Miyamoto M.D.   On: 05/19/2020 09:27 DG HIP UNILAT W OR W/O PELVIS 2-3 VIEWS LEFT CLINICAL DATA:  Hip pain  EXAM: DG HIP (WITH OR WITHOUT PELVIS) 2-3V LEFT  COMPARISON:  None.  FINDINGS: There is no evidence of hip fracture or dislocation. There is no evidence of arthropathy or other focal bone abnormality.  IMPRESSION: Negative.  Electronically  Signed   By: Misty Stanley M.D.   On: 05/19/2020 09:26 DG HIP UNILAT W OR W/O PELVIS 2-3 VIEWS RIGHT CLINICAL DATA:  Right hip pain.  EXAM: DG HIP (WITH OR WITHOUT PELVIS) 2-3V RIGHT  COMPARISON:  None.  FINDINGS: There is no evidence of hip fracture or dislocation. There is no evidence of arthropathy or other focal bone abnormality.  IMPRESSION: Negative.  Electronically Signed   By: Misty Stanley M.D.   On: 05/19/2020 09:25  Complexity Note: Imaging results reviewed. Results shared with Ms. Steeber, using Layman's terms.                               Meds   Current Outpatient Medications:  .  collagenase (SANTYL) ointment, Apply 1 application topically daily., Disp: , Rfl:  .  DULoxetine (CYMBALTA) 20 MG capsule, Take 2 capsules (40 mg total) by mouth daily., Disp: 60 capsule, Rfl: 3 .  gabapentin (NEURONTIN) 300 MG capsule, Take 1 capsule (300 mg total) by mouth 2 (two) times daily., Disp: 60 capsule, Rfl: 3 .  [START ON 06/04/2020] HYDROcodone-acetaminophen (NORCO/VICODIN) 5-325 MG tablet, Take 1-2 tablets by mouth daily as needed for severe pain. Must last 60 days., Disp: 90 tablet, Rfl: 0 .  Melatonin 5 MG CAPS, Take 1 capsule by mouth daily., Disp: , Rfl:  .  naproxen sodium (ALEVE) 220 MG tablet, Take 440 mg by mouth daily as needed., Disp: , Rfl:  .  omeprazole (PRILOSEC) 20 MG capsule, TAKE 1 CAPSULE BY MOUTH EVERY DAY, Disp: 90 capsule, Rfl: 1 .  ondansetron (ZOFRAN-ODT) 8 MG disintegrating tablet, Take 1 tablet (8 mg total) by mouth daily as needed for nausea or vomiting., Disp: 30 tablet, Rfl: 2 .  Turmeric (QC TUMERIC COMPLEX PO), Take by mouth., Disp: , Rfl:  .  valACYclovir (VALTREX) 500 MG tablet, Take 500 mg by mouth as needed. , Disp: , Rfl:  .  azithromycin (ZITHROMAX)  250 MG tablet, , Disp: , Rfl:   ROS  Constitutional: Denies any fever or chills Gastrointestinal: No reported hemesis, hematochezia, vomiting, or acute GI distress Musculoskeletal: Denies any  acute onset joint swelling, redness, loss of ROM, or weakness Neurological: No reported episodes of acute onset apraxia, aphasia, dysarthria, agnosia, amnesia, paralysis, loss of coordination, or loss of consciousness  Allergies  Ms. Nipper has No Known Allergies.  PFSH  Drug: Ms. Dimaano  reports no history of drug use. Alcohol:  reports previous alcohol use. Tobacco:  reports that she quit smoking about 17 years ago. Her smoking use included cigarettes. She has never used smokeless tobacco. Medical:  has a past medical history of Arthritis, Degenerative disorder of bone, Genuine stress incontinence, female (2012), GERD (gastroesophageal reflux disease), Migraines, and Obesity. Surgical: Ms. Tortora  has a past surgical history that includes Laparoscopic hysterectomy (10/23/2010); Midurethral sling (05/02/2011); and excision of vulvar lesion (05/02/2011). Family: family history includes Arthritis in her maternal grandmother and mother; Cancer in her father and paternal grandfather; Diabetes in her son; Heart attack in her maternal grandfather, mother, and paternal grandmother; Hypertension in her mother.  Constitutional Exam  General appearance: Well nourished, well developed, and well hydrated. In no apparent acute distress Vitals:   05/18/20 1338  BP: (!) 154/99  Pulse: 78  Resp: 18  Temp: (!) 97.3 F (36.3 C)  SpO2: 100%  Weight: 210 lb (95.3 kg)  Height: '5\' 8"'  (1.727 m)   BMI Assessment: Estimated body mass index is 31.93 kg/m as calculated from the following:   Height as of this encounter: '5\' 8"'  (1.727 m).   Weight as of this encounter: 210 lb (95.3 kg).  BMI interpretation table: BMI level Category Range association with higher incidence of chronic pain  <18 kg/m2 Underweight   18.5-24.9 kg/m2 Ideal body weight   25-29.9 kg/m2 Overweight Increased incidence by 20%  30-34.9 kg/m2 Obese (Class I) Increased incidence by 68%  35-39.9 kg/m2 Severe obesity (Class II) Increased  incidence by 136%  >40 kg/m2 Extreme obesity (Class III) Increased incidence by 254%   Patient's current BMI Ideal Body weight  Body mass index is 31.93 kg/m. Ideal body weight: 63.9 kg (140 lb 14 oz) Adjusted ideal body weight: 76.4 kg (168 lb 8.4 oz)   BMI Readings from Last 4 Encounters:  05/18/20 31.93 kg/m  09/23/19 31.32 kg/m  08/19/19 30.41 kg/m  06/16/19 32.23 kg/m   Wt Readings from Last 4 Encounters:  05/18/20 210 lb (95.3 kg)  09/23/19 200 lb (90.7 kg)  08/19/19 200 lb (90.7 kg)  06/16/19 212 lb (96.2 kg)    Psych/Mental status: Alert, oriented x 3 (person, place, & time)       Eyes: PERLA Respiratory: No evidence of acute respiratory distress  Cervical Spine Exam  Skin & Axial Inspection: No masses, redness, edema, swelling, or associated skin lesions Alignment: Symmetrical Functional ROM: Unrestricted ROM      Stability: No instability detected Muscle Tone/Strength: Functionally intact. No obvious neuro-muscular anomalies detected. Sensory (Neurological): Unimpaired Palpation: No palpable anomalies              Upper Extremity (UE) Exam    Side: Right upper extremity  Side: Left upper extremity  Skin & Extremity Inspection: Skin color, temperature, and hair growth are WNL. No peripheral edema or cyanosis. No masses, redness, swelling, asymmetry, or associated skin lesions. No contractures.  Skin & Extremity Inspection: Skin color, temperature, and hair growth are WNL. No peripheral edema or cyanosis. No  masses, redness, swelling, asymmetry, or associated skin lesions. No contractures.  Functional ROM: Unrestricted ROM          Functional ROM: Unrestricted ROM          Muscle Tone/Strength: Functionally intact. No obvious neuro-muscular anomalies detected.  Muscle Tone/Strength: Functionally intact. No obvious neuro-muscular anomalies detected.  Sensory (Neurological): Unimpaired          Sensory (Neurological): Unimpaired          Palpation: No palpable  anomalies              Palpation: No palpable anomalies              Provocative Test(s):  Phalen's test: deferred Tinel's test: deferred Apley's scratch test (touch opposite shoulder):  Action 1 (Across chest): deferred Action 2 (Overhead): deferred Action 3 (LB reach): deferred   Provocative Test(s):  Phalen's test: deferred Tinel's test: deferred Apley's scratch test (touch opposite shoulder):  Action 1 (Across chest): deferred Action 2 (Overhead): deferred Action 3 (LB reach): deferred    Thoracic Spine Area Exam  Skin & Axial Inspection: No masses, redness, or swelling Alignment: Symmetrical Functional ROM: Unrestricted ROM Stability: No instability detected Muscle Tone/Strength: Functionally intact. No obvious neuro-muscular anomalies detected. Sensory (Neurological): Unimpaired Muscle strength & Tone: No palpable anomalies  Lumbar Exam  Skin & Axial Inspection: No masses, redness, or swelling Alignment: Symmetrical Functional ROM: Pain restricted ROM       Stability: No instability detected Muscle Tone/Strength: Functionally intact. No obvious neuro-muscular anomalies detected. Sensory (Neurological): Musculoskeletal pain pattern   Gait & Posture Assessment  Ambulation: Unassisted Gait: Relatively normal for age and body habitus Posture: WNL   Lower Extremity Exam    Side: Right lower extremity  Side: Left lower extremity  Stability: No instability observed          Stability: No instability observed          Skin & Extremity Inspection: Skin color, temperature, and hair growth are WNL. No peripheral edema or cyanosis. No masses, redness, swelling, asymmetry, or associated skin lesions. No contractures.  Skin & Extremity Inspection: Skin color, temperature, and hair growth are WNL. No peripheral edema or cyanosis. No masses, redness, swelling, asymmetry, or associated skin lesions. No contractures.  Functional ROM: Pain restricted ROM for knee joint           Functional ROM: Pain restricted ROM for knee joint          Muscle Tone/Strength: Functionally intact. No obvious neuro-muscular anomalies detected.  Muscle Tone/Strength: Functionally intact. No obvious neuro-muscular anomalies detected.  Sensory (Neurological): Arthropathic arthralgia        Sensory (Neurological): Arthropathic arthralgia        DTR: Patellar: deferred today Achilles: deferred today Plantar: deferred today  DTR: Patellar: deferred today Achilles: deferred today Plantar: deferred today  Palpation: No palpable anomalies  Palpation: No palpable anomalies   Assessment   Status Diagnosis  Persistent Persistent Persistent 1. Lumbar spondylosis   2. Lumbar radiculopathy   3. Bilateral primary osteoarthritis of knee   4. Bilateral hip pain   5. Chronic SI joint pain   6. Lumbar facet arthropathy   7. Musculoskeletal back pain   8. Chronic pain syndrome   9. Other intervertebral disc degeneration, lumbar region      Patient's lumbar spine x-ray shows lumbar spondylosis and facet arthropathy.  She does endorse radiating pain in a dermatomal fashion down her leg.  This occurs intermittently.  To evaluate for  lumbar nerve root compression, recommend lumbar MRI without contrast.  We will place order.  Plan of Care  Pharmacotherapy (Medications Ordered): Meds ordered this encounter  Medications  . HYDROcodone-acetaminophen (NORCO/VICODIN) 5-325 MG tablet    Sig: Take 1-2 tablets by mouth daily as needed for severe pain. Must last 60 days.    Dispense:  90 tablet    Refill:  0     STOP ACT - Not applicable. Fill one day early if pharmacy is closed on scheduled refill date.    Orders:  Orders Placed This Encounter  Procedures  . MR LUMBAR SPINE WO CONTRAST    Patient presents with axial pain with possible radicular component.  In addition to any acute findings, please report on:  1. Facet (Zygapophyseal) joint DJD (Hypertrophy, space narrowing, subchondral  sclerosis, and/or osteophyte formation) 2. DDD and/or IVDD (Loss of disc height, desiccation or "Black disc disease") 3. Pars defects 4. Spondylolisthesis, spondylosis, and/or spondyloarthropathies (include Degree/Grade of displacement in mm) 5. Vertebral body Fractures, including age (old, new/acute) 41. Modic Type Changes 7. Demineralization 8. Bone pathology 9. Central, Lateral Recess, and/or Foraminal Stenosis (include AP diameter of stenosis in mm) 10. Surgical changes (hardware type, status, and presence of fibrosis)  NOTE: Please specify level(s) and laterality.    Standing Status:   Future    Standing Expiration Date:   08/19/2020    Order Specific Question:   What is the patient's sedation requirement?    Answer:   No Sedation    Order Specific Question:   Does the patient have a pacemaker or implanted devices?    Answer:   No    Order Specific Question:   Preferred imaging location?    Answer:   [facility] (table limit-350lbs)    Order Specific Question:   Call Results- Best Contact Number?    Answer:   [phone] ([facility])    Order Specific Question:   Radiology Contrast Protocol - do NOT remove file path    Answer:   [email or web address]  . ToxASSURE Select 13 (MW), Urine    Volume: 30 ml(s). Minimum 3 ml of urine is needed. Document temperature of fresh sample. Indications: Long term (current) use of opiate analgesic (Z79.891)    Lab Orders     ToxASSURE Select 13 (MW), Urine  Planned follow-up:   Return in about 2 months (around 07/28/2020) for Medication Management, in person.     s/p IA hyalgan #1 on 08/19/2019, #2 09/23/2019 not very effective unfortunately       Recent Visits Date Type Provider Dept  05/18/20 Office Visit Gillis Santa, MD [facility]  03/23/20 Office Visit Gillis Santa, MD [facility]  Showing recent visits within past 90 days and meeting all other requirements   Future  Appointments Date Type Provider Dept  07/28/20 Appointment Gillis Santa, MD [facility]  Showing future appointments within next 90 days and meeting all other requirements   Primary Care Physician: Jodelle Green, FNP Location: [facility] Note by: Gillis Santa, MD Date: 05/18/2020; Time: 10:11 AM  Note: This dictation was prepared with Dragon dictation. Any transcriptional errors that may result from this process are unintentional.

## 2020-05-19 ENCOUNTER — Encounter: Payer: Self-pay | Admitting: Student in an Organized Health Care Education/Training Program

## 2020-05-19 NOTE — Addendum Note (Signed)
Addended by: Gillis Santa on: 05/19/2020 10:13 AM   Modules accepted: Orders

## 2020-05-24 LAB — TOXASSURE SELECT 13 (MW), URINE

## 2020-07-06 ENCOUNTER — Telehealth: Payer: Self-pay | Admitting: Student in an Organized Health Care Education/Training Program

## 2020-07-06 ENCOUNTER — Other Ambulatory Visit: Payer: Self-pay | Admitting: Student in an Organized Health Care Education/Training Program

## 2020-07-06 DIAGNOSIS — G894 Chronic pain syndrome: Secondary | ICD-10-CM

## 2020-07-06 MED ORDER — GABAPENTIN 300 MG PO CAPS: 300.0000 mg | ORAL_CAPSULE | Freq: Two times a day (BID) | ORAL | 3 refills | Status: DC

## 2020-07-06 NOTE — Telephone Encounter (Signed)
Will you send a prescription please?

## 2020-07-06 NOTE — Telephone Encounter (Signed)
Patient notified

## 2020-07-06 NOTE — Telephone Encounter (Signed)
Patient called stating she is almost out of gabapentin. When she picked up in April they gave her 180 pills. Her appt is not until Aug 12. Can she get another script sent in plz.

## 2020-07-12 ENCOUNTER — Encounter: Payer: Self-pay | Admitting: Nurse Practitioner

## 2020-07-12 ENCOUNTER — Telehealth (INDEPENDENT_AMBULATORY_CARE_PROVIDER_SITE_OTHER): Payer: 59 | Admitting: Nurse Practitioner

## 2020-07-12 ENCOUNTER — Other Ambulatory Visit: Payer: Self-pay

## 2020-07-12 VITALS — Temp 97.7°F | Ht 68.0 in | Wt 200.0 lb

## 2020-07-12 DIAGNOSIS — J069 Acute upper respiratory infection, unspecified: Secondary | ICD-10-CM | POA: Diagnosis not present

## 2020-07-12 DIAGNOSIS — J019 Acute sinusitis, unspecified: Secondary | ICD-10-CM | POA: Diagnosis not present

## 2020-07-12 MED ORDER — SACCHAROMYCES BOULARDII 250 MG PO CAPS: 250.0000 mg | ORAL_CAPSULE | Freq: Two times a day (BID) | ORAL | 0 refills | Status: AC

## 2020-07-12 MED ORDER — ALBUTEROL SULFATE HFA 108 (90 BASE) MCG/ACT IN AERS
2.0000 | INHALATION_SPRAY | Freq: Four times a day (QID) | RESPIRATORY_TRACT | 0 refills | Status: DC | PRN
Start: 2020-07-12 — End: 2020-09-27

## 2020-07-12 MED ORDER — AMOXICILLIN-POT CLAVULANATE 875-125 MG PO TABS: 1.0000 | ORAL_TABLET | Freq: Two times a day (BID) | ORAL | 0 refills | Status: AC

## 2020-07-12 MED ORDER — DEXTROMETHORPHAN-GUAIFENESIN 5-100 MG/5ML PO LIQD
10.0000 mL | Freq: Every evening | ORAL | 0 refills | Status: AC | PRN
Start: 2020-07-12 — End: 2020-07-19

## 2020-07-12 MED ORDER — PREDNISONE 10 MG PO TABS
ORAL_TABLET | ORAL | 0 refills | Status: DC
Start: 2020-07-12 — End: 2020-07-28

## 2020-07-12 NOTE — Progress Notes (Signed)
Virtual Visit via Telephone Note  This visit type was conducted due to national recommendations for restrictions regarding the COVID-19 pandemic (e.g. social distancing).  This format is felt to be most appropriate for this patient at this time.  All issues noted in this document were discussed and addressed.  No physical exam was performed (except for noted visual exam findings with Video Visits).   I connected with@ on 07/13/20 at 11:00 AM EDT by a video enabled telemedicine application or telephone and verified that I am speaking with the correct person using two identifiers. Location patient: home Location provider: work or home office Persons participating in the virtual visit: patient, provider  I discussed the limitations, risks, security and privacy concerns of performing an evaluation and management service by telephone and the availability of in person appointments. I also discussed with the patient that there may be a patient responsible charge related to this service. The patient expressed understanding and agreed to proceed.  Interactive audio and video telecommunications were attempted between this provider and patient, however failed, due to patient having technical difficulties OR patient did not have access to video capability.  We continued and completed visit with audio only.   Reason for visit: URI  HPI: 59 yo noted onset 07/03/20 of feeling bad in a general way. She worked on [employer] but developed a dry, itchy cough. 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 This weekend, she had swollen feeling throat -very painful -on fire- and nasal congestion. She has been taking Dayquil/Nyquil. Used Afrin nose spray. She did a Covid test on Sunday that was negative (She swabbed her own throat).  She now has maxillary pressure, headache, upper molars are aching,  purulent yellow discharge from her nose and thinks she has a sinus infection.  She has  hoarseness and congested cough, audible wheezing,  fever on Sun of 100.2- today 97.7. She has no SOB at rest, but notices DOE after walking the dog. Sits and rests. Pulse oximetry  95% -94%. Not Covid vaccinated. Normally healthy. Non smoker. No asthma history.   ROS: See pertinent positives and negatives per HPI.  Past Medical History:  Diagnosis Date   Arthritis    Degenerative disorder of bone    Genuine stress incontinence, female 2012   patient had surgical repair   GERD (gastroesophageal reflux disease)    Migraines    Obesity     Past Surgical History:  Procedure Laterality Date   excision of vulvar lesion  05/02/2011   LAPAROSCOPIC HYSTERECTOMY  10/23/2010   mennorhagia/fibroids   Midurethral sling  05/02/2011    Family History  Problem Relation Age of Onset   Heart attack Mother    Hypertension Mother    Arthritis Mother    Cancer Father    Diabetes Son    Arthritis Maternal Grandmother    Heart attack Maternal Grandfather    Heart attack Paternal Grandmother    Cancer Paternal Grandfather     SOCIAL HX: non smoker   Current Outpatient Medications:    DULoxetine (CYMBALTA) 20 MG capsule, Take 2 capsules (40 mg total) by mouth daily., Disp: 60 capsule, Rfl: 3   gabapentin (NEURONTIN) 300 MG capsule, Take 1 capsule (300 mg total) by mouth 2 (two) times daily., Disp: 60 capsule, Rfl: 3   HYDROcodone-acetaminophen (NORCO/VICODIN) 5-325 MG tablet, Take 1-2 tablets by mouth daily as needed for severe pain. Must last 60 days., Disp: 90 tablet, Rfl:  0   Melatonin 5 MG CAPS, Take 1 capsule by mouth daily., Disp: , Rfl:    Multiple Vitamin (MULTIVITAMIN) tablet, Take 1 tablet by mouth daily., Disp: , Rfl:    naproxen sodium (ALEVE) 220 MG tablet, Take 440 mg by mouth daily as needed., Disp: , Rfl:    omeprazole (PRILOSEC) 20 MG capsule, TAKE 1 CAPSULE BY MOUTH EVERY DAY, Disp: 90 capsule, Rfl: 1   valACYclovir (VALTREX) 500 MG tablet, Take 500  mg by mouth as needed. , Disp: , Rfl:    albuterol (VENTOLIN HFA) 108 (90 Base) MCG/ACT inhaler, Inhale 2 puffs into the lungs every 6 (six) hours as needed for wheezing or shortness of breath., Disp: 6.7 g, Rfl: 0   amoxicillin-clavulanate (AUGMENTIN) 875-125 MG tablet, Take 1 tablet by mouth 2 (two) times daily for 7 days., Disp: 14 tablet, Rfl: 0   Dextromethorphan-guaiFENesin 5-100 MG/5ML LIQD, Take 10 mLs by mouth at bedtime as needed for up to 7 days (for cough)., Disp: 70 mL, Rfl: 0   predniSONE (DELTASONE) 10 MG tablet, Take 6 tablets by mouth today and 5 tablets tomorrow and lower by  1 tablet daily until off. 6-5-4-3-2-1- off. A 6 day taper., Disp: 20 tablet, Rfl: 0   saccharomyces boulardii (FLORASTOR) 250 MG capsule, Take 1 capsule (250 mg total) by mouth 2 (two) times daily for 7 days., Disp: 14 capsule, Rfl: 0  EXAM:  VITALS per patient if applicable: Wt 200 lbs, 97.7, pulse ox 95%  GENERAL: Sounds alert, oriented, sounds fatigued and mildly ill   HEENT: Hoarse voice, nasal congestion tone to voice   LUNGS: No sounds of respiratory distress, breathing rate -able to talk in complete sentences, coughs- very congested, no audible gasping or wheezing  CV: no obvious cyanosis  MS: moves all visible extremities without noticeable abnormality  PSYCH/NEURO: pleasant and cooperative, no obvious depression or anxiety, speech and thought processing grossly intact  ASSESSMENT AND PLAN:  Discussed the following assessment and plan:  Upper respiratory tract infection, unspecified type  Acute non-recurrent sinusitis, unspecified location  Upper respiratory tract infection Please get another Covid test today [phone] . Quarantine until results known.   Acute non-recurrent sinusitis Augmentin 875 mg BID  for 7 days   [employer] or other probiotic and Activia or other yogurt  Albuterol  Inhaler as needed for tightness/wheezing and frequent cough   Prednisone  taper over  6 days   Dayquil if needed  Mucinex dm nighttime cough as needed    If symptoms worsen between now and then please go to the [facility] for in person examination.   F/up Video visit on Thurs. Discussed the assessment and treatment plan with the patient. The patient was provided an opportunity to ask questions and all were answered. The patient agreed with the plan and demonstrated an understanding of the instructions.  I provided 18 minutes of non-face-to-face time during this encounter.  Denice Paradise, NP Adult Nurse Practitioner [place] [phone]

## 2020-07-12 NOTE — Patient Instructions (Addendum)
Please get another Covid test today [phone] . Quarantine   Augmentin antibiotic for 7 days   [employer] or other probiotic and Activia or other yogurt  Albuterol  Inhaler as needed for tightness/wheezing and frequent cough   Prednisone  taper over 6 days   Dayquil if needed  Mucinex dm nighttime cough as needed   F/up Video visit on Thurs   If symptoms worsen between now and then please go to the [facility] for in person examination. See below information. Rest, hydrate, monitor for fever, pulse oxygen sats and to ER if <90%.        Sinusitis, Adult Sinusitis is inflammation of your sinuses. Sinuses are hollow spaces in the bones around your face. Your sinuses are located:  Around your eyes.  In the middle of your forehead.  Behind your nose.  In your cheekbones. Mucus normally drains out of your sinuses. When your nasal tissues become inflamed or swollen, mucus can become trapped or blocked. This allows bacteria, viruses, and fungi to grow, which leads to infection. Most infections of the sinuses are caused by a virus. Sinusitis can develop quickly. It can last for up to 4 weeks (acute) or for more than 12 weeks (chronic). Sinusitis often develops after a cold. What are the causes? This condition is caused by anything that creates swelling in the sinuses or stops mucus from draining. This includes:  Allergies.  Asthma.  Infection from bacteria or viruses.  Deformities or blockages in your nose or sinuses.  Abnormal growths in the nose (nasal polyps).  Pollutants, such as chemicals or irritants in the air.  Infection from fungi (rare). What increases the risk? You are more likely to develop this condition if you:  Have a weak body defense system (immune system).  Do a lot of swimming or diving.  Overuse nasal sprays.  Smoke. What are the signs or symptoms? The main symptoms of this condition are pain and a feeling of pressure around the  affected sinuses. Other symptoms include:  Stuffy nose or congestion.  Thick drainage from your nose.  Swelling and warmth over the affected sinuses.  Headache.  Upper toothache.  A cough that may get worse at night.  Extra mucus that collects in the throat or the back of the nose (postnasal drip).  Decreased sense of smell and taste.  Fatigue.  A fever.  Sore throat.  Bad breath. How is this diagnosed? This condition is diagnosed based on:  Your symptoms.  Your medical history.  A physical exam.  Tests to find out if your condition is acute or chronic. This may include: ? Checking your nose for nasal polyps. ? Viewing your sinuses using a device that has a light (endoscope). ? Testing for allergies or bacteria. ? Imaging tests, such as an MRI or CT scan. In rare cases, a bone biopsy may be done to rule out more serious types of fungal sinus disease. How is this treated? Treatment for sinusitis depends on the cause and whether your condition is chronic or acute.  If caused by a virus, your symptoms should go away on their own within 10 days. You may be given medicines to relieve symptoms. They include: ? Medicines that shrink swollen nasal passages (topical intranasal decongestants). ? Medicines that treat allergies (antihistamines). ? A spray that eases inflammation of the nostrils (topical intranasal corticosteroids). ? Rinses that help get rid of thick mucus in your nose (nasal saline washes).  If caused by bacteria, your health  care provider may recommend waiting to see if your symptoms improve. Most bacterial infections will get better without antibiotic medicine. You may be given antibiotics if you have: ? A severe infection. ? A weak immune system.  If caused by narrow nasal passages or nasal polyps, you may need to have surgery. Follow these instructions at home: Medicines  Take, use, or apply over-the-counter and prescription medicines only as told by  your health care provider. These may include nasal sprays.  If you were prescribed an antibiotic medicine, take it as told by your health care provider. Do not stop taking the antibiotic even if you start to feel better. Hydrate and humidify   Drink enough fluid to keep your urine pale yellow. Staying hydrated will help to thin your mucus.  Use a cool mist humidifier to keep the humidity level in your home above 50%.  Inhale steam for 10-15 minutes, 3-4 times a day, or as told by your health care provider. You can do this in the bathroom while a hot shower is running.  Limit your exposure to cool or dry air. Rest  Rest as much as possible.  Sleep with your head raised (elevated).  Make sure you get enough sleep each night. General instructions   Apply a warm, moist washcloth to your face 3-4 times a day or as told by your health care provider. This will help with discomfort.  Wash your hands often with soap and water to reduce your exposure to germs. If soap and water are not available, use hand sanitizer.  Do not smoke. Avoid being around people who are smoking (secondhand smoke).  Keep all follow-up visits as told by your health care provider. This is important. Contact a health care provider if:  You have a fever.  Your symptoms get worse.  Your symptoms do not improve within 10 days. Get help right away if:  You have a severe headache.  You have persistent vomiting.  You have severe pain or swelling around your face or eyes.  You have vision problems.  You develop confusion.  Your neck is stiff.  You have trouble breathing. Summary  Sinusitis is soreness and inflammation of your sinuses. Sinuses are hollow spaces in the bones around your face.  This condition is caused by nasal tissues that become inflamed or swollen. The swelling traps or blocks the flow of mucus. This allows bacteria, viruses, and fungi to grow, which leads to infection.  If you were  prescribed an antibiotic medicine, take it as told by your health care provider. Do not stop taking the antibiotic even if you start to feel better.  Keep all follow-up visits as told by your health care provider. This is important. This information is not intended to replace advice given to you by your health care provider. Make sure you discuss any questions you have with your health care provider. Document Revised: 05/05/2018 Document Reviewed: 05/05/2018 Elsevier Patient Education  2020 [place].  Upper Respiratory Infection, Adult An upper respiratory infection (URI) is a common viral infection of the nose, throat, and upper air passages that lead to the lungs. The most common type of URI is the common cold. URIs usually get better on their own, without medical treatment. What are the causes? A URI is caused by a virus. You may catch a virus by:  Breathing in droplets from an infected person's cough or sneeze.  Touching something that has been exposed to the virus (contaminated) and then touching your  mouth, nose, or eyes. What increases the risk? You are more likely to get a URI if:  You are very young or very old.  It is autumn or winter.  You have close contact with others, such as at a daycare, school, or health care facility.  You smoke.  You have long-term (chronic) heart or lung disease.  You have a weakened disease-fighting (immune) system.  You have nasal allergies or asthma.  You are experiencing a lot of stress.  You work in an area that has poor air circulation.  You have poor nutrition. What are the signs or symptoms? A URI usually involves some of the following symptoms:  Runny or stuffy (congested) nose.  Sneezing.  Cough.  Sore throat.  Headache.  Fatigue.  Fever.  Loss of appetite.  Pain in your forehead, behind your eyes, and over your cheekbones (sinus pain).  Muscle aches.  Redness or irritation of the eyes.  Pressure in the  ears or face. How is this diagnosed? This condition may be diagnosed based on your medical history and symptoms, and a physical exam. Your health care provider may use a cotton swab to take a mucus sample from your nose (nasal swab). This sample can be tested to determine what virus is causing the illness. How is this treated? URIs usually get better on their own within 7-10 days. You can take steps at home to relieve your symptoms. Medicines cannot cure URIs, but your health care provider may recommend certain medicines to help relieve symptoms, such as:  Over-the-counter cold medicines.  Cough suppressants. Coughing is a type of defense against infection that helps to clear the respiratory system, so take these medicines only as recommended by your health care provider.  Fever-reducing medicines. Follow these instructions at home: Activity  Rest as needed.  If you have a fever, stay home from work or school until your fever is gone or until your health care provider says you are no longer contagious. Your health care provider may have you wear a face mask to prevent your infection from spreading. Relieving symptoms  Gargle with a salt-water mixture 3-4 times a day or as needed. To make a salt-water mixture, completely dissolve -1 tsp of salt in 1 cup of warm water.  Use a cool-mist humidifier to add moisture to the air. This can help you breathe more easily. Eating and drinking   Drink enough fluid to keep your urine pale yellow.  Eat soups and other clear broths. General instructions   Take over-the-counter and prescription medicines only as told by your health care provider. These include cold medicines, fever reducers, and cough suppressants.  Do not use any products that contain nicotine or tobacco, such as cigarettes and e-cigarettes. If you need help quitting, ask your health care provider.  Stay away from secondhand smoke.  Stay up to date on all immunizations,  including the yearly (annual) flu vaccine.  Keep all follow-up visits as told by your health care provider. This is important. How to prevent the spread of infection to others   URIs can be passed from person to person (are contagious). To prevent the infection from spreading: ? Wash your hands often with soap and water. If soap and water are not available, use hand sanitizer. ? Avoid touching your mouth, face, eyes, or nose. ? Cough or sneeze into a tissue or your sleeve or elbow instead of into your hand or into the air. Contact a health care provider if:  You  are getting worse instead of better.  You have a fever or chills.  Your mucus is brown or red.  You have yellow or brown discharge coming from your nose.  You have pain in your face, especially when you bend forward.  You have swollen neck glands.  You have pain while swallowing.  You have white areas in the back of your throat. Get help right away if:  You have shortness of breath that gets worse.  You have severe or persistent: ? Headache. ? Ear pain. ? Sinus pain. ? Chest pain.  You have chronic lung disease along with any of the following: ? Wheezing. ? Prolonged cough. ? Coughing up blood. ? A change in your usual mucus.  You have a stiff neck.  You have changes in your: ? Vision. ? Hearing. ? Thinking. ? Mood. Summary  An upper respiratory infection (URI) is a common infection of the nose, throat, and upper air passages that lead to the lungs.  A URI is caused by a virus.  URIs usually get better on their own within 7-10 days.  Medicines cannot cure URIs, but your health care provider may recommend certain medicines to help relieve symptoms. This information is not intended to replace advice given to you by your health care provider. Make sure you discuss any questions you have with your health care provider. Document Revised: 12/11/2018 Document Reviewed: 07/19/2017 Elsevier Patient  Education  [place].

## 2020-07-13 NOTE — Assessment & Plan Note (Signed)
Augmentin 875 mg BID  for 7 days   [employer] or other probiotic and Activia or other yogurt  Albuterol  Inhaler as needed for tightness/wheezing and frequent cough   Prednisone  taper over 6 days   Dayquil if needed  Mucinex dm nighttime cough as needed

## 2020-07-13 NOTE — Assessment & Plan Note (Addendum)
Please get another Covid test today [phone] . Quarantine until results known.  Rest, hydrate, monitor for fever, pulse oxygen sats and to ER if <90%.

## 2020-07-14 ENCOUNTER — Ambulatory Visit: Payer: 59 | Attending: Internal Medicine

## 2020-07-14 ENCOUNTER — Other Ambulatory Visit: Payer: Self-pay | Admitting: Student in an Organized Health Care Education/Training Program

## 2020-07-14 DIAGNOSIS — G894 Chronic pain syndrome: Secondary | ICD-10-CM

## 2020-07-14 DIAGNOSIS — Z20822 Contact with and (suspected) exposure to covid-19: Secondary | ICD-10-CM

## 2020-07-15 LAB — NOVEL CORONAVIRUS, NAA: SARS-CoV-2, NAA: NOT DETECTED

## 2020-07-15 LAB — SARS-COV-2, NAA 2 DAY TAT

## 2020-07-22 ENCOUNTER — Other Ambulatory Visit: Payer: Self-pay | Admitting: Nurse Practitioner

## 2020-07-22 NOTE — Telephone Encounter (Signed)
Received request for prednisone refill.  Pt was seen for an acute visit and this was prescribed as a taper for the acute issue.  Please call pt and see if she is still having problems.  If still having issues, will need to be seen.

## 2020-07-25 ENCOUNTER — Ambulatory Visit (INDEPENDENT_AMBULATORY_CARE_PROVIDER_SITE_OTHER): Payer: 59 | Admitting: Family Medicine

## 2020-07-25 ENCOUNTER — Encounter: Payer: Self-pay | Admitting: Family Medicine

## 2020-07-25 ENCOUNTER — Other Ambulatory Visit (HOSPITAL_COMMUNITY)
Admission: RE | Admit: 2020-07-25 | Discharge: 2020-07-25 | Disposition: A | Discharge status: Home / Self Care | Payer: 59 | Source: Ambulatory Visit | Attending: Family Medicine | Admitting: Family Medicine

## 2020-07-25 ENCOUNTER — Other Ambulatory Visit: Payer: Self-pay

## 2020-07-25 VITALS — BP 152/93 | HR 72 | Wt 219.0 lb

## 2020-07-25 DIAGNOSIS — L578 Other skin changes due to chronic exposure to nonionizing radiation: Secondary | ICD-10-CM | POA: Insufficient documentation

## 2020-07-25 DIAGNOSIS — Z780 Asymptomatic menopausal state: Secondary | ICD-10-CM

## 2020-07-25 DIAGNOSIS — Z01419 Encounter for gynecological examination (general) (routine) without abnormal findings: Secondary | ICD-10-CM

## 2020-07-25 HISTORY — DX: Other skin changes due to chronic exposure to nonionizing radiation: L57.8

## 2020-07-25 NOTE — Progress Notes (Signed)
Discuss hot flashes  Colonoscopy Last tdap 06/16/2018

## 2020-07-25 NOTE — Assessment & Plan Note (Signed)
Recommend yearly dermatologic exams

## 2020-07-25 NOTE — Assessment & Plan Note (Signed)
Has a strong family history of heart disease.  For this reason and given the length of time do not think that HRT is a good option for this patient.  Continue lifestyle modifications including layered clothing, avoidance of hot beverages, trending down the thermostat a few degrees. For vaginal issues recommend good water-based lubricant, and engaging frequently and often. Role of Cymbalta discussed.

## 2020-07-25 NOTE — Progress Notes (Signed)
  Subjective:     [name] is a 59 y.o. female and is here for a comprehensive physical exam. The patient reports no problems.  Patient reports ongoing hot flashes, sweating, lack of sexual desire, vaginal dryness, inability to achieve orgasm.  Most of the symptoms started well before she began a new medication called Cymbalta although she feels like it might have gotten worse with the addition of this medicine.  She has tried and failed black cohosh in the past.  She is status post supracervical hysterectomy but still has a cervix in place.   The following portions of the patient's history were reviewed and updated as appropriate: allergies, current medications, past family history, past medical history, past social history, past surgical history and problem list.  Review of Systems Pertinent items noted in HPI and remainder of comprehensive ROS otherwise negative.   Objective:    BP (!) 152/93   Pulse 72   Wt 219 lb (99.3 kg)   BMI 33.30 kg/m  General appearance: alert, cooperative and appears stated age Head: Normocephalic, without obvious abnormality, atraumatic Neck: no adenopathy, supple, symmetrical, trachea midline and thyroid not enlarged, symmetric, no tenderness/mass/nodules Lungs: clear to auscultation bilaterally Breasts: normal appearance, no masses or tenderness Heart: regular rate and rhythm, S1, S2 normal, no murmur, click, rub or gallop Abdomen: soft, non-tender; bowel sounds normal; no masses,  no organomegaly and Umbilical hernia Pelvic: cervix normal in appearance, external genitalia normal, no adnexal masses or tenderness, no cervical motion tenderness, uterus surgically absent and vagina normal without discharge Extremities: extremities normal, atraumatic, no cyanosis or edema Pulses: 2+ and symmetric Skin: hyperpigmentation - generalized and Multiple moles and sun damage noted Lymph nodes: Cervical, supraclavicular, and axillary nodes normal. Neurologic:  Grossly normal    Assessment:    Healthy female exam.      Plan:      Problem List Items Addressed This Visit      Unprioritized   Menopause    Has a strong family history of heart disease.  For this reason and given the length of time do not think that HRT is a good option for this patient.  Continue lifestyle modifications including layered clothing, avoidance of hot beverages, trending down the thermostat a few degrees. For vaginal issues recommend good water-based lubricant, and engaging frequently and often. Role of Cymbalta discussed.      Sun-damaged skin    Recommend yearly dermatologic exams       Other Visit Diagnoses    Encounter for gynecological examination without abnormal finding    -  Primary   Needs colonoscopy, mammogram, and Pap smear updated   Relevant Orders   MM 3D SCREEN BREAST BILATERAL   Cytology - PAP( [place])   Ambulatory referral to Gastroenterology     Return in 1 year (on 07/25/2021).  See After Visit Summary for Counseling Recommendations

## 2020-07-26 ENCOUNTER — Encounter: Payer: Self-pay | Admitting: Gastroenterology

## 2020-07-26 ENCOUNTER — Other Ambulatory Visit: Payer: Self-pay | Admitting: Family Medicine

## 2020-07-26 DIAGNOSIS — Z1231 Encounter for screening mammogram for malignant neoplasm of breast: Secondary | ICD-10-CM

## 2020-07-27 LAB — CYTOLOGY - PAP
Comment: NEGATIVE
Diagnosis: NEGATIVE
High risk HPV: NEGATIVE

## 2020-07-28 ENCOUNTER — Ambulatory Visit
Payer: 59 | Attending: Student in an Organized Health Care Education/Training Program | Admitting: Student in an Organized Health Care Education/Training Program

## 2020-07-28 ENCOUNTER — Other Ambulatory Visit: Payer: Self-pay

## 2020-07-28 ENCOUNTER — Encounter: Payer: Self-pay | Admitting: Student in an Organized Health Care Education/Training Program

## 2020-07-28 DIAGNOSIS — M5416 Radiculopathy, lumbar region: Secondary | ICD-10-CM | POA: Diagnosis not present

## 2020-07-28 DIAGNOSIS — G894 Chronic pain syndrome: Secondary | ICD-10-CM | POA: Diagnosis present

## 2020-07-28 DIAGNOSIS — M47816 Spondylosis without myelopathy or radiculopathy, lumbar region: Secondary | ICD-10-CM | POA: Diagnosis present

## 2020-07-28 DIAGNOSIS — M5136 Other intervertebral disc degeneration, lumbar region: Secondary | ICD-10-CM | POA: Insufficient documentation

## 2020-07-28 MED ORDER — HYDROCODONE-ACETAMINOPHEN 5-325 MG PO TABS: 1.0000 | ORAL_TABLET | Freq: Every day | ORAL | 0 refills | Status: DC | PRN

## 2020-07-28 MED ORDER — DULOXETINE HCL 20 MG PO CPEP: 40.0000 mg | ORAL_CAPSULE | Freq: Every day | ORAL | 3 refills | Status: DC

## 2020-07-28 MED ORDER — GABAPENTIN 300 MG PO CAPS: 300.0000 mg | ORAL_CAPSULE | Freq: Two times a day (BID) | ORAL | 3 refills | Status: DC

## 2020-07-28 NOTE — Progress Notes (Signed)
Nursing Pain Medication Assessment:  Safety precautions to be maintained throughout the outpatient stay will include: orient to surroundings, keep bed in low position, maintain call bell within reach at all times, provide assistance with transfer out of bed and ambulation.  Medication Inspection Compliance: Pill count conducted under aseptic conditions, in front of the patient. Neither the pills nor the bottle was removed from the patient's sight at any time. Once count was completed pills were immediately returned to the patient in their original bottle.  Medication: Hydrocodone/APAP Pill/Patch Count: 11 of 90 pills remain Pill/Patch Appearance: Markings consistent with prescribed medication Bottle Appearance: Standard pharmacy container. Clearly labeled. Filled Date: 00 / 00 / 2021 Last Medication intake:  Yesterday

## 2020-07-28 NOTE — Progress Notes (Signed)
PROVIDER NOTE: Information contained herein reflects review and annotations entered in association with encounter. Interpretation of such information and data should be left to medically-trained personnel. Information provided to patient can be located elsewhere in the medical record under "Patient Instructions". Document created using STT-dictation technology, any transcriptional errors that may result from process are unintentional.    Patient: [name]  Service Category: E/M  Provider: Gillis Santa, MD  DOB: Aug 03, 1961  DOS: 07/28/2020  Specialty: Interventional Pain Management  MRN: [id number]  Setting: Ambulatory outpatient  PCP: Jodelle Green, FNP  Type: Established Patient    Referring Provider: Jodelle Green, FNP  Location: Office  Delivery: Face-to-face     HPI  Reason for encounter: [name], a 59 y.o. year old female, is here today for evaluation and management of her No primary diagnosis found.. [name] primary complain today is Back Pain (low) and Pain (all joints) Last encounter: Practice (07/14/2020). My last encounter with her was on 07/14/2020. Pertinent problems: Ms. Mccarrell has Obesity; Arthritis; Chronic pain syndrome; Lumbar spondylosis; Bilateral primary osteoarthritis of knee; Bilateral hip pain; Chronic SI joint pain; Lumbar radiculopathy; and Long-term current use of opiate analgesic on their pertinent problem list. Pain Assessment: Severity of   is reported as a 6 /10. Location: Back Lower/all of her joints hurt. Onset: More than a month ago. Quality: Stabbing, Constant, Aching. Timing: Constant. Modifying factor(s): epsom salt bath, heat. Vitals:  height is 5' 7.75" (1.721 m) and weight is 219 lb (99.3 kg). Her temporal temperature is 98.1 F (36.7 C). Her blood pressure is 142/100 (abnormal) and her pulse is 93. Her respiration is 18 and oxygen saturation is 98%.   Low back pain: Related to lumbar spondylosis, lumbar degenerative disc disease.  Patient  is also having right low back pain that radiates into her posterior lateral thigh related to lumbar radiculopathy.  Her previous lumbar MRI was denied.  An order was placed at the last visit for a repeat one however the patient has not got this done.  We will follow up with [place] to see what the delay/hold-up was.  Denies any bowel or bladder issues.  In terms of medication management, is finding benefit with Cymbalta although she is having side effects of decreased libido.  Furthermore, she is utilizing gabapentin as prescribed and hydrocodone very seldomly for breakthrough pain although she has been utilizing it daily given increased pain.  She continues to endorse bilateral knee pain.  Pharmacotherapy Assessment   Analgesic: 06/06/2020  1   05/18/2020  Hydrocodone-Acetamin 5-325 MG  90.00  60 Bi Lat   [id number]   Nor (2541)   0/0  7.50 MME  Comm Ins   [place]     Monitoring: [place] PMP: PDMP reviewed during this encounter.       Pharmacotherapy: No side-effects or adverse reactions reported. Compliance: No problems identified. Effectiveness: Clinically acceptable.  Hart Rochester, RN  07/28/2020  8:24 AM  Sign when Signing Visit Nursing Pain Medication Assessment:  Safety precautions to be maintained throughout the outpatient stay will include: orient to surroundings, keep bed in low position, maintain call bell within reach at all times, provide assistance with transfer out of bed and ambulation.  Medication Inspection Compliance: Pill count conducted under aseptic conditions, in front of the patient. Neither the pills nor the bottle was removed from the patient's sight at any time. Once count was completed pills were immediately returned to the patient in their original bottle.  Medication: Hydrocodone/APAP Pill/Patch Count: 11 of 90 pills remain Pill/Patch Appearance: Markings consistent with prescribed medication Bottle Appearance: Standard pharmacy container. Clearly labeled. Filled Date: 00  / 00 / 2021 Last Medication intake:  Cletus Gash, RN  07/28/2020  8:10 AM  Sign when Signing Visit Safety precautions to be maintained throughout the outpatient stay will include: orient to surroundings, keep bed in low position, maintain call bell within reach at all times, provide assistance with transfer out of bed and ambulation.     UDS:  Summary  Date Value Ref Range Status  05/18/2020 Note  Final    Comment:    ==================================================================== [facility] 13 (MW) ==================================================================== Test                             Result       Flag       Units Drug Present and Declared for Prescription Verification   Hydrocodone                    223          EXPECTED   ng/mg creat   Norhydrocodone                 487          EXPECTED   ng/mg creat    Sources of hydrocodone include scheduled prescription medications.    Norhydrocodone is an expected metabolite of hydrocodone. ==================================================================== Test                      Result    Flag   Units      Ref Range   Creatinine              31               mg/dL      >=20 ==================================================================== Declared Medications:  The flagging and interpretation on this report are based on the  following declared medications.  Unexpected results may arise from  inaccuracies in the declared medications.  **Note: The testing scope of this panel includes these medications:  Hydrocodone (Norco)  **Note: The testing scope of this panel does not include the  following reported medications:  Acetaminophen (Norco)  Azithromycin (Zithromax)  Duloxetine (Cymbalta)  Gabapentin (Neurontin)  Melatonin  Naproxen (Aleve)  Omeprazole (Prilosec)  Ondansetron (Zofran)  Topical  Turmeric  Valacyclovir  (Valtrex) ==================================================================== For clinical consultation, please call [phone]. ====================================================================      ROS  Constitutional: Denies any fever or chills Gastrointestinal: No reported hemesis, hematochezia, vomiting, or acute GI distress Musculoskeletal: Low back pain that radiates into right hip and posterior lateral thigh, bilateral knee pain Neurological: No reported episodes of acute onset apraxia, aphasia, dysarthria, agnosia, amnesia, paralysis, loss of coordination, or loss of consciousness  Medication Review  DULoxetine, HYDROcodone-acetaminophen, Melatonin, albuterol, gabapentin, multivitamin, omeprazole, and valACYclovir  History Review  Allergy: Ms. Bocchino has No Known Allergies. Drug: Ms. Pebley  reports no history of drug use. Alcohol:  reports previous alcohol use. Tobacco:  reports that she quit smoking about 17 years ago. Her smoking use included cigarettes. She has never used smokeless tobacco. Social: Ms. Farfan  reports that she quit smoking about 17 years ago. Her smoking use included cigarettes. She has never used smokeless tobacco. She reports previous alcohol use. She reports that she does not use drugs. Medical:  has a past  medical history of Arthritis, Degenerative disorder of bone, Genuine stress incontinence, female (2012), GERD (gastroesophageal reflux disease), Migraines, and Obesity. Surgical: Ms. Nemetz  has a past surgical history that includes Laparoscopic hysterectomy (10/23/2010); Midurethral sling (05/02/2011); and excision of vulvar lesion (05/02/2011). Family: family history includes Arthritis in her maternal grandmother and mother; Cancer in her father and paternal grandfather; Diabetes in her son; Heart attack in her maternal grandfather, mother, and paternal grandmother; Hypertension in her mother.  Laboratory Chemistry Profile   Renal Lab Results   Component Value Date   BUN 8 01/30/2019   CREATININE 0.91 01/30/2019   BCR 12 06/16/2018   GFR 64.07 01/30/2019   GFRAA 92 06/16/2018   GFRNONAA 80 06/16/2018     Hepatic Lab Results  Component Value Date   AST 20 01/30/2019   ALT 26 01/30/2019   ALBUMIN 4.1 01/30/2019   ALKPHOS 91 01/30/2019     Electrolytes Lab Results  Component Value Date   NA 141 01/30/2019   K 4.2 01/30/2019   CL 106 01/30/2019   CALCIUM 10.0 01/30/2019   MG 1.8 01/30/2019     Bone Lab Results  Component Value Date   VD25OH 34.07 01/30/2019     Inflammation (CRP: Acute Phase) (ESR: Chronic Phase) No results found for: CRP, ESRSEDRATE, LATICACIDVEN     Note: Above Lab results reviewed.  Recent Imaging Review  DG Lumbar Spine Complete W/Bend CLINICAL DATA:  Low back pain.  No trauma history submitted.  EXAM: LUMBAR SPINE - COMPLETE WITH BENDING VIEWS  COMPARISON:  12/04/2014  FINDINGS: Maintenance of vertebral body height and alignment. Mild spondylosis, with facet arthropathy at L4-5 and L5-S1. Loss of intervertebral disc height at L5-S1, progressive. Five lumbar type vertebral bodies. Sacroiliac joints are symmetric. With flexion, limited range of motion but no instability. With extension, good range of motion and no instability.  IMPRESSION: Lower lumbar spondylosis, without acute osseous finding.  Electronically Signed   By: Abigail Miyamoto M.D.   On: 05/19/2020 09:27 DG HIP UNILAT W OR W/O PELVIS 2-3 VIEWS LEFT CLINICAL DATA:  Hip pain  EXAM: DG HIP (WITH OR WITHOUT PELVIS) 2-3V LEFT  COMPARISON:  None.  FINDINGS: There is no evidence of hip fracture or dislocation. There is no evidence of arthropathy or other focal bone abnormality.  IMPRESSION: Negative.  Electronically Signed   By: Misty Stanley M.D.   On: 05/19/2020 09:26 DG HIP UNILAT W OR W/O PELVIS 2-3 VIEWS RIGHT CLINICAL DATA:  Right hip pain.  EXAM: DG HIP (WITH OR WITHOUT PELVIS) 2-3V  RIGHT  COMPARISON:  None.  FINDINGS: There is no evidence of hip fracture or dislocation. There is no evidence of arthropathy or other focal bone abnormality.  IMPRESSION: Negative.  Electronically Signed   By: Misty Stanley M.D.   On: 05/19/2020 09:25 Note: Reviewed        Physical Exam  General appearance: Well nourished, well developed, and well hydrated. In no apparent acute distress Mental status: Alert, oriented x 3 (person, place, & time)       Respiratory: No evidence of acute respiratory distress Eyes: PERLA Vitals: BP (!) 142/100    Pulse 93    Temp 98.1 F (36.7 C) (Temporal)    Resp 18    Ht 5' 7.75" (1.721 m)    Wt 219 lb (99.3 kg)    SpO2 98%    BMI 33.55 kg/m  BMI: Estimated body mass index is 33.55 kg/m as calculated from the following:  Height as of this encounter: 5' 7.75" (1.721 m).   Weight as of this encounter: 219 lb (99.3 kg). Ideal: Ideal body weight: 63.3 kg (139 lb 9.7 oz) Adjusted ideal body weight: 77.7 kg (171 lb 5.8 oz)   Lumbar Spine Area Exam  Skin & Axial Inspection: No masses, redness, or swelling Alignment: Symmetrical Functional ROM: Pain restricted ROM right greater than left       Stability: No instability detected Muscle Tone/Strength: Functionally intact. No obvious neuro-muscular anomalies detected. Sensory (Neurological): Dermatomal pain pattern on the right Palpation: No palpable anomalies       Provocative Tests: Hyperextension/rotation test: (+) bilaterally for facet joint pain. Lumbar quadrant test (Kemp's test): (+) on the right for foraminal stenosis  *(Flexion, ABduction and External Rotation) Gait & Posture Assessment  Ambulation: Unassisted Gait: Relatively normal for age and body habitus Posture: WNL  Lower Extremity Exam    Side: Right lower extremity  Side: Left lower extremity  Stability: No instability observed          Stability: No instability observed          Skin & Extremity Inspection: Skin color,  temperature, and hair growth are WNL. No peripheral edema or cyanosis. No masses, redness, swelling, asymmetry, or associated skin lesions. No contractures.  Skin & Extremity Inspection: Skin color, temperature, and hair growth are WNL. No peripheral edema or cyanosis. No masses, redness, swelling, asymmetry, or associated skin lesions. No contractures.  Functional ROM: Pain restricted ROM for hip and knee joints Limited SLR (straight leg raise)  Functional ROM: Pain restricted ROM for hip and knee joints          Muscle Tone/Strength: Functionally intact. No obvious neuro-muscular anomalies detected.  Muscle Tone/Strength: Functionally intact. No obvious neuro-muscular anomalies detected.  Sensory (Neurological): Unimpaired        Sensory (Neurological): Unimpaired        DTR: Patellar: deferred today Achilles: deferred today Plantar: deferred today  DTR: Patellar: deferred today Achilles: deferred today Plantar: deferred today  Palpation: No palpable anomalies  Palpation: No palpable anomalies    Assessment   Status Diagnosis  Persistent Having a Flare-up Persistent 1. Chronic pain syndrome   2. Lumbar spondylosis   3. Lumbar radiculopathy   4. Other intervertebral disc degeneration, lumbar region      Updated Problems: Problem  Lumbar Radiculopathy  Bilateral Primary Osteoarthritis of Knee  Bilateral Hip Pain  Chronic Si Joint Pain  Chronic Pain Syndrome  Lumbar Spondylosis  Long-Term Current Use of Opiate Analgesic  Obesity  Arthritis   Bone degeneration--to see rheumatology     Plan of Care  [name] has a current medication list which includes the following long-term medication(s): albuterol, duloxetine, gabapentin, and omeprazole.  1. Chronic pain syndrome - DULoxetine (CYMBALTA) 20 MG capsule; Take 2 capsules (40 mg total) by mouth daily.  Dispense: 60 capsule; Refill: 3 - HYDROcodone-acetaminophen (NORCO/VICODIN) 5-325 MG tablet; Take 1-2 tablets  by mouth daily as needed for severe pain. Must last 60 days.  Dispense: 90 tablet; Refill: 0 - gabapentin (NEURONTIN) 300 MG capsule; Take 1 capsule (300 mg total) by mouth 2 (two) times daily.  Dispense: 60 capsule; Refill: 3 - MR LUMBAR SPINE WO CONTRAST; Future  2. Lumbar spondylosis -Consider diagnostic lumbar facet medial branch nerve blocks in future - MR LUMBAR SPINE WO CONTRAST; Future  3. Lumbar radiculopathy -Clinical exam and findings consistent with right L4 radiculopathy, MRI as below to evaluate in greater detail, diagnostic  lumbar ESI as below - HYDROcodone-acetaminophen (NORCO/VICODIN) 5-325 MG tablet; Take 1-2 tablets by mouth daily as needed for severe pain. Must last 60 days.  Dispense: 90 tablet; Refill: 0 - Lumbar Epidural Injection; Future - MR LUMBAR SPINE WO CONTRAST; Future   Pharmacotherapy (Medications Ordered): Meds ordered this encounter  Medications   DULoxetine (CYMBALTA) 20 MG capsule    Sig: Take 2 capsules (40 mg total) by mouth daily.    Dispense:  60 capsule    Refill:  3   HYDROcodone-acetaminophen (NORCO/VICODIN) 5-325 MG tablet    Sig: Take 1-2 tablets by mouth daily as needed for severe pain. Must last 60 days.    Dispense:  90 tablet    Refill:  0    [place] STOP ACT - Not applicable. Fill one day early if pharmacy is closed on scheduled refill date.   gabapentin (NEURONTIN) 300 MG capsule    Sig: Take 1 capsule (300 mg total) by mouth 2 (two) times daily.    Dispense:  60 capsule    Refill:  3   Orders:  Orders Placed This Encounter  Procedures   Lumbar Epidural Injection    Standing Status:   Future    Standing Expiration Date:   08/28/2020    Scheduling Instructions:     Procedure: Interlaminar Lumbar Epidural Steroid injection (LESI) RIGHT L4/5           Laterality: Midline     Sedation: PO Valium     Timeframe: ASAA    Order Specific Question:   Where will this procedure be performed?    Answer:   [facility]   MR  LUMBAR SPINE WO CONTRAST    Patient presents with axial pain with possible radicular component.  In addition to any acute findings, please report on:  1. Facet (Zygapophyseal) joint DJD (Hypertrophy, space narrowing, subchondral sclerosis, and/or osteophyte formation) 2. DDD and/or IVDD (Loss of disc height, desiccation or "Black disc disease") 3. Pars defects 4. Spondylolisthesis, spondylosis, and/or spondyloarthropathies (include Degree/Grade of displacement in mm) 5. Vertebral body Fractures, including age (old, new/acute) 54. Modic Type Changes 7. Demineralization 8. Bone pathology 9. Central, Lateral Recess, and/or Foraminal Stenosis (include AP diameter of stenosis in mm) 10. Surgical changes (hardware type, status, and presence of fibrosis)  NOTE: Please specify level(s) and laterality.    Standing Status:   Future    Standing Expiration Date:   10/28/2020    Order Specific Question:   What is the patient's sedation requirement?    Answer:   No Sedation    Order Specific Question:   Does the patient have a pacemaker or implanted devices?    Answer:   No    Order Specific Question:   Preferred imaging location?    Answer:   [facility] (table limit-350lbs)    Order Specific Question:   Call Results- Best Contact Number?    Answer:   [phone] ([facility])    Order Specific Question:   Radiology Contrast Protocol - do NOT remove file path    Answer:   [email or web address]   Follow-up plan:   Return in about 1 week (around 08/04/2020) for Right L4 ESI (f/u L-MRI) PO Valium 5.     s/p IA hyalgan #1 on 08/19/2019, #2 09/23/2019 not very effective unfortunately        Recent Visits Date Type Provider Dept  05/18/20 Office Visit Gillis Santa, MD [place] recent visits within past 90 days  and meeting all other requirements Today's Visits Date Type Provider Dept  07/28/20 Office Visit Gillis Santa, MD [facility]  Showing today's visits and meeting all other requirements Future Appointments No visits were found meeting these conditions. Showing future appointments within next 90 days and meeting all other requirements  I discussed the assessment and treatment plan with the patient. The patient was provided an opportunity to ask questions and all were answered. The patient agreed with the plan and demonstrated an understanding of the instructions.  Patient advised to call back or seek an in-person evaluation if the symptoms or condition worsens.  Duration of encounter: 35 minutes.  Note by: Gillis Santa, MD Date: 07/28/2020; Time: 9:08 AM

## 2020-07-28 NOTE — Progress Notes (Signed)
Safety precautions to be maintained throughout the outpatient stay will include: orient to surroundings, keep bed in low position, maintain call bell within reach at all times, provide assistance with transfer out of bed and ambulation.  

## 2020-07-28 NOTE — Patient Instructions (Addendum)
____________________________________________________________________________________________  Preparing for Procedure with Sedation  Procedure appointments are limited to planned procedures: . No Prescription Refills. . No disability issues will be discussed. . No medication changes will be discussed.  Instructions: . Oral Intake: Do not eat or drink anything for at least 8 hours prior to your procedure. (Exception: Blood Pressure Medication. See below.) . Transportation: Unless otherwise stated by your physician, you may drive yourself after the procedure. . Blood Pressure Medicine: Do not forget to take your blood pressure medicine with a sip of water the morning of the procedure. If your Diastolic (lower reading)is above 100 mmHg, elective cases will be cancelled/rescheduled. . Blood thinners: These will need to be stopped for procedures. Notify our staff if you are taking any blood thinners. Depending on which one you take, there will be specific instructions on how and when to stop it. . Diabetics on insulin: Notify the staff so that you can be scheduled 1st case in the morning. If your diabetes requires high dose insulin, take only  of your normal insulin dose the morning of the procedure and notify the staff that you have done so. . Preventing infections: Shower with an antibacterial soap the morning of your procedure. . Build-up your immune system: Take 1000 mg of Vitamin C with every meal (3 times a day) the day prior to your procedure. . Antibiotics: Inform the staff if you have a condition or reason that requires you to take antibiotics before dental procedures. . Pregnancy: If you are pregnant, call and cancel the procedure. . Sickness: If you have a cold, fever, or any active infections, call and cancel the procedure. . Arrival: You must be in the facility at least 30 minutes prior to your scheduled procedure. . Children: Do not bring children with you. . Dress appropriately:  Bring dark clothing that you would not mind if they get stained. . Valuables: Do not bring any jewelry or valuables.  Reasons to call and reschedule or cancel your procedure: (Following these recommendations will minimize the risk of a serious complication.) . Surgeries: Avoid having procedures within 2 weeks of any surgery. (Avoid for 2 weeks before or after any surgery). . Flu Shots: Avoid having procedures within 2 weeks of a flu shots or . (Avoid for 2 weeks before or after immunizations). . Barium: Avoid having a procedure within 7-10 days after having had a radiological study involving the use of radiological contrast. (Myelograms, Barium swallow or enema study). . Heart attacks: Avoid any elective procedures or surgeries for the initial 6 months after a "Myocardial Infarction" (Heart Attack). . Blood thinners: It is imperative that you stop these medications before procedures. Let us know if you if you take any blood thinner.  . Infection: Avoid procedures during or within two weeks of an infection (including chest colds or gastrointestinal problems). Symptoms associated with infections include: Localized redness, fever, chills, night sweats or profuse sweating, burning sensation when voiding, cough, congestion, stuffiness, runny nose, sore throat, diarrhea, nausea, vomiting, cold or Flu symptoms, recent or current infections. It is specially important if the infection is over the area that we intend to treat. . Heart and lung problems: Symptoms that may suggest an active cardiopulmonary problem include: cough, chest pain, breathing difficulties or shortness of breath, dizziness, ankle swelling, uncontrolled high or unusually low blood pressure, and/or palpitations. If you are experiencing any of these symptoms, cancel your procedure and contact your primary care physician for an evaluation.  Remember:  Regular Business hours are:    Monday to Thursday 8:00 AM to 4:00 PM  Provider's  Schedule: Milinda Pointer, MD:  Procedure days: Tuesday and Thursday 7:30 AM to 4:00 PM  Gillis Santa, MD:  Procedure days: Monday and Wednesday 7:30 AM to 4:00 PM ____________________________________________________________________________________________   Hand

## 2020-08-01 ENCOUNTER — Telehealth: Payer: Self-pay | Admitting: *Deleted

## 2020-08-03 ENCOUNTER — Other Ambulatory Visit: Payer: Self-pay | Admitting: *Deleted

## 2020-08-03 ENCOUNTER — Telehealth: Payer: Self-pay | Admitting: Student in an Organized Health Care Education/Training Program

## 2020-08-03 MED ORDER — DIAZEPAM 5 MG PO TABS: 5.0000 mg | ORAL_TABLET | Freq: Once | ORAL | 0 refills | Status: DC | PRN

## 2020-08-03 NOTE — Telephone Encounter (Signed)
Called to let patient know, voicemail left.

## 2020-08-03 NOTE — Telephone Encounter (Signed)
Pt called and stated that she is going to have an MRI and that Dr Holley Raring told her he would rx valium for the MRI. Please advise.

## 2020-08-03 NOTE — Addendum Note (Signed)
Addended by: Gillis Santa on: 08/03/2020 02:17 PM   Modules accepted: Orders

## 2020-08-03 NOTE — Telephone Encounter (Signed)
Called patient to check pharmacy and let her know that I have sent the message to DR [facility] about valium for MRI that is scheduled for 08/18/20.

## 2020-08-18 ENCOUNTER — Other Ambulatory Visit: Payer: Self-pay

## 2020-08-18 ENCOUNTER — Ambulatory Visit
Admission: RE | Admit: 2020-08-18 | Discharge: 2020-08-18 | Disposition: A | Discharge status: Home / Self Care | Payer: 59 | Source: Ambulatory Visit | Attending: Student in an Organized Health Care Education/Training Program | Admitting: Student in an Organized Health Care Education/Training Program

## 2020-08-18 DIAGNOSIS — M5416 Radiculopathy, lumbar region: Secondary | ICD-10-CM | POA: Insufficient documentation

## 2020-08-18 DIAGNOSIS — M5136 Other intervertebral disc degeneration, lumbar region: Secondary | ICD-10-CM | POA: Insufficient documentation

## 2020-08-18 DIAGNOSIS — M47816 Spondylosis without myelopathy or radiculopathy, lumbar region: Secondary | ICD-10-CM | POA: Insufficient documentation

## 2020-08-18 DIAGNOSIS — G894 Chronic pain syndrome: Secondary | ICD-10-CM | POA: Diagnosis present

## 2020-08-24 ENCOUNTER — Ambulatory Visit
Payer: 59 | Attending: Student in an Organized Health Care Education/Training Program | Admitting: Student in an Organized Health Care Education/Training Program

## 2020-08-24 ENCOUNTER — Other Ambulatory Visit: Payer: Self-pay

## 2020-08-24 ENCOUNTER — Encounter: Payer: Self-pay | Admitting: Student in an Organized Health Care Education/Training Program

## 2020-08-24 VITALS — BP 147/90 | HR 71 | Temp 98.1°F | Resp 18 | Ht 67.75 in | Wt 219.0 lb

## 2020-08-24 DIAGNOSIS — M47816 Spondylosis without myelopathy or radiculopathy, lumbar region: Secondary | ICD-10-CM | POA: Insufficient documentation

## 2020-08-24 DIAGNOSIS — G894 Chronic pain syndrome: Secondary | ICD-10-CM | POA: Insufficient documentation

## 2020-08-24 DIAGNOSIS — M5416 Radiculopathy, lumbar region: Secondary | ICD-10-CM | POA: Diagnosis present

## 2020-08-24 MED ORDER — METHYLPREDNISOLONE 4 MG PO TBPK: ORAL_TABLET | ORAL | 0 refills | Status: AC

## 2020-08-24 MED ORDER — ORPHENADRINE CITRATE 30 MG/ML IJ SOLN
INTRAMUSCULAR | Status: AC
  Filled 2020-08-24: qty 2

## 2020-08-24 MED ORDER — KETOROLAC TROMETHAMINE 30 MG/ML IJ SOLN
30.0000 mg | Freq: Once | INTRAMUSCULAR | Status: AC
  Administered 2020-08-24: 30 mg via INTRAMUSCULAR

## 2020-08-24 MED ORDER — ORPHENADRINE CITRATE 30 MG/ML IJ SOLN
30.0000 mg | Freq: Once | INTRAMUSCULAR | Status: AC
  Administered 2020-08-24: 60 mg via INTRAMUSCULAR

## 2020-08-24 MED ORDER — KETOROLAC TROMETHAMINE 30 MG/ML IJ SOLN
INTRAMUSCULAR | Status: AC
  Filled 2020-08-24: qty 1

## 2020-08-24 NOTE — Progress Notes (Signed)
PROVIDER NOTE: Information contained herein reflects review and annotations entered in association with encounter. Interpretation of such information and data should be left to medically-trained personnel. Information provided to patient can be located elsewhere in the medical record under "Patient Instructions". Document created using STT-dictation technology, any transcriptional errors that may result from process are unintentional.    Patient: [name]  Service Category: E/M  Provider: Gillis Santa, MD  DOB: 1960/12/18  DOS: 08/24/2020  Specialty: Interventional Pain Management  MRN: [id number]  Setting: Ambulatory outpatient  PCP: Jodelle Green, FNP  Type: Established Patient    Referring Provider: Gillis Santa, MD  Location: Office  Delivery: Face-to-face     HPI  Reason for encounter: [name], a 59 y.o. year old female, is here today for evaluation and management of her Lumbar spondylosis [M47.816]. Ms. Tauer primary complain today is Back Pain (low) and Hip Pain (right) Last encounter: Practice (08/03/2020). My last encounter with her was on 08/03/2020. Pertinent problems: [name] has Obesity; Arthritis; Chronic pain syndrome; Lumbar facet arthropathy; Bilateral primary osteoarthritis of knee; Bilateral hip pain; Chronic SI joint pain; Lumbar radiculopathy; and Long-term current use of opiate analgesic on their pertinent problem list. Pain Assessment: Severity of Chronic pain is reported as a 9 /10. Location: Back Lower/right hip. Onset: More than a month ago. Quality: Dull, Aching, Constant. Timing: Constant. Modifying factor(s): hot baths, heat. Vitals:  height is 5' 7.75" (1.721 m) and weight is 219 lb (99.3 kg). Her oral temperature is 98.1 F (36.7 C). Her blood pressure is 147/90 (abnormal) and her pulse is 71. Her respiration is 18 and oxygen saturation is 98%.   Patient has completed her lumbar MRI.  We reviewed the results as below.  Patient's lumbar MRI shows  mild facet arthropathy at L3-L4 and L4-L5.  She does have predominantly axial low back pain that is worse with facet loading as documented on physical exam below.  Of note, she has had therapeutic facet blocks in the past with [facility], Dr. Nelva Bush that did provide low back pain relief, greater than 50% for a couple of months.  She states that is been over 2 years since she has had any injections in her low back.  We also discussed Sprint peripheral nerve stimulation of lumbar medial branch as a potential therapeutic option.   ROS  Constitutional: Denies any fever or chills Gastrointestinal: No reported hemesis, hematochezia, vomiting, or acute GI distress Musculoskeletal: Axial low back pain with radiation into right upper buttock Neurological: No reported episodes of acute onset apraxia, aphasia, dysarthria, agnosia, amnesia, paralysis, loss of coordination, or loss of consciousness  Medication Review  DULoxetine, HYDROcodone-acetaminophen, Melatonin, albuterol, diazepam, gabapentin, methylPREDNISolone, multivitamin, omeprazole, and valACYclovir  History Review  Allergy: [name] has No Known Allergies. Drug: [name]  reports no history of drug use. Alcohol:  reports previous alcohol use. Tobacco:  reports that she quit smoking about 17 years ago. Her smoking use included cigarettes. She has never used smokeless tobacco. Social: [name]  reports that she quit smoking about 17 years ago. Her smoking use included cigarettes. She has never used smokeless tobacco. She reports previous alcohol use. She reports that she does not use drugs. Medical:  has a past medical history of Arthritis, Degenerative disorder of bone, Genuine stress incontinence, female (2012), GERD (gastroesophageal reflux disease), Migraines, and Obesity. Surgical: [name]  has a past surgical history that includes Laparoscopic hysterectomy (10/23/2010); Midurethral sling (05/02/2011); and excision of vulvar lesion  (  05/02/2011). Family: family history includes Arthritis in her maternal grandmother and mother; Cancer in her father and paternal grandfather; Diabetes in her son; Heart attack in her maternal grandfather, mother, and paternal grandmother; Hypertension in her mother.  Laboratory Chemistry Profile   Renal Lab Results  Component Value Date   BUN 8 01/30/2019   CREATININE 0.91 01/30/2019   BCR 12 06/16/2018   GFR 64.07 01/30/2019   GFRAA 92 06/16/2018   GFRNONAA 80 06/16/2018     Hepatic Lab Results  Component Value Date   AST 20 01/30/2019   ALT 26 01/30/2019   ALBUMIN 4.1 01/30/2019   ALKPHOS 91 01/30/2019     Electrolytes Lab Results  Component Value Date   NA 141 01/30/2019   K 4.2 01/30/2019   CL 106 01/30/2019   CALCIUM 10.0 01/30/2019   MG 1.8 01/30/2019     Bone Lab Results  Component Value Date   VD25OH 34.07 01/30/2019     Inflammation (CRP: Acute Phase) (ESR: Chronic Phase) No results found for: CRP, ESRSEDRATE, LATICACIDVEN     Note: Above Lab results reviewed.  Recent Imaging Review  MR LUMBAR SPINE WO CONTRAST CLINICAL DATA:  Five years of progressively worsening central and right lower back pain. Pain radiates occasionally into the right thigh.  EXAM: MRI LUMBAR SPINE WITHOUT CONTRAST  TECHNIQUE: Multiplanar, multisequence MR imaging of the lumbar spine was performed. No intravenous contrast was administered.  COMPARISON:  None.  FINDINGS: Segmentation:  5 lumbar type vertebrae  Alignment:  Exaggerated lumbar lordosis  Vertebrae:  No fracture, evidence of discitis, or bone lesion.  Conus medullaris and cauda equina: Conus extends to the L2 level. Conus and cauda equina appear normal.  Paraspinal and other soft tissues: Negative.  Disc levels:  T12- L1: Unremarkable.  L1-L2: Disc narrowing and bulging.  Ventral spondylitic spurring.  L2-L3: Disc narrowing and ventral spondylitic spurring.  L3-L4: Disc narrowing and bulging with  mild facet spurring. No impingement  L4-L5: Degenerative facet spurring and ligamentum flavum thickening. Mild disc bulging. No impingement  L5-S1:Mild disc narrowing and bulging. Mild facet spurring. No impingement  IMPRESSION: Generalized overall mild degenerative change without neural impingement or inflammatory finding.  Electronically Signed   By: Monte Fantasia M.D.   On: 08/19/2020 05:19 Note: Reviewed       Reviewed with patient in detail using PACS system Physical Exam  General appearance: Well nourished, well developed, and well hydrated. In no apparent acute distress Mental status: Alert, oriented x 3 (person, place, & time)       Respiratory: No evidence of acute respiratory distress Eyes: PERLA Vitals: BP (!) 147/90   Pulse 71   Temp 98.1 F (36.7 C) (Oral)   Resp 18   Ht 5' 7.75" (1.721 m)   Wt 219 lb (99.3 kg)   SpO2 98%   BMI 33.55 kg/m  BMI: Estimated body mass index is 33.55 kg/m as calculated from the following:   Height as of this encounter: 5' 7.75" (1.721 m).   Weight as of this encounter: 219 lb (99.3 kg). Ideal: Ideal body weight: 63.3 kg (139 lb 9.7 oz) Adjusted ideal body weight: 77.7 kg (171 lb 5.8 oz)   Lumbar Spine Area Exam  Skin & Axial Inspection: No masses, redness, or swelling Alignment: Symmetrical Functional ROM: Pain restricted ROM right greater than left       Stability: No instability detected Muscle Tone/Strength: Functionally intact. No obvious neuro-muscular anomalies detected. Sensory (Neurological):  Articular, arthropathic Palpation: No  palpable anomalies       Provocative Tests: Hyperextension/rotation test: (+) bilaterally for facet joint pain.  *(Flexion, ABduction and External Rotation) Gait & Posture Assessment  Ambulation: Unassisted Gait: Relatively normal for age and body habitus Posture: WNL  Lower Extremity Exam    Side: Right lower extremity  Side: Left lower extremity  Stability: No instability  observed          Stability: No instability observed          Skin & Extremity Inspection: Skin color, temperature, and hair growth are WNL. No peripheral edema or cyanosis. No masses, redness, swelling, asymmetry, or associated skin lesions. No contractures.  Skin & Extremity Inspection: Skin color, temperature, and hair growth are WNL. No peripheral edema or cyanosis. No masses, redness, swelling, asymmetry, or associated skin lesions. No contractures.  Functional ROM: Pain restricted ROM for hip and knee joints Limited SLR (straight leg raise)  Functional ROM: Pain restricted ROM for hip and knee joints          Muscle Tone/Strength: Functionally intact. No obvious neuro-muscular anomalies detected.  Muscle Tone/Strength: Functionally intact. No obvious neuro-muscular anomalies detected.  Sensory (Neurological): Unimpaired        Sensory (Neurological): Unimpaired        DTR: Patellar: deferred today Achilles: deferred today Plantar: deferred today  DTR: Patellar: deferred today Achilles: deferred today Plantar: deferred today  Palpation: No palpable anomalies  Palpation: No palpable anomalies     Assessment   Status Diagnosis  Persistent Persistent Persistent 1. Lumbar spondylosis   2. Lumbar facet arthropathy   3. Lumbar facetogenic pain   4. Chronic pain syndrome      Updated Problems: Problem  Lumbar Facet Arthropathy    Plan of Care  Problem-specific:  Lumbar facet arthropathy DERRICK ORRIS has a history of greater than 3 months of moderate to severe pain which is resulted in functional impairment.  The patient has tried various conservative therapeutic options such as NSAIDs, Tylenol, muscle relaxants, physical therapy which was inadequately effective.  Patient's pain is predominantly axial with physical exam findings suggestive of facet arthropathy.  Patient's MRI also shows facet arthropathy at L3-L4 and L4-L5.  She has previously had lumbar facet medial  branch nerve blocks in the past, greater than 2 years ago, that were effective for her low back pain.  She states that these provided greater than 50% pain relief for a couple of months.  We discussed repeating.  Lumbar facet medial branch nerve blocks were discussed with the patient.  Risks and benefits were reviewed.  Patient would like to proceed with bilateral L3, L4, L5 medial branch nerve block.   Ms. JADWIGA FAIDLEY has a current medication list which includes the following long-term medication(s): albuterol, duloxetine, gabapentin, and omeprazole.  Pharmacotherapy (Medications Ordered): Meds ordered this encounter  Medications  . orphenadrine (NORFLEX) injection 30 mg  . ketorolac (TORADOL) 30 MG/ML injection 30 mg  . methylPREDNISolone (MEDROL) 4 MG TBPK tablet    Sig: Follow package instructions.    Dispense:  21 tablet    Refill:  0    Do not add to the "Automatic Refill" notification system.   Orders:  Orders Placed This Encounter  Procedures  . LUMBAR FACET(MEDIAL BRANCH NERVE BLOCK) MBNB    Standing Status:   Future    Standing Expiration Date:   09/23/2020    Scheduling Instructions:     Procedure: Lumbar facet block (AKA.: Lumbosacral medial branch nerve block)  Side: Bilateral     Level: L3-4, L4-5, Facets (L3, L4, L5,  Medial Branch Nerves)     Sedation: with     Timeframe: ASAA    Order Specific Question:   Where will this procedure be performed?    Answer:   [facility]   Follow-up plan:   Return in about 2 weeks (around 09/07/2020) for B/L L3,4,5 Facet Block with sedation.     s/p IA hyalgan #1 on 08/19/2019, #2 09/23/2019 not very effective unfortunately.  Lumbar MRI shows facet disease at L3, L4, L5 bilaterally.  Plan for lumbar facet medial branch nerve blocks.        Recent Visits Date Type Provider Dept  07/28/20 Office Visit Gillis Santa, MD [facility]  Showing recent visits within past 90 days and meeting all other  requirements Today's Visits Date Type Provider Dept  08/24/20 Procedure visit Gillis Santa, MD [facility]  Showing today's visits and meeting all other requirements Future Appointments Date Type Provider Dept  09/26/20 Appointment Gillis Santa, MD [facility]  Showing future appointments within next 90 days and meeting all other requirements  I discussed the assessment and treatment plan with the patient. The patient was provided an opportunity to ask questions and all were answered. The patient agreed with the plan and demonstrated an understanding of the instructions.  Patient advised to call back or seek an in-person evaluation if the symptoms or condition worsens.  Duration of encounter: 25 minutes.  Note by: Gillis Santa, MD Date: 08/24/2020; Time: 2:10 PM

## 2020-08-24 NOTE — Progress Notes (Signed)
Safety precautions to be maintained throughout the outpatient stay will include: orient to surroundings, keep bed in low position, maintain call bell within reach at all times, provide assistance with transfer out of bed and ambulation.  

## 2020-08-24 NOTE — Patient Instructions (Signed)
A prescription for Medrol Dosepack was sent to your pharmacy. ____________________________________________________________________________________________  Preparing for Procedure with Sedation  Procedure appointments are limited to planned procedures: . No Prescription Refills. . No disability issues will be discussed. . No medication changes will be discussed.  Instructions: . Oral Intake: Do not eat or drink anything for at least 8 hours prior to your procedure. (Exception: Blood Pressure Medication. See below.) . Transportation: Unless otherwise stated by your physician, you may drive yourself after the procedure. . Blood Pressure Medicine: Do not forget to take your blood pressure medicine with a sip of water the morning of the procedure. If your Diastolic (lower reading)is above 100 mmHg, elective cases will be cancelled/rescheduled. . Blood thinners: These will need to be stopped for procedures. Notify our staff if you are taking any blood thinners. Depending on which one you take, there will be specific instructions on how and when to stop it. . Diabetics on insulin: Notify the staff so that you can be scheduled 1st case in the morning. If your diabetes requires high dose insulin, take only  of your normal insulin dose the morning of the procedure and notify the staff that you have done so. . Preventing infections: Shower with an antibacterial soap the morning of your procedure. . Build-up your immune system: Take 1000 mg of Vitamin C with every meal (3 times a day) the day prior to your procedure. Marland Kitchen Antibiotics: Inform the staff if you have a condition or reason that requires you to take antibiotics before dental procedures. . Pregnancy: If you are pregnant, call and cancel the procedure. . Sickness: If you have a cold, fever, or any active infections, call and cancel the procedure. . Arrival: You must be in the facility at least 30 minutes prior to your scheduled  procedure. . Children: Do not bring children with you. . Dress appropriately: Bring dark clothing that you would not mind if they get stained. . Valuables: Do not bring any jewelry or valuables.  Reasons to call and reschedule or cancel your procedure: (Following these recommendations will minimize the risk of a serious complication.) . Surgeries: Avoid having procedures within 2 weeks of any surgery. (Avoid for 2 weeks before or after any surgery). . Flu Shots: Avoid having procedures within 2 weeks of a flu shots or . (Avoid for 2 weeks before or after immunizations). . Barium: Avoid having a procedure within 7-10 days after having had a radiological study involving the use of radiological contrast. (Myelograms, Barium swallow or enema study). . Heart attacks: Avoid any elective procedures or surgeries for the initial 6 months after a "Myocardial Infarction" (Heart Attack). . Blood thinners: It is imperative that you stop these medications before procedures. Let us know if you if you take any blood thinner.  . Infection: Avoid procedures during or within two weeks of an infection (including chest colds or gastrointestinal problems). Symptoms associated with infections include: Localized redness, fever, chills, night sweats or profuse sweating, burning sensation when voiding, cough, congestion, stuffiness, runny nose, sore throat, diarrhea, nausea, vomiting, cold or Flu symptoms, recent or current infections. It is specially important if the infection is over the area that we intend to treat. Marland Kitchen Heart and lung problems: Symptoms that may suggest an active cardiopulmonary problem include: cough, chest pain, breathing difficulties or shortness of breath, dizziness, ankle swelling, uncontrolled high or unusually low blood pressure, and/or palpitations. If you are experiencing any of these symptoms, cancel your procedure and contact your primary care physician  for an evaluation.  Remember:  Regular  Business hours are:  Monday to Thursday 8:00 AM to 4:00 PM  Provider's Schedule: Milinda Pointer, MD:  Procedure days: Tuesday and Thursday 7:30 AM to 4:00 PM  Gillis Santa, MD:  Procedure days: Monday and Wednesday 7:30 AM to 4:00 PM ____________________________________________________________________________________________

## 2020-08-24 NOTE — Assessment & Plan Note (Signed)
[name] has a history of greater than 3 months of moderate to severe pain which is resulted in functional impairment.  The patient has tried various conservative therapeutic options such as NSAIDs, Tylenol, muscle relaxants, physical therapy which was inadequately effective.  Patient's pain is predominantly axial with physical exam findings suggestive of facet arthropathy.  Patient's MRI also shows facet arthropathy at L3-L4 and L4-L5.  She has previously had lumbar facet medial branch nerve blocks in the past, greater than 2 years ago, that were effective for her low back pain.  She states that these provided greater than 50% pain relief for a couple of months.  We discussed repeating.  Lumbar facet medial branch nerve blocks were discussed with the patient.  Risks and benefits were reviewed.  Patient would like to proceed with bilateral L3, L4, L5 medial branch nerve block.

## 2020-08-31 ENCOUNTER — Other Ambulatory Visit: Payer: Self-pay | Admitting: Internal Medicine

## 2020-08-31 DIAGNOSIS — K219 Gastro-esophageal reflux disease without esophagitis: Secondary | ICD-10-CM

## 2020-09-26 ENCOUNTER — Encounter: Payer: 59 | Admitting: Student in an Organized Health Care Education/Training Program

## 2020-09-27 ENCOUNTER — Ambulatory Visit (HOSPITAL_BASED_OUTPATIENT_CLINIC_OR_DEPARTMENT_OTHER): Payer: 59 | Admitting: Student in an Organized Health Care Education/Training Program

## 2020-09-27 ENCOUNTER — Other Ambulatory Visit: Payer: Self-pay

## 2020-09-27 ENCOUNTER — Ambulatory Visit
Admission: RE | Admit: 2020-09-27 | Discharge: 2020-09-27 | Disposition: A | Discharge status: Home / Self Care | Payer: 59 | Attending: Student in an Organized Health Care Education/Training Program | Admitting: Student in an Organized Health Care Education/Training Program

## 2020-09-27 ENCOUNTER — Ambulatory Visit
Admission: RE | Admit: 2020-09-27 | Discharge: 2020-09-27 | Disposition: A | Discharge status: Home / Self Care | Payer: 59 | Source: Ambulatory Visit | Attending: Student in an Organized Health Care Education/Training Program | Admitting: Student in an Organized Health Care Education/Training Program

## 2020-09-27 ENCOUNTER — Encounter: Payer: Self-pay | Admitting: Student in an Organized Health Care Education/Training Program

## 2020-09-27 VITALS — BP 134/95 | HR 61 | Temp 96.9°F | Resp 16 | Ht 68.0 in | Wt 200.0 lb

## 2020-09-27 DIAGNOSIS — M25511 Pain in right shoulder: Secondary | ICD-10-CM | POA: Insufficient documentation

## 2020-09-27 DIAGNOSIS — M19011 Primary osteoarthritis, right shoulder: Secondary | ICD-10-CM

## 2020-09-27 DIAGNOSIS — M5416 Radiculopathy, lumbar region: Secondary | ICD-10-CM | POA: Insufficient documentation

## 2020-09-27 DIAGNOSIS — M12811 Other specific arthropathies, not elsewhere classified, right shoulder: Secondary | ICD-10-CM | POA: Insufficient documentation

## 2020-09-27 DIAGNOSIS — G8929 Other chronic pain: Secondary | ICD-10-CM

## 2020-09-27 DIAGNOSIS — M47816 Spondylosis without myelopathy or radiculopathy, lumbar region: Secondary | ICD-10-CM | POA: Insufficient documentation

## 2020-09-27 DIAGNOSIS — G894 Chronic pain syndrome: Secondary | ICD-10-CM

## 2020-09-27 DIAGNOSIS — M170 Bilateral primary osteoarthritis of knee: Secondary | ICD-10-CM

## 2020-09-27 DIAGNOSIS — M533 Sacrococcygeal disorders, not elsewhere classified: Secondary | ICD-10-CM | POA: Insufficient documentation

## 2020-09-27 DIAGNOSIS — M75101 Unspecified rotator cuff tear or rupture of right shoulder, not specified as traumatic: Secondary | ICD-10-CM

## 2020-09-27 HISTORY — DX: Other chronic pain: G89.29

## 2020-09-27 HISTORY — DX: Other specific arthropathies, not elsewhere classified, right shoulder: M12.811

## 2020-09-27 HISTORY — DX: Primary osteoarthritis, right shoulder: M19.011

## 2020-09-27 HISTORY — DX: Other specific arthropathies, not elsewhere classified, right shoulder: M75.101

## 2020-09-27 MED ORDER — HYDROCODONE-ACETAMINOPHEN 5-325 MG PO TABS: 1.0000 | ORAL_TABLET | Freq: Four times a day (QID) | ORAL | 0 refills | Status: AC | PRN

## 2020-09-27 NOTE — Progress Notes (Signed)
PROVIDER NOTE: Information contained herein reflects review and annotations entered in association with encounter. Interpretation of such information and data should be left to medically-trained personnel. Information provided to patient can be located elsewhere in the medical record under "Patient Instructions". Document created using STT-dictation technology, any transcriptional errors that may result from process are unintentional.    Patient: [name]  Service Category: E/M  Provider: Gillis Santa, MD  DOB: 1961/05/06  DOS: 09/27/2020  Specialty: Interventional Pain Management  MRN: [id number]  Setting: Ambulatory outpatient  PCP: Marval Regal, NP  Type: Established Patient    Referring Provider: Marval Regal, NP  Location: Office  Delivery: Face-to-face     HPI  [name], a 59 y.o. year old female, is here today because of her Right rotator cuff tear arthropathy [M75.101, M12.811]. [name] primary complain today is Back Pain and Generalized Body Aches Last encounter: My last encounter with her was on 09/26/2020. Pertinent problems: [name] has Obesity; Arthritis; Chronic pain syndrome; Lumbar spondylosis; Bilateral primary osteoarthritis of knee; Bilateral hip pain; Chronic SI joint pain; Lumbar radiculopathy; and Long-term current use of opiate analgesic on their pertinent problem list. Pain Assessment: Severity of Chronic pain is reported as a 8 /10. Location: Back Lower/NEW right shoulder pain (sharp, burn). Onset:  . Quality: Aching. Timing:  . Modifying factor(s): meds (not as much as they were). Vitals:  height is 5' 8" (1.727 m) and weight is 200 lb (90.7 kg). Her temporal temperature is 96.9 F (36.1 C) (abnormal). Her blood pressure is 134/95 (abnormal) and her pulse is 61. Her respiration is 16 and oxygen saturation is 97%.   Reason for encounter: medication management.   Patient presents today for medication management.  Endorsing increased pain  of right shoulder.  No inciting or traumatic event.  Increased pain with abduction.  Localized to the anterior aspect of her shoulder.  Will obtain right shoulder x-ray and performed right diagnostic glenohumeral steroid joint injection Regards to her low back pain related to lumbar facet arthropathy, patient has upcoming diagnostic lumbar facet medial branch nerve blocks at the beginning of November.  Given increased pain, patient has been utilizing 2 tablets of hydrocodone daily.  Is requesting a dose increase in the context of increased pain.  We will increase medication to 5 mg twice daily as needed, quantity 60/month until we are able to perform right shoulder steroid injection and lumbar facet medial branch nerve block which will hopefully provide her with therapeutic pain relief so that we can wean her hydrocodone down.  Pharmacotherapy Assessment   Analgesic: Increase to hydrocodone 5 mg twice daily as needed, quantity 60/month    Monitoring: [place] PMP: PDMP reviewed during this encounter.       Pharmacotherapy: No side-effects or adverse reactions reported. Compliance: No problems identified. Effectiveness: Clinically acceptable.  [name], RN  09/27/2020  3:27 PM  Sign when Signing Visit Nursing Pain Medication Assessment:  Safety precautions to be maintained throughout the outpatient stay will include: orient to surroundings, keep bed in low position, maintain call bell within reach at all times, provide assistance with transfer out of bed and ambulation.  Medication Inspection Compliance: Pill count conducted under aseptic conditions, in front of the patient. Neither the pills nor the bottle was removed from the patient's sight at any time. Once count was completed pills were immediately returned to the patient in their original bottle.  Medication: Hydrocodone/APAP Pill/Patch Count: 0 of 90 pills remain Pill/Patch  Appearance: Markings consistent with prescribed  medication Bottle Appearance: Standard pharmacy container. Clearly labeled. Filled Date: 8 / 19 / 2021 Last Medication intake:  Yesterday    UDS:  Summary  Date Value Ref Range Status  05/18/2020 Note  Final    Comment:    ==================================================================== [facility] (MW) ==================================================================== Test                             Result       Flag       Units Drug Present and Declared for Prescription Verification   Hydrocodone                    223          EXPECTED   ng/mg creat   Norhydrocodone                 487          EXPECTED   ng/mg creat    Sources of hydrocodone include scheduled prescription medications.    Norhydrocodone is an expected metabolite of hydrocodone. ==================================================================== Test                      Result    Flag   Units      Ref Range   Creatinine              31               mg/dL      >=20 ==================================================================== Declared Medications:  The flagging and interpretation on this report are based on the  following declared medications.  Unexpected results may arise from  inaccuracies in the declared medications.  **Note: The testing scope of this panel includes these medications:  Hydrocodone (Norco)  **Note: The testing scope of this panel does not include the  following reported medications:  Acetaminophen (Norco)  Azithromycin (Zithromax)  Duloxetine (Cymbalta)  Gabapentin (Neurontin)  Melatonin  Naproxen (Aleve)  Omeprazole (Prilosec)  Ondansetron (Zofran)  Topical  Turmeric  Valacyclovir (Valtrex) ==================================================================== For clinical consultation, please call [phone]. ====================================================================      ROS  Constitutional: Denies any fever or chills Gastrointestinal: No  reported hemesis, hematochezia, vomiting, or acute GI distress Musculoskeletal: Right shoulder Pain, low back pain, bilateral hip pain, bilateral knee pain Neurological: No reported episodes of acute onset apraxia, aphasia, dysarthria, agnosia, amnesia, paralysis, loss of coordination, or loss of consciousness  Medication Review  DULoxetine, HYDROcodone-acetaminophen, Melatonin, gabapentin, multivitamin, omeprazole, and valACYclovir  History Review  Allergy: Ms. Mapel has No Known Allergies. Drug: Ms. Pretlow  reports no history of drug use. Alcohol:  reports previous alcohol use. Tobacco:  reports that she quit smoking about 17 years ago. Her smoking use included cigarettes. She has never used smokeless tobacco. Social: Ms. Shadduck  reports that she quit smoking about 17 years ago. Her smoking use included cigarettes. She has never used smokeless tobacco. She reports previous alcohol use. She reports that she does not use drugs. Medical:  has a past medical history of Arthritis, Degenerative disorder of bone, Genuine stress incontinence, female (2012), GERD (gastroesophageal reflux disease), Migraines, and Obesity. Surgical: Ms. Averitt  has a past surgical history that includes Laparoscopic hysterectomy (10/23/2010); Midurethral sling (05/02/2011); and excision of vulvar lesion (05/02/2011). Family: family history includes Arthritis in her maternal grandmother and mother; Cancer in her father and paternal grandfather; Diabetes in her son; Heart  attack in her maternal grandfather, mother, and paternal grandmother; Hypertension in her mother.  Laboratory Chemistry Profile   Renal Lab Results  Component Value Date   BUN 8 01/30/2019   CREATININE 0.91 01/30/2019   BCR 12 06/16/2018   GFR 64.07 01/30/2019   GFRAA 92 06/16/2018   GFRNONAA 80 06/16/2018     Hepatic Lab Results  Component Value Date   AST 20 01/30/2019   ALT 26 01/30/2019   ALBUMIN 4.1 01/30/2019   ALKPHOS 91 01/30/2019      Electrolytes Lab Results  Component Value Date   NA 141 01/30/2019   K 4.2 01/30/2019   CL 106 01/30/2019   CALCIUM 10.0 01/30/2019   MG 1.8 01/30/2019     Bone Lab Results  Component Value Date   VD25OH 34.07 01/30/2019     Inflammation (CRP: Acute Phase) (ESR: Chronic Phase) No results found for: CRP, ESRSEDRATE, LATICACIDVEN     Note: Above Lab results reviewed.  Recent Imaging Review  MR LUMBAR SPINE WO CONTRAST CLINICAL DATA:  Five years of progressively worsening central and right lower back pain. Pain radiates occasionally into the right thigh.  EXAM: MRI LUMBAR SPINE WITHOUT CONTRAST  TECHNIQUE: Multiplanar, multisequence MR imaging of the lumbar spine was performed. No intravenous contrast was administered.  COMPARISON:  None.  FINDINGS: Segmentation:  5 lumbar type vertebrae  Alignment:  Exaggerated lumbar lordosis  Vertebrae:  No fracture, evidence of discitis, or bone lesion.  Conus medullaris and cauda equina: Conus extends to the L2 level. Conus and cauda equina appear normal.  Paraspinal and other soft tissues: Negative.  Disc levels:  T12- L1: Unremarkable.  L1-L2: Disc narrowing and bulging.  Ventral spondylitic spurring.  L2-L3: Disc narrowing and ventral spondylitic spurring.  L3-L4: Disc narrowing and bulging with mild facet spurring. No impingement  L4-L5: Degenerative facet spurring and ligamentum flavum thickening. Mild disc bulging. No impingement  L5-S1:Mild disc narrowing and bulging. Mild facet spurring. No impingement  IMPRESSION: Generalized overall mild degenerative change without neural impingement or inflammatory finding.  Electronically Signed   By: Monte Fantasia M.D.   On: 08/19/2020 05:19 Note: Reviewed        Physical Exam  General appearance: Well nourished, well developed, and well hydrated. In no apparent acute distress Mental status: Alert, oriented x 3 (person, place, & time)       Respiratory: No  evidence of acute respiratory distress Eyes: PERLA Vitals: BP (!) 134/95   Pulse 61   Temp (!) 96.9 F (36.1 C) (Temporal)   Resp 16   Ht 5' 8" (1.727 m)   Wt 200 lb (90.7 kg)   SpO2 97%   BMI 30.41 kg/m  BMI: Estimated body mass index is 30.41 kg/m as calculated from the following:   Height as of this encounter: 5' 8" (1.727 m).   Weight as of this encounter: 200 lb (90.7 kg). Ideal: Ideal body weight: 63.9 kg (140 lb 14 oz) Adjusted ideal body weight: 74.6 kg (164 lb 8.4 oz)  Upper Extremity (UE) Exam    Side: Right upper extremity  Side: Left upper extremity  Skin & Extremity Inspection: Skin color, temperature, and hair growth are WNL. No peripheral edema or cyanosis. No masses, redness, swelling, asymmetry, or associated skin lesions. No contractures.  Skin & Extremity Inspection: Skin color, temperature, and hair growth are WNL. No peripheral edema or cyanosis. No masses, redness, swelling, asymmetry, or associated skin lesions. No contractures.  Functional ROM: Pain restricted ROM for  shoulder and elbow  Functional ROM: Unrestricted ROM          Muscle Tone/Strength: Functionally intact. No obvious neuro-muscular anomalies detected.  Muscle Tone/Strength: Functionally intact. No obvious neuro-muscular anomalies detected.  Sensory (Neurological): Arthropathic arthralgia          Sensory (Neurological): Unimpaired          Palpation: No palpable anomalies              Palpation: No palpable anomalies              Provocative Test(s):  Phalen's test: deferred Tinel's test: deferred Apley's scratch test (touch opposite shoulder):  Action 1 (Across chest): Decreased ROM Action 2 (Overhead): Decreased ROM Action 3 (LB reach): Decreased ROM   Provocative Test(s):  Phalen's test: deferred Tinel's test: deferred Apley's scratch test (touch opposite shoulder):  Action 1 (Across chest): deferred Action 2 (Overhead): deferred Action 3 (LB reach): deferred     Lumbar Spine Area  Exam  Skin & Axial Inspection: No masses, redness, or swelling Alignment: Symmetrical Functional ROM: Pain restricted ROM right greater than left       Stability: No instability detected Muscle Tone/Strength: Functionally intact. No obvious neuro-muscular anomalies detected. Sensory (Neurological): Dermatomal pain pattern on the right Palpation: No palpable anomalies       Provocative Tests: Hyperextension/rotation test: (+) bilaterally for facet joint pain. Lumbar quadrant test (Kemp's test): (+) on the right for foraminal stenosis  *(Flexion, ABduction and External Rotation) Gait & Posture Assessment  Ambulation: Unassisted Gait: Relatively normal for age and body habitus Posture: WNL  Lower Extremity Exam    Side: Right lower extremity  Side: Left lower extremity  Stability: No instability observed          Stability: No instability observed          Skin & Extremity Inspection: Skin color, temperature, and hair growth are WNL. No peripheral edema or cyanosis. No masses, redness, swelling, asymmetry, or associated skin lesions. No contractures.  Skin & Extremity Inspection: Skin color, temperature, and hair growth are WNL. No peripheral edema or cyanosis. No masses, redness, swelling, asymmetry, or associated skin lesions. No contractures.  Functional ROM: Pain restricted ROM for hip and knee joints Limited SLR (straight leg raise)  Functional ROM: Pain restricted ROM for hip and knee joints          Muscle Tone/Strength: Functionally intact. No obvious neuro-muscular anomalies detected.  Muscle Tone/Strength: Functionally intact. No obvious neuro-muscular anomalies detected.  Sensory (Neurological): Unimpaired        Sensory (Neurological): Unimpaired        DTR: Patellar: deferred today Achilles: deferred today Plantar: deferred today  DTR: Patellar: deferred today Achilles: deferred today Plantar: deferred today  Palpation: No palpable anomalies  Palpation: No  palpable anomalies     Assessment   Status Diagnosis  Having a Flare-up Having a Flare-up Having a Flare-up 1. Right rotator cuff tear arthropathy   2. Primary osteoarthritis of right shoulder   3. Chronic right shoulder pain   4. Lumbar spondylosis   5. Lumbar facet arthropathy   6. Lumbar facetogenic pain   7. Bilateral primary osteoarthritis of knee   8. Chronic SI joint pain   9. Chronic pain syndrome   10. Lumbar radiculopathy      Updated Problems: Problem  Lumbar Spondylosis  Right Rotator Cuff Tear Arthropathy  Primary Osteoarthritis of Right Shoulder  Chronic Right Shoulder Pain    Plan of Care  Ms. STORMEY WILBORN has a current medication list which includes the following long-term medication(s): duloxetine, gabapentin, and omeprazole.  Pharmacotherapy (Medications Ordered): Meds ordered this encounter  Medications  . HYDROcodone-acetaminophen (NORCO/VICODIN) 5-325 MG tablet    Sig: Take 1 tablet by mouth 4 (four) times daily as needed for severe pain.    Dispense:  120 tablet    Refill:  0    [place] STOP ACT - Not applicable. Fill one day early if pharmacy is closed on scheduled refill date.   Orders:  Orders Placed This Encounter  Procedures  . SHOULDER INJECTION    Standing Status:   Future    Standing Expiration Date:   12/28/2020    Scheduling Instructions:     Side: right     Sedation: without     Timeframe: As soon as schedule allows    Order Specific Question:   Where will this procedure be performed?    Answer:   [facility]    Comments:   Caree Wolpert  . DG Shoulder Right    Standing Status:   Future    Standing Expiration Date:   03/28/2021    Order Specific Question:   Reason for Exam (SYMPTOM  OR DIAGNOSIS REQUIRED)    Answer:   Right shoulder pain    Order Specific Question:   Is the patient pregnant?    Answer:   No    Order Specific Question:   Preferred imaging location?    Answer:   [facility]    Order Specific  Question:   Call Results- Best Contact Number?    Answer:   [phone]   Follow-up plan:   Return in about 8 weeks (around 11/22/2020) for Medication Management, in person.     s/p IA hyalgan #1 on 08/19/2019, #2 09/23/2019 not very effective unfortunately.  Lumbar MRI shows facet disease at L3, L4, L5 bilaterally.  Plan for lumbar facet medial branch nerve blocks and right anterior glenohumeral joint injection.         Recent Visits Date Type Provider Dept  09/26/20 Appointment Gillis Santa, MD [facility]  08/24/20 Procedure visit Gillis Santa, MD [facility]  07/28/20 Office Visit Gillis Santa, MD [facility]  Showing recent visits within past 90 days and meeting all other requirements Today's Visits Date Type Provider Dept  09/27/20 Office Visit Gillis Santa, MD [facility]  Showing today's visits and meeting all other requirements Future Appointments Date Type Provider Dept  10/19/20 Appointment Gillis Santa, MD [facility]  11/21/20 Appointment Gillis Santa, MD [facility]  Showing future appointments within next 90 days and meeting all other requirements  I discussed the assessment and treatment plan with the patient. The patient was provided an opportunity to ask questions and all were answered. The patient agreed with the plan and demonstrated an understanding of the instructions.  Patient advised to call back or seek an in-person evaluation if the symptoms or condition worsens.  Duration of encounter: 30 minutes.  Note by: Gillis Santa, MD Date: 09/27/2020; Time: 4:05 PM

## 2020-09-27 NOTE — Patient Instructions (Addendum)
Hydrocodone to last until 10/27/20 has been escribed to your pharmacy.  GENERAL RISKS AND COMPLICATIONS  What are the risk, side effects and possible complications? Generally speaking, most procedures are safe.  However, with any procedure there are risks, side effects, and the possibility of complications.  The risks and complications are dependent upon the sites that are lesioned, or the type of nerve block to be performed.  The closer the procedure is to the spine, the more serious the risks are.  Great care is taken when placing the radio frequency needles, block needles or lesioning probes, but sometimes complications can occur. 1. Infection: Any time there is an injection through the skin, there is a risk of infection.  This is why sterile conditions are used for these blocks.  There are four possible types of infection. 1. Localized skin infection. 2. Central Nervous System Infection-This can be in the form of Meningitis, which can be deadly. 3. Epidural Infections-This can be in the form of an epidural abscess, which can cause pressure inside of the spine, causing compression of the spinal cord with subsequent paralysis. This would require an emergency surgery to decompress, and there are no guarantees that the patient would recover from the paralysis. 4. Discitis-This is an infection of the intervertebral discs.  It occurs in about 1% of discography procedures.  It is difficult to treat and it may lead to surgery.        2. Pain: the needles have to go through skin and soft tissues, will cause soreness.       3. Damage to internal structures:  The nerves to be lesioned may be near blood vessels or    other nerves which can be potentially damaged.       4. Bleeding: Bleeding is more common if the patient is taking blood thinners such as  aspirin, Coumadin, Ticiid, Plavix, etc., or if he/she have some genetic predisposition  such as hemophilia. Bleeding into the spinal canal can cause  compression of the spinal  cord with subsequent paralysis.  This would require an emergency surgery to  decompress and there are no guarantees that the patient would recover from the  paralysis.       5. Pneumothorax:  Puncturing of a lung is a possibility, every time a needle is introduced in  the area of the chest or upper back.  Pneumothorax refers to free air around the  collapsed lung(s), inside of the thoracic cavity (chest cavity).  Another two possible  complications related to a similar event would include: Hemothorax and Chylothorax.   These are variations of the Pneumothorax, where instead of air around the collapsed  lung(s), you may have blood or chyle, respectively.       6. Spinal headaches: They may occur with any procedures in the area of the spine.       7. Persistent CSF (Cerebro-Spinal Fluid) leakage: This is a rare problem, but may occur  with prolonged intrathecal or epidural catheters either due to the formation of a fistulous  track or a dural tear.       8. Nerve damage: By working so close to the spinal cord, there is always a possibility of  nerve damage, which could be as serious as a permanent spinal cord injury with  paralysis.       9. Death:  Although rare, severe deadly allergic reactions known as "Anaphylactic  reaction" can occur to any of the medications used.  10. Worsening of the symptoms:  We can always make thing worse.  What are the chances of something like this happening? Chances of any of this occuring are extremely low.  By statistics, you have more of a chance of getting killed in a motor vehicle accident: while driving to the hospital than any of the above occurring .  Nevertheless, you should be aware that they are possibilities.  In general, it is similar to taking a shower.  Everybody knows that you can slip, hit your head and get killed.  Does that mean that you should not shower again?  Nevertheless always keep in mind that statistics do not mean  anything if you happen to be on the wrong side of them.  Even if a procedure has a 1 (one) in a 1,000,000 (million) chance of going wrong, it you happen to be that one..Also, keep in mind that by statistics, you have more of a chance of having something go wrong when taking medications.  Who should not have this procedure? If you are on a blood thinning medication (e.g. Coumadin, Plavix, see list of "Blood Thinners"), or if you have an active infection going on, you should not have the procedure.  If you are taking any blood thinners, please inform your physician.  How should I prepare for this procedure?  Do not eat or drink anything at least six hours prior to the procedure.  Bring a driver with you .  It cannot be a taxi.  Come accompanied by an adult that can drive you back, and that is strong enough to help you if your legs get weak or numb from the local anesthetic.  Take all of your medicines the morning of the procedure with just enough water to swallow them.  If you have diabetes, make sure that you are scheduled to have your procedure done first thing in the morning, whenever possible.  If you have diabetes, take only half of your insulin dose and notify our nurse that you have done so as soon as you arrive at the clinic.  If you are diabetic, but only take blood sugar pills (oral hypoglycemic), then do not take them on the morning of your procedure.  You may take them after you have had the procedure.  Do not take aspirin or any aspirin-containing medications, at least eleven (11) days prior to the procedure.  They may prolong bleeding.  Wear loose fitting clothing that may be easy to take off and that you would not mind if it got stained with Betadine or blood.  Do not wear any jewelry or perfume  Remove any nail coloring.  It will interfere with some of our monitoring equipment.  NOTE: Remember that this is not meant to be interpreted as a complete list of all possible  complications.  Unforeseen problems may occur.  BLOOD THINNERS The following drugs contain aspirin or other products, which can cause increased bleeding during surgery and should not be taken for 2 weeks prior to and 1 week after surgery.  If you should need take something for relief of minor pain, you may take acetaminophen which is found in Tylenol,m Datril, Anacin-3 and Panadol. It is not blood thinner. The products listed below are.  Do not take any of the products listed below in addition to any listed on your instruction sheet.  A.P.C or A.P.C with Codeine Codeine Phosphate Capsules #3 Ibuprofen Ridaura  ABC compound Congesprin Imuran rimadil  Advil Cope Indocin Robaxisal  Alka-Seltzer Effervescent Pain Reliever and Antacid Coricidin or Coricidin-D  Indomethacin Rufen  Alka-Seltzer plus Cold Medicine Cosprin Ketoprofen S-A-C Tablets  Anacin Analgesic Tablets or Capsules Coumadin Korlgesic Salflex  Anacin Extra Strength Analgesic tablets or capsules CP-2 Tablets Lanoril Salicylate  Anaprox Cuprimine Capsules Levenox Salocol  Anexsia-D Dalteparin Magan Salsalate  Anodynos Darvon compound Magnesium Salicylate Sine-off  Ansaid Dasin Capsules Magsal Sodium Salicylate  Anturane Depen Capsules Marnal Soma  APF Arthritis pain formula Dewitt's Pills Measurin Stanback  Argesic Dia-Gesic Meclofenamic Sulfinpyrazone  Arthritis Bayer Timed Release Aspirin Diclofenac Meclomen Sulindac  Arthritis pain formula Anacin Dicumarol Medipren Supac  Analgesic (Safety coated) Arthralgen Diffunasal Mefanamic Suprofen  Arthritis Strength Bufferin Dihydrocodeine Mepro Compound Suprol  Arthropan liquid Dopirydamole Methcarbomol with Aspirin Synalgos  ASA tablets/Enseals Disalcid Micrainin Tagament  Ascriptin Doan's Midol Talwin  Ascriptin A/D Dolene Mobidin Tanderil  Ascriptin Extra Strength Dolobid Moblgesic Ticlid  Ascriptin with Codeine Doloprin or Doloprin with Codeine Momentum Tolectin  Asperbuf Duoprin  Mono-gesic Trendar  Aspergum Duradyne Motrin or Motrin IB Triminicin  Aspirin plain, buffered or enteric coated Durasal Myochrisine Trigesic  Aspirin Suppositories Easprin Nalfon Trillsate  Aspirin with Codeine Ecotrin Regular or Extra Strength Naprosyn Uracel  Atromid-S Efficin Naproxen Ursinus  Auranofin Capsules Elmiron Neocylate Vanquish  Axotal Emagrin Norgesic Verin  Azathioprine Empirin or Empirin with Codeine Normiflo Vitamin E  Azolid Emprazil Nuprin Voltaren  Bayer Aspirin plain, buffered or children's or timed BC Tablets or powders Encaprin Orgaran Warfarin Sodium  Buff-a-Comp Enoxaparin Orudis Zorpin  Buff-a-Comp with Codeine Equegesic Os-Cal-Gesic   Buffaprin Excedrin plain, buffered or Extra Strength Oxalid   Bufferin Arthritis Strength Feldene Oxphenbutazone   Bufferin plain or Extra Strength Feldene Capsules Oxycodone with Aspirin   Bufferin with Codeine Fenoprofen Fenoprofen Pabalate or Pabalate-SF   Buffets II Flogesic Panagesic   Buffinol plain or Extra Strength Florinal or Florinal with Codeine Panwarfarin   Buf-Tabs Flurbiprofen Penicillamine   Butalbital Compound Four-way cold tablets Penicillin   Butazolidin Fragmin Pepto-Bismol   Carbenicillin Geminisyn Percodan   Carna Arthritis Reliever Geopen Persantine   Carprofen Gold's salt Persistin   Chloramphenicol Goody's Phenylbutazone   Chloromycetin Haltrain Piroxlcam   Clmetidine heparin Plaquenil   Cllnoril Hyco-pap Ponstel   Clofibrate Hydroxy chloroquine Propoxyphen         Before stopping any of these medications, be sure to consult the physician who ordered them.  Some, such as Coumadin (Warfarin) are ordered to prevent or treat serious conditions such as "deep thrombosis", "pumonary embolisms", and other heart problems.  The amount of time that you may need off of the medication may also vary with the medication and the reason for which you were taking it.  If you are taking any of these medications, please  make sure you notify your pain physician before you undergo any procedures.   Moderate Conscious Sedation, Adult Sedation is the use of medicines to promote relaxation and relieve discomfort and anxiety. Moderate conscious sedation is a type of sedation. Under moderate conscious sedation, you are less alert than normal, but you are still able to respond to instructions, touch, or both. Moderate conscious sedation is used during short medical and dental procedures. It is milder than deep sedation, which is a type of sedation under which you cannot be easily woken up. It is also milder than general anesthesia, which is the use of medicines to make you unconscious. Moderate conscious sedation allows you to return to your regular activities sooner. Tell a health care provider about:  Any allergies  you have.  All medicines you are taking, including vitamins, herbs, eye drops, creams, and over-the-counter medicines.  Use of steroids (by mouth or creams).  Any problems you or family members have had with sedatives and anesthetic medicines.  Any blood disorders you have.  Any surgeries you have had.  Any medical conditions you have, such as sleep apnea.  Whether you are pregnant or may be pregnant.  Any use of cigarettes, alcohol, marijuana, or street drugs. What are the risks? Generally, this is a safe procedure. However, problems may occur, including:  Getting too much medicine (oversedation).  Nausea.  Allergic reaction to medicines.  Trouble breathing. If this happens, a breathing tube may be used to help with breathing. It will be removed when you are awake and breathing on your own.  Heart trouble.  Lung trouble. What happens before the procedure? Staying hydrated Follow instructions from your health care provider about hydration, which may include:  Up to 2 hours before the procedure - you may continue to drink clear liquids, such as water, clear fruit juice, black coffee,  and plain tea. Eating and drinking restrictions Follow instructions from your health care provider about eating and drinking, which may include:  8 hours before the procedure - stop eating heavy meals or foods such as meat, fried foods, or fatty foods.  6 hours before the procedure - stop eating light meals or foods, such as toast or cereal.  6 hours before the procedure - stop drinking milk or drinks that contain milk.  2 hours before the procedure - stop drinking clear liquids. Medicine Ask your health care provider about:  Changing or stopping your regular medicines. This is especially important if you are taking diabetes medicines or blood thinners.  Taking medicines such as aspirin and ibuprofen. These medicines can thin your blood. Do not take these medicines before your procedure if your health care provider instructs you not to.  Tests and exams  You will have a physical exam.  You may have blood tests done to show: ? How well your kidneys and liver are working. ? How well your blood can clot. General instructions  Plan to have someone take you home from the hospital or clinic.  If you will be going home right after the procedure, plan to have someone with you for 24 hours. What happens during the procedure?  An IV tube will be inserted into one of your veins.  Medicine to help you relax (sedative) will be given through the IV tube.  The medical or dental procedure will be performed. What happens after the procedure?  Your blood pressure, heart rate, breathing rate, and blood oxygen level will be monitored often until the medicines you were given have worn off.  Do not drive for 24 hours. This information is not intended to replace advice given to you by your health care provider. Make sure you discuss any questions you have with your health care provider. Document Revised: 11/15/2017 Document Reviewed: 03/24/2016 Elsevier Patient Education  [place].   Facet Blocks Patient Information  Description: The facets are joints in the spine between the vertebrae.  Like any joints in the body, facets can become irritated and painful.  Arthritis can also effect the facets.  By injecting steroids and local anesthetic in and around these joints, we can temporarily block the nerve supply to them.  Steroids act directly on irritated nerves and tissues to reduce selling and inflammation which often leads to decreased  pain.  Facet blocks may be done anywhere along the spine from the neck to the low back depending upon the location of your pain.   After numbing the skin with local anesthetic (like Novocaine), a small needle is passed onto the facet joints under x-ray guidance.  You may experience a sensation of pressure while this is being done.  The entire block usually lasts about 15-25 minutes.   Conditions which may be treated by facet blocks:   Low back/buttock pain  Neck/shoulder pain  Certain types of headaches  Preparation for the injection:  2. Do not eat any solid food or dairy products within 8 hours of your appointment. 3. You may drink clear liquid up to 3 hours before appointment.  Clear liquids include water, black coffee, juice or soda.  No milk or cream please. 4. You may take your regular medication, including pain medications, with a sip of water before your appointment.  Diabetics should hold regular insulin (if taken separately) and take 1/2 normal NPH dose the morning of the procedure.  Carry some sugar containing items with you to your appointment. 5. A driver must accompany you and be prepared to drive you home after your procedure. 6. Bring all your current medications with you. 7. An IV may be inserted and sedation may be given at the discretion of the physician. 8. A blood pressure cuff, EKG and other monitors will often be applied during the procedure.  Some patients may need to have extra oxygen administered for a short  period. 9. You will be asked to provide medical information, including your allergies and medications, prior to the procedure.  We must know immediately if you are taking blood thinners (like Coumadin/Warfarin) or if you are allergic to IV iodine contrast (dye).  We must know if you could possible be pregnant.  Possible side-effects:   Bleeding from needle site  Infection (rare, may require surgery)  Nerve injury (rare)  Numbness & tingling (temporary)  Difficulty urinating (rare, temporary)  Spinal headache (a headache worse with upright posture)  Light-headedness (temporary)  Pain at injection site (serveral days)  Decreased blood pressure (rare, temporary)  Weakness in arm/leg (temporary)  Pressure sensation in back/neck (temporary)   Call if you experience:   Fever/chills associated with headache or increased back/neck pain  Headache worsened by an upright position  New onset, weakness or numbness of an extremity below the injection site  Hives or difficulty breathing (go to the emergency room)  Inflammation or drainage at the injection site(s)  Severe back/neck pain greater than usual  New symptoms which are concerning to you  Please note:  Although the local anesthetic injected can often make your back or neck feel good for several hours after the injection, the pain will likely return. It takes 3-7 days for steroids to work.  You may not notice any pain relief for at least one week.  If effective, we will often do a series of 2-3 injections spaced 3-6 weeks apart to maximally decrease your pain.  After the initial series, you may be a candidate for a more permanent nerve block of the facets.  If you have any questions, please call #[phone] [facility]

## 2020-09-27 NOTE — Progress Notes (Signed)
Nursing Pain Medication Assessment:  Safety precautions to be maintained throughout the outpatient stay will include: orient to surroundings, keep bed in low position, maintain call bell within reach at all times, provide assistance with transfer out of bed and ambulation.  Medication Inspection Compliance: Pill count conducted under aseptic conditions, in front of the patient. Neither the pills nor the bottle was removed from the patient's sight at any time. Once count was completed pills were immediately returned to the patient in their original bottle.  Medication: Hydrocodone/APAP Pill/Patch Count: 0 of 90 pills remain Pill/Patch Appearance: Markings consistent with prescribed medication Bottle Appearance: Standard pharmacy container. Clearly labeled. Filled Date: 8 / 67 / 2021 Last Medication intake:  Yesterday

## 2020-09-30 ENCOUNTER — Other Ambulatory Visit: Payer: Self-pay

## 2020-09-30 ENCOUNTER — Encounter: Payer: Self-pay | Admitting: Nurse Practitioner

## 2020-09-30 ENCOUNTER — Ambulatory Visit: Payer: 59 | Admitting: Nurse Practitioner

## 2020-09-30 ENCOUNTER — Encounter: Payer: 59 | Admitting: Gastroenterology

## 2020-09-30 VITALS — BP 136/82 | HR 99 | Temp 97.8°F | Ht 68.0 in | Wt 218.0 lb

## 2020-09-30 DIAGNOSIS — R030 Elevated blood-pressure reading, without diagnosis of hypertension: Secondary | ICD-10-CM | POA: Insufficient documentation

## 2020-09-30 HISTORY — DX: Elevated blood-pressure reading, without diagnosis of hypertension: R03.0

## 2020-09-30 NOTE — Progress Notes (Signed)
Established Patient Office Visit  Subjective:  Patient ID: [name], female    DOB: June 09, 1961  Age: 59 y.o. MRN: [id number]  CC:  Chief Complaint  Patient presents with  . Acute Visit    BP issues    HPI [name] is a 59 yo who established care in July via video for URI symptoms.   Elevated BP WO HTN: She presents for BP check. In Aug- GYN check her BP was elevated.  She sees Pain Mgmt and it was high there as well 142/90- no at the time pain. She is on Cymbalta 20 mg a day for fibromyalgia and osteoarthritis and hydrocodone as needed x 1 per day. She has back injections planned for 11/321.   A few weeks ago she felt dizzy headed- very hot outside, ate food and did not feel well that day. She is not sure if that was her BP or her blood sugar. No further episodes. She has no complaints today.    BP Readings from Last 3 Encounters:  09/30/20 136/82  09/27/20 (!) 134/95  08/24/20 (!) 147/90    Past Medical History:  Diagnosis Date  . Arthritis   . Degenerative disorder of bone   . Genuine stress incontinence, female 2012   patient had surgical repair  . GERD (gastroesophageal reflux disease)   . Migraines   . Obesity     Past Surgical History:  Procedure Laterality Date  . excision of vulvar lesion  05/02/2011  . LAPAROSCOPIC HYSTERECTOMY  10/23/2010   mennorhagia/fibroids  . Midurethral sling  05/02/2011    Family History  Problem Relation Age of Onset  . Heart attack Mother   . Hypertension Mother   . Arthritis Mother   . Cancer Father        skin  . Diabetes Son   . Arthritis Maternal Grandmother   . Heart attack Maternal Grandfather   . Heart attack Paternal Grandmother   . Cancer Paternal Grandfather        ear    Social History   Socioeconomic History  . Marital status: Legally Separated    Spouse name: Not on file  . Number of children: Not on file  . Years of education: Not on file  . Highest education level: Not on file    Occupational History  . Not on file  Tobacco Use  . Smoking status: Former Smoker    Types: Cigarettes    Quit date: 01/28/2003    Years since quitting: 17.6  . Smokeless tobacco: Never Used  Vaping Use  . Vaping Use: Never used  Substance and Sexual Activity  . Alcohol use: Not Currently    Comment: socially  . Drug use: No  . Sexual activity: Yes    Partners: Male    Birth control/protection: Surgical  Other Topics Concern  . Not on file  Social History Narrative  . Not on file   Social Determinants of Health   Financial Resource Strain:   . Difficulty of Paying Living Expenses: Not on file  Food Insecurity:   . Worried About Charity fundraiser in the Last Year: Not on file  . Ran Out of Food in the Last Year: Not on file  Transportation Needs:   . Lack of Transportation (Medical): Not on file  . Lack of Transportation (Non-Medical): Not on file  Physical Activity:   . Days of Exercise per Week: Not on file  . Minutes of Exercise per Session:  Not on file  Stress:   . Feeling of Stress : Not on file  Social Connections:   . Frequency of Communication with Friends and Family: Not on file  . Frequency of Social Gatherings with Friends and Family: Not on file  . Attends Religious Services: Not on file  . Active Member of Clubs or Organizations: Not on file  . Attends Archivist Meetings: Not on file  . Marital Status: Not on file  Intimate Partner Violence:   . Fear of Current or Ex-Partner: Not on file  . Emotionally Abused: Not on file  . Physically Abused: Not on file  . Sexually Abused: Not on file    Outpatient Medications Prior to Visit  Medication Sig Dispense Refill  . DULoxetine (CYMBALTA) 20 MG capsule Take 2 capsules (40 mg total) by mouth daily. 60 capsule 3  . gabapentin (NEURONTIN) 300 MG capsule Take 1 capsule (300 mg total) by mouth 2 (two) times daily. 60 capsule 3  . HYDROcodone-acetaminophen (NORCO/VICODIN) 5-325 MG tablet Take 1  tablet by mouth 4 (four) times daily as needed for severe pain. 120 tablet 0  . Melatonin 5 MG CAPS Take 1 capsule by mouth daily.    . Multiple Vitamin (MULTIVITAMIN) tablet Take 1 tablet by mouth daily.    Marland Kitchen omeprazole (PRILOSEC) 20 MG capsule TAKE 1 CAPSULE BY MOUTH EVERY DAY 90 capsule 1  . valACYclovir (VALTREX) 500 MG tablet Take 500 mg by mouth as needed.      No facility-administered medications prior to visit.    No Known Allergies  Review of Systems  Constitutional: Negative.   HENT: Negative.   Eyes: Negative.   Respiratory: Negative.   Cardiovascular: Negative.   Gastrointestinal: Negative.   Endocrine: Negative.   Genitourinary: Negative.   Musculoskeletal: Negative.   Neurological: Positive for dizziness.  Hematological: Negative.   Psychiatric/Behavioral: Negative.       Objective:    Physical Exam Vitals reviewed.  Constitutional:      Appearance: Normal appearance.  Eyes:     Pupils: Pupils are equal, round, and reactive to light.  Cardiovascular:     Rate and Rhythm: Normal rate and regular rhythm.     Pulses: Normal pulses.     Heart sounds: Normal heart sounds.  Pulmonary:     Effort: Pulmonary effort is normal.     Breath sounds: Normal breath sounds.  Musculoskeletal:     Cervical back: Normal range of motion.  Skin:    General: Skin is warm and dry.  Neurological:     General: No focal deficit present.     Mental Status: She is alert and oriented to person, place, and time.  Psychiatric:        Mood and Affect: Mood normal.        Behavior: Behavior normal.     BP 136/82   Pulse 99   Temp 97.8 F (36.6 C) (Oral)   Ht 5\' 8"  (1.727 m)   Wt 218 lb (98.9 kg)   SpO2 97%   BMI 33.15 kg/m  Wt Readings from Last 3 Encounters:  09/30/20 218 lb (98.9 kg)  09/27/20 200 lb (90.7 kg)  08/24/20 219 lb (99.3 kg)   Pulse Readings from Last 3 Encounters:  09/30/20 99  09/27/20 61  08/24/20 71    BP Readings from Last 3 Encounters:    09/30/20 136/82  09/27/20 (!) 134/95  08/24/20 (!) 147/90    Lab Results  Component Value Date  CHOL 184 01/30/2019   HDL 40.60 01/30/2019   LDLCALC 110 (H) 01/30/2019   TRIG 166.0 (H) 01/30/2019   CHOLHDL 5 01/30/2019      Health Maintenance Due  Topic Date Due  . COLONOSCOPY  05/19/2019  . MAMMOGRAM  07/10/2020  . INFLUENZA VACCINE  07/17/2020    There are no preventive care reminders to display for this patient.  Lab Results  Component Value Date   TSH 1.11 01/30/2019   Lab Results  Component Value Date   WBC 5.8 01/30/2019   HGB 15.1 (H) 01/30/2019   HCT 44.8 01/30/2019   MCV 85.6 01/30/2019   PLT 196.0 01/30/2019   Lab Results  Component Value Date   NA 141 01/30/2019   K 4.2 01/30/2019   CO2 28 01/30/2019   GLUCOSE 84 01/30/2019   BUN 8 01/30/2019   CREATININE 0.91 01/30/2019   BILITOT 0.5 01/30/2019   ALKPHOS 91 01/30/2019   AST 20 01/30/2019   ALT 26 01/30/2019   PROT 7.0 01/30/2019   ALBUMIN 4.1 01/30/2019   CALCIUM 10.0 01/30/2019   ANIONGAP 5 05/15/2016   GFR 64.07 01/30/2019   Lab Results  Component Value Date   CHOL 184 01/30/2019   Lab Results  Component Value Date   HDL 40.60 01/30/2019   Lab Results  Component Value Date   LDLCALC 110 (H) 01/30/2019   Lab Results  Component Value Date   TRIG 166.0 (H) 01/30/2019   Lab Results  Component Value Date   CHOLHDL 5 01/30/2019   Lab Results  Component Value Date   HGBA1C 5.4 01/30/2019      Assessment & Plan:   Problem List Items Addressed This Visit      Other   Elevated BP without diagnosis of hypertension - Primary     Please check blood pressure readings at home as we discussed and record.  Bring those in a month and will do your complete physical exam with fasting blood work at that time.  Continue to work on healthy diet, low salt, high potassium, look up the DASH diet.  Continue with walking-3 days a week 30 minutes a day exercise and healthy eating . Weight  loss goal of 5 % in 6 mos  Early high blood pressure can be treated with lifestyle changes.  No orders of the defined types were placed in this encounter.   Follow-up: Return in about 4 weeks (around 10/28/2020).    Denice Paradise, NP

## 2020-09-30 NOTE — Patient Instructions (Addendum)
Please check blood pressure readings at home as we discussed and record.  Bring those in a month and will do your complete physical exam with fasting blood work at that time.  Continue to work on healthy diet, low salt, high potassium, look up the DASH diet.  Continue with walking-3 days a week 30 minutes a day exercise and healthy eating . Weight loss goal of 5 % in 6 mos  Early high blood pressure can be treated with lifestyle changes. Preventing Hypertension Hypertension, commonly called high blood pressure, is when the force of blood pumping through the arteries is too strong. Arteries are blood vessels that carry blood from the heart throughout the body. Over time, hypertension can damage the arteries and decrease blood flow to important parts of the body, including the brain, heart, and kidneys. Often, hypertension does not cause symptoms until blood pressure is very high. For this reason, it is important to have your blood pressure checked on a regular basis. Hypertension can often be prevented with diet and lifestyle changes. If you already have hypertension, you can control it with diet and lifestyle changes, as well as medicine. What nutrition changes can be made? Maintain a healthy diet. This includes:  Eating less salt (sodium). Ask your health care provider how much sodium is safe for you to have. The general recommendation is to consume less than 1 tsp (2,300 mg) of sodium a day. ? Do not add salt to your food. ? Choose low-sodium options when grocery shopping and eating out.  Limiting fats in your diet. You can do this by eating low-fat or fat-free dairy products and by eating less red meat.  Eating more fruits, vegetables, and whole grains. Make a goal to eat: ? 1-2 cups of fresh fruits and vegetables each day. ? 3-4 servings of whole grains each day.  Avoiding foods and beverages that have added sugars.  Eating fish that contain healthy fats (omega-3 fatty acids), such as  mackerel or salmon. If you need help putting together a healthy eating plan, try the DASH diet. This diet is high in fruits, vegetables, and whole grains. It is low in sodium, red meat, and added sugars. DASH stands for Dietary Approaches to Stop Hypertension. What lifestyle changes can be made?   Lose weight if you are overweight. Losing just 3?5% of your body weight can help prevent or control hypertension. ? For example, if your present weight is 200 lb (91 kg), a loss of 3-5% of your weight means losing 6-10 lb (2.7-4.5 kg). ? Ask your health care provider to help you with a diet and exercise plan to safely lose weight.  Get enough exercise. Do at least 150 minutes of moderate-intensity exercise each week. ? You could do this in short exercise sessions several times a day, or you could do longer exercise sessions a few times a week. For example, you could take a brisk 10-minute walk or bike ride, 3 times a day, for 5 days a week.  Find ways to reduce stress, such as exercising, meditating, listening to music, or taking a yoga class. If you need help reducing stress, ask your health care provider.  Do not smoke. This includes e-cigarettes. Chemicals in tobacco and nicotine products raise your blood pressure each time you smoke. If you need help quitting, ask your health care provider.  Avoid alcohol. If you drink alcohol, limit alcohol intake to no more than 1 drink a day for nonpregnant women and 2 drinks a day  for men. One drink equals 12 oz of beer, 5 oz of wine, or 1 oz of hard liquor. Why are these changes important? Diet and lifestyle changes can help you prevent hypertension, and they may make you feel better overall and improve your quality of life. If you have hypertension, making these changes will help you control it and help prevent major complications, such as:  Hardening and narrowing of arteries that supply blood to: ? Your heart. This can cause a heart attack. ? Your brain.  This can cause a stroke. ? Your kidneys. This can cause kidney failure.  Stress on your heart muscle, which can cause heart failure. What can I do to lower my risk?  Work with your health care provider to make a hypertension prevention plan that works for you. Follow your plan and keep all follow-up visits as told by your health care provider.  Learn how to check your blood pressure at home. Make sure that you know your personal target blood pressure, as told by your health care provider. How is this treated? In addition to diet and lifestyle changes, your health care provider may recommend medicines to help lower your blood pressure. You may need to try a few different medicines to find what works best for you. You also may need to take more than one medicine. Take over-the-counter and prescription medicines only as told by your health care provider. Where to find support Your health care provider can help you prevent hypertension and help you keep your blood pressure at a healthy level. Your local hospital or your community may also provide support services and prevention programs. The American Heart Association offers an online support network at: CheapBootlegs.com.cy Where to find more information Learn more about hypertension from:  [place], Lung, and Blood Institute: ElectronicHangman.is  Centers for Disease Control and Prevention: https://ingram.com/  American Academy of Family Physicians: http://familydoctor.org/familydoctor/en/diseases-conditions/high-blood-pressure.printerview.all.html Learn more about the DASH diet from:  [place], Lung, and [place]: https://www.reyes.com/ Contact a health care provider if:  You think you are having a reaction to medicines you have taken.  You have recurrent headaches or feel dizzy.  You have swelling in your ankles.  You have  trouble with your vision. Summary  Hypertension often does not cause any symptoms until blood pressure is very high. It is important to get your blood pressure checked regularly.  Diet and lifestyle changes are the most important steps in preventing hypertension.  By keeping your blood pressure in a healthy range, you can prevent complications like heart attack, heart failure, stroke, and kidney failure.  Work with your health care provider to make a hypertension prevention plan that works for you. This information is not intended to replace advice given to you by your health care provider. Make sure you discuss any questions you have with your health care provider. Document Revised: 03/27/2019 Document Reviewed: 08/13/2016 Elsevier Patient Education  [place] DASH stands for "Dietary Approaches to Stop Hypertension." The DASH eating plan is a healthy eating plan that has been shown to reduce high blood pressure (hypertension). It may also reduce your risk for type 2 diabetes, heart disease, and stroke. The DASH eating plan may also help with weight loss. What are tips for following this plan?  General guidelines  Avoid eating more than 2,300 mg (milligrams) of salt (sodium) a day. If you have hypertension, you may need to reduce your sodium intake to 1,500 mg a day.  Limit alcohol  intake to no more than 1 drink a day for nonpregnant women and 2 drinks a day for men. One drink equals 12 oz of beer, 5 oz of wine, or 1 oz of hard liquor.  Work with your health care provider to maintain a healthy body weight or to lose weight. Ask what an ideal weight is for you.  Get at least 30 minutes of exercise that causes your heart to beat faster (aerobic exercise) most days of the week. Activities may include walking, swimming, or biking.  Work with your health care provider or diet and nutrition specialist (dietitian) to adjust your eating plan to your individual  calorie needs. Reading food labels   Check food labels for the amount of sodium per serving. Choose foods with less than 5 percent of the Daily Value of sodium. Generally, foods with less than 300 mg of sodium per serving fit into this eating plan.  To find whole grains, look for the word "whole" as the first word in the ingredient list. Shopping  Buy products labeled as "low-sodium" or "no salt added."  Buy fresh foods. Avoid canned foods and premade or frozen meals. Cooking  Avoid adding salt when cooking. Use salt-free seasonings or herbs instead of table salt or sea salt. Check with your health care provider or pharmacist before using salt substitutes.  Do not fry foods. Cook foods using healthy methods such as baking, boiling, grilling, and broiling instead.  Cook with heart-healthy oils, such as olive, canola, soybean, or sunflower oil. Meal planning  Eat a balanced diet that includes: ? 5 or more servings of fruits and vegetables each day. At each meal, try to fill half of your plate with fruits and vegetables. ? Up to 6-8 servings of whole grains each day. ? Less than 6 oz of lean meat, poultry, or fish each day. A 3-oz serving of meat is about the same size as a deck of cards. One egg equals 1 oz. ? 2 servings of low-fat dairy each day. ? A serving of nuts, seeds, or beans 5 times each week. ? Heart-healthy fats. Healthy fats called Omega-3 fatty acids are found in foods such as flaxseeds and coldwater fish, like sardines, salmon, and mackerel.  Limit how much you eat of the following: ? Canned or prepackaged foods. ? Food that is high in trans fat, such as fried foods. ? Food that is high in saturated fat, such as fatty meat. ? Sweets, desserts, sugary drinks, and other foods with added sugar. ? Full-fat dairy products.  Do not salt foods before eating.  Try to eat at least 2 vegetarian meals each week.  Eat more home-cooked food and less restaurant, buffet, and fast  food.  When eating at a restaurant, ask that your food be prepared with less salt or no salt, if possible. What foods are recommended? The items listed may not be a complete list. Talk with your dietitian about what dietary choices are best for you. Grains Whole-grain or whole-wheat bread. Whole-grain or whole-wheat pasta. Brown rice. [place]. Bulgur. Whole-grain and low-sodium cereals. Pita bread. Low-fat, low-sodium crackers. Whole-wheat flour tortillas. Vegetables Fresh or frozen vegetables (raw, steamed, roasted, or grilled). Low-sodium or reduced-sodium tomato and vegetable juice. Low-sodium or reduced-sodium tomato sauce and tomato paste. Low-sodium or reduced-sodium canned vegetables. Fruits All fresh, dried, or frozen fruit. Canned fruit in natural juice (without added sugar). Meat and other protein foods Skinless chicken or [place]. Ground chicken or [place]. Pork with fat trimmed off.  Fish and seafood. Egg whites. Dried beans, peas, or lentils. Unsalted nuts, nut butters, and seeds. Unsalted canned beans. Lean cuts of beef with fat trimmed off. Low-sodium, lean deli meat. Dairy Low-fat (1%) or fat-free (skim) milk. Fat-free, low-fat, or reduced-fat cheeses. Nonfat, low-sodium ricotta or cottage cheese. Low-fat or nonfat yogurt. Low-fat, low-sodium cheese. Fats and oils Soft margarine without trans fats. Vegetable oil. Low-fat, reduced-fat, or light mayonnaise and salad dressings (reduced-sodium). Canola, safflower, olive, soybean, and sunflower oils. Avocado. Seasoning and other foods Herbs. Spices. Seasoning mixes without salt. Unsalted popcorn and pretzels. Fat-free sweets. What foods are not recommended? The items listed may not be a complete list. Talk with your dietitian about what dietary choices are best for you. Grains Baked goods made with fat, such as croissants, muffins, or some breads. Dry pasta or rice meal packs. Vegetables Creamed or fried vegetables. Vegetables  in a cheese sauce. Regular canned vegetables (not low-sodium or reduced-sodium). Regular canned tomato sauce and paste (not low-sodium or reduced-sodium). Regular tomato and vegetable juice (not low-sodium or reduced-sodium). Angie Fava. Olives. Fruits Canned fruit in a light or heavy syrup. Fried fruit. Fruit in cream or butter sauce. Meat and other protein foods Fatty cuts of meat. Ribs. Fried meat. Berniece Salines. Sausage. Bologna and other processed lunch meats. Salami. Fatback. Hotdogs. Bratwurst. Salted nuts and seeds. Canned beans with added salt. Canned or smoked fish. Whole eggs or egg yolks. Chicken or [place] with skin. Dairy Whole or 2% milk, cream, and half-and-half. Whole or full-fat cream cheese. Whole-fat or sweetened yogurt. Full-fat cheese. Nondairy creamers. Whipped toppings. Processed cheese and cheese spreads. Fats and oils Butter. Stick margarine. Lard. Shortening. Ghee. Bacon fat. Tropical oils, such as coconut, palm kernel, or palm oil. Seasoning and other foods Salted popcorn and pretzels. Onion salt, garlic salt, seasoned salt, table salt, and sea salt. Worcestershire sauce. Tartar sauce. Barbecue sauce. Teriyaki sauce. Soy sauce, including reduced-sodium. Steak sauce. Canned and packaged gravies. Fish sauce. Oyster sauce. Cocktail sauce. Horseradish that you find on the shelf. Ketchup. Mustard. Meat flavorings and tenderizers. Bouillon cubes. Hot sauce and Tabasco sauce. Premade or packaged marinades. Premade or packaged taco seasonings. Relishes. Regular salad dressings. Where to find more information:  National Heart, Lung, and [place]: https://wilson-eaton.com/  American Heart Association: www.heart.org Summary  The DASH eating plan is a healthy eating plan that has been shown to reduce high blood pressure (hypertension). It may also reduce your risk for type 2 diabetes, heart disease, and stroke.  With the DASH eating plan, you should limit salt (sodium) intake to 2,300 mg a  day. If you have hypertension, you may need to reduce your sodium intake to 1,500 mg a day.  When on the DASH eating plan, aim to eat more fresh fruits and vegetables, whole grains, lean proteins, low-fat dairy, and heart-healthy fats.  Work with your health care provider or diet and nutrition specialist (dietitian) to adjust your eating plan to your individual calorie needs. This information is not intended to replace advice given to you by your health care provider. Make sure you discuss any questions you have with your health care provider. Document Revised: 11/15/2017 Document Reviewed: 11/26/2016 Elsevier Patient Education  2020 [employer].

## 2020-10-03 ENCOUNTER — Encounter: Payer: Self-pay | Admitting: Family Medicine

## 2020-10-03 ENCOUNTER — Encounter: Payer: Self-pay | Admitting: Nurse Practitioner

## 2020-10-19 ENCOUNTER — Encounter: Payer: Self-pay | Admitting: Student in an Organized Health Care Education/Training Program

## 2020-10-19 ENCOUNTER — Ambulatory Visit
Admission: RE | Admit: 2020-10-19 | Discharge: 2020-10-19 | Disposition: A | Discharge status: Home / Self Care | Payer: 59 | Source: Ambulatory Visit | Attending: Student in an Organized Health Care Education/Training Program | Admitting: Student in an Organized Health Care Education/Training Program

## 2020-10-19 ENCOUNTER — Ambulatory Visit (HOSPITAL_BASED_OUTPATIENT_CLINIC_OR_DEPARTMENT_OTHER): Payer: 59 | Admitting: Student in an Organized Health Care Education/Training Program

## 2020-10-19 ENCOUNTER — Other Ambulatory Visit: Payer: Self-pay

## 2020-10-19 VITALS — BP 135/105 | HR 76 | Temp 98.3°F | Resp 13 | Ht 68.0 in | Wt 210.0 lb

## 2020-10-19 DIAGNOSIS — G8929 Other chronic pain: Secondary | ICD-10-CM | POA: Insufficient documentation

## 2020-10-19 DIAGNOSIS — M25511 Pain in right shoulder: Secondary | ICD-10-CM | POA: Diagnosis not present

## 2020-10-19 DIAGNOSIS — G894 Chronic pain syndrome: Secondary | ICD-10-CM | POA: Insufficient documentation

## 2020-10-19 DIAGNOSIS — M5416 Radiculopathy, lumbar region: Secondary | ICD-10-CM

## 2020-10-19 DIAGNOSIS — M47816 Spondylosis without myelopathy or radiculopathy, lumbar region: Secondary | ICD-10-CM

## 2020-10-19 DIAGNOSIS — M75101 Unspecified rotator cuff tear or rupture of right shoulder, not specified as traumatic: Secondary | ICD-10-CM

## 2020-10-19 DIAGNOSIS — M12811 Other specific arthropathies, not elsewhere classified, right shoulder: Secondary | ICD-10-CM | POA: Insufficient documentation

## 2020-10-19 MED ORDER — DEXAMETHASONE SODIUM PHOSPHATE 10 MG/ML IJ SOLN
INTRAMUSCULAR | Status: AC
  Filled 2020-10-19: qty 1

## 2020-10-19 MED ORDER — IOHEXOL 180 MG/ML  SOLN
10.0000 mL | Freq: Once | INTRAMUSCULAR | Status: AC
  Administered 2020-10-19: 10 mL via INTRA_ARTICULAR

## 2020-10-19 MED ORDER — LIDOCAINE HCL 2 % IJ SOLN
20.0000 mL | Freq: Once | INTRAMUSCULAR | Status: AC
  Administered 2020-10-19: 400 mg

## 2020-10-19 MED ORDER — DEXAMETHASONE SODIUM PHOSPHATE 10 MG/ML IJ SOLN
10.0000 mg | Freq: Once | INTRAMUSCULAR | Status: AC
  Administered 2020-10-19: 10 mg

## 2020-10-19 MED ORDER — METHYLPREDNISOLONE ACETATE 40 MG/ML IJ SUSP
INTRAMUSCULAR | Status: AC
  Filled 2020-10-19: qty 1

## 2020-10-19 MED ORDER — FENTANYL CITRATE (PF) 100 MCG/2ML IJ SOLN
25.0000 ug | INTRAMUSCULAR | Status: AC | PRN
  Administered 2020-10-19: 25 ug via INTRAVENOUS
  Administered 2020-10-19: 50 ug via INTRAVENOUS

## 2020-10-19 MED ORDER — FENTANYL CITRATE (PF) 100 MCG/2ML IJ SOLN
INTRAMUSCULAR | Status: AC
  Filled 2020-10-19: qty 2

## 2020-10-19 MED ORDER — LIDOCAINE HCL 2 % IJ SOLN
INTRAMUSCULAR | Status: AC
  Filled 2020-10-19: qty 20

## 2020-10-19 MED ORDER — ROPIVACAINE HCL 2 MG/ML IJ SOLN
9.0000 mL | Freq: Once | INTRAMUSCULAR | Status: AC
  Administered 2020-10-19: 9 mL via PERINEURAL

## 2020-10-19 MED ORDER — METHYLPREDNISOLONE ACETATE 40 MG/ML IJ SUSP
40.0000 mg | Freq: Once | INTRAMUSCULAR | Status: AC
  Administered 2020-10-19: 40 mg via INTRA_ARTICULAR

## 2020-10-19 MED ORDER — ROPIVACAINE HCL 2 MG/ML IJ SOLN
INTRAMUSCULAR | Status: AC
  Filled 2020-10-19: qty 10

## 2020-10-19 NOTE — Progress Notes (Signed)
PROVIDER NOTE: Information contained herein reflects review and annotations entered in association with encounter. Interpretation of such information and data should be left to medically-trained personnel. Information provided to patient can be located elsewhere in the medical record under "Patient Instructions". Document created using STT-dictation technology, any transcriptional errors that may result from process are unintentional.    Patient: [name]  Service Category: Procedure  Provider: Gillis Santa, MD  DOB: 1961-03-11  DOS: 10/19/2020  Location: [facility]  MRN: [id number]  Setting: Ambulatory - outpatient  Referring Provider: Marval Regal, NP  Type: Established Patient  Specialty: Interventional Pain Management  PCP: Marval Regal, NP   Primary Reason for Visit: Interventional Pain Management Treatment. CC: Back Pain (low) and Shoulder Pain (right)  Procedure:          Anesthesia, Analgesia, Anxiolysis:  Type: Lumbar Facet, Medial Branch Block(s) #1  Primary Purpose: Diagnostic Region: Posterolateral Lumbosacral Spine Level:L3, L4, L5, Medial Branch Level(s). Injecting these levels blocks the L3-4, L4-5, lumbar facet joints. Laterality: Bilateral  Type: Moderate (Conscious) Sedation combined with Local Anesthesia Indication(s): Analgesia and Anxiety Route: Intravenous (IV) IV Access: Secured Sedation: Meaningful verbal contact was maintained at all times during the procedure  Local Anesthetic: Lidocaine 1-2%  Position: Prone   Indications: 1. Lumbar spondylosis   2. Lumbar facet arthropathy   3. Lumbar facetogenic pain   4. Chronic pain syndrome    Pain Score: Pre-procedure: 7 /10 Post-procedure: 0-No pain/10   Pre-op Assessment:  [name] is a 59 y.o. (year old), female patient, seen today for interventional treatment. She  has a past surgical history that includes Laparoscopic hysterectomy (10/23/2010); Midurethral sling (05/02/2011);  and excision of vulvar lesion (05/02/2011). [name] has a current medication list which includes the following prescription(s): duloxetine, gabapentin, hydrocodone-acetaminophen, melatonin, multivitamin, omeprazole, and valacyclovir. Her primarily concern today is the Back Pain (low) and Shoulder Pain (right)  Initial Vital Signs:  Pulse/HCG Rate: (!) 120ECG Heart Rate: (!) 105 Temp: (!) 96.9 F (36.1 C) Resp: 18 BP: (!) 175/74 SpO2: 97 %  BMI: Estimated body mass index is 31.93 kg/m as calculated from the following:   Height as of this encounter: 5\' 8"  (1.727 m).   Weight as of this encounter: 210 lb (95.3 kg).  Risk Assessment: Allergies: Reviewed. She has No Known Allergies.  Allergy Precautions: None required Coagulopathies: Reviewed. None identified.  Blood-thinner therapy: None at this time Active Infection(s): Reviewed. None identified. [name] is afebrile  Site Confirmation: [name] was asked to confirm the procedure and laterality before marking the site Procedure checklist: Completed Consent: Before the procedure and under the influence of no sedative(s), amnesic(s), or anxiolytics, the patient was informed of the treatment options, risks and possible complications. To fulfill our ethical and legal obligations, as recommended by the American Medical Association's Code of Ethics, I have informed the patient of my clinical impression; the nature and purpose of the treatment or procedure; the risks, benefits, and possible complications of the intervention; the alternatives, including doing nothing; the risk(s) and benefit(s) of the alternative treatment(s) or procedure(s); and the risk(s) and benefit(s) of doing nothing. The patient was provided information about the general risks and possible complications associated with the procedure. These may include, but are not limited to: failure to achieve desired goals, infection, bleeding, organ or nerve damage, allergic reactions,  paralysis, and death. In addition, the patient was informed of those risks and complications associated to Spine-related procedures, such as failure to  decrease pain; infection (i.e.: Meningitis, epidural or intraspinal abscess); bleeding (i.e.: epidural hematoma, subarachnoid hemorrhage, or any other type of intraspinal or peri-dural bleeding); organ or nerve damage (i.e.: Any type of peripheral nerve, nerve root, or spinal cord injury) with subsequent damage to sensory, motor, and/or autonomic systems, resulting in permanent pain, numbness, and/or weakness of one or several areas of the body; allergic reactions; (i.e.: anaphylactic reaction); and/or death. Furthermore, the patient was informed of those risks and complications associated with the medications. These include, but are not limited to: allergic reactions (i.e.: anaphylactic or anaphylactoid reaction(s)); adrenal axis suppression; blood sugar elevation that in diabetics may result in ketoacidosis or comma; water retention that in patients with history of congestive heart failure may result in shortness of breath, pulmonary edema, and decompensation with resultant heart failure; weight gain; swelling or edema; medication-induced neural toxicity; particulate matter embolism and blood vessel occlusion with resultant organ, and/or nervous system infarction; and/or aseptic necrosis of one or more joints. Finally, the patient was informed that Medicine is not an exact science; therefore, there is also the possibility of unforeseen or unpredictable risks and/or possible complications that may result in a catastrophic outcome. The patient indicated having understood very clearly. We have given the patient no guarantees and we have made no promises. Enough time was given to the patient to ask questions, all of which were answered to the patient's satisfaction. [name] has indicated that she wanted to continue with the procedure. Attestation: I, the ordering  provider, attest that I have discussed with the patient the benefits, risks, side-effects, alternatives, likelihood of achieving goals, and potential problems during recovery for the procedure that I have provided informed consent. Date  Time: 10/19/2020 10:33 AM  Pre-Procedure Preparation:  Monitoring: As per clinic protocol. Respiration, ETCO2, SpO2, BP, heart rate and rhythm monitor placed and checked for adequate function Safety Precautions: Patient was assessed for positional comfort and pressure points before starting the procedure. Time-out: I initiated and conducted the "Time-out" before starting the procedure, as per protocol. The patient was asked to participate by confirming the accuracy of the "Time Out" information. Verification of the correct person, site, and procedure were performed and confirmed by me, the nursing staff, and the patient. "Time-out" conducted as per Joint Commission's Universal Protocol (UP.01.01.01). Time: 1100  Description of Procedure:          Laterality: Bilateral. The procedure was performed in identical fashion on both sides. Levels:  L3, L4, L5,  Medial Branch Level(s) Area Prepped: Posterior Lumbosacral Region DuraPrep (Iodine Povacrylex [0.7% available iodine] and Isopropyl Alcohol, 74% w/w) Safety Precautions: Aspiration looking for blood return was conducted prior to all injections. At no point did we inject any substances, as a needle was being advanced. Before injecting, the patient was told to immediately notify me if she was experiencing any new onset of "ringing in the ears, or metallic taste in the mouth". No attempts were made at seeking any paresthesias. Safe injection practices and needle disposal techniques used. Medications properly checked for expiration dates. SDV (single dose vial) medications used. After the completion of the procedure, all disposable equipment used was discarded in the proper designated medical waste containers. Local  Anesthesia: Protocol guidelines were followed. The patient was positioned over the fluoroscopy table. The area was prepped in the usual manner. The time-out was completed. The target area was identified using fluoroscopy. A 12-in long, straight, sterile hemostat was used with fluoroscopic guidance to locate the targets for each level blocked.  Once located, the skin was marked with an approved surgical skin marker. Once all sites were marked, the skin (epidermis, dermis, and hypodermis), as well as deeper tissues (fat, connective tissue and muscle) were infiltrated with a small amount of a short-acting local anesthetic, loaded on a 10cc syringe with a 25G, 1.5-in  Needle. An appropriate amount of time was allowed for local anesthetics to take effect before proceeding to the next step. Local Anesthetic: Lidocaine 2.0% The unused portion of the local anesthetic was discarded in the proper designated containers. Technical explanation of process:  L3 Medial Branch Nerve Block (MBB): The target area for the L3 medial branch is at the junction of the postero-lateral aspect of the superior articular process and the superior, posterior, and medial edge of the transverse process of L4. Under fluoroscopic guidance, a Quincke needle was inserted until contact was made with os over the superior postero-lateral aspect of the pedicular shadow (target area). After negative aspiration for blood, 1.5 mL of the nerve block solution was injected without difficulty or complication. The needle was removed intact. L4 Medial Branch Nerve Block (MBB): The target area for the L4 medial branch is at the junction of the postero-lateral aspect of the superior articular process and the superior, posterior, and medial edge of the transverse process of L5. Under fluoroscopic guidance, a Quincke needle was inserted until contact was made with os over the superior postero-lateral aspect of the pedicular shadow (target area). After negative  aspiration for blood, 1.5 mL of the nerve block solution was injected without difficulty or complication. The needle was removed intact. L5 Medial Branch Nerve Block (MBB): The target area for the L5 medial branch is at the junction of the postero-lateral aspect of the superior articular process and the superior, posterior, and medial edge of the sacral ala. Under fluoroscopic guidance, a Quincke needle was inserted until contact was made with os over the superior postero-lateral aspect of the pedicular shadow (target area). After negative aspiration for blood,1.5 mL of the nerve block solution was injected without difficulty or complication. The needle was removed intact.  Nerve block solution: 10 cc solution made of 8 cc of 0.2% ropivacaine, 2 cc of Decadron 10 mg/cc.  1.5 cc injected at each level above bilaterally.  The unused portion of the solution was discarded in the proper designated containers. Procedural Needles: 22-gauge, 3.5-inch, Quincke needles used for all levels.  Once the entire procedure was completed, the treated area was cleaned, making sure to leave some of the prepping solution back to take advantage of its long term bactericidal properties.   Illustration of the posterior view of the lumbar spine and the posterior neural structures. Laminae of L2 through S1 are labeled. DPRL5, dorsal primary ramus of L5; DPRS1, dorsal primary ramus of S1; DPR3, dorsal primary ramus of L3; FJ, facet (zygapophyseal) joint L3-L4; I, inferior articular process of L4; LB1, lateral branch of dorsal primary ramus of L1; IAB, inferior articular branches from L3 medial branch (supplies L4-L5 facet joint); IBP, intermediate branch plexus; MB3, medial branch of dorsal primary ramus of L3; NR3, third lumbar nerve root; S, superior articular process of L5; SAB, superior articular branches from L4 (supplies L4-5 facet joint also); TP3, transverse process of L3.  Vitals:   10/19/20 1120 10/19/20 1124 10/19/20  1134 10/19/20 1143  BP: (!) 153/97 (!) 155/100 (!) 135/105   Pulse: 76     Resp: 18 (!) 23 18 13   Temp:  98.6 F (37 C)  98.3  F (36.8 C)  TempSrc:  Temporal  Temporal  SpO2: 99% 100% 99% 98%  Weight:      Height:         Start Time: 1100 hrs. End Time: 1119 hrs.  Imaging Guidance (Spinal):          Type of Imaging Technique: Fluoroscopy Guidance (Spinal) Indication(s): Assistance in needle guidance and placement for procedures requiring needle placement in or near specific anatomical locations not easily accessible without such assistance. Exposure Time: Please see nurses notes. Contrast: None used. Fluoroscopic Guidance: I was personally present during the use of fluoroscopy. "Tunnel Vision Technique" used to obtain the best possible view of the target area. Parallax error corrected before commencing the procedure. "Direction-depth-direction" technique used to introduce the needle under continuous pulsed fluoroscopy. Once target was reached, antero-posterior, oblique, and lateral fluoroscopic projection used confirm needle placement in all planes. Images permanently stored in EMR. Interpretation: No contrast injected. I personally interpreted the imaging intraoperatively. Adequate needle placement confirmed in multiple planes. Permanent images saved into the patient's record.  Antibiotic Prophylaxis:   Anti-infectives (From admission, onward)   None     Indication(s): None identified  Post-operative Assessment:  Post-procedure Vital Signs:  Pulse/HCG Rate: 7678 Temp: 98.3 F (36.8 C) Resp: 13 BP: (!) 135/105 (elevated Bp's reported to Dr Holley Raring, patient is symptomatic.) SpO2: 98 %  EBL: None  Complications: No immediate post-treatment complications observed by team, or reported by patient.  Note: The patient tolerated the entire procedure well. A repeat set of vitals were taken after the procedure and the patient was kept under observation following institutional  policy, for this type of procedure. Post-procedural neurological assessment was performed, showing return to baseline, prior to discharge. The patient was provided with post-procedure discharge instructions, including a section on how to identify potential problems. Should any problems arise concerning this procedure, the patient was given instructions to immediately contact us, at any time, without hesitation. In any case, we plan to contact the patient by telephone for a follow-up status report regarding this interventional procedure.  Comments:  No additional relevant information.  Plan of Care  Orders:  Orders Placed This Encounter  Procedures  . [facility] C-ARM 1-60 MIN NO REPORT    Intraoperative interpretation by procedural physician at [place].    Standing Status:   Standing    Number of Occurrences:   1    Order Specific Question:   Reason for exam:    Answer:   Assistance in needle guidance and placement for procedures requiring needle placement in or near specific anatomical locations not easily accessible without such assistance.   Medications ordered for procedure: Meds ordered this encounter  Medications  . lidocaine (XYLOCAINE) 2 % (with pres) injection 400 mg  . fentaNYL (SUBLIMAZE) injection 25-50 mcg    Make sure Narcan is available in the pyxis when using this medication. In the event of respiratory depression (RR< 8/min): Titrate NARCAN (naloxone) in increments of 0.1 to 0.2 mg IV at 2-3 minute intervals, until desired degree of reversal.  . ropivacaine (PF) 2 mg/mL (0.2%) (NAROPIN) injection 9 mL  . ropivacaine (PF) 2 mg/mL (0.2%) (NAROPIN) injection 9 mL  . dexamethasone (DECADRON) injection 10 mg  . dexamethasone (DECADRON) injection 10 mg  . methylPREDNISolone acetate (DEPO-MEDROL) injection 40 mg  . iohexol (OMNIPAQUE) 180 MG/ML injection 10 mL    Must be Myelogram-compatible. If not available, you may substitute with a water-soluble,  non-ionic, hypoallergenic, myelogram-compatible radiological contrast  medium.   Medications administered: We administered lidocaine, fentaNYL, ropivacaine (PF) 2 mg/mL (0.2%), ropivacaine (PF) 2 mg/mL (0.2%), dexamethasone, dexamethasone, methylPREDNISolone acetate, and iohexol.  See the medical record for exact dosing, route, and time of administration.  Follow-up plan:   Return for [facility] appt.      s/p IA hyalgan #1 on 08/19/2019, #2 09/23/2019 not very effective unfortunately.  Lumbar MRI shows facet disease at L3, L4, L5 bilaterally.  Bilateral L3, L4, L5 medial branch nerve block, right anterior glenohumeral joint injection 10/19/2020          Recent Visits Date Type Provider Dept  09/27/20 Office Visit Gillis Santa, MD [facility]  08/24/20 Procedure visit Gillis Santa, MD [facility]  07/28/20 Office Visit Gillis Santa, MD [facility]  Showing recent visits within past 90 days and meeting all other requirements Today's Visits Date Type Provider Dept  10/19/20 Procedure visit Gillis Santa, MD [facility]  Showing today's visits and meeting all other requirements Future Appointments Date Type Provider Dept  11/21/20 Appointment Gillis Santa, MD [facility]  Showing future appointments within next 90 days and meeting all other requirements  Disposition: Discharge home  Discharge (Date  Time): 10/19/2020; 1149 hrs.   Primary Care Physician: Marval Regal, NP Location: [facility] Note by: Gillis Santa, MD Date: 10/19/2020; Time: 1:47 PM  Disclaimer:  Medicine is not an exact science. The only guarantee in medicine is that nothing is guaranteed. It is important to note that the decision to proceed with this intervention was based on the information collected from the patient. The Data and conclusions were drawn from the patient's questionnaire, the interview, and the physical examination. Because  the information was provided in large part by the patient, it cannot be guaranteed that it has not been purposely or unconsciously manipulated. Every effort has been made to obtain as much relevant data as possible for this evaluation. It is important to note that the conclusions that lead to this procedure are derived in large part from the available data. Always take into account that the treatment will also be dependent on availability of resources and existing treatment guidelines, considered by other Pain Management Practitioners as being common knowledge and practice, at the time of the intervention. For Medico-Legal purposes, it is also important to point out that variation in procedural techniques and pharmacological choices are the acceptable norm. The indications, contraindications, technique, and results of the above procedure should only be interpreted and judged by a Board-Certified Interventional Pain Specialist with extensive familiarity and expertise in the same exact procedure and technique.

## 2020-10-19 NOTE — Progress Notes (Signed)
PROVIDER NOTE: Information contained herein reflects review and annotations entered in association with encounter. Interpretation of such information and data should be left to medically-trained personnel. Information provided to patient can be located elsewhere in the medical record under "Patient Instructions". Document created using STT-dictation technology, any transcriptional errors that may result from process are unintentional.    Patient: [name]  Service Category: Procedure  Provider: Gillis Santa, MD  DOB: [birth year]  DOS: 10/19/2020  Location: [facility]  MRN: [id number]  Setting: Ambulatory - outpatient  Referring Provider: Marval Regal, NP  Type: Established Patient  Specialty: Interventional Pain Management  PCP: Marval Regal, NP   Primary Reason for Visit: Interventional Pain Management Treatment. CC: Back Pain (low) and Shoulder Pain (right)  Procedure:          Anesthesia, Analgesia, Anxiolysis:  Type: Diagnostic Glenohumeral Joint (shoulder) Injection #1  Primary Purpose: Diagnostic Region: Anterior Shoulder Area Level:  Shoulder Target Area: Glenohumeral Joint (shoulder) Approach: Anterolateral approach. Laterality: Right-Sided  Type: Moderate (Conscious) Sedation combined with Local Anesthesia Indication(s): Analgesia and Anxiety Route: Intravenous (IV) IV Access: Secured Sedation: Meaningful verbal contact was maintained at all times during the procedure  Local Anesthetic: Lidocaine 1-2%  Position: Supine   Indications: Right rotator cuff dysfunction, right shoulder pain, right glenohumeral arthropathy.  Pain Score: Pre-procedure: 7 /10 Post-procedure: 0-No pain/10   Pre-op Assessment:  [name] is a 59 y.o. (year old), female patient, seen today for interventional treatment. She  has a past surgical history that includes Laparoscopic hysterectomy (10/23/2010); Midurethral sling (05/02/2011); and excision of vulvar lesion  (05/02/2011). [name] has a current medication list which includes the following prescription(s): duloxetine, gabapentin, hydrocodone-acetaminophen, melatonin, multivitamin, omeprazole, and valacyclovir. Her primarily concern today is the Back Pain (low) and Shoulder Pain (right)  Initial Vital Signs:  Pulse/HCG Rate: (!) 120ECG Heart Rate: (!) 105 Temp: (!) 96.9 F (36.1 C) Resp: 18 BP: (!) 175/74 SpO2: 97 %  BMI: Estimated body mass index is 31.93 kg/m as calculated from the following:   Height as of this encounter: 5\' 8"  (1.727 m).   Weight as of this encounter: 210 lb (95.3 kg).  Risk Assessment: Allergies: Reviewed. She has No Known Allergies.  Allergy Precautions: None required Coagulopathies: Reviewed. None identified.  Blood-thinner therapy: None at this time Active Infection(s): Reviewed. None identified. [name] is afebrile  Site Confirmation: [name] was asked to confirm the procedure and laterality before marking the site Procedure checklist: Completed Consent: Before the procedure and under the influence of no sedative(s), amnesic(s), or anxiolytics, the patient was informed of the treatment options, risks and possible complications. To fulfill our ethical and legal obligations, as recommended by the American Medical Association's Code of Ethics, I have informed the patient of my clinical impression; the nature and purpose of the treatment or procedure; the risks, benefits, and possible complications of the intervention; the alternatives, including doing nothing; the risk(s) and benefit(s) of the alternative treatment(s) or procedure(s); and the risk(s) and benefit(s) of doing nothing. The patient was provided information about the general risks and possible complications associated with the procedure. These may include, but are not limited to: failure to achieve desired goals, infection, bleeding, organ or nerve damage, allergic reactions, paralysis, and death. In  addition, the patient was informed of those risks and complications associated to the procedure, such as failure to decrease pain; infection; bleeding; organ or nerve damage with subsequent damage to sensory, motor, and/or autonomic systems, resulting  in permanent pain, numbness, and/or weakness of one or several areas of the body; allergic reactions; (i.e.: anaphylactic reaction); and/or death. Furthermore, the patient was informed of those risks and complications associated with the medications. These include, but are not limited to: allergic reactions (i.e.: anaphylactic or anaphylactoid reaction(s)); adrenal axis suppression; blood sugar elevation that in diabetics may result in ketoacidosis or comma; water retention that in patients with history of congestive heart failure may result in shortness of breath, pulmonary edema, and decompensation with resultant heart failure; weight gain; swelling or edema; medication-induced neural toxicity; particulate matter embolism and blood vessel occlusion with resultant organ, and/or nervous system infarction; and/or aseptic necrosis of one or more joints. Finally, the patient was informed that Medicine is not an exact science; therefore, there is also the possibility of unforeseen or unpredictable risks and/or possible complications that may result in a catastrophic outcome. The patient indicated having understood very clearly. We have given the patient no guarantees and we have made no promises. Enough time was given to the patient to ask questions, all of which were answered to the patient's satisfaction. [name] has indicated that she wanted to continue with the procedure. Attestation: I, the ordering provider, attest that I have discussed with the patient the benefits, risks, side-effects, alternatives, likelihood of achieving goals, and potential problems during recovery for the procedure that I have provided informed consent. Date  Time: 10/19/2020 10:33  AM  Pre-Procedure Preparation:  Monitoring: As per clinic protocol. Respiration, ETCO2, SpO2, BP, heart rate and rhythm monitor placed and checked for adequate function Safety Precautions: Patient was assessed for positional comfort and pressure points before starting the procedure. Time-out: I initiated and conducted the "Time-out" before starting the procedure, as per protocol. The patient was asked to participate by confirming the accuracy of the "Time Out" information. Verification of the correct person, site, and procedure were performed and confirmed by me, the nursing staff, and the patient. "Time-out" conducted as per Joint Commission's Universal Protocol (UP.01.01.01). Time: 1100  Description of Procedure:          Area Prepped: Entire shoulder Area DuraPrep (Iodine Povacrylex [0.7% available iodine] and Isopropyl Alcohol, 74% w/w) Safety Precautions: Aspiration looking for blood return was conducted prior to all injections. At no point did we inject any substances, as a needle was being advanced. No attempts were made at seeking any paresthesias. Safe injection practices and needle disposal techniques used. Medications properly checked for expiration dates. SDV (single dose vial) medications used. Description of the Procedure: Protocol guidelines were followed. The patient was placed in position over the procedure table. The target area was identified and the area prepped in the usual manner. Skin & deeper tissues infiltrated with local anesthetic. Appropriate amount of time allowed to pass for local anesthetics to take effect. The procedure needles were then advanced to the target area. Proper needle placement secured. Negative aspiration confirmed. Solution injected in intermittent fashion, asking for systemic symptoms every 0.5cc of injectate. The needles were then removed and the area cleansed, making sure to leave some of the prepping solution back to take advantage of its long term  bactericidal properties.         Vitals:   10/19/20 1120 10/19/20 1124 10/19/20 1134 10/19/20 1143  BP: (!) 153/97 (!) 155/100 (!) 135/105   Pulse: 76     Resp: 18 (!) 23 18 13   Temp:  98.6 F (37 C)  98.3 F (36.8 C)  TempSrc:  Temporal  Temporal  SpO2: 99% 100% 99% 98%  Weight:      Height:        Start Time: 1100 hrs. End Time: 1119 hrs. Materials:  Needle(s) Type: Spinal Needle Gauge: 22G Length: 3.5-in Medication(s): Please see orders for medications and dosing details. 5 cc solution made of 4 cc of 0.2% ropivacaine, 1 cc of methylprednisolone, 40 mg/cc.  Injected into left glenohumeral joint. Imaging Guidance (Non-Spinal):          Type of Imaging Technique: Fluoroscopy Guidance (Non-Spinal) Indication(s): Assistance in needle guidance and placement for procedures requiring needle placement in or near specific anatomical locations not easily accessible without such assistance. Exposure Time: Please see nurses notes. Contrast: Before injecting any contrast, we confirmed that the patient did not have an allergy to iodine, shellfish, or radiological contrast. Once satisfactory needle placement was completed at the desired level, radiological contrast was injected. Contrast injected under live fluoroscopy. No contrast complications. See chart for type and volume of contrast used. Fluoroscopic Guidance: I was personally present during the use of fluoroscopy. "Tunnel Vision Technique" used to obtain the best possible view of the target area. Parallax error corrected before commencing the procedure. "Direction-depth-direction" technique used to introduce the needle under continuous pulsed fluoroscopy. Once target was reached, antero-posterior, oblique, and lateral fluoroscopic projection used confirm needle placement in all planes. Images permanently stored in EMR. Interpretation: I personally interpreted the imaging intraoperatively. Adequate needle placement confirmed in multiple  planes. Appropriate spread of contrast into desired area was observed. No evidence of afferent or efferent intravascular uptake. Permanent images saved into the patient's record.  Post-operative Assessment:  Post-procedure Vital Signs:  Pulse/HCG Rate: 7678 Temp: 98.3 F (36.8 C) Resp: 13 BP: (!) 135/105 (elevated Bp's reported to Dr Holley Raring, patient is symptomatic.) SpO2: 98 %  EBL: None  Complications: No immediate post-treatment complications observed by team, or reported by patient.  Note: The patient tolerated the entire procedure well. A repeat set of vitals were taken after the procedure and the patient was kept under observation following institutional policy, for this type of procedure. Post-procedural neurological assessment was performed, showing return to baseline, prior to discharge. The patient was provided with post-procedure discharge instructions, including a section on how to identify potential problems. Should any problems arise concerning this procedure, the patient was given instructions to immediately contact us, at any time, without hesitation. In any case, we plan to contact the patient by telephone for a follow-up status report regarding this interventional procedure.  Comments:  No additional relevant information.  Plan of Care  Orders:  Orders Placed This Encounter  Procedures  . [facility] C-ARM 1-60 MIN NO REPORT    Intraoperative interpretation by procedural physician at [place].    Standing Status:   Standing    Number of Occurrences:   1    Order Specific Question:   Reason for exam:    Answer:   Assistance in needle guidance and placement for procedures requiring needle placement in or near specific anatomical locations not easily accessible without such assistance.    Medications ordered for procedure: Meds ordered this encounter  Medications  . lidocaine (XYLOCAINE) 2 % (with pres) injection 400 mg  . fentaNYL (SUBLIMAZE)  injection 25-50 mcg    Make sure Narcan is available in the pyxis when using this medication. In the event of respiratory depression (RR< 8/min): Titrate NARCAN (naloxone) in increments of 0.1 to 0.2 mg IV at 2-3 minute intervals, until desired degree of  reversal.  . ropivacaine (PF) 2 mg/mL (0.2%) (NAROPIN) injection 9 mL  . ropivacaine (PF) 2 mg/mL (0.2%) (NAROPIN) injection 9 mL  . dexamethasone (DECADRON) injection 10 mg  . dexamethasone (DECADRON) injection 10 mg  . methylPREDNISolone acetate (DEPO-MEDROL) injection 40 mg  . iohexol (OMNIPAQUE) 180 MG/ML injection 10 mL    Must be Myelogram-compatible. If not available, you may substitute with a water-soluble, non-ionic, hypoallergenic, myelogram-compatible radiological contrast medium.   Medications administered: We administered lidocaine, fentaNYL, ropivacaine (PF) 2 mg/mL (0.2%), ropivacaine (PF) 2 mg/mL (0.2%), dexamethasone, dexamethasone, methylPREDNISolone acetate, and iohexol.  See the medical record for exact dosing, route, and time of administration.  Follow-up plan:   Return for [facility] appt.      s/p IA hyalgan #1 on 08/19/2019, #2 09/23/2019 not very effective unfortunately.  Lumbar MRI shows facet disease at L3, L4, L5 bilaterally.  Bilateral L3, L4, L5 medial branch nerve block #1; right glenohumeral shoulder joint injection (anterior approach) 10/19/2020         Recent Visits Date Type Provider Dept  09/27/20 Office Visit Gillis Santa, MD [facility]  08/24/20 Procedure visit Gillis Santa, MD [facility]  07/28/20 Office Visit Gillis Santa, MD [facility]  Showing recent visits within past 90 days and meeting all other requirements Today's Visits Date Type Provider Dept  10/19/20 Procedure visit Gillis Santa, MD [facility]  Showing today's visits and meeting all other requirements Future Appointments Date Type Provider Dept  11/21/20 Appointment Gillis Santa, MD [facility]  Showing future appointments within next 90 days and meeting all other requirements  Disposition: Discharge home  Discharge (Date  Time): 10/19/2020; 1149 hrs.   Primary Care Physician: Marval Regal, NP Location: [facility] Note by: Gillis Santa, MD Date: 10/19/2020; Time: 1:46 PM  Disclaimer:  Medicine is not an exact science. The only guarantee in medicine is that nothing is guaranteed. It is important to note that the decision to proceed with this intervention was based on the information collected from the patient. The Data and conclusions were drawn from the patient's questionnaire, the interview, and the physical examination. Because the information was provided in large part by the patient, it cannot be guaranteed that it has not been purposely or unconsciously manipulated. Every effort has been made to obtain as much relevant data as possible for this evaluation. It is important to note that the conclusions that lead to this procedure are derived in large part from the available data. Always take into account that the treatment will also be dependent on availability of resources and existing treatment guidelines, considered by other Pain Management Practitioners as being common knowledge and practice, at the time of the intervention. For Medico-Legal purposes, it is also important to point out that variation in procedural techniques and pharmacological choices are the acceptable norm. The indications, contraindications, technique, and results of the above procedure should only be interpreted and judged by a Board-Certified Interventional Pain Specialist with extensive familiarity and expertise in the same exact procedure and technique.

## 2020-10-19 NOTE — Progress Notes (Signed)
Safety precautions to be maintained throughout the outpatient stay will include: orient to surroundings, keep bed in low position, maintain call bell within reach at all times, provide assistance with transfer out of bed and ambulation.  

## 2020-10-20 ENCOUNTER — Telehealth: Payer: Self-pay

## 2020-10-20 NOTE — Telephone Encounter (Signed)
Called patient back to let her know Dr Holley Raring response to redness/rash.  He has recommended benadryl and I have given her those instructions.  She will continue to monitor and verbalizes reassurance.

## 2020-10-20 NOTE — Telephone Encounter (Signed)
She called because she is concerned about her face being red, like a sunburn today. She said please call her on her work number.

## 2020-10-20 NOTE — Telephone Encounter (Signed)
Likely allergic/hypersensitivity response to local anesthetic or steroid. Recommend Diphenhydramine 25 mg every 4 to 6 hours or 50 mg every 6 to 8 hours as needed and continue to monitor sx.

## 2020-10-20 NOTE — Telephone Encounter (Signed)
Patient has called in with a reaction post procedure.  States her face and her chest feel like a sunburn.  Also, there is a whelt coming up on her face about the size of a nickel.  Denies any SOB, no other areas are noted.  States that the face and chest are hot to touch.  I told her that I would talk to DR [facility] and let him know.

## 2020-10-20 NOTE — Telephone Encounter (Signed)
Post procedure phone call.  LM 

## 2020-10-20 NOTE — Telephone Encounter (Signed)
Patient is calling back saying the redness is spreading now to her chest and looks like hives

## 2020-10-28 ENCOUNTER — Other Ambulatory Visit: Payer: Self-pay

## 2020-10-28 ENCOUNTER — Encounter: Payer: Self-pay | Admitting: Nurse Practitioner

## 2020-10-28 ENCOUNTER — Ambulatory Visit (INDEPENDENT_AMBULATORY_CARE_PROVIDER_SITE_OTHER): Payer: 59 | Admitting: Nurse Practitioner

## 2020-10-28 VITALS — BP 114/78 | HR 92 | Temp 98.2°F | Ht 68.0 in | Wt 216.0 lb

## 2020-10-28 DIAGNOSIS — R7303 Prediabetes: Secondary | ICD-10-CM

## 2020-10-28 DIAGNOSIS — G894 Chronic pain syndrome: Secondary | ICD-10-CM | POA: Diagnosis not present

## 2020-10-28 DIAGNOSIS — Z1211 Encounter for screening for malignant neoplasm of colon: Secondary | ICD-10-CM

## 2020-10-28 DIAGNOSIS — Z6832 Body mass index (BMI) 32.0-32.9, adult: Secondary | ICD-10-CM

## 2020-10-28 DIAGNOSIS — Z85828 Personal history of other malignant neoplasm of skin: Secondary | ICD-10-CM

## 2020-10-28 DIAGNOSIS — Z79899 Other long term (current) drug therapy: Secondary | ICD-10-CM | POA: Insufficient documentation

## 2020-10-28 DIAGNOSIS — Z0000 Encounter for general adult medical examination without abnormal findings: Secondary | ICD-10-CM

## 2020-10-28 DIAGNOSIS — E785 Hyperlipidemia, unspecified: Secondary | ICD-10-CM

## 2020-10-28 DIAGNOSIS — K219 Gastro-esophageal reflux disease without esophagitis: Secondary | ICD-10-CM

## 2020-10-28 HISTORY — DX: Personal history of other malignant neoplasm of skin: Z85.828

## 2020-10-28 HISTORY — DX: Encounter for general adult medical examination without abnormal findings: Z00.00

## 2020-10-28 HISTORY — DX: Body mass index (BMI) 32.0-32.9, adult: Z68.32

## 2020-10-28 LAB — CBC WITH DIFFERENTIAL/PLATELET
Basophils Absolute: 0 10*3/uL (ref 0.0–0.1)
Basophils Relative: 0.8 % (ref 0.0–3.0)
Eosinophils Absolute: 0.1 10*3/uL (ref 0.0–0.7)
Eosinophils Relative: 1.2 % (ref 0.0–5.0)
HCT: 41.6 % (ref 36.0–46.0)
Hemoglobin: 13.8 g/dL (ref 12.0–15.0)
Lymphocytes Relative: 23.5 % (ref 12.0–46.0)
Lymphs Abs: 1.5 10*3/uL (ref 0.7–4.0)
MCHC: 33.1 g/dL (ref 30.0–36.0)
MCV: 82.8 fl (ref 78.0–100.0)
Monocytes Absolute: 0.5 10*3/uL (ref 0.1–1.0)
Monocytes Relative: 7.9 % (ref 3.0–12.0)
Neutro Abs: 4.2 10*3/uL (ref 1.4–7.7)
Neutrophils Relative %: 66.6 % (ref 43.0–77.0)
Platelets: 285 10*3/uL (ref 150.0–400.0)
RBC: 5.02 Mil/uL (ref 3.87–5.11)
RDW: 14.4 % (ref 11.5–15.5)
WBC: 6.3 10*3/uL (ref 4.0–10.5)

## 2020-10-28 LAB — COMPREHENSIVE METABOLIC PANEL
ALT: 28 U/L (ref 0–35)
AST: 17 U/L (ref 0–37)
Albumin: 4 g/dL (ref 3.5–5.2)
Alkaline Phosphatase: 98 U/L (ref 39–117)
BUN: 9 mg/dL (ref 6–23)
CO2: 30 mEq/L (ref 19–32)
Calcium: 9.7 mg/dL (ref 8.4–10.5)
Chloride: 103 mEq/L (ref 96–112)
Creatinine, Ser: 0.79 mg/dL (ref 0.40–1.20)
GFR: 83.15 mL/min (ref >60.00)
Glucose, Bld: 93 mg/dL (ref 70–99)
Potassium: 4.8 mEq/L (ref 3.5–5.1)
Sodium: 140 mEq/L (ref 135–145)
Total Bilirubin: 0.5 mg/dL (ref 0.2–1.2)
Total Protein: 6.7 g/dL (ref 6.0–8.3)

## 2020-10-28 LAB — LIPID PANEL
Cholesterol: 159 mg/dL (ref 0–200)
HDL: 42.8 mg/dL (ref >39.00)
LDL Cholesterol: 92 mg/dL (ref 0–99)
NonHDL: 115.78
Total CHOL/HDL Ratio: 4
Triglycerides: 121 mg/dL (ref 0.0–149.0)
VLDL: 24.2 mg/dL (ref 0.0–40.0)

## 2020-10-28 LAB — VITAMIN D 25 HYDROXY (VIT D DEFICIENCY, FRACTURES): VITD: 41.01 ng/mL (ref 30.00–100.00)

## 2020-10-28 LAB — B12 AND FOLATE PANEL
Folate: >23.6 ng/mL (ref >5.9)
Vitamin B-12: 608 pg/mL (ref 211–911)

## 2020-10-28 LAB — TSH: TSH: 0.75 u[IU]/mL (ref 0.35–4.50)

## 2020-10-28 LAB — HEMOGLOBIN A1C: Hgb A1c MFr Bld: 5.9 % (ref 4.6–6.5)

## 2020-10-28 NOTE — Progress Notes (Signed)
Established Patient Office Visit  Subjective:  Patient ID: [name], female    DOB: 12-17-1961  Age: 59 y.o. MRN: [id number]  CC:  Chief Complaint  Patient presents with  . Annual Exam    CPE no pap    HPI [name] is a 59 yo who presents for a complete physical.  Skin cancer:  She has just completed 5 days of topical cream for superficial skin cancer on her face followed by [facility] dermatology.  She is going to be applying a moisturizer cream to help decrease the side effects.  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  She grew up in [place] with excessive sun exposure.  BMI 32/Obesity: She would like to have check for diabetes since her son has type 1 diabetes, and her father developed diabetes diet managed in his 42s.    Wt Readings from Last 3 Encounters:  10/28/20 216 lb (98 kg)  10/19/20 210 lb (95.3 kg)  09/30/20 218 lb (98.9 kg)    She had concerns about elevated BP with dx of HTN: Normal BP today  BP Readings from Last 3 Encounters:  10/28/20 114/78  10/19/20 (!) 135/105  09/30/20 136/82   GERD: Still needs PPI daily or gets bad heartburn. No dysphagia.   Immunizations: No Covid vaccine- just got injections in back and shoulder and had to wait-Tdap done 2019 Diet:Healthy diet Exercise: Walking more frequently Colonoscopy: Done in 2011 for IDA work up- needs repeated for screening and wants OV to discuss a possible  weak anal sphincter. Coughs/ sneezes- can't control gas and stool urgency . No fecal incontinence.  EGD: 2011- would like done again at time of colonoscopy   Dexa: Not yet Pap Smear:  August 2020 by GYN Mammogram: GYN- UTD Dentist: UTD Vision: Not done- recommend ETOH: none Smoking: Quit 17 yrs ago 1/2 ppd for 20 10 pack HX   Past Medical History:  Diagnosis Date  . Arthritis   . Degenerative disorder of bone   . Genuine  stress incontinence, female 2012   patient had surgical repair  . GERD (gastroesophageal reflux disease)   . Migraines   . Obesity     Past Surgical History:  Procedure Laterality Date  . excision of vulvar lesion  05/02/2011  . LAPAROSCOPIC HYSTERECTOMY  10/23/2010   mennorhagia/fibroids  . Midurethral sling  05/02/2011    Family History  Problem Relation Age of Onset  . Heart attack Mother   . Hypertension Mother   . Arthritis Mother   . Cancer Father        skin  . Diabetes Son   . Arthritis Maternal Grandmother   . Heart attack Maternal Grandfather   . Heart attack Paternal Grandmother   . Cancer Paternal Grandfather        ear    Social History   Socioeconomic History  . Marital status: Legally Separated    Spouse name: Not on file  . Number of children: Not on file  . Years of education: Not on file  . Highest education level: Not on file  Occupational History  . Not on file  Tobacco Use  . Smoking status: Former Smoker    Types: Cigarettes    Quit date: 01/28/2003    Years since quitting: 17.7  . Smokeless tobacco: Never Used  Vaping Use  . Vaping Use: Never used  Substance and Sexual Activity  . Alcohol use: Not Currently    Comment: socially  . Drug use: No  . Sexual activity: Yes    Partners: Male    Birth control/protection: Surgical  Other Topics Concern  . Not on file  Social History Narrative  . Not on file   Social Determinants of Health   Financial Resource Strain:   . Difficulty of Paying Living Expenses: Not on file  Food Insecurity:   . Worried About Charity fundraiser in the Last Year: Not on file  . Ran Out of Food in the Last Year: Not on file  Transportation Needs:   . Lack of Transportation (Medical): Not on file  . Lack of Transportation (Non-Medical): Not on file  Physical Activity:   . Days of Exercise per Week: Not on file  . Minutes of Exercise per Session: Not on file  Stress:   . Feeling of Stress : Not on file    Social Connections:   . Frequency of Communication with Friends and Family: Not on file  . Frequency of Social Gatherings with Friends and Family: Not on file  . Attends Religious Services: Not on file  . Active Member of Clubs or Organizations: Not on file  . Attends Archivist Meetings: Not on file  . Marital Status: Not on file  Intimate Partner Violence:   . Fear of Current or Ex-Partner: Not on file  . Emotionally Abused: Not on file  . Physically Abused: Not on file  . Sexually Abused: Not on file    Outpatient Medications Prior to Visit  Medication Sig Dispense Refill  . DULoxetine (CYMBALTA) 20 MG capsule Take 2 capsules (40 mg total) by mouth daily. 60 capsule 3  . gabapentin (NEURONTIN) 300 MG capsule Take 1 capsule (300 mg total) by mouth 2 (two) times daily. 60 capsule 3  . Melatonin 5 MG CAPS Take 1 capsule by mouth daily.    . Multiple Vitamin (MULTIVITAMIN) tablet Take 1 tablet by mouth daily.    Marland Kitchen omeprazole (PRILOSEC) 20 MG capsule TAKE 1 CAPSULE BY MOUTH EVERY DAY 90 capsule 1  . valACYclovir (VALTREX) 500 MG tablet Take 500 mg by mouth as needed.      No facility-administered medications prior to visit.    No Known Allergies  ROS Review of Systems  Eyes: Negative.   Respiratory: Positive for cough. Negative for shortness of breath.        2 weeks ago and better  Cardiovascular: Negative for chest pain, palpitations and leg swelling.  Gastrointestinal: Negative.   Endocrine: Negative.   Genitourinary: Negative.   Musculoskeletal: Negative.   Allergic/Immunologic: Negative.   Neurological: Negative.   Hematological: Negative.   Psychiatric/Behavioral:       No concerns about depression or anxiety.  PHQ-9: 0, GAD-7: 5      Objective:    Physical Exam Vitals reviewed.  Constitutional:      Appearance: She is obese.  HENT:     Head: Normocephalic and atraumatic.     Right Ear: Tympanic membrane normal.     Left Ear: Tympanic membrane  normal.  Eyes:     Conjunctiva/sclera: Conjunctivae normal.     Pupils: Pupils are equal, round, and reactive to light.  Cardiovascular:     Rate and Rhythm: Normal rate and regular rhythm.     Pulses: Normal pulses.     Heart sounds: Normal heart sounds.  Pulmonary:     Effort: Pulmonary  effort is normal.     Breath sounds: Normal breath sounds.  Abdominal:     Palpations: Abdomen is soft.     Tenderness: There is no abdominal tenderness.  Musculoskeletal:     Cervical back: Normal range of motion.  Skin:    General: Skin is warm and dry.     Findings: Erythema present.     Comments: Facial erythema from topical CTX per DERM   Neurological:     General: No focal deficit present.     Mental Status: She is alert and oriented to person, place, and time.  Psychiatric:        Mood and Affect: Mood normal.        Behavior: Behavior normal.     BP 114/78 (BP Location: Left Arm, Patient Position: Sitting, Cuff Size: Normal)   Pulse 92   Temp 98.2 F (36.8 C) (Oral)   Ht 5\' 8"  (1.727 m)   Wt 216 lb (98 kg)   SpO2 98%   BMI 32.84 kg/m  Wt Readings from Last 3 Encounters:  10/28/20 216 lb (98 kg)  10/19/20 210 lb (95.3 kg)  09/30/20 218 lb (98.9 kg)     Health Maintenance Due  Topic Date Due  . COLONOSCOPY  05/19/2019  . MAMMOGRAM  07/10/2020  . INFLUENZA VACCINE  07/17/2020    There are no preventive care reminders to display for this patient.  Lab Results  Component Value Date   TSH 0.75 10/28/2020   Lab Results  Component Value Date   WBC 6.3 10/28/2020   HGB 13.8 10/28/2020   HCT 41.6 10/28/2020   MCV 82.8 10/28/2020   PLT 285.0 10/28/2020   Lab Results  Component Value Date   NA 140 10/28/2020   K 4.8 10/28/2020   CO2 30 10/28/2020   GLUCOSE 93 10/28/2020   BUN 9 10/28/2020   CREATININE 0.79 10/28/2020   BILITOT 0.5 10/28/2020   ALKPHOS 98 10/28/2020   AST 17 10/28/2020   ALT 28 10/28/2020   PROT 6.7 10/28/2020   ALBUMIN 4.0 10/28/2020    CALCIUM 9.7 10/28/2020   ANIONGAP 5 05/15/2016   GFR 83.15 10/28/2020   Lab Results  Component Value Date   CHOL 159 10/28/2020   Lab Results  Component Value Date   HDL 42.80 10/28/2020   Lab Results  Component Value Date   LDLCALC 92 10/28/2020   Lab Results  Component Value Date   TRIG 121.0 10/28/2020   Lab Results  Component Value Date   CHOLHDL 4 10/28/2020   Lab Results  Component Value Date   HGBA1C 5.9 10/28/2020      Assessment & Plan:   Problem List Items Addressed This Visit      Digestive   GERD (gastroesophageal reflux disease)   Relevant Orders   Ambulatory referral to Gastroenterology     Other   Chronic pain syndrome   Adult BMI 32.0-32.9 kg/sq m - Primary   Relevant Orders   TSH (Completed)   Comprehensive metabolic panel (Completed)   Hemoglobin A1c (Completed)   Lipid panel (Completed)   History of squamous cell carcinoma of skin   Well woman exam without gynecological exam   Relevant Orders   CBC with Differential/Platelet (Completed)   VITAMIN D 25 Hydroxy (Vit-D Deficiency, Fractures) (Completed)   B12 and Folate Panel (Completed)    Other Visit Diagnoses    Colon cancer screening       Relevant Orders   Ambulatory referral to  Gastroenterology     Please go to the lab today for routine physical exam labs.  Talk to pain management about when you can get the Covid vaccine after back injections.  You are due for flu vaccine as well.  Lastly, you are also due for the shingles vaccine can get that through pharmacy.  This is called Shingrix and is a 2 part vaccine .  I put a referral into GI for a screening colonoscopy and to explore your chronic reflux and need for omeprazole.  Please continue to follow-up with gynecology and is set up that mammogram as soon as you can.  The breast center may ask you about when you had the Covid vaccine in relationship to the timing of the mammogram as some women have had lymph node swelling  after Covid infection or vaccine.  Talk to the mammography scheduler about this when you set this up.   Advise a  diet rich in lean protein, nonfat low-fat dairy, vegetables, fruits, complex carbohydrates with aerobic exercise  goal of 30 min x 5 times a week and add in twice weekly weight resistance training.  Recommend eye exam as you have not had one in a while.  Follow-up: Return in about 6 months (around 04/27/2021).   This visit occurred during the SARS-CoV-2 public health emergency.  Safety protocols were in place, including screening questions prior to the visit, additional usage of staff PPE, and extensive cleaning of exam room while observing appropriate contact time as indicated for disinfecting solutions.    Denice Paradise, NP

## 2020-10-28 NOTE — Patient Instructions (Addendum)
Please go to the lab today for routine physical exam labs.  Talk to pain management about when you can get the Covid vaccine after back injections.  You are due for flu vaccine as well.  Lastly, you are also due for the shingles vaccine can get that through pharmacy.  This is called Shingrix and is a 2 part vaccine .  I put a referral into GI for a screening colonoscopy and to explore your chronic reflux and need for omeprazole.  Please continue to follow-up with gynecology and is set up that mammogram as soon as you can.  The breast center may ask you about when you had the Covid vaccine in relationship to the timing of the mammogram as some women have had lymph node swelling after Covid infection or vaccine.  Talk to the mammography scheduler about this when you set this up.   Advise a  diet rich in lean protein, nonfat low-fat dairy, vegetables, fruits, complex carbohydrates with aerobic exercise  goal of 30 min x 5 times a week and add in twice weekly weight resistance training.  Recommend eye exam as you have not had one in a while.   Preventive Care 57-61 Years Old, Female Preventive care refers to visits with your health care provider and lifestyle choices that can promote health and wellness. This includes:  A yearly physical exam. This may also be called an annual well check.  Regular dental visits and eye exams.  Immunizations.  Screening for certain conditions.  Healthy lifestyle choices, such as eating a healthy diet, getting regular exercise, not using drugs or products that contain nicotine and tobacco, and limiting alcohol use. What can I expect for my preventive care visit? Physical exam Your health care provider will check your:  Height and weight. This may be used to calculate body mass index (BMI), which tells if you are at a healthy weight.  Heart rate and blood pressure.  Skin for abnormal spots. Counseling Your health care provider may ask you questions  about your:  Alcohol, tobacco, and drug use.  Emotional well-being.  Home and relationship well-being.  Sexual activity.  Eating habits.  Work and work Statistician.  Method of birth control.  Menstrual cycle.  Pregnancy history. What immunizations do I need?  Influenza (flu) vaccine  This is recommended every year. Tetanus, diphtheria, and pertussis (Tdap) vaccine  You may need a Td booster every 10 years. Varicella (chickenpox) vaccine  You may need this if you have not been vaccinated. Zoster (shingles) vaccine  You may need this after age 56. Measles, mumps, and rubella (MMR) vaccine  You may need at least one dose of MMR if you were born in 1957 or later. You may also need a second dose. Pneumococcal conjugate (PCV13) vaccine  You may need this if you have certain conditions and were not previously vaccinated. Pneumococcal polysaccharide (PPSV23) vaccine  You may need one or two doses if you smoke cigarettes or if you have certain conditions. Meningococcal conjugate (MenACWY) vaccine  You may need this if you have certain conditions. Hepatitis A vaccine  You may need this if you have certain conditions or if you travel or work in places where you may be exposed to hepatitis A. Hepatitis B vaccine  You may need this if you have certain conditions or if you travel or work in places where you may be exposed to hepatitis B. Haemophilus influenzae type b (Hib) vaccine  You may need this if you have certain  conditions. Human papillomavirus (HPV) vaccine  If recommended by your health care provider, you may need three doses over 6 months. You may receive vaccines as individual doses or as more than one vaccine together in one shot (combination vaccines). Talk with your health care provider about the risks and benefits of combination vaccines. What tests do I need? Blood tests  Lipid and cholesterol levels. These may be checked every 5 years, or more  frequently if you are over 67 years old.  Hepatitis C test.  Hepatitis B test. Screening  Lung cancer screening. You may have this screening every year starting at age 70 if you have a 30-pack-year history of smoking and currently smoke or have quit within the past 15 years.  Colorectal cancer screening. All adults should have this screening starting at age 31 and continuing until age 67. Your health care provider may recommend screening at age 41 if you are at increased risk. You will have tests every 1-10 years, depending on your results and the type of screening test.  Diabetes screening. This is done by checking your blood sugar (glucose) after you have not eaten for a while (fasting). You may have this done every 1-3 years.  Mammogram. This may be done every 1-2 years. Talk with your health care provider about when you should start having regular mammograms. This may depend on whether you have a family history of breast cancer.  BRCA-related cancer screening. This may be done if you have a family history of breast, ovarian, tubal, or peritoneal cancers.  Pelvic exam and Pap test. This may be done every 3 years starting at age 2. Starting at age 32, this may be done every 5 years if you have a Pap test in combination with an HPV test. Other tests  Sexually transmitted disease (STD) testing.  Bone density scan. This is done to screen for osteoporosis. You may have this scan if you are at high risk for osteoporosis. Follow these instructions at home: Eating and drinking  Eat a diet that includes fresh fruits and vegetables, whole grains, lean protein, and low-fat dairy.  Take vitamin and mineral supplements as recommended by your health care provider.  Do not drink alcohol if: ? Your health care provider tells you not to drink. ? You are pregnant, may be pregnant, or are planning to become pregnant.  If you drink alcohol: ? Limit how much you have to 0-1 drink a day. ? Be aware  of how much alcohol is in your drink. In the [place] one drink equals one 12 oz bottle of beer (355 mL), one 5 oz glass of wine (148 mL), or one 1 oz glass of hard liquor (44 mL). Lifestyle  Take daily care of your teeth and gums.  Stay active. Exercise for at least 30 minutes on 5 or more days each week.  Do not use any products that contain nicotine or tobacco, such as cigarettes, e-cigarettes, and chewing tobacco. If you need help quitting, ask your health care provider.  If you are sexually active, practice safe sex. Use a condom or other form of birth control (contraception) in order to prevent pregnancy and STIs (sexually transmitted infections).  If told by your health care provider, take low-dose aspirin daily starting at age 42. What's next?  Visit your health care provider once a year for a well check visit.  Ask your health care provider how often you should have your eyes and teeth checked.  Stay up to date  on all vaccines. This information is not intended to replace advice given to you by your health care provider. Make sure you discuss any questions you have with your health care provider. Document Revised: 08/14/2018 Document Reviewed: 08/14/2018 Elsevier Patient Education  2020 [place] for Gastroesophageal Reflux Disease, Adult When you have gastroesophageal reflux disease (GERD), the foods you eat and your eating habits are very important. Choosing the right foods can help ease your discomfort. Think about working with a nutrition specialist (dietitian) to help you make good choices. What are tips for following this plan?  Meals  Choose healthy foods that are low in fat, such as fruits, vegetables, whole grains, low-fat dairy products, and lean meat, fish, and poultry.  Eat small meals often instead of 3 large meals a day. Eat your meals slowly, and in a place where you are relaxed. Avoid bending over or lying down until 2-3 hours after  eating.  Avoid eating meals 2-3 hours before bed.  Avoid drinking a lot of liquid with meals.  Cook foods using methods other than frying. Bake, grill, or broil food instead.  Avoid or limit: ? Chocolate. ? Peppermint or spearmint. ? Alcohol. ? Pepper. ? Black and decaffeinated coffee. ? Black and decaffeinated tea. ? Bubbly (carbonated) soft drinks. ? Caffeinated energy drinks and soft drinks.  Limit high-fat foods such as: ? Fatty meat or fried foods. ? Whole milk, cream, butter, or ice cream. ? Nuts and nut butters. ? Pastries, donuts, and sweets made with butter or shortening.  Avoid foods that cause symptoms. These foods may be different for everyone. Common foods that cause symptoms include: ? Tomatoes. ? Oranges, lemons, and limes. ? Peppers. ? Spicy food. ? Onions and garlic. ? Vinegar. Lifestyle  Maintain a healthy weight. Ask your doctor what weight is healthy for you. If you need to lose weight, work with your doctor to do so safely.  Exercise for at least 30 minutes for 5 or more days each week, or as told by your doctor.  Wear loose-fitting clothes.  Do not smoke. If you need help quitting, ask your doctor.  Sleep with the head of your bed higher than your feet. Use a wedge under the mattress or blocks under the bed frame to raise the head of the bed. Summary  When you have gastroesophageal reflux disease (GERD), food and lifestyle choices are very important in easing your symptoms.  Eat small meals often instead of 3 large meals a day. Eat your meals slowly, and in a place where you are relaxed.  Limit high-fat foods such as fatty meat or fried foods.  Avoid bending over or lying down until 2-3 hours after eating.  Avoid peppermint and spearmint, caffeine, alcohol, and chocolate. This information is not intended to replace advice given to you by your health care provider. Make sure you discuss any questions you have with your health care  provider. Document Revised: 03/26/2019 Document Reviewed: 01/08/2017 Elsevier Patient Education  [place].  Gastroesophageal Reflux Disease, Adult Gastroesophageal reflux (GER) happens when acid from the stomach flows up into the tube that connects the mouth and the stomach (esophagus). Normally, food travels down the esophagus and stays in the stomach to be digested. However, when a person has GER, food and stomach acid sometimes move back up into the esophagus. If this becomes a more serious problem, the person may be diagnosed with a disease called gastroesophageal reflux disease (GERD). GERD occurs when the reflux:  Happens often.  Causes frequent or severe symptoms.  Causes problems such as damage to the esophagus. When stomach acid comes in contact with the esophagus, the acid may cause soreness (inflammation) in the esophagus. Over time, GERD may create small holes (ulcers) in the lining of the esophagus. What are the causes? This condition is caused by a problem with the muscle between the esophagus and the stomach (lower esophageal sphincter, or LES). Normally, the LES muscle closes after food passes through the esophagus to the stomach. When the LES is weakened or abnormal, it does not close properly, and that allows food and stomach acid to go back up into the esophagus. The LES can be weakened by certain dietary substances, medicines, and medical conditions, including:  Tobacco use.  Pregnancy.  Having a hiatal hernia.  Alcohol use.  Certain foods and beverages, such as coffee, chocolate, onions, and peppermint. What increases the risk? You are more likely to develop this condition if you:  Have an increased body weight.  Have a connective tissue disorder.  Use NSAID medicines. What are the signs or symptoms? Symptoms of this condition include:  Heartburn.  Difficult or painful swallowing.  The feeling of having a lump in the throat.  Abitter taste in  the mouth.  Bad breath.  Having a large amount of saliva.  Having an upset or bloated stomach.  Belching.  Chest pain. Different conditions can cause chest pain. Make sure you see your health care provider if you experience chest pain.  Shortness of breath or wheezing.  Ongoing (chronic) cough or a night-time cough.  Wearing away of tooth enamel.  Weight loss. How is this diagnosed? Your health care provider will take a medical history and perform a physical exam. To determine if you have mild or severe GERD, your health care provider may also monitor how you respond to treatment. You may also have tests, including:  A test to examine your stomach and esophagus with a small camera (endoscopy).  A test thatmeasures the acidity level in your esophagus.  A test thatmeasures how much pressure is on your esophagus.  A barium swallow or modified barium swallow test to show the shape, size, and functioning of your esophagus. How is this treated? The goal of treatment is to help relieve your symptoms and to prevent complications. Treatment for this condition may vary depending on how severe your symptoms are. Your health care provider may recommend:  Changes to your diet.  Medicine.  Surgery. Follow these instructions at home: Eating and drinking   Follow a diet as recommended by your health care provider. This may involve avoiding foods and drinks such as: ? Coffee and tea (with or without caffeine). ? Drinks that containalcohol. ? Energy drinks and sports drinks. ? Carbonated drinks or sodas. ? Chocolate and cocoa. ? Peppermint and mint flavorings. ? Garlic and onions. ? Horseradish. ? Spicy and acidic foods, including peppers, chili powder, curry powder, vinegar, hot sauces, and barbecue sauce. ? Citrus fruit juices and citrus fruits, such as oranges, lemons, and limes. ? Tomato-based foods, such as red sauce, chili, salsa, and pizza with red sauce. ? Fried and  fatty foods, such as donuts, french fries, potato chips, and high-fat dressings. ? High-fat meats, such as hot dogs and fatty cuts of red and white meats, such as rib eye steak, sausage, ham, and bacon. ? High-fat dairy items, such as whole milk, butter, and cream cheese.  Eat small, frequent meals instead of large meals.  Avoid drinking large amounts of liquid with your meals.  Avoid eating meals during the 2-3 hours before bedtime.  Avoid lying down right after you eat.  Do not exercise right after you eat. Lifestyle   Do not use any products that contain nicotine or tobacco, such as cigarettes, e-cigarettes, and chewing tobacco. If you need help quitting, ask your health care provider.  Try to reduce your stress by using methods such as yoga or meditation. If you need help reducing stress, ask your health care provider.  If you are overweight, reduce your weight to an amount that is healthy for you. Ask your health care provider for guidance about a safe weight loss goal. General instructions  Pay attention to any changes in your symptoms.  Take over-the-counter and prescription medicines only as told by your health care provider. Do not take aspirin, ibuprofen, or other NSAIDs unless your health care provider told you to do so.  Wear loose-fitting clothing. Do not wear anything tight around your waist that causes pressure on your abdomen.  Raise (elevate) the head of your bed about 6 inches (15 cm).  Avoid bending over if this makes your symptoms worse.  Keep all follow-up visits as told by your health care provider. This is important. Contact a health care provider if:  You have: ? New symptoms. ? Unexplained weight loss. ? Difficulty swallowing or it hurts to swallow. ? Wheezing or a persistent cough. ? A hoarse voice.  Your symptoms do not improve with treatment. Get help right away if you:  Have pain in your arms, neck, jaw, teeth, or back.  Feel sweaty, dizzy,  or light-headed.  Have chest pain or shortness of breath.  Vomit and your vomit looks like blood or coffee grounds.  Faint.  Have stool that is bloody or black.  Cannot swallow, drink, or eat. Summary  Gastroesophageal reflux happens when acid from the stomach flows up into the esophagus. GERD is a disease in which the reflux happens often, causes frequent or severe symptoms, or causes problems such as damage to the esophagus.  Treatment for this condition may vary depending on how severe your symptoms are. Your health care provider may recommend diet and lifestyle changes, medicine, or surgery.  Contact a health care provider if you have new or worsening symptoms.  Take over-the-counter and prescription medicines only as told by your health care provider. Do not take aspirin, ibuprofen, or other NSAIDs unless your health care provider told you to do so.  Keep all follow-up visits as told by your health care provider. This is important. This information is not intended to replace advice given to you by your health care provider. Make sure you discuss any questions you have with your health care provider. Document Revised: 06/11/2018 Document Reviewed: 06/11/2018 Elsevier Patient Education  [place].

## 2020-11-07 NOTE — Addendum Note (Signed)
Addended by: Denice Paradise A on: 11/07/2020 07:13 AM   Modules accepted: Orders

## 2020-11-08 NOTE — Progress Notes (Signed)
Per previous note- that number was the fax- the GI phone number is Phone:  [phone]

## 2020-11-08 NOTE — Progress Notes (Signed)
GI has tried to reach her. Does she still want to see them for f/up colonoscopy? If so, please call them back [phone].

## 2020-11-21 ENCOUNTER — Ambulatory Visit
Payer: 59 | Attending: Student in an Organized Health Care Education/Training Program | Admitting: Student in an Organized Health Care Education/Training Program

## 2020-11-21 ENCOUNTER — Other Ambulatory Visit: Payer: Self-pay

## 2020-11-21 ENCOUNTER — Encounter: Payer: Self-pay | Admitting: Student in an Organized Health Care Education/Training Program

## 2020-11-21 VITALS — BP 133/95 | HR 80 | Temp 97.0°F | Resp 15 | Ht 68.0 in | Wt 215.0 lb

## 2020-11-21 DIAGNOSIS — M170 Bilateral primary osteoarthritis of knee: Secondary | ICD-10-CM | POA: Diagnosis present

## 2020-11-21 DIAGNOSIS — M5416 Radiculopathy, lumbar region: Secondary | ICD-10-CM | POA: Diagnosis present

## 2020-11-21 DIAGNOSIS — M12811 Other specific arthropathies, not elsewhere classified, right shoulder: Secondary | ICD-10-CM | POA: Diagnosis present

## 2020-11-21 DIAGNOSIS — M47816 Spondylosis without myelopathy or radiculopathy, lumbar region: Secondary | ICD-10-CM | POA: Diagnosis present

## 2020-11-21 DIAGNOSIS — M75101 Unspecified rotator cuff tear or rupture of right shoulder, not specified as traumatic: Secondary | ICD-10-CM | POA: Diagnosis present

## 2020-11-21 DIAGNOSIS — G894 Chronic pain syndrome: Secondary | ICD-10-CM

## 2020-11-21 MED ORDER — HYDROCODONE-ACETAMINOPHEN 5-325 MG PO TABS: 1.0000 | ORAL_TABLET | Freq: Four times a day (QID) | ORAL | 0 refills | Status: AC | PRN

## 2020-11-21 NOTE — Progress Notes (Signed)
PROVIDER NOTE: Information contained herein reflects review and annotations entered in association with encounter. Interpretation of such information and data should be left to medically-trained personnel. Information provided to patient can be located elsewhere in the medical record under "Patient Instructions". Document created using STT-dictation technology, any transcriptional errors that may result from process are unintentional.    Patient: [name]  Service Category: E/M  Provider: Gillis Santa, MD  DOB: 1961/03/19  DOS: 11/21/2020  Specialty: Interventional Pain Management  MRN: [id number]  Setting: Ambulatory outpatient  PCP: Marval Regal, NP  Type: Established Patient    Referring Provider: Marval Regal, NP  Location: Office  Delivery: Face-to-face     HPI  [name], a 59 y.o. year old female, is here today because of her Lumbar spondylosis [M47.816]. [name] primary complain today is Shoulder Pain and Back Pain (lower) Last encounter: My last encounter with her was on 10/19/2020. Pertinent problems: [name] has Obesity; Arthritis; Chronic pain syndrome; Lumbar spondylosis; Bilateral primary osteoarthritis of knee; Bilateral hip pain; Chronic SI joint pain; Lumbar radiculopathy; and Long-term current use of opiate analgesic on their pertinent problem list. Pain Assessment: Severity of Chronic pain is reported as a 5  (back; shoulder 0)/10. Location: Shoulder (lower back)  /Back pain travels to right hip. Onset: More than a month ago. Quality:  . Timing:  . Modifying factor(s): procedure. Vitals:  height is _0  (1.727 m) and weight is 215 lb (97.5 kg). Her temporal temperature is 97 F (36.1 C) (abnormal). Her blood pressure is 133/95 (abnormal) and her pulse is 80. Her respiration is 15 and oxygen saturation is 100%.   Reason for encounter: both, medication management and post-procedure assessment.    Post-Procedure Evaluation  Procedure (10/19/2020):    Right anterior glenohumeral steroid injection, bilateral L3, L4, L5 facet medial branch nerve block  Sedation: Please see nurses note.  Effectiveness during initial hour after procedure(Ultra-Short Term Relief): 100 %   Local anesthetic used: Long-acting (4-6 hours) Effectiveness: Defined as any analgesic benefit obtained secondary to the administration of local anesthetics. This carries significant diagnostic value as to the etiological location, or anatomical origin, of the pain. Duration of benefit is expected to coincide with the duration of the local anesthetic used.  Effectiveness during initial 4-6 hours after procedure(Short-Term Relief): 100 %   Long-term benefit: Defined as any relief past the pharmacologic duration of the local anesthetics.  Effectiveness past the initial 6 hours after procedure(Long-Term Relief):  (shoulder 100% relief; Back 40% better)   Current benefits: Defined as benefit that persist at this time.   Analgesia:  100% pain relief for right shoulder, 40 to 50% pain relief for low back pain Function: [name] reports improvement in function ROM: [name] reports improvement in ROM   Pharmacotherapy Assessment   Analgesic: hydrocodone 5 mg twice daily as needed, quantity 60/month    Monitoring: [place] PMP: PDMP reviewed during this encounter.       Pharmacotherapy: No side-effects or adverse reactions reported. Compliance: No problems identified. Effectiveness: Clinically acceptable.  [name], RN  11/21/2020  3:14 PM  Sign when Signing Visit Nursing Pain Medication Assessment:  Safety precautions to be maintained throughout the outpatient stay will include: orient to surroundings, keep bed in low position, maintain call bell within reach at all times, provide assistance with transfer out of bed and ambulation.  Medication Inspection Compliance: [name] did not comply with our request to bring her pills to be  counted. She was reminded that bringing  the medication bottles, even when empty, is a requirement.  Medication: None brought in. Pill/Patch Count: None available to be counted. Bottle Appearance: No container available. Did not bring bottle(s) to appointment. Filled Date: N/A Last Medication intake:  Today   Pt instructed to bring Hydrocodone container to all appts.    UDS:  Summary  Date Value Ref Range Status  05/18/2020 Note  Final    Comment:    ==================================================================== [facility] (MW) ==================================================================== Test                             Result       Flag       Units Drug Present and Declared for Prescription Verification   Hydrocodone                    223          EXPECTED   ng/mg creat   Norhydrocodone                 487          EXPECTED   ng/mg creat    Sources of hydrocodone include scheduled prescription medications.    Norhydrocodone is an expected metabolite of hydrocodone. ==================================================================== Test                      Result    Flag   Units      Ref Range   Creatinine              31               mg/dL      >=20 ==================================================================== Declared Medications:  The flagging and interpretation on this report are based on the  following declared medications.  Unexpected results may arise from  inaccuracies in the declared medications.  **Note: The testing scope of this panel includes these medications:  Hydrocodone (Norco)  **Note: The testing scope of this panel does not include the  following reported medications:  Acetaminophen (Norco)  Azithromycin (Zithromax)  Duloxetine (Cymbalta)  Gabapentin (Neurontin)  Melatonin  Naproxen (Aleve)  Omeprazole (Prilosec)  Ondansetron (Zofran)  Topical  Turmeric  Valacyclovir (Valtrex) ==================================================================== For  clinical consultation, please call [phone]. ====================================================================      ROS  Constitutional: Denies any fever or chills Gastrointestinal: No reported hemesis, hematochezia, vomiting, or acute GI distress Musculoskeletal: Denies any acute onset joint swelling, redness, loss of ROM, or weakness Neurological: No reported episodes of acute onset apraxia, aphasia, dysarthria, agnosia, amnesia, paralysis, loss of coordination, or loss of consciousness  Medication Review  DULoxetine, HYDROcodone-acetaminophen, Melatonin, gabapentin, multivitamin, omeprazole, and valACYclovir  History Review  Allergy: Ms. Dondlinger has No Known Allergies. Drug: Ms. Delbridge  reports no history of drug use. Alcohol:  reports previous alcohol use. Tobacco:  reports that she quit smoking about 17 years ago. Her smoking use included cigarettes. She has never used smokeless tobacco. Social: Ms. Lacorte  reports that she quit smoking about 17 years ago. Her smoking use included cigarettes. She has never used smokeless tobacco. She reports previous alcohol use. She reports that she does not use drugs. Medical:  has a past medical history of Arthritis, Degenerative disorder of bone, Genuine stress incontinence, female (2012), GERD (gastroesophageal reflux disease), Migraines, and Obesity. Surgical: Ms. Naples  has a past surgical history that includes Laparoscopic hysterectomy (10/23/2010);  Midurethral sling (05/02/2011); and excision of vulvar lesion (05/02/2011). Family: family history includes Arthritis in her maternal grandmother and mother; Cancer in her father and paternal grandfather; Diabetes in her son; Heart attack in her maternal grandfather, mother, and paternal grandmother; Hypertension in her mother.  Laboratory Chemistry Profile   Renal Lab Results  Component Value Date   BUN 9 10/28/2020   CREATININE 0.79 10/28/2020   BCR 12 06/16/2018   GFR 83.15 10/28/2020    GFRAA 92 06/16/2018   GFRNONAA 80 06/16/2018     Hepatic Lab Results  Component Value Date   AST 17 10/28/2020   ALT 28 10/28/2020   ALBUMIN 4.0 10/28/2020   ALKPHOS 98 10/28/2020     Electrolytes Lab Results  Component Value Date   NA 140 10/28/2020   K 4.8 10/28/2020   CL 103 10/28/2020   CALCIUM 9.7 10/28/2020   MG 1.8 01/30/2019     Bone Lab Results  Component Value Date   VD25OH 41.01 10/28/2020     Inflammation (CRP: Acute Phase) (ESR: Chronic Phase) No results found for: CRP, ESRSEDRATE, LATICACIDVEN     Note: Above Lab results reviewed.  Physical Exam  General appearance: Well nourished, well developed, and well hydrated. In no apparent acute distress Mental status: Alert, oriented x 3 (person, place, & time)       Respiratory: No evidence of acute respiratory distress Eyes: PERLA Vitals: BP (!) 133/95   Pulse 80   Temp (!) 97 F (36.1 C) (Temporal)   Resp 15   Ht _0  (1.727 m)   Wt 215 lb (97.5 kg)   SpO2 100%   BMI 32.69 kg/m  BMI: Estimated body mass index is 32.69 kg/m as calculated from the following:   Height as of this encounter: _1  (1.727 m).   Weight as of this encounter: 215 lb (97.5 kg). Ideal: Ideal body weight: 63.9 kg (140 lb 14 oz) Adjusted ideal body weight: 77.3 kg (170 lb 8.4 oz)   Upper Extremity (UE) Exam    Side: Right upper extremity  Side: Left upper extremity  Skin & Extremity Inspection: Skin color, temperature, and hair growth are WNL. No peripheral edema or cyanosis. No masses, redness, swelling, asymmetry, or associated skin lesions. No contractures.  Skin & Extremity Inspection: Skin color, temperature, and hair growth are WNL. No peripheral edema or cyanosis. No masses, redness, swelling, asymmetry, or associated skin lesions. No contractures.  Functional ROM: Unrestricted range of motion  Functional ROM: Unrestricted ROM          Muscle Tone/Strength: Functionally intact. No obvious neuro-muscular  anomalies detected.  Muscle Tone/Strength: Functionally intact. No obvious neuro-muscular anomalies detected.  Sensory (Neurological): Arthropathic arthralgia, significantly improved          Sensory (Neurological): Unimpaired          Palpation: No palpable anomalies              Palpation: No palpable anomalies              Provocative Test(s):  Phalen's test: deferred Tinel's test: deferred Apley's scratch test (touch opposite shoulder):  Action 1 (Across chest): Decreased ROM, improved after treatment Action 2 (Overhead): Decreased ROM, improved after treatment Action 3 (LB reach): Decreased ROM, improved after treatment   Provocative Test(s):  Phalen's test: deferred Tinel's test: deferred Apley's scratch test (touch opposite shoulder):  Action 1 (Across chest): deferred Action 2 (Overhead): deferred Action 3 (LB reach): deferred     Lumbar  Spine Area Exam  Skin & Axial Inspection:No masses, redness, or swelling Alignment:Symmetrical Functional WGY:KZLD restricted ROMright greater than left  Stability:No instability detected Muscle Tone/Strength:Functionally intact. No obvious neuro-muscular anomalies detected. Sensory (Neurological):Dermatomal pain patternon the right Palpation:No palpable anomalies Provocative Tests: Hyperextension/rotation test:(+)bilaterally for facet joint pain.,  Improved after treatment Lumbar quadrant test (Kemp's test):(+)on the right for foraminal stenosis  *(Flexion, ABduction and External Rotation) Gait & Posture Assessment  Ambulation:Unassisted Gait:Relatively normal for age and body habitus Posture:WNL Lower Extremity Exam    Side:Right lower extremity  Side:Left lower extremity  Stability:No instability observed  Stability:No instability observed  Skin & Extremity Inspection:Skin color, temperature, and hair growth are WNL. No peripheral edema or cyanosis. No masses, redness,  swelling, asymmetry, or associated skin lesions. No contractures.  Skin & Extremity Inspection:Skin color, temperature, and hair growth are WNL. No peripheral edema or cyanosis. No masses, redness, swelling, asymmetry, or associated skin lesions. No contractures.  Functional JTT:SVXB restricted ROMfor hip and knee joints Limited SLR (straight leg raise)  Functional LTJ:QZES restricted ROMfor hip and knee joints   Muscle Tone/Strength:Functionally intact. No obvious neuro-muscular anomalies detected.  Muscle Tone/Strength:Functionally intact. No obvious neuro-muscular anomalies detected.  Sensory (Neurological):Unimpaired  Sensory (Neurological):Unimpaired  DTR: Patellar:deferred today Achilles:deferred today Plantar:deferred today  DTR: Patellar:deferred today Achilles:deferred today Plantar:deferred today  Palpation:No palpable anomalies  Palpation:No palpable anomalies      Assessment   Status Diagnosis  Controlled Controlled Controlled 1. Lumbar spondylosis   2. Lumbar facet arthropathy   3. Lumbar facetogenic pain   4. Right rotator cuff tear arthropathy   5. Bilateral primary osteoarthritis of knee   6. Chronic pain syndrome       Plan of Care  [name] has a current medication list which includes the following long-term medication(s): duloxetine, gabapentin, and omeprazole.  1. Lumbar spondylosis -Status post bilateral L3, L4, L5 lumbar facet medial branch nerve block 10/19/2020 which provided pain relief.  Can repeat in the future and consider lumbar radiofrequency ablation - LUMBAR FACET(MEDIAL BRANCH NERVE BLOCK) MBNB; Standing  2. Right rotator cuff tear arthropathy - HYDROcodone-acetaminophen (NORCO/VICODIN) 5-325 MG tablet; Take 1 tablet by mouth every 6 (six) hours as needed for moderate pain.  Dispense: 120 tablet; Refill: 0 - SHOULDER INJECTION; Standing  3. Bilateral primary osteoarthritis of knee -  KNEE INJECTION; Future  4. Chronic pain syndrome - HYDROcodone-acetaminophen (NORCO/VICODIN) 5-325 MG tablet; Take 1 tablet by mouth every 6 (six) hours as needed for moderate pain.  Dispense: 120 tablet; Refill: 0 - LUMBAR FACET(MEDIAL BRANCH NERVE BLOCK) MBNB; Standing   Pharmacotherapy (Medications Ordered): Meds ordered this encounter  Medications  . HYDROcodone-acetaminophen (NORCO/VICODIN) 5-325 MG tablet    Sig: Take 1 tablet by mouth every 6 (six) hours as needed for moderate pain.    Dispense:  120 tablet    Refill:  0   Orders:  Orders Placed This Encounter  Procedures  . KNEE INJECTION    Local Anesthetic & Steroid injection.    Standing Status:   Future    Standing Expiration Date:   02/19/2021    Scheduling Instructions:     Side: Bilateral     Sedation: None     Timeframe: As soon as schedule allows    Order Specific Question:   Where will this procedure be performed?    Answer:   [facility]  . SHOULDER INJECTION    For shoulder pain.    Standing Status:   Standing  Number of Occurrences:   1    Standing Expiration Date:   11/21/2021    Scheduling Instructions:     Side: RIGHT     Sedation: Patient's choice.     TIMEFRAME: PRN procedure. (Ms. Messerschmidt will call when needed.)    Order Specific Question:   Where will this procedure be performed?    Answer:   [facility]    Comments:   Castin Donaghue  . LUMBAR FACET(MEDIAL BRANCH NERVE BLOCK) MBNB    Scheduling timeframe: (PRN procedure) Ms. Mette will call when needed. Clinical indication: Axial low back pain. Lumbosacral Spondylosis (M47.897).  Sedation: Usually done with sedation. (May be done without sedation if so desired by patient.) Requirements: NPO x 8 hrs.; Driver; Stop blood thinners. Interval: No sooner than two weeks for diagnostic or therapeutic. No sooner than every other month for palliative.    Standing Status:   Standing    Number of Occurrences:   5    Standing Expiration Date:    11/21/2021    Scheduling Instructions:     Procedure: Lumbar facet block (AKA.: Lumbosacral medial branch nerve block)     Level: L3-4, L4-5,  Facets ( L3, L4, L5,  Medial Branch Nerves)     Laterality: Bilateral    Order Specific Question:   Where will this procedure be performed?    Answer:   [facility]   Follow-up plan:   Return in about 2 weeks (around 12/05/2020) for Bilateral knee steroid.     s/p IA hyalgan #1 on 08/19/2019, #2 09/23/2019 not very effective unfortunately.  Lumbar MRI shows facet disease at L3, L4, L5 bilaterally.  Bilateral L3, L4, L5 medial branch nerve block #1; right glenohumeral shoulder joint injection (anterior approach) 10/19/2020 helped significantly, repeat as needed          Recent Visits Date Type Provider Dept  10/19/20 Procedure visit Gillis Santa, MD [facility]  09/27/20 Office Visit Gillis Santa, MD [facility]  08/24/20 Procedure visit Gillis Santa, MD [facility]  Showing recent visits within past 90 days and meeting all other requirements Today's Visits Date Type Provider Dept  11/21/20 Office Visit Gillis Santa, MD [facility]  Showing today's visits and meeting all other requirements Future Appointments Date Type Provider Dept  01/19/21 Appointment Gillis Santa, MD [facility]  Showing future appointments within next 90 days and meeting all other requirements  I discussed the assessment and treatment plan with the patient. The patient was provided an opportunity to ask questions and all were answered. The patient agreed with the plan and demonstrated an understanding of the instructions.  Patient advised to call back or seek an in-person evaluation if the symptoms or condition worsens.  Duration of encounter:28mnutes.  Note by: BGillis Santa MD Date: 11/21/2020; Time: 3:40 PM

## 2020-11-21 NOTE — Progress Notes (Signed)
Nursing Pain Medication Assessment:  Safety precautions to be maintained throughout the outpatient stay will include: orient to surroundings, keep bed in low position, maintain call bell within reach at all times, provide assistance with transfer out of bed and ambulation.  Medication Inspection Compliance: [name] did not comply with our request to bring her pills to be counted. She was reminded that bringing the medication bottles, even when empty, is a requirement.  Medication: None brought in. Pill/Patch Count: None available to be counted. Bottle Appearance: No container available. Did not bring bottle(s) to appointment. Filled Date: N/A Last Medication intake:  Today   Pt instructed to bring Hydrocodone container to all appts.

## 2020-11-22 ENCOUNTER — Telehealth: Payer: Self-pay

## 2020-11-22 DIAGNOSIS — G894 Chronic pain syndrome: Secondary | ICD-10-CM

## 2020-11-22 NOTE — Telephone Encounter (Signed)
The patient called and said she was asked yesterday what meds. She needed. She got home and noticed she didn't have refills on gabapentin. Wants refill.

## 2020-11-28 MED ORDER — GABAPENTIN 300 MG PO CAPS: 300.0000 mg | ORAL_CAPSULE | Freq: Two times a day (BID) | ORAL | 3 refills | Status: DC

## 2020-11-28 NOTE — Telephone Encounter (Signed)
Patient notified

## 2020-12-07 ENCOUNTER — Other Ambulatory Visit: Payer: Self-pay

## 2020-12-07 ENCOUNTER — Ambulatory Visit
Payer: 59 | Attending: Student in an Organized Health Care Education/Training Program | Admitting: Student in an Organized Health Care Education/Training Program

## 2020-12-07 ENCOUNTER — Encounter: Payer: Self-pay | Admitting: Student in an Organized Health Care Education/Training Program

## 2020-12-07 VITALS — BP 164/106 | HR 104 | Temp 96.4°F | Resp 16 | Ht 68.0 in | Wt 220.0 lb

## 2020-12-07 DIAGNOSIS — M170 Bilateral primary osteoarthritis of knee: Secondary | ICD-10-CM

## 2020-12-07 DIAGNOSIS — G894 Chronic pain syndrome: Secondary | ICD-10-CM | POA: Diagnosis present

## 2020-12-07 MED ORDER — LIDOCAINE HCL 2 % IJ SOLN
INTRAMUSCULAR | Status: AC
  Filled 2020-12-07: qty 20

## 2020-12-07 MED ORDER — METHYLPREDNISOLONE ACETATE 40 MG/ML IJ SUSP
INTRAMUSCULAR | Status: AC
  Filled 2020-12-07: qty 2

## 2020-12-07 MED ORDER — METHYLPREDNISOLONE ACETATE 40 MG/ML IJ SUSP
40.0000 mg | Freq: Once | INTRAMUSCULAR | Status: AC
  Administered 2020-12-07: 40 mg

## 2020-12-07 MED ORDER — METHYLPREDNISOLONE ACETATE 40 MG/ML IJ SUSP
40.0000 mg | Freq: Once | INTRAMUSCULAR | Status: AC
  Administered 2020-12-07: 40 mg via INTRA_ARTICULAR

## 2020-12-07 MED ORDER — LIDOCAINE HCL 2 % IJ SOLN
20.0000 mL | Freq: Once | INTRAMUSCULAR | Status: AC
  Administered 2020-12-07: 400 mg

## 2020-12-07 MED ORDER — ROPIVACAINE HCL 2 MG/ML IJ SOLN
4.0000 mL | Freq: Once | INTRAMUSCULAR | Status: AC
  Administered 2020-12-07: 4 mL via INTRA_ARTICULAR
  Filled 2020-12-07: qty 10

## 2020-12-07 NOTE — Patient Instructions (Signed)

## 2020-12-07 NOTE — Progress Notes (Signed)
PROVIDER NOTE: Information contained herein reflects review and annotations entered in association with encounter. Interpretation of such information and data should be left to medically-trained personnel. Information provided to patient can be located elsewhere in the medical record under "Patient Instructions". Document created using STT-dictation technology, any transcriptional errors that may result from process are unintentional.    Patient: [name]  Service Category: Procedure  Provider: Gillis Santa, MD  DOB: Sep 20, 1961  DOS: 12/07/2020  Location: [facility]  MRN: [id number]  Setting: Ambulatory - outpatient  Referring Provider: Gillis Santa, MD  Type: Established Patient  Specialty: Interventional Pain Management  PCP: Marval Regal, NP   Primary Reason for Visit: Interventional Pain Management Treatment. CC: Knee Pain (Bilateral knees)  Procedure:          Anesthesia, Analgesia, Anxiolysis:  Type: Diagnostic Intra-Articular Local anesthetic and steroid Knee Injection #1  Region: Medial infrapatellar Knee Region Level: Knee Joint Laterality: Bilateral  Type: Local Anesthesia Indication(s): Analgesia         Local Anesthetic: Lidocaine 1-2% Route: Infiltration ([place]/IM) IV Access: Declined Sedation: Declined   Position: Sitting   Indications: 1. Chronic pain syndrome   2. Bilateral primary osteoarthritis of knee    Pain Score: Pre-procedure: 7 /10 Post-procedure: 2/10   Pre-op H&P Assessment:  Ms. Fan is a 59 y.o. (year old), female patient, seen today for interventional treatment. She  has a past surgical history that includes Laparoscopic hysterectomy (10/23/2010); Midurethral sling (05/02/2011); and excision of vulvar lesion (05/02/2011). Ms. Shaker has a current medication list which includes the following prescription(s): clindamycin, gabapentin, hydrocodone-acetaminophen, ibu, melatonin, multivitamin, omeprazole, valacyclovir, and  duloxetine. Her primarily concern today is the Knee Pain (Bilateral knees)  Initial Vital Signs:  Pulse/HCG Rate: (!) 104  Temp: (!) 96.4 F (35.8 C) Resp: 16 BP: (!) 164/106 SpO2: 99 %  BMI: Estimated body mass index is 33.45 kg/m as calculated from the following:   Height as of this encounter: 5\' 8"  (1.727 m).   Weight as of this encounter: 220 lb (99.8 kg).  Risk Assessment: Allergies: Reviewed. She has No Known Allergies.  Allergy Precautions: None required Coagulopathies: Reviewed. None identified.  Blood-thinner therapy: None at this time Active Infection(s): Reviewed. None identified. Ms. Trumbull is afebrile  Site Confirmation: Ms. Revak was asked to confirm the procedure and laterality before marking the site Procedure checklist: Completed Consent: Before the procedure and under the influence of no sedative(s), amnesic(s), or anxiolytics, the patient was informed of the treatment options, risks and possible complications. To fulfill our ethical and legal obligations, as recommended by the American Medical Association's Code of Ethics, I have informed the patient of my clinical impression; the nature and purpose of the treatment or procedure; the risks, benefits, and possible complications of the intervention; the alternatives, including doing nothing; the risk(s) and benefit(s) of the alternative treatment(s) or procedure(s); and the risk(s) and benefit(s) of doing nothing. The patient was provided information about the general risks and possible complications associated with the procedure. These may include, but are not limited to: failure to achieve desired goals, infection, bleeding, organ or nerve damage, allergic reactions, paralysis, and death. In addition, the patient was informed of those risks and complications associated to the procedure, such as failure to decrease pain; infection; bleeding; organ or nerve damage with subsequent damage to sensory, motor, and/or autonomic  systems, resulting in permanent pain, numbness, and/or weakness of one or several areas of the body; allergic reactions; (i.e.: anaphylactic reaction); and/or  death. Furthermore, the patient was informed of those risks and complications associated with the medications. These include, but are not limited to: allergic reactions (i.e.: anaphylactic or anaphylactoid reaction(s)); adrenal axis suppression; blood sugar elevation that in diabetics may result in ketoacidosis or comma; water retention that in patients with history of congestive heart failure may result in shortness of breath, pulmonary edema, and decompensation with resultant heart failure; weight gain; swelling or edema; medication-induced neural toxicity; particulate matter embolism and blood vessel occlusion with resultant organ, and/or nervous system infarction; and/or aseptic necrosis of one or more joints. Finally, the patient was informed that Medicine is not an exact science; therefore, there is also the possibility of unforeseen or unpredictable risks and/or possible complications that may result in a catastrophic outcome. The patient indicated having understood very clearly. We have given the patient no guarantees and we have made no promises. Enough time was given to the patient to ask questions, all of which were answered to the patient's satisfaction. Ms. Nordine has indicated that she wanted to continue with the procedure. Attestation: I, the ordering provider, attest that I have discussed with the patient the benefits, risks, side-effects, alternatives, likelihood of achieving goals, and potential problems during recovery for the procedure that I have provided informed consent. Date  Time: 12/07/2020  9:35 AM  Pre-Procedure Preparation:  Monitoring: As per clinic protocol. Respiration, ETCO2, SpO2, BP, heart rate and rhythm monitor placed and checked for adequate function Safety Precautions: Patient was assessed for positional comfort  and pressure points before starting the procedure. Time-out: I initiated and conducted the "Time-out" before starting the procedure, as per protocol. The patient was asked to participate by confirming the accuracy of the "Time Out" information. Verification of the correct person, site, and procedure were performed and confirmed by me, the nursing staff, and the patient. "Time-out" conducted as per Joint Commission's Universal Protocol (UP.01.01.01). Time: 0957  Description of Procedure:          Target Area: Knee Joint Approach: Just above the Medial tibial plateau, lateral to the infrapatellar tendon. Area Prepped: Entire knee area, from the mid-thigh to the mid-shin. DuraPrep (Iodine Povacrylex [0.7% available iodine] and Isopropyl Alcohol, 74% w/w) Safety Precautions: Aspiration looking for blood return was conducted prior to all injections. At no point did we inject any substances, as a needle was being advanced. No attempts were made at seeking any paresthesias. Safe injection practices and needle disposal techniques used. Medications properly checked for expiration dates. SDV (single dose vial) medications used. Description of the Procedure: Protocol guidelines were followed. The patient was placed in position over the fluoroscopy table. The target area was identified and the area prepped in the usual manner. Skin & deeper tissues infiltrated with local anesthetic. Appropriate amount of time allowed to pass for local anesthetics to take effect. The procedure needles were then advanced to the target area. Proper needle placement secured. Negative aspiration confirmed. Solution injected in intermittent fashion, asking for systemic symptoms every 0.5cc of injectate. The needles were then removed and the area cleansed, making sure to leave some of the prepping solution back to take advantage of its long term bactericidal properties. Vitals:   12/07/20 0940  BP: (!) 164/106  Pulse: (!) 104  Resp: 16   Temp: (!) 96.4 F (35.8 C)  TempSrc: Temporal  SpO2: 99%  Weight: 220 lb (99.8 kg)  Height: 5\' 8"  (1.727 m)    Start Time: 0957 hrs. End Time: 0959 hrs. Materials:  Needle(s) Type: Regular  needle Gauge: 25G Length: 1.5-in Medication(s): Please see orders for medications and dosing details. 5 cc solution made of 4 cc of 0.2% ropivacaine, 1 cc of methylprednisolone, 40 mg/cc. Injected into the left knee 5 cc solution made of 4 cc of 0.2% ropivacaine, 1 cc of methylprednisolone, 40 mg/cc. Injected into the right knee  Imaging Guidance:          Type of Imaging Technique: None used Indication(s): N/A Exposure Time: No patient exposure Contrast: None used. Fluoroscopic Guidance: N/A Ultrasound Guidance: N/A Interpretation: N/A  Antibiotic Prophylaxis:   Anti-infectives (From admission, onward)   None     Indication(s): None identified  Post-operative Assessment:  Post-procedure Vital Signs:  Pulse/HCG Rate: (!) 104  Temp: (!) 96.4 F (35.8 C) Resp: 16 BP: (!) 164/106 SpO2: 99 %  EBL: None  Complications: No immediate post-treatment complications observed by team, or reported by patient.  Note: The patient tolerated the entire procedure well. A repeat set of vitals were taken after the procedure and the patient was kept under observation following institutional policy, for this type of procedure. Post-procedural neurological assessment was performed, showing return to baseline, prior to discharge. The patient was provided with post-procedure discharge instructions, including a section on how to identify potential problems. Should any problems arise concerning this procedure, the patient was given instructions to immediately contact us, at any time, without hesitation. In any case, we plan to contact the patient by telephone for a follow-up status report regarding this interventional procedure.  Comments:  No additional relevant information.  Plan of Care  Orders:  No  orders of the defined types were placed in this encounter.  Chronic Opioid Analgesic:  hydrocodone 5 mg twice daily as needed, quantity 60/month    Medications ordered for procedure: Meds ordered this encounter  Medications  . ropivacaine (PF) 2 mg/mL (0.2%) (NAROPIN) injection 4 mL  . methylPREDNISolone acetate (DEPO-MEDROL) injection 40 mg  . methylPREDNISolone acetate (DEPO-MEDROL) injection 40 mg  . lidocaine (XYLOCAINE) 2 % (with pres) injection 400 mg   Medications administered: We administered ropivacaine (PF) 2 mg/mL (0.2%), methylPREDNISolone acetate, methylPREDNISolone acetate, and lidocaine.  See the medical record for exact dosing, route, and time of administration.  Follow-up plan:   Return for [facility] appt.      s/p IA hyalgan #1 on 08/19/2019, #2 09/23/2019 not very effective unfortunately.  Lumbar MRI shows facet disease at L3, L4, L5 bilaterally.  Bilateral L3, L4, L5 medial branch nerve block #1; right glenohumeral shoulder joint injection (anterior approach) 10/19/2020 helped significantly, repeat as needed, bilateral knee steroid injection           Recent Visits Date Type Provider Dept  11/21/20 Office Visit Gillis Santa, MD [facility]  10/19/20 Procedure visit Gillis Santa, MD [facility]  09/27/20 Office Visit Gillis Santa, MD [facility]  Showing recent visits within past 90 days and meeting all other requirements Today's Visits Date Type Provider Dept  12/07/20 Procedure visit Gillis Santa, MD [facility]  Showing today's visits and meeting all other requirements Future Appointments Date Type Provider Dept  01/19/21 Appointment Gillis Santa, MD [facility]  Showing future appointments within next 90 days and meeting all other requirements  Disposition: Discharge home  Discharge (Date  Time): 12/07/2020; 1006 hrs.   Primary Care Physician: Marval Regal, NP Location: [facility] Note by: Gillis Santa, MD Date: 12/07/2020; Time: 1:11 PM  Disclaimer:  Medicine is not  an Chief Strategy Officer. The only guarantee in medicine is that nothing is guaranteed. It is important to note that the decision to proceed with this intervention was based on the information collected from the patient. The Data and conclusions were drawn from the patient's questionnaire, the interview, and the physical examination. Because the information was provided in large part by the patient, it cannot be guaranteed that it has not been purposely or unconsciously manipulated. Every effort has been made to obtain as much relevant data as possible for this evaluation. It is important to note that the conclusions that lead to this procedure are derived in large part from the available data. Always take into account that the treatment will also be dependent on availability of resources and existing treatment guidelines, considered by other Pain Management Practitioners as being common knowledge and practice, at the time of the intervention. For Medico-Legal purposes, it is also important to point out that variation in procedural techniques and pharmacological choices are the acceptable norm. The indications, contraindications, technique, and results of the above procedure should only be interpreted and judged by a Board-Certified Interventional Pain Specialist with extensive familiarity and expertise in the same exact procedure and technique.

## 2020-12-07 NOTE — Progress Notes (Signed)
Safety precautions to be maintained throughout the outpatient stay will include: orient to surroundings, keep bed in low position, maintain call bell within reach at all times, provide assistance with transfer out of bed and ambulation.  

## 2020-12-08 ENCOUNTER — Telehealth: Payer: Self-pay | Admitting: *Deleted

## 2020-12-08 NOTE — Telephone Encounter (Signed)
Attempted to call for post procedure follow-up. Message left. 

## 2020-12-27 ENCOUNTER — Telehealth: Payer: Self-pay | Admitting: Student in an Organized Health Care Education/Training Program

## 2020-12-27 NOTE — Telephone Encounter (Signed)
Change pharmacy to  [facility]  She also needs refill on Cymbalta and gabapentin, her appt for med mgmt is 01-19-21. Please let patient know if this can be sent in.

## 2020-12-28 ENCOUNTER — Other Ambulatory Visit: Payer: Self-pay | Admitting: Student in an Organized Health Care Education/Training Program

## 2020-12-28 DIAGNOSIS — G894 Chronic pain syndrome: Secondary | ICD-10-CM

## 2020-12-28 MED ORDER — DULOXETINE HCL 20 MG PO CPEP: 40.0000 mg | ORAL_CAPSULE | Freq: Every day | ORAL | 3 refills | Status: DC

## 2020-12-28 NOTE — Telephone Encounter (Signed)
Primary pharmacy changed to [place]. Patient notified that last Gabapentin prescribed 11-28-20, with 2 RF. I will ask Dr. Holley Raring to prescribe Cymbalta. Attempted to call patient, message left.

## 2020-12-28 NOTE — Telephone Encounter (Signed)
Patient returned call and left work number you can call please [phone]

## 2020-12-28 NOTE — Telephone Encounter (Signed)
Called this work number given. It was to a voicemail at a physician's office. I did not leave a message.

## 2020-12-29 ENCOUNTER — Telehealth: Payer: Self-pay | Admitting: Student in an Organized Health Care Education/Training Program

## 2020-12-29 DIAGNOSIS — G894 Chronic pain syndrome: Secondary | ICD-10-CM

## 2020-12-29 MED ORDER — DULOXETINE HCL 20 MG PO CPEP: 40.0000 mg | ORAL_CAPSULE | Freq: Every day | ORAL | 3 refills | Status: DC

## 2020-12-29 NOTE — Telephone Encounter (Signed)
Patient notified that Cymbalta has been sent to [facility].

## 2020-12-29 NOTE — Telephone Encounter (Signed)
Patient needs pharmacy changed to [facility] and Cymbalta re sent to [facility] please Can call work number [phone]

## 2020-12-29 NOTE — Telephone Encounter (Signed)
The script for Cymbalta that you sent yesterday still went to the incorrect pharmacy, even though I did change her primary pharmacy to the correct one, [facility]. Will you please resend to [facility] on [address]

## 2021-01-19 ENCOUNTER — Other Ambulatory Visit: Payer: Self-pay

## 2021-01-19 ENCOUNTER — Encounter: Payer: Self-pay | Admitting: Student in an Organized Health Care Education/Training Program

## 2021-01-19 ENCOUNTER — Ambulatory Visit
Payer: 59 | Attending: Student in an Organized Health Care Education/Training Program | Admitting: Student in an Organized Health Care Education/Training Program

## 2021-01-19 VITALS — BP 149/99 | HR 96 | Temp 96.4°F | Resp 16 | Ht 68.0 in | Wt 210.0 lb

## 2021-01-19 DIAGNOSIS — M25511 Pain in right shoulder: Secondary | ICD-10-CM | POA: Insufficient documentation

## 2021-01-19 DIAGNOSIS — M75101 Unspecified rotator cuff tear or rupture of right shoulder, not specified as traumatic: Secondary | ICD-10-CM | POA: Diagnosis present

## 2021-01-19 DIAGNOSIS — M19011 Primary osteoarthritis, right shoulder: Secondary | ICD-10-CM | POA: Diagnosis present

## 2021-01-19 DIAGNOSIS — M12811 Other specific arthropathies, not elsewhere classified, right shoulder: Secondary | ICD-10-CM | POA: Insufficient documentation

## 2021-01-19 DIAGNOSIS — M47816 Spondylosis without myelopathy or radiculopathy, lumbar region: Secondary | ICD-10-CM | POA: Insufficient documentation

## 2021-01-19 DIAGNOSIS — G8929 Other chronic pain: Secondary | ICD-10-CM | POA: Diagnosis present

## 2021-01-19 DIAGNOSIS — G894 Chronic pain syndrome: Secondary | ICD-10-CM | POA: Diagnosis present

## 2021-01-19 DIAGNOSIS — M170 Bilateral primary osteoarthritis of knee: Secondary | ICD-10-CM | POA: Insufficient documentation

## 2021-01-19 MED ORDER — GABAPENTIN 300 MG PO CAPS: 300.0000 mg | ORAL_CAPSULE | Freq: Two times a day (BID) | ORAL | 2 refills | Status: DC

## 2021-01-19 MED ORDER — HYDROCODONE-ACETAMINOPHEN 5-325 MG PO TABS: 1.0000 | ORAL_TABLET | Freq: Four times a day (QID) | ORAL | 0 refills | Status: AC | PRN

## 2021-01-19 MED ORDER — DULOXETINE HCL 60 MG PO CPEP: 60.0000 mg | ORAL_CAPSULE | Freq: Every day | ORAL | 2 refills | Status: DC

## 2021-01-19 NOTE — Progress Notes (Signed)
Nursing Pain Medication Assessment:  Safety precautions to be maintained throughout the outpatient stay will include: orient to surroundings, keep bed in low position, maintain call bell within reach at all times, provide assistance with transfer out of bed and ambulation.  Medication Inspection Compliance: Pill count conducted under aseptic conditions, in front of the patient. Neither the pills nor the bottle was removed from the patient's sight at any time. Once count was completed pills were immediately returned to the patient in their original bottle.  Medication: Hydrocodone/APAP Pill/Patch Count: 20 of 120 pills remain Pill/Patch Appearance: Markings consistent with prescribed medication Bottle Appearance: Standard pharmacy container. Clearly labeled. Filled Date: 68 / 31 / 21 Last Medication intake:  Yesterday

## 2021-01-19 NOTE — Patient Instructions (Addendum)
Hydrocodone/APAP to last until 02/28/21, cymbalta and gabapentin have been escribed to your pharmacy.

## 2021-01-19 NOTE — Progress Notes (Signed)
PROVIDER NOTE: Information contained herein reflects review and annotations entered in association with encounter. Interpretation of such information and data should be left to medically-trained personnel. Information provided to patient can be located elsewhere in the medical record under "Patient Instructions". Document created using STT-dictation technology, any transcriptional errors that may result from process are unintentional.    Patient: [name]  Service Category: E/M  Provider: Gillis Santa, MD  DOB: [birth year]  DOS: 01/19/2021  Specialty: Interventional Pain Management  MRN: [id number]  Setting: Ambulatory outpatient  PCP: Marval Regal, NP  Type: Established Patient    Referring Provider: Marval Regal, NP  Location: Office  Delivery: Face-to-face     HPI  [name], a 60 y.o. year old female, is here today because of her Bilateral primary osteoarthritis of knee [M17.0]. Ms. Babich primary complain today is Knee Pain (bilat) and Shoulder Pain (right) Last encounter: My last encounter with her was on 12/29/2020. Pertinent problems: Ms. Quant has Obesity; Arthritis; Chronic pain syndrome; Lumbar spondylosis; Bilateral primary osteoarthritis of knee; Bilateral hip pain; Chronic SI joint pain; Lumbar radiculopathy; and Long-term current use of opiate analgesic on their pertinent problem list. Pain Assessment: Severity of Chronic pain is reported as a 8 /10. Location: Knee Right,Left/denies. Onset: More than a month ago. Quality: Aching. Timing: Constant. Modifying factor(s): meds. Vitals:  height is _0  (1.727 m) and weight is 210 lb (95.3 kg). Her temporal temperature is 96.4 F (35.8 C) (abnormal). Her blood pressure is 149/99 (abnormal) and her pulse is 96. Her respiration is 16 and oxygen saturation is 98%.   Reason for encounter: both, medication management and post-procedure assessment.   Complaining of bilateral knee pain, s/p b/l knee steroid injection on  12/22 which was not beneficial long term, pain at baseline now  Having return of right shoulder pain, related to shoulder OA. Previous right anterior glenohumeral injection end of November.  This provided her with approximately 70 to 75% pain relief for over 2 months. 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 Continues gabapentin and hydrocodone as prescribed.  Post-Procedure Evaluation  Procedure (12/07/2020): Bilateral knee steroid injection Sedation: Please see nurses note.  Effectiveness during initial hour after procedure(Ultra-Short Term Relief): 100 %   Local anesthetic used: Long-acting (4-6 hours) Effectiveness: Defined as any analgesic benefit obtained secondary to the administration of local anesthetics. This carries significant diagnostic value as to the etiological location, or anatomical origin, of the pain. Duration of benefit is expected to coincide with the duration of the local anesthetic used.  Effectiveness during initial 4-6 hours after procedure(Short-Term Relief): 100 %   Long-term benefit: Defined as any relief past the pharmacologic duration of the local anesthetics.  Effectiveness past the initial 6 hours after procedure(Long-Term Relief): 100 % (X 1 week, now back to pre-procedure pain)   Current benefits: Defined as benefit that persist at this time.   Analgesia:  Back to baseline   Pharmacotherapy Assessment   11/28/2020  11/21/2020   2  Hydrocodone-Acetamin 5-325 Mg  120.00  30  Bi Lat  [id number]  Thr (4878)  0/0  20.00 MME  Private Pay  [employer]      Analgesic: hydrocodone 5 mg twice daily as needed, quantity 60/month    Monitoring: [place] PMP: PDMP reviewed during this encounter.       Pharmacotherapy: No side-effects or adverse reactions reported. Compliance: No problems identified. Effectiveness: Clinically acceptable.  [name], RN  01/19/2021  8:48 AM  Sign when Signing Visit Nursing Pain Medication  Assessment:  Safety precautions to be maintained throughout the outpatient stay will include: orient to surroundings, keep bed in low position, maintain call bell within reach at all times, provide assistance with transfer out of bed and ambulation.  Medication Inspection Compliance: Pill count conducted under aseptic conditions, in front of the patient. Neither the pills nor the bottle was removed from the patient's sight at any time. Once count was completed pills were immediately returned to the patient in their original bottle.  Medication: Hydrocodone/APAP Pill/Patch Count: 20 of 120 pills remain Pill/Patch Appearance: Markings consistent with prescribed medication Bottle Appearance: Standard pharmacy container. Clearly labeled. Filled Date: 61 / 24 / 21 Last Medication intake:  Yesterday    UDS:  Summary  Date Value Ref Range Status  05/18/2020 Note  Final    Comment:    ==================================================================== [facility] 13 (MW) ==================================================================== Test                             Result       Flag       Units Drug Present and Declared for Prescription Verification   Hydrocodone                    223          EXPECTED   ng/mg creat   Norhydrocodone                 487          EXPECTED   ng/mg creat    Sources of hydrocodone include scheduled prescription medications.    Norhydrocodone is an expected metabolite of hydrocodone. ==================================================================== Test                      Result    Flag   Units      Ref Range   Creatinine              31               mg/dL      >=20 ==================================================================== Declared Medications:  The flagging and interpretation on this report are based on the  following declared medications.  Unexpected results may arise from  inaccuracies in the declared medications.  **Note: The  testing scope of this panel includes these medications:  Hydrocodone (Norco)  **Note: The testing scope of this panel does not include the  following reported medications:  Acetaminophen (Norco)  Azithromycin (Zithromax)  Duloxetine (Cymbalta)  Gabapentin (Neurontin)  Melatonin  Naproxen (Aleve)  Omeprazole (Prilosec)  Ondansetron (Zofran)  Topical  Turmeric  Valacyclovir (Valtrex) ==================================================================== For clinical consultation, please call [phone]. ====================================================================      ROS  Constitutional: Denies any fever or chills Gastrointestinal: No reported hemesis, hematochezia, vomiting, or acute GI distress Musculoskeletal: Bilateral knee, right shoulder pain Neurological: No reported episodes of acute onset apraxia, aphasia, dysarthria, agnosia, amnesia, paralysis, loss of coordination, or loss of consciousness  Medication Review  DULoxetine, HYDROcodone-acetaminophen, Melatonin, gabapentin, multivitamin, omeprazole, and valACYclovir  History Review  Allergy: Ms. Venier has No Known Allergies. Drug: Ms. Daubert  reports no history of drug use. Alcohol:  reports previous alcohol use. Tobacco:  reports that she quit smoking about 17 years ago. Her smoking use included cigarettes. She has never used smokeless tobacco. Social: Ms. Pinkus  reports that she quit smoking  about 17 years ago. Her smoking use included cigarettes. She has never used smokeless tobacco. She reports previous alcohol use. She reports that she does not use drugs. Medical:  has a past medical history of Arthritis, Degenerative disorder of bone, Genuine stress incontinence, female (2012), GERD (gastroesophageal reflux disease), Migraines, and Obesity. Surgical: Ms. Yano  has a past surgical history that includes Laparoscopic hysterectomy (10/23/2010); Midurethral sling (05/02/2011); and excision of vulvar lesion  (05/02/2011). Family: family history includes Arthritis in her maternal grandmother and mother; Cancer in her father and paternal grandfather; Diabetes in her son; Heart attack in her maternal grandfather, mother, and paternal grandmother; Hypertension in her mother.  Laboratory Chemistry Profile   Renal Lab Results  Component Value Date   BUN 9 10/28/2020   CREATININE 0.79 10/28/2020   BCR 12 06/16/2018   GFR 83.15 10/28/2020   GFRAA 92 06/16/2018   GFRNONAA 80 06/16/2018     Hepatic Lab Results  Component Value Date   AST 17 10/28/2020   ALT 28 10/28/2020   ALBUMIN 4.0 10/28/2020   ALKPHOS 98 10/28/2020     Electrolytes Lab Results  Component Value Date   NA 140 10/28/2020   K 4.8 10/28/2020   CL 103 10/28/2020   CALCIUM 9.7 10/28/2020   MG 1.8 01/30/2019     Bone Lab Results  Component Value Date   VD25OH 41.01 10/28/2020     Inflammation (CRP: Acute Phase) (ESR: Chronic Phase) No results found for: CRP, ESRSEDRATE, LATICACIDVEN     Note: Above Lab results reviewed.  Physical Exam  General appearance: Well nourished, well developed, and well hydrated. In no apparent acute distress Mental status: Alert, oriented x 3 (person, place, & time)       Respiratory: No evidence of acute respiratory distress Eyes: PERLA Vitals: BP (!) 149/99 (BP Location: Left Arm, Patient Position: Sitting, Cuff Size: Large)   Pulse 96   Temp (!) 96.4 F (35.8 C) (Temporal)   Resp 16   Ht _0  (1.727 m)   Wt 210 lb (95.3 kg)   SpO2 98%   BMI 31.93 kg/m  BMI: Estimated body mass index is 31.93 kg/m as calculated from the following:   Height as of this encounter: _1  (1.727 m).   Weight as of this encounter: 210 lb (95.3 kg). Ideal: Ideal body weight: 63.9 kg (140 lb 14 oz) Adjusted ideal body weight: 76.4 kg (168 lb 8.4 oz)  Upper Extremity (UE) Exam    Side:Right upper extremity  Side:Left upper extremity  Skin & Extremity Inspection:Skin color, temperature, and  hair growth are WNL. No peripheral edema or cyanosis. No masses, redness, swelling, asymmetry, or associated skin lesions. No contractures.  Skin & Extremity Inspection:Skin color, temperature, and hair growth are WNL. No peripheral edema or cyanosis. No masses, redness, swelling, asymmetry, or associated skin lesions. No contractures.  Functional ROM: Unrestricted range of motion  Functional SWN:[id number] ROM  Muscle Tone/Strength:Functionally intact. No obvious neuro-muscular anomalies detected.  Muscle Tone/Strength:Functionally intact. No obvious neuro-muscular anomalies detected.  Sensory (Neurological):Arthropathic arthralgia, significantly improved  Sensory (Neurological):Unimpaired  Palpation:No palpable anomalies  Palpation:No palpable anomalies  Provocative Test(s): Phalen's test:deferred Tinel's test:deferred Apley's scratch test (touch opposite shoulder): Action 1 (Across chest):Decreased ROM, improved after treatment Action 2 (Overhead):Decreased ROM, improved after treatment Action 3 (LB reach):Decreased ROM, improved after treatment   Provocative Test(s): Phalen's test:deferred Tinel's test:deferred Apley's scratch test (touch opposite shoulder): Action 1 (Across chest):deferred Action 2 (Overhead):deferred Action 3 (LB reach):deferred  Lumbar Spine Area Exam  Skin & Axial Inspection:No masses, redness, or swelling Alignment:Symmetrical Functional RKY:HCWC restricted ROMright greater than left  Stability:No instability detected Muscle Tone/Strength:Functionally intact. No obvious neuro-muscular anomalies detected. Sensory (Neurological):Dermatomal pain patternon the right Palpation:No palpable anomalies Provocative Tests: Hyperextension/rotation test:(+)bilaterally for facet joint pain.,  Improved after treatment Lumbar quadrant test (Kemp's test):(+)on the  right for foraminal stenosis  *(Flexion, ABduction and External Rotation) Gait & Posture Assessment  Ambulation:Unassisted Gait:Relatively normal for age and body habitus Posture:WNL Lower Extremity Exam    Side:Right lower extremity  Side:Left lower extremity  Stability:No instability observed  Stability:No instability observed  Skin & Extremity Inspection:Skin color, temperature, and hair growth are WNL. No peripheral edema or cyanosis. No masses, redness, swelling, asymmetry, or associated skin lesions. No contractures.  Skin & Extremity Inspection:Skin color, temperature, and hair growth are WNL. No peripheral edema or cyanosis. No masses, redness, swelling, asymmetry, or associated skin lesions. No contractures.  Functional BJS:EGBT restricted ROMfor hip and knee joints Limited SLR (straight leg raise)  Functional DVV:OHYW restricted ROMfor hip and knee joints   Muscle Tone/Strength:Functionally intact. No obvious neuro-muscular anomalies detected.  Muscle Tone/Strength:Functionally intact. No obvious neuro-muscular anomalies detected.  Sensory (Neurological):Unimpaired  Sensory (Neurological):Unimpaired  DTR: Patellar:deferred today Achilles:deferred today Plantar:deferred today  DTR: Patellar:deferred today Achilles:deferred today Plantar:deferred today  Palpation:No palpable anomalies  Palpation:No palpable anomalies     Assessment   Status Diagnosis  Persistent Persistent Persistent 1. Bilateral primary osteoarthritis of knee   2. Lumbar facet arthropathy   3. Right rotator cuff tear arthropathy   4. Chronic right shoulder pain   5. Primary osteoarthritis of right shoulder   6. Chronic pain syndrome       Plan of Care   [name] has a current medication list which includes the following long-term medication(s): omeprazole, duloxetine, and gabapentin.  Pharmacotherapy  (Medications Ordered): Meds ordered this encounter  Medications  . DULoxetine (CYMBALTA) 60 MG capsule    Sig: Take 1 capsule (60 mg total) by mouth daily.    Dispense:  30 capsule    Refill:  2  . gabapentin (NEURONTIN) 300 MG capsule    Sig: Take 1 capsule (300 mg total) by mouth 2 (two) times daily.    Dispense:  60 capsule    Refill:  2  . HYDROcodone-acetaminophen (NORCO/VICODIN) 5-325 MG tablet    Sig: Take 1 tablet by mouth every 6 (six) hours as needed for moderate pain.    Dispense:  120 tablet    Refill:  0    For chronic pain syndrome   Orders:  Orders Placed This Encounter  Procedures  . SHOULDER INJECTION    Standing Status:   Future    Standing Expiration Date:   04/18/2021    Scheduling Instructions:     Side: RIGHT     Sedation:w/o     Timeframe: As soon as schedule allows    Order Specific Question:   Where will this procedure be performed?    Answer:   [facility]    Comments:   [facility] Payer   Follow-up plan:   Return in about 2 weeks (around 02/02/2021) for R shoulder injection .     s/p IA hyalgan #1 on 08/19/2019, #2 09/23/2019 not very effective unfortunately.  Lumbar MRI shows facet disease at L3, L4, L5 bilaterally.  Bilateral L3, L4, L5 medial branch nerve block #1; right glenohumeral shoulder joint injection (anterior approach) 10/19/2020 helped significantly, repeat as needed, bilateral knee  steroid injection-helped x 1 week            Recent Visits Date Type Provider Dept  12/07/20 Procedure visit Gillis Santa, MD [facility]  11/21/20 Office Visit Gillis Santa, MD [facility]  Showing recent visits within past 90 days and meeting all other requirements Today's Visits Date Type Provider Dept  01/19/21 Office Visit Gillis Santa, MD [facility]  Showing today's visits and meeting all other requirements Future Appointments Date Type Provider Dept  03/14/21 Appointment Gillis Santa, MD [facility]  Showing  future appointments within next 90 days and meeting all other requirements  I discussed the assessment and treatment plan with the patient. The patient was provided an opportunity to ask questions and all were answered. The patient agreed with the plan and demonstrated an understanding of the instructions.  Patient advised to call back or seek an in-person evaluation if the symptoms or condition worsens.  Duration of encounter: 40mnutes.  Note by: BGillis Santa MD Date: 01/19/2021; Time: 9:33 AM

## 2021-02-01 ENCOUNTER — Other Ambulatory Visit: Payer: Self-pay

## 2021-02-01 ENCOUNTER — Ambulatory Visit (INDEPENDENT_AMBULATORY_CARE_PROVIDER_SITE_OTHER): Payer: 59 | Admitting: Family Medicine

## 2021-02-01 VITALS — BP 130/90 | HR 104 | Temp 98.7°F | Ht 68.0 in | Wt 222.8 lb

## 2021-02-01 DIAGNOSIS — I10 Essential (primary) hypertension: Secondary | ICD-10-CM

## 2021-02-01 DIAGNOSIS — R7303 Prediabetes: Secondary | ICD-10-CM

## 2021-02-01 HISTORY — DX: Essential (primary) hypertension: I10

## 2021-02-01 HISTORY — DX: Prediabetes: R73.03

## 2021-02-01 MED ORDER — LOSARTAN POTASSIUM 50 MG PO TABS: 50.0000 mg | ORAL_TABLET | Freq: Every day | ORAL | 3 refills | Status: DC

## 2021-02-01 NOTE — Progress Notes (Signed)
Tommi Rumps, MD Phone: [phone]  KABAO LEITE is a 60 y.o. female who presents today for same day visit.   Hypertension: Patient's blood pressure has been elevated over numerous checks going back a number of months.  Notes it was 144/96 yesterday.  She has been having intermittent headaches with this.  Has occasionally felt swimmy headed.  Occasionally feels as though she needs to take a deep breath though notes no shortness of breath or chest pain.  Some chronic edema possibly related to musculoskeletal issues.  No numbness or weakness.  She does report a strong family history of hypertension in her parents.  She does take Cymbalta and reports she started this last summer.  Social History   Tobacco Use  Smoking Status Former Smoker  . Types: Cigarettes  . Quit date: 01/28/2003  . Years since quitting: 18.0  Smokeless Tobacco Never Used    Current Outpatient Medications on File Prior to Visit  Medication Sig Dispense Refill  . DULoxetine (CYMBALTA) 60 MG capsule Take 1 capsule (60 mg total) by mouth daily. 30 capsule 2  . gabapentin (NEURONTIN) 300 MG capsule Take 1 capsule (300 mg total) by mouth 2 (two) times daily. 60 capsule 2  . HYDROcodone-acetaminophen (NORCO/VICODIN) 5-325 MG tablet Take 1 tablet by mouth every 6 (six) hours as needed for moderate pain. 120 tablet 0  . Melatonin 5 MG CAPS Take 1 capsule by mouth daily.    . Multiple Vitamin (MULTIVITAMIN) tablet Take 1 tablet by mouth daily.    Marland Kitchen omeprazole (PRILOSEC) 20 MG capsule TAKE 1 CAPSULE BY MOUTH EVERY DAY 90 capsule 1  . valACYclovir (VALTREX) 500 MG tablet Take 500 mg by mouth as needed.      No current facility-administered medications on file prior to visit.     ROS see history of present illness  Objective  Physical Exam Vitals:   02/01/21 1511  BP: 130/90  Pulse: (!) 104  Temp: 98.7 F (37.1 C)  SpO2: 99%    BP Readings from Last 3 Encounters:  02/01/21 130/90  01/19/21 (!) 149/99   12/07/20 (!) 164/106   Wt Readings from Last 3 Encounters:  02/01/21 222 lb 12.8 oz (101.1 kg)  01/19/21 210 lb (95.3 kg)  12/07/20 220 lb (99.8 kg)    Physical Exam Constitutional:      General: She is not in acute distress.    Appearance: She is not diaphoretic.  Cardiovascular:     Rate and Rhythm: Normal rate and regular rhythm.     Heart sounds: Normal heart sounds.  Pulmonary:     Effort: Pulmonary effort is normal.     Breath sounds: Normal breath sounds.  Abdominal:     General: Bowel sounds are normal. There is no distension.     Palpations: Abdomen is soft.     Tenderness: There is no abdominal tenderness. There is no guarding or rebound.     Comments: No abdominal bruits  Musculoskeletal:        General: No edema.     Right lower leg: No edema.     Left lower leg: No edema.  Skin:    General: Skin is warm and dry.  Neurological:     Mental Status: She is alert.    EKG: Normal sinus rhythm, rate 86, no ischemic changes  Assessment/Plan: Please see individual problem list.  Problem List Items Addressed This Visit    Hypertension - Primary    Patient's blood pressure meets criteria for  hypertension.  Discussed that Cymbalta could be playing a role in this though could also be related to her family history and predisposition for hypertension.  We will go ahead and start losartan for this.  We will check lab work today as outlined.  I will check with her pain specialist regarding tapering down on Cymbalta to see if that is playing a role.  EKG to be completed today as well.  Advised to seek medical attention for chest pain, shortness of breath, worsening headaches, numbness, or weakness.  Follow-up labs and nurse BP check in 1 week.      Relevant Medications   losartan (COZAAR) 50 MG tablet   Other Relevant Orders   EKG 12-Lead   Comp Met (CMET)   TSH   CBC   Basic Metabolic Panel (BMET)   Prediabetes    Check A1c.      Relevant Orders   HgB A1c       This visit occurred during the SARS-CoV-2 public health emergency.  Safety protocols were in place, including screening questions prior to the visit, additional usage of staff PPE, and extensive cleaning of exam room while observing appropriate contact time as indicated for disinfecting solutions.    Tommi Rumps, MD [facility]

## 2021-02-01 NOTE — Patient Instructions (Signed)
Nice to see you. We will start losartan 50 mg once daily for your blood pressure.  I will contact your pain specialist regarding the Cymbalta and see if we can taper down on this.  We will have you return in 1 week for additional labs and a blood pressure check with nursing.

## 2021-02-01 NOTE — Assessment & Plan Note (Addendum)
Patient's blood pressure meets criteria for hypertension.  Discussed that Cymbalta could be playing a role in this though could also be related to her family history and predisposition for hypertension.  We will go ahead and start losartan for this.  We will check lab work today as outlined.  I will check with her pain specialist regarding tapering down on Cymbalta to see if that is playing a role.  EKG to be completed today as well.  Advised to seek medical attention for chest pain, shortness of breath, worsening headaches, numbness, or weakness.  Follow-up labs and nurse BP check in 1 week.

## 2021-02-01 NOTE — Assessment & Plan Note (Signed)
Check A1c. 

## 2021-02-02 ENCOUNTER — Telehealth: Payer: Self-pay | Admitting: Family Medicine

## 2021-02-02 LAB — HEMOGLOBIN A1C: Hgb A1c MFr Bld: 5.6 % (ref 4.6–6.5)

## 2021-02-02 LAB — CBC
HCT: 41.2 % (ref 36.0–46.0)
Hemoglobin: 13.7 g/dL (ref 12.0–15.0)
MCHC: 33.2 g/dL (ref 30.0–36.0)
MCV: 83.8 fl (ref 78.0–100.0)
Platelets: 231 10*3/uL (ref 150.0–400.0)
RBC: 4.92 Mil/uL (ref 3.87–5.11)
RDW: 14.5 % (ref 11.5–15.5)
WBC: 5.6 10*3/uL (ref 4.0–10.5)

## 2021-02-02 LAB — TSH: TSH: 1.03 u[IU]/mL (ref 0.35–4.50)

## 2021-02-02 LAB — COMPREHENSIVE METABOLIC PANEL
ALT: 18 U/L (ref 0–35)
AST: 18 U/L (ref 0–37)
Albumin: 4.1 g/dL (ref 3.5–5.2)
Alkaline Phosphatase: 81 U/L (ref 39–117)
BUN: 10 mg/dL (ref 6–23)
CO2: 32 mEq/L (ref 19–32)
Calcium: 9.9 mg/dL (ref 8.4–10.5)
Chloride: 104 mEq/L (ref 96–112)
Creatinine, Ser: 0.92 mg/dL (ref 0.40–1.20)
GFR: 69.13 mL/min (ref >60.00)
Glucose, Bld: 98 mg/dL (ref 70–99)
Potassium: 4.1 mEq/L (ref 3.5–5.1)
Sodium: 139 mEq/L (ref 135–145)
Total Bilirubin: 0.4 mg/dL (ref 0.2–1.2)
Total Protein: 6.6 g/dL (ref 6.0–8.3)

## 2021-02-02 MED ORDER — DULOXETINE HCL 30 MG PO CPEP: 30.0000 mg | ORAL_CAPSULE | Freq: Every day | ORAL | 0 refills | Status: DC

## 2021-02-02 NOTE — Telephone Encounter (Signed)
-----   Message from Gillis Santa, MD sent at 02/01/2021  4:57 PM EST ----- Yes that's fine Randall Hiss. Thanks for checking. Hope all is well! ----- Message ----- From: Leone Haven, MD Sent: 02/01/2021   3:18 PM EST To: Gillis Santa, MD  Hi Carlus Pavlov,   I saw [name] today for evaluation of her BP. It has been elevated for a number of months now. I am getting her started on losartan for this though noted she is on cymbalta which can occasionally lead to elevated BP. It sounds as though this has been helpful for her chronic pain issues, though I wanted to see if you were ok with me tapering her off of this medication to see if it is contributing to her elevated BP. Thanks.   Randall Hiss

## 2021-02-02 NOTE — Telephone Encounter (Signed)
I called and LVM for the patient to call back. Liah Morr,cma   

## 2021-02-02 NOTE — Telephone Encounter (Signed)
Please let the patient know that I heard back from Dr. Holley Raring regarding the Cymbalta.  We will proceed with tapering her off of this to see if her blood pressure improves.  She will go down to 30 mg once daily for 2 weeks and then discontinue the Cymbalta.  I sent a new prescription to her pharmacy.  Please make sure she has follow-up with me in about 4-6 weeks.  Thanks.

## 2021-02-07 NOTE — Telephone Encounter (Signed)
Patient returned a call back and I informed her how to taper off the Cymbalta and she understood, I also informed her that a new prescription was sent to her pharmacy and the patient scheduled to see the provider in 4-6 weeks per provider.  Joann Jorge,cma

## 2021-02-09 ENCOUNTER — Ambulatory Visit (INDEPENDENT_AMBULATORY_CARE_PROVIDER_SITE_OTHER): Payer: 59

## 2021-02-09 ENCOUNTER — Other Ambulatory Visit: Payer: Self-pay

## 2021-02-09 ENCOUNTER — Telehealth: Payer: Self-pay | Admitting: Internal Medicine

## 2021-02-09 DIAGNOSIS — I10 Essential (primary) hypertension: Secondary | ICD-10-CM

## 2021-02-09 LAB — BASIC METABOLIC PANEL
BUN: 13 mg/dL (ref 6–23)
CO2: 30 mEq/L (ref 19–32)
Calcium: 10 mg/dL (ref 8.4–10.5)
Chloride: 105 mEq/L (ref 96–112)
Creatinine, Ser: 0.86 mg/dL (ref 0.40–1.20)
GFR: 74.95 mL/min (ref >60.00)
Glucose, Bld: 95 mg/dL (ref 70–99)
Potassium: 4.6 mEq/L (ref 3.5–5.1)
Sodium: 140 mEq/L (ref 135–145)

## 2021-02-09 NOTE — Progress Notes (Signed)
Patient is here for a BP check due to bp being high at last visit, as per patient.  Currently patients BP is 133/83 and BPM is 93.  Patient has no complaints of headaches, blurry vision, chest pain, arm pain, light headedness, dizziness, and nor jaw pain. Please see previous note for order.

## 2021-02-09 NOTE — Telephone Encounter (Signed)
Agree Dr. Karlyn Agee Scocuzza  BP improved inform pt continue current meds    F/u with providers here until can establish with new PCP end of 03/2021 or 04/2021 -can we place on new Nps schedule now?

## 2021-02-10 NOTE — Telephone Encounter (Signed)
Patient has been informed.

## 2021-02-17 ENCOUNTER — Other Ambulatory Visit: Payer: Self-pay | Admitting: Internal Medicine

## 2021-02-17 ENCOUNTER — Telehealth: Payer: Self-pay | Admitting: Nurse Practitioner

## 2021-02-17 DIAGNOSIS — I10 Essential (primary) hypertension: Secondary | ICD-10-CM

## 2021-02-17 MED ORDER — LOSARTAN POTASSIUM-HCTZ 50-12.5 MG PO TABS: 1.0000 | ORAL_TABLET | Freq: Every day | ORAL | 3 refills | Status: DC

## 2021-02-17 NOTE — Telephone Encounter (Signed)
Patient stated she is ok with adding a fluid pill. She stated she has an appointment with Dr. Caryl Bis coming up for her BP.

## 2021-02-17 NOTE — Telephone Encounter (Signed)
Does she want to add fluid pill to her losartan 50 mg?  She will need to f/u with Dr. Caryl Bis and take in the am?  Hctz 12.5 mg daily  Check BP and call back in 1 week if comes in combo pill?

## 2021-02-17 NOTE — Telephone Encounter (Signed)
Pt called her BP is 150/100 and 145/99 readings taken 10 minutes apart she is having a headache

## 2021-02-23 ENCOUNTER — Telehealth: Payer: Self-pay

## 2021-02-23 NOTE — Telephone Encounter (Signed)
Pt called her BP this morning was 160/101 and wanted to know if she needed to added an additional medication.
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
## 2021-02-24 ENCOUNTER — Encounter: Payer: Self-pay | Admitting: Emergency Medicine

## 2021-02-24 ENCOUNTER — Other Ambulatory Visit: Payer: Self-pay

## 2021-02-24 ENCOUNTER — Emergency Department: Payer: 59

## 2021-02-24 ENCOUNTER — Emergency Department
Admission: EM | Admit: 2021-02-24 | Discharge: 2021-02-24 | Disposition: A | Discharge status: Home / Self Care | Payer: 59 | Attending: Emergency Medicine | Admitting: Emergency Medicine

## 2021-02-24 DIAGNOSIS — Z79899 Other long term (current) drug therapy: Secondary | ICD-10-CM | POA: Insufficient documentation

## 2021-02-24 DIAGNOSIS — R42 Dizziness and giddiness: Secondary | ICD-10-CM | POA: Diagnosis not present

## 2021-02-24 DIAGNOSIS — I10 Essential (primary) hypertension: Secondary | ICD-10-CM | POA: Diagnosis not present

## 2021-02-24 DIAGNOSIS — Z87891 Personal history of nicotine dependence: Secondary | ICD-10-CM | POA: Diagnosis not present

## 2021-02-24 LAB — BASIC METABOLIC PANEL
Anion gap: 7 (ref 5–15)
BUN: 10 mg/dL (ref 6–20)
CO2: 24 mmol/L (ref 22–32)
Calcium: 9.8 mg/dL (ref 8.9–10.3)
Chloride: 107 mmol/L (ref 98–111)
Creatinine, Ser: 0.83 mg/dL (ref 0.44–1.00)
GFR, Estimated: >60 mL/min (ref >60)
Glucose, Bld: 95 mg/dL (ref 70–99)
Potassium: 3.9 mmol/L (ref 3.5–5.1)
Sodium: 138 mmol/L (ref 135–145)

## 2021-02-24 LAB — CBC
HCT: 43.8 % (ref 36.0–46.0)
Hemoglobin: 14.3 g/dL (ref 12.0–15.0)
MCH: 27.4 pg (ref 26.0–34.0)
MCHC: 32.6 g/dL (ref 30.0–36.0)
MCV: 83.9 fL (ref 80.0–100.0)
Platelets: 253 10*3/uL (ref 150–400)
RBC: 5.22 MIL/uL — ABNORMAL HIGH (ref 3.87–5.11)
RDW: 13.6 % (ref 11.5–15.5)
WBC: 6 10*3/uL (ref 4.0–10.5)
nRBC: 0 % (ref 0.0–0.2)

## 2021-02-24 MED ORDER — ONDANSETRON HCL 4 MG/2ML IJ SOLN
4.0000 mg | Freq: Once | INTRAMUSCULAR | Status: AC
  Administered 2021-02-24: 4 mg via INTRAVENOUS
  Filled 2021-02-24: qty 2

## 2021-02-24 MED ORDER — SODIUM CHLORIDE 0.9 % IV SOLN
1000.0000 mL | Freq: Once | INTRAVENOUS | Status: AC
  Administered 2021-02-24: 1000 mL via INTRAVENOUS
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
## 2021-02-24 NOTE — Discharge Instructions (Addendum)
I recommend holding your losartan as we discussed  Please discuss outpatient f/u MRI with your primary, this is not emergent

## 2021-02-24 NOTE — ED Triage Notes (Signed)
Pt to ED via POV stating that she is having dizziness and high blood pressure. Pt states that she has been having issues with her blood pressure being high and was told by her PCP to come to the ED yesterday but she started feeling better so she did not come. Pt states that she sees a "light" or a "line" in her left eye. Pt states that this comes and goes, sometimes its more of a light and sometimes more of a line. Pt states that currently it is a line. Pt is on Losartan. Pt has taken her medication this morning. Pt is in NAD.

## 2021-03-13 ENCOUNTER — Ambulatory Visit
Admission: RE | Admit: 2021-03-13 | Discharge: 2021-03-13 | Disposition: A | Discharge status: Home / Self Care | Payer: 59 | Source: Ambulatory Visit | Attending: Student in an Organized Health Care Education/Training Program | Admitting: Student in an Organized Health Care Education/Training Program

## 2021-03-13 ENCOUNTER — Other Ambulatory Visit: Payer: Self-pay

## 2021-03-13 ENCOUNTER — Encounter: Payer: Self-pay | Admitting: Student in an Organized Health Care Education/Training Program

## 2021-03-13 ENCOUNTER — Ambulatory Visit (HOSPITAL_BASED_OUTPATIENT_CLINIC_OR_DEPARTMENT_OTHER): Payer: 59 | Admitting: Student in an Organized Health Care Education/Training Program

## 2021-03-13 VITALS — BP 128/88 | HR 91 | Temp 96.4°F | Resp 19 | Ht 67.0 in | Wt 210.0 lb

## 2021-03-13 DIAGNOSIS — M47816 Spondylosis without myelopathy or radiculopathy, lumbar region: Secondary | ICD-10-CM

## 2021-03-13 DIAGNOSIS — G894 Chronic pain syndrome: Secondary | ICD-10-CM | POA: Insufficient documentation

## 2021-03-13 DIAGNOSIS — M75101 Unspecified rotator cuff tear or rupture of right shoulder, not specified as traumatic: Secondary | ICD-10-CM

## 2021-03-13 DIAGNOSIS — G8929 Other chronic pain: Secondary | ICD-10-CM | POA: Insufficient documentation

## 2021-03-13 DIAGNOSIS — M25511 Pain in right shoulder: Secondary | ICD-10-CM

## 2021-03-13 DIAGNOSIS — M19011 Primary osteoarthritis, right shoulder: Secondary | ICD-10-CM | POA: Diagnosis not present

## 2021-03-13 DIAGNOSIS — M12811 Other specific arthropathies, not elsewhere classified, right shoulder: Secondary | ICD-10-CM | POA: Insufficient documentation

## 2021-03-13 MED ORDER — DEXAMETHASONE SODIUM PHOSPHATE 10 MG/ML IJ SOLN
10.0000 mg | Freq: Once | INTRAMUSCULAR | Status: AC
  Administered 2021-03-13: 10 mg

## 2021-03-13 MED ORDER — LIDOCAINE HCL 2 % IJ SOLN
20.0000 mL | Freq: Once | INTRAMUSCULAR | Status: AC
  Administered 2021-03-13: 100 mg

## 2021-03-13 MED ORDER — PREGABALIN 50 MG PO CAPS: ORAL_CAPSULE | ORAL | 1 refills | Status: DC

## 2021-03-13 MED ORDER — IOHEXOL 180 MG/ML  SOLN
10.0000 mL | Freq: Once | INTRAMUSCULAR | Status: AC
  Administered 2021-03-13: 10 mL via INTRA_ARTICULAR

## 2021-03-13 MED ORDER — ROPIVACAINE HCL 2 MG/ML IJ SOLN
4.0000 mL | Freq: Once | INTRAMUSCULAR | Status: AC
  Administered 2021-03-13: 10 mL via INTRA_ARTICULAR

## 2021-03-13 MED ORDER — HYDROCODONE-ACETAMINOPHEN 5-325 MG PO TABS: 1.0000 | ORAL_TABLET | Freq: Four times a day (QID) | ORAL | 0 refills | Status: AC | PRN

## 2021-03-13 NOTE — Patient Instructions (Signed)

## 2021-03-13 NOTE — Progress Notes (Signed)
Nursing Pain Medication Assessment:  Safety precautions to be maintained throughout the outpatient stay will include: orient to surroundings, keep bed in low position, maintain call bell within reach at all times, provide assistance with transfer out of bed and ambulation.  Medication Inspection Compliance: Pill count conducted under aseptic conditions, in front of the patient. Neither the pills nor the bottle was removed from the patient's sight at any time. Once count was completed pills were immediately returned to the patient in their original bottle.  Medication: Hydrocodone/APAP Pill/Patch Count: 21 of 120 pills remain Pill/Patch Appearance: Markings consistent with prescribed medication Bottle Appearance: Standard pharmacy container. Clearly labeled. Filled Date:02 / 13 / 2022 Last Medication intake:  Today

## 2021-03-13 NOTE — Progress Notes (Signed)
ROM in right arm improved after injection

## 2021-03-13 NOTE — Progress Notes (Signed)
PROVIDER NOTE: Information contained herein reflects review and annotations entered in association with encounter. Interpretation of such information and data should be left to medically-trained personnel. Information provided to patient can be located elsewhere in the medical record under "Patient Instructions". Document created using STT-dictation technology, any transcriptional errors that may result from process are unintentional.    Patient: [name]  Service Category: E/M  Provider: Gillis Santa, MD  DOB: January 26, 1961  DOS: 03/13/2021  Specialty: Interventional Pain Management  MRN: [id number]  Setting: Ambulatory outpatient  PCP: Marval Regal, NP  Type: Established Patient    Referring Provider: Marval Regal, NP  Location: Office  Delivery: Face-to-face     HPI  [name], a 60 y.o. year old female, is here today because of her Chronic right shoulder pain [M25.511, G89.29]. Ms. Sieg primary complain today is Shoulder Pain (right) Last encounter: My last encounter with her was on 01/19/2021. Pertinent problems: Ms. Nakama has Obesity; Arthritis; Chronic pain syndrome; Lumbar spondylosis; Bilateral primary osteoarthritis of knee; Bilateral hip pain; Chronic SI joint pain; Lumbar radiculopathy; and Long-term current use of opiate analgesic on their pertinent problem list. Pain Assessment: Severity of Chronic pain is reported as a 9 /10. Location: Shoulder Right/denies. Onset: More than a month ago. Quality: Sharp. Timing: Constant. Modifying factor(s): rest. Vitals:  height is '5\' 7"'  (1.702 m) and weight is 210 lb (95.3 kg). Her temperature is 96.4 F (35.8 C) (abnormal). Her blood pressure is 128/88 and her pulse is 91. Her respiration is 19 and oxygen saturation is 96%.   Reason for encounter: Right glenohumeral steroid injection, see procedure note as well as medication management.   Since stopping her Cymbalta which she did gradually with her primary care provider,  Dr. Caryl Bis, patient has noticed an overall increase in her pain.  She is having trouble going out and performing basic ADLs.  Her Cymbalta was discontinued secondary to elevated blood pressures and she was placed on an antihypertensive.  Patient states that her quality of life is worse off given increased pain which she thinks is secondary to her being off of Cymbalta.  She is continuing gabapentin 600 mg nightly.  I recommend that she discontinue gabapentin and try Lyrica 100 mg nightly and 50 mg during the day to see if that has a positive effect.  If it does not, I will touch base with Dr. Caryl Bis to see if we can restart Cymbalta at a lower dose between 30 to 40 mg as this helps Debbe manage her pain.  Otherwise we will refill her hydrocodone, quantity 60/month.  She takes these twice a day as needed for severe pain.  Pharmacotherapy Assessment   Analgesic: hydrocodone 5 mg twice daily as needed, quantity 60/month    Monitoring: [place] PMP: PDMP not reviewed this encounter.       Pharmacotherapy: No side-effects or adverse reactions reported. Compliance: No problems identified. Effectiveness: Clinically acceptable.  Landis Martins, RN  03/13/2021 10:35 AM  Sign when Signing Visit ROM in right arm improved after injection  [name], RN  03/13/2021  9:48 AM  Signed Nursing Pain Medication Assessment:  Safety precautions to be maintained throughout the outpatient stay will include: orient to surroundings, keep bed in low position, maintain call bell within reach at all times, provide assistance with transfer out of bed and ambulation.  Medication Inspection Compliance: Pill count conducted under aseptic conditions, in front of the patient. Neither the pills nor the bottle was  removed from the patient's sight at any time. Once count was completed pills were immediately returned to the patient in their original bottle.  Medication: Hydrocodone/APAP Pill/Patch Count: 21 of 120 pills  remain Pill/Patch Appearance: Markings consistent with prescribed medication Bottle Appearance: Standard pharmacy container. Clearly labeled. Filled Date:02 / 13 / 2022 Last Medication intake:  Today    UDS:  Summary  Date Value Ref Range Status  05/18/2020 Note  Final    Comment:    ==================================================================== [employer] 13 (MW) ==================================================================== Test                             Result       Flag       Units Drug Present and Declared for Prescription Verification   Hydrocodone                    223          EXPECTED   ng/mg creat   Norhydrocodone                 487          EXPECTED   ng/mg creat    Sources of hydrocodone include scheduled prescription medications.    Norhydrocodone is an expected metabolite of hydrocodone. ==================================================================== Test                      Result    Flag   Units      Ref Range   Creatinine              31               mg/dL      >=20 ==================================================================== Declared Medications:  The flagging and interpretation on this report are based on the  following declared medications.  Unexpected results may arise from  inaccuracies in the declared medications.  **Note: The testing scope of this panel includes these medications:  Hydrocodone (Norco)  **Note: The testing scope of this panel does not include the  following reported medications:  Acetaminophen (Norco)  Azithromycin (Zithromax)  Duloxetine (Cymbalta)  Gabapentin (Neurontin)  Melatonin  Naproxen (Aleve)  Omeprazole (Prilosec)  Ondansetron (Zofran)  Topical  Turmeric  Valacyclovir (Valtrex) ==================================================================== For clinical consultation, please call [phone]. ====================================================================      ROS   Constitutional: Denies any fever or chills Gastrointestinal: No reported hemesis, hematochezia, vomiting, or acute GI distress Musculoskeletal: Diffuse arthralgias and myalgias Neurological: No reported episodes of acute onset apraxia, aphasia, dysarthria, agnosia, amnesia, paralysis, loss of coordination, or loss of consciousness  Medication Review  HYDROcodone-acetaminophen, Melatonin, losartan-hydrochlorothiazide, multivitamin, omeprazole, pregabalin, and valACYclovir  History Review  Allergy: Ms. Furrow has No Known Allergies. Drug: Ms. Fine  reports no history of drug use. Alcohol:  reports previous alcohol use. Tobacco:  reports that she quit smoking about 18 years ago. Her smoking use included cigarettes. She has never used smokeless tobacco. Social: Ms. Pickler  reports that she quit smoking about 18 years ago. Her smoking use included cigarettes. She has never used smokeless tobacco. She reports previous alcohol use. She reports that she does not use drugs. Medical:  has a past medical history of Arthritis, Degenerative disorder of bone, Genuine stress incontinence, female (2012), GERD (gastroesophageal reflux disease), Migraines, and Obesity. Surgical: Ms. Leanos  has a past surgical history that includes Laparoscopic hysterectomy (10/23/2010); Midurethral sling (05/02/2011); and excision of vulvar  lesion (05/02/2011). Family: family history includes Arthritis in her maternal grandmother and mother; Cancer in her father and paternal grandfather; Diabetes in her son; Heart attack in her maternal grandfather, mother, and paternal grandmother; Hypertension in her mother.  Laboratory Chemistry Profile   Renal Lab Results  Component Value Date   BUN 10 02/24/2021   CREATININE 0.83 02/24/2021   BCR 12 06/16/2018   GFR 74.95 02/09/2021   GFRAA 92 06/16/2018   GFRNONAA >60 02/24/2021     Hepatic Lab Results  Component Value Date   AST 18 02/01/2021   ALT 18 02/01/2021   ALBUMIN  4.1 02/01/2021   ALKPHOS 81 02/01/2021     Electrolytes Lab Results  Component Value Date   NA 138 02/24/2021   K 3.9 02/24/2021   CL 107 02/24/2021   CALCIUM 9.8 02/24/2021   MG 1.8 01/30/2019     Bone Lab Results  Component Value Date   VD25OH 41.01 10/28/2020     Inflammation (CRP: Acute Phase) (ESR: Chronic Phase) No results found for: CRP, ESRSEDRATE, LATICACIDVEN     Note: Above Lab results reviewed.  Recent Imaging Review  DG PAIN CLINIC C-ARM 1-60 MIN NO REPORT Fluoro was used, but no Radiologist interpretation will be provided.  Please refer to "NOTES" tab for provider progress note. Note: Reviewed        Physical Exam  General appearance: Well nourished, well developed, and well hydrated. In no apparent acute distress Mental status: Alert, oriented x 3 (person, place, & time)       Respiratory: No evidence of acute respiratory distress Eyes: PERLA Vitals: BP 128/88   Pulse 91   Temp (!) 96.4 F (35.8 C)   Resp 19   Ht '5\' 7"'  (1.702 m)   Wt 210 lb (95.3 kg)   SpO2 96%   BMI 32.89 kg/m  BMI: Estimated body mass index is 32.89 kg/m as calculated from the following:   Height as of this encounter: '5\' 7"'  (1.702 m).   Weight as of this encounter: 210 lb (95.3 kg). Ideal: Ideal body weight: 61.6 kg (135 lb 12.9 oz) Adjusted ideal body weight: 75.1 kg (165 lb 7.7 oz)  Upper Extremity (UE) Exam    Side: Right upper extremity  Side: Left upper extremity  Skin & Extremity Inspection: Skin color, temperature, and hair growth are WNL. No peripheral edema or cyanosis. No masses, redness, swelling, asymmetry, or associated skin lesions. No contractures.  Skin & Extremity Inspection: Skin color, temperature, and hair growth are WNL. No peripheral edema or cyanosis. No masses, redness, swelling, asymmetry, or associated skin lesions. No contractures.  Functional ROM: Pain restricted ROM for shoulder  Functional ROM: Unrestricted ROM          Muscle Tone/Strength:  Functionally intact. No obvious neuro-muscular anomalies detected.  Muscle Tone/Strength: Functionally intact. No obvious neuro-muscular anomalies detected.  Sensory (Neurological): Arthropathic arthralgia affecting the shoulder  Sensory (Neurological): Unimpaired          Palpation: No palpable anomalies              Palpation: No palpable anomalies              Provocative Test(s):  Phalen's test: deferred Tinel's test: deferred Apley's scratch test (touch opposite shoulder):  Action 1 (Across chest): Decreased ROM Action 2 (Overhead): Decreased ROM Action 3 (LB reach): Decreased ROM   Provocative Test(s):  Phalen's test: deferred Tinel's test: deferred Apley's scratch test (touch opposite shoulder):  Action 1 (Across  chest): deferred Action 2 (Overhead): deferred Action 3 (LB reach): deferred     Assessment   Status Diagnosis  Worsening Worsening Worsening 1. Chronic right shoulder pain   2. Primary osteoarthritis of right shoulder   3. Right rotator cuff tear arthropathy   4. Lumbar facet arthropathy   5. Lumbar spondylosis   6. Chronic pain syndrome       Plan of Care   [name] has a current medication list which includes the following long-term medication(s): losartan-hydrochlorothiazide, omeprazole, and pregabalin.  1.  Discontinue gabapentin.  Start Lyrica 100 mg nightly, 50 mg daily. 2.  Refill hydrocodone as below.  5 mg twice daily as needed, quantity 60/month.  No change in dose.  UDS up-to-date and appropriate. 3.  Consider restarting Cymbalta between 20 to 40 mg if no benefit with Lyrica. 4.  Plan for right anterior glenohumeral joint injection under fluoroscopy, see procedure note 5.  Follow-up in 4 weeks for medication management and post procedure evaluation.  Pharmacotherapy (Medications Ordered): Meds ordered this encounter  Medications  . lidocaine (XYLOCAINE) 2 % (with pres) injection 400 mg  . iohexol (OMNIPAQUE) 180 MG/ML injection  10 mL    Must be Myelogram-compatible. If not available, you may substitute with a water-soluble, non-ionic, hypoallergenic, myelogram-compatible radiological contrast medium.  . ropivacaine (PF) 2 mg/mL (0.2%) (NAROPIN) injection 4 mL  . dexamethasone (DECADRON) injection 10 mg  . HYDROcodone-acetaminophen (NORCO/VICODIN) 5-325 MG tablet    Sig: Take 1 tablet by mouth every 6 (six) hours as needed for severe pain. Must last 30 days.    Dispense:  120 tablet    Refill:  0    Chronic Pain. (STOP Act - Not applicable). Fill one day early if closed on scheduled refill date.  . pregabalin (LYRICA) 50 MG capsule    Sig: 50 mg qAM, 100 mg qhs    Dispense:  90 capsule    Refill:  1    Fill one day early if pharmacy is closed on scheduled refill date. May substitute for generic if available.   Orders:  Orders Placed This Encounter  Procedures  . DG PAIN CLINIC C-ARM 1-60 MIN NO REPORT    Intraoperative interpretation by procedural physician at [place].    Standing Status:   Standing    Number of Occurrences:   1    Order Specific Question:   Reason for exam:    Answer:   Assistance in needle guidance and placement for procedures requiring needle placement in or near specific anatomical locations not easily accessible without such assistance.   Follow-up plan:   Return in about 4 weeks (around 04/10/2021) for Post Procedure Evaluation, Medication Management.     s/p IA hyalgan #1 on 08/19/2019, #2 09/23/2019 not very effective unfortunately.  Lumbar MRI shows facet disease at L3, L4, L5 bilaterally.  Bilateral L3, L4, L5 medial branch nerve block #1; right glenohumeral shoulder joint injection (anterior approach) 10/19/2020 helped significantly, repeat as needed, bilateral knee steroid injection-helped x 1 week, right glenohumeral steroid joint injection #2 03/13/2021           Recent Visits Date Type Provider Dept  01/19/21 Office Visit Gillis Santa, MD [facility]  Showing  recent visits within past 90 days and meeting all other requirements Today's Visits Date Type Provider Dept  03/13/21 Procedure visit Gillis Santa, MD [facility]  Showing today's visits and meeting all other requirements Future Appointments Date Type Provider Dept  04/10/21 Appointment Gillis Santa, MD [facility]  Showing future appointments within next 90 days and meeting all other requirements  I discussed the assessment and treatment plan with the patient. The patient was provided an opportunity to ask questions and all were answered. The patient agreed with the plan and demonstrated an understanding of the instructions.  Patient advised to call back or seek an in-person evaluation if the symptoms or condition worsens.  Duration of encounter: 13mnutes.  Note by: BGillis Santa MD Date: 03/13/2021; Time: 12:01 PM

## 2021-03-13 NOTE — Progress Notes (Signed)
PROVIDER NOTE: Information contained herein reflects review and annotations entered in association with encounter. Interpretation of such information and data should be left to medically-trained personnel. Information provided to patient can be located elsewhere in the medical record under "Patient Instructions". Document created using STT-dictation technology, any transcriptional errors that may result from process are unintentional.    Patient: [name]  Service Category: Procedure  Provider: Gillis Santa, MD  DOB: October 24, 1961  DOS: 03/13/2021  Location: [facility]  MRN: [id number]  Setting: Ambulatory - outpatient  Referring Provider: Marval Regal, NP  Type: Established Patient  Specialty: Interventional Pain Management  PCP: Marval Regal, NP   Primary Reason for Visit: Interventional Pain Management Treatment. CC: Shoulder Pain (right)  Procedure:          Anesthesia, Analgesia, Anxiolysis:  Type: Therapeutic Glenohumeral Joint (shoulder) Injection #2  Primary Purpose: Diagnostic Region: Anterior Shoulder Area Level:  Shoulder Target Area: Glenohumeral Joint (shoulder) Approach: Anterolateral approach. Laterality: Right-Sided  Type: Local Anesthesia  Local Anesthetic: Lidocaine 1-2%  Position: Supine   Indications: Right rotator cuff dysfunction, right shoulder pain, right glenohumeral arthropathy.  Pain Score: Pre-procedure: 9 /10 Post-procedure: 9 /10   Pre-op Assessment:  Ms. Sanks is a 60 y.o. (year old), female patient, seen today for interventional treatment. She  has a past surgical history that includes Laparoscopic hysterectomy (10/23/2010); Midurethral sling (05/02/2011); and excision of vulvar lesion (05/02/2011). Ms. Turrubiates has a current medication list which includes the following prescription(s): [START ON 03/29/2021] hydrocodone-acetaminophen, losartan-hydrochlorothiazide, melatonin, multivitamin, omeprazole, pregabalin, and  valacyclovir. Her primarily concern today is the Shoulder Pain (right)  Initial Vital Signs:  Pulse/HCG Rate: 91ECG Heart Rate: 66 Temp: (!) 96.4 F (35.8 C) Resp: 16 BP: (!) 145/99 SpO2: 98 %  BMI: Estimated body mass index is 32.89 kg/m as calculated from the following:   Height as of this encounter: 5\' 7"  (1.702 m).   Weight as of this encounter: 210 lb (95.3 kg).  Risk Assessment: Allergies: Reviewed. She has No Known Allergies.  Allergy Precautions: None required Coagulopathies: Reviewed. None identified.  Blood-thinner therapy: None at this time Active Infection(s): Reviewed. None identified. Ms. Fingerhut is afebrile  Site Confirmation: Ms. Abbett was asked to confirm the procedure and laterality before marking the site Procedure checklist: Completed Consent: Before the procedure and under the influence of no sedative(s), amnesic(s), or anxiolytics, the patient was informed of the treatment options, risks and possible complications. To fulfill our ethical and legal obligations, as recommended by the American Medical Association's Code of Ethics, I have informed the patient of my clinical impression; the nature and purpose of the treatment or procedure; the risks, benefits, and possible complications of the intervention; the alternatives, including doing nothing; the risk(s) and benefit(s) of the alternative treatment(s) or procedure(s); and the risk(s) and benefit(s) of doing nothing. The patient was provided information about the general risks and possible complications associated with the procedure. These may include, but are not limited to: failure to achieve desired goals, infection, bleeding, organ or nerve damage, allergic reactions, paralysis, and death. In addition, the patient was informed of those risks and complications associated to the procedure, such as failure to decrease pain; infection; bleeding; organ or nerve damage with subsequent damage to sensory, motor, and/or  autonomic systems, resulting in permanent pain, numbness, and/or weakness of one or several areas of the body; allergic reactions; (i.e.: anaphylactic reaction); and/or death. Furthermore, the patient was informed of those risks and complications associated with the  medications. These include, but are not limited to: allergic reactions (i.e.: anaphylactic or anaphylactoid reaction(s)); adrenal axis suppression; blood sugar elevation that in diabetics may result in ketoacidosis or comma; water retention that in patients with history of congestive heart failure may result in shortness of breath, pulmonary edema, and decompensation with resultant heart failure; weight gain; swelling or edema; medication-induced neural toxicity; particulate matter embolism and blood vessel occlusion with resultant organ, and/or nervous system infarction; and/or aseptic necrosis of one or more joints. Finally, the patient was informed that Medicine is not an exact science; therefore, there is also the possibility of unforeseen or unpredictable risks and/or possible complications that may result in a catastrophic outcome. The patient indicated having understood very clearly. We have given the patient no guarantees and we have made no promises. Enough time was given to the patient to ask questions, all of which were answered to the patient's satisfaction. Ms. Weatherholtz has indicated that she wanted to continue with the procedure. Attestation: I, the ordering provider, attest that I have discussed with the patient the benefits, risks, side-effects, alternatives, likelihood of achieving goals, and potential problems during recovery for the procedure that I have provided informed consent. Date  Time: 03/13/2021  9:39 AM  Pre-Procedure Preparation:  Monitoring: As per clinic protocol. Respiration, ETCO2, SpO2, BP, heart rate and rhythm monitor placed and checked for adequate function Safety Precautions: Patient was assessed for positional  comfort and pressure points before starting the procedure. Time-out: I initiated and conducted the "Time-out" before starting the procedure, as per protocol. The patient was asked to participate by confirming the accuracy of the "Time Out" information. Verification of the correct person, site, and procedure were performed and confirmed by me, the nursing staff, and the patient. "Time-out" conducted as per Joint Commission's Universal Protocol (UP.01.01.01). Time: 1026  Description of Procedure:          Area Prepped: Entire shoulder Area DuraPrep (Iodine Povacrylex [0.7% available iodine] and Isopropyl Alcohol, 74% w/w) Safety Precautions: Aspiration looking for blood return was conducted prior to all injections. At no point did we inject any substances, as a needle was being advanced. No attempts were made at seeking any paresthesias. Safe injection practices and needle disposal techniques used. Medications properly checked for expiration dates. SDV (single dose vial) medications used. Description of the Procedure: Protocol guidelines were followed. The patient was placed in position over the procedure table. The target area was identified and the area prepped in the usual manner. Skin & deeper tissues infiltrated with local anesthetic. Appropriate amount of time allowed to pass for local anesthetics to take effect. The procedure needles were then advanced to the target area. Proper needle placement secured. Negative aspiration confirmed. Solution injected in intermittent fashion, asking for systemic symptoms every 0.5cc of injectate. The needles were then removed and the area cleansed, making sure to leave some of the prepping solution back to take advantage of its long term bactericidal properties.         Vitals:   03/13/21 0941 03/13/21 1028 03/13/21 1031  BP: (!) 145/99 (!) 129/97 128/88  Pulse: 91    Resp: 16 16 19   Temp: (!) 96.4 F (35.8 C)    SpO2: 98% 96% 96%  Weight: 210 lb (95.3 kg)     Height: 5\' 7"  (1.702 m)      Start Time: 1026 hrs. End Time: 1029 hrs. Materials:  Needle(s) Type: Spinal Needle Gauge: 22G Length: 3.5-in Medication(s): Please see orders for medications and dosing  details. 5 cc solution made of 4 cc of 0.2% ropivacaine, 1 cc of Decadron, 10 mg/cc.   Imaging Guidance (Non-Spinal):          Type of Imaging Technique: Fluoroscopy Guidance (Non-Spinal) Indication(s): Assistance in needle guidance and placement for procedures requiring needle placement in or near specific anatomical locations not easily accessible without such assistance. Exposure Time: Please see nurses notes. Contrast: Before injecting any contrast, we confirmed that the patient did not have an allergy to iodine, shellfish, or radiological contrast. Once satisfactory needle placement was completed at the desired level, radiological contrast was injected. Contrast injected under live fluoroscopy. No contrast complications. See chart for type and volume of contrast used. Fluoroscopic Guidance: I was personally present during the use of fluoroscopy. "Tunnel Vision Technique" used to obtain the best possible view of the target area. Parallax error corrected before commencing the procedure. "Direction-depth-direction" technique used to introduce the needle under continuous pulsed fluoroscopy. Once target was reached, antero-posterior, oblique, and lateral fluoroscopic projection used confirm needle placement in all planes. Images permanently stored in EMR. Interpretation: I personally interpreted the imaging intraoperatively. Adequate needle placement confirmed in multiple planes. Appropriate spread of contrast into desired area was observed. No evidence of afferent or efferent intravascular uptake. Permanent images saved into the patient's record.  Post-operative Assessment:  Post-procedure Vital Signs:  Pulse/HCG Rate: 9175 Temp: (!) 96.4 F (35.8 C) Resp: 19 BP: 128/88 SpO2: 96 %  EBL:  None  Complications: No immediate post-treatment complications observed by team, or reported by patient.  Note: The patient tolerated the entire procedure well. A repeat set of vitals were taken after the procedure and the patient was kept under observation following institutional policy, for this type of procedure. Post-procedural neurological assessment was performed, showing return to baseline, prior to discharge. The patient was provided with post-procedure discharge instructions, including a section on how to identify potential problems. Should any problems arise concerning this procedure, the patient was given instructions to immediately contact us, at any time, without hesitation. In any case, we plan to contact the patient by telephone for a follow-up status report regarding this interventional procedure.  Comments:  No additional relevant information.  Plan of Care  Orders:  Orders Placed This Encounter  Procedures  . [facility] C-ARM 1-60 MIN NO REPORT    Intraoperative interpretation by procedural physician at [facility].    Standing Status:   Standing    Number of Occurrences:   1    Order Specific Question:   Reason for exam:    Answer:   Assistance in needle guidance and placement for procedures requiring needle placement in or near specific anatomical locations not easily accessible without such assistance.    Medications ordered for procedure: Meds ordered this encounter  Medications  . lidocaine (XYLOCAINE) 2 % (with pres) injection 400 mg  . iohexol (OMNIPAQUE) 180 MG/ML injection 10 mL    Must be Myelogram-compatible. If not available, you may substitute with a water-soluble, non-ionic, hypoallergenic, myelogram-compatible radiological contrast medium.  . ropivacaine (PF) 2 mg/mL (0.2%) (NAROPIN) injection 4 mL  . dexamethasone (DECADRON) injection 10 mg  . HYDROcodone-acetaminophen (NORCO/VICODIN) 5-325 MG tablet    Sig: Take 1 tablet by mouth every 6  (six) hours as needed for severe pain. Must last 30 days.    Dispense:  120 tablet    Refill:  0    Chronic Pain. (STOP Act - Not applicable). Fill one day early if closed on scheduled  refill date.  . pregabalin (LYRICA) 50 MG capsule    Sig: 50 mg qAM, 100 mg qhs    Dispense:  90 capsule    Refill:  1    Fill one day early if pharmacy is closed on scheduled refill date. May substitute for generic if available.   Medications administered: We administered lidocaine, iohexol, ropivacaine (PF) 2 mg/mL (0.2%), and dexamethasone.  See the medical record for exact dosing, route, and time of administration.  Follow-up plan:   Return in about 4 weeks (around 04/10/2021) for Post Procedure Evaluation, Medication Management.      s/p IA hyalgan #1 on 08/19/2019, #2 09/23/2019 not very effective unfortunately.  Lumbar MRI shows facet disease at L3, L4, L5 bilaterally.  Bilateral L3, L4, L5 medial branch nerve block #1; right glenohumeral shoulder joint injection (anterior approach) 10/19/2020, 03/13/2021         Recent Visits Date Type Provider Dept  01/19/21 Office Visit Gillis Santa, MD [facility]  Showing recent visits within past 90 days and meeting all other requirements Today's Visits Date Type Provider Dept  03/13/21 Procedure visit Gillis Santa, MD [facility]  Showing today's visits and meeting all other requirements Future Appointments Date Type Provider Dept  04/10/21 Appointment Gillis Santa, MD [facility]  Showing future appointments within next 90 days and meeting all other requirements  Disposition: Discharge home  Discharge (Date  Time): 03/13/2021; 1040 hrs.   Primary Care Physician: Marval Regal, NP Location: [facility] Note by: Gillis Santa, MD Date: 03/13/2021; Time: 11:57 AM  Disclaimer:  Medicine is not an exact science. The only guarantee in medicine is that nothing is guaranteed. It is important to note  that the decision to proceed with this intervention was based on the information collected from the patient. The Data and conclusions were drawn from the patient's questionnaire, the interview, and the physical examination. Because the information was provided in large part by the patient, it cannot be guaranteed that it has not been purposely or unconsciously manipulated. Every effort has been made to obtain as much relevant data as possible for this evaluation. It is important to note that the conclusions that lead to this procedure are derived in large part from the available data. Always take into account that the treatment will also be dependent on availability of resources and existing treatment guidelines, considered by other Pain Management Practitioners as being common knowledge and practice, at the time of the intervention. For Medico-Legal purposes, it is also important to point out that variation in procedural techniques and pharmacological choices are the acceptable norm. The indications, contraindications, technique, and results of the above procedure should only be interpreted and judged by a Board-Certified Interventional Pain Specialist with extensive familiarity and expertise in the same exact procedure and technique.

## 2021-03-14 ENCOUNTER — Telehealth: Payer: Self-pay

## 2021-03-14 ENCOUNTER — Encounter: Payer: 59 | Admitting: Student in an Organized Health Care Education/Training Program

## 2021-03-14 NOTE — Telephone Encounter (Signed)
Post procedure phone call.  LM 

## 2021-03-19 ENCOUNTER — Encounter: Payer: Self-pay | Admitting: Student in an Organized Health Care Education/Training Program

## 2021-03-20 ENCOUNTER — Telehealth: Payer: Self-pay

## 2021-03-20 NOTE — Telephone Encounter (Signed)
Pt states the LYRICA, is giving her side effects of swimming headiness, Nausea, Lethargic.Marland Kitchen Please follow up

## 2021-03-20 NOTE — Telephone Encounter (Signed)
Dr. Holley Raring, I am experiencing bad symptoms from Lyrica over the weekend. I am extremely tired and dizzy. My legs feel swollen but they don't appear swollen. My heart feels like it's pounding out of my chest, and I'm nauseous  as well.    What should I do next? Go off of it?    Please let me know. Thank you!   Dr Holley Raring, this was the message that the patient sent in on my chart last evening.

## 2021-03-20 NOTE — Telephone Encounter (Signed)
Please have her discontinue and see if she would like to restart Cymbalta at 30 mg

## 2021-03-20 NOTE — Telephone Encounter (Signed)
Patient states that she started taking the Lyrica last week and did fine all week.  Then on yesterday, Sunday, the medication started causing her adverse reactions such as swimming headed, nauseated, lethargic, bounding heart rate also having trouble lifting herself up.  She feels that it is the medication.  States she feels like it is helping the nerve pain. She reports that she cut her dose in half last evening to 50 mg and then did not take this mornings dose.  Patient reports that she is still nauseated.  Questioning if she should go back to Cymbalta

## 2021-03-27 ENCOUNTER — Ambulatory Visit: Payer: 59 | Admitting: Family Medicine

## 2021-04-06 ENCOUNTER — Encounter: Payer: Self-pay | Admitting: Student in an Organized Health Care Education/Training Program

## 2021-04-10 ENCOUNTER — Encounter: Payer: Self-pay | Admitting: Student in an Organized Health Care Education/Training Program

## 2021-04-10 ENCOUNTER — Ambulatory Visit
Payer: 59 | Attending: Student in an Organized Health Care Education/Training Program | Admitting: Student in an Organized Health Care Education/Training Program

## 2021-04-10 ENCOUNTER — Other Ambulatory Visit: Payer: Self-pay

## 2021-04-10 DIAGNOSIS — G8929 Other chronic pain: Secondary | ICD-10-CM

## 2021-04-10 DIAGNOSIS — M75101 Unspecified rotator cuff tear or rupture of right shoulder, not specified as traumatic: Secondary | ICD-10-CM

## 2021-04-10 DIAGNOSIS — M25511 Pain in right shoulder: Secondary | ICD-10-CM | POA: Diagnosis not present

## 2021-04-10 DIAGNOSIS — M12811 Other specific arthropathies, not elsewhere classified, right shoulder: Secondary | ICD-10-CM

## 2021-04-10 DIAGNOSIS — G894 Chronic pain syndrome: Secondary | ICD-10-CM

## 2021-04-10 DIAGNOSIS — M19011 Primary osteoarthritis, right shoulder: Secondary | ICD-10-CM

## 2021-04-10 NOTE — Progress Notes (Signed)
Patient: [name]  Service Category: E/M  Provider: Gillis Santa, MD  DOB: 07-26-1961  DOS: 04/10/2021  Location: Office  MRN: [id number]  Setting: Ambulatory outpatient  Referring Provider: Marval Regal, NP  Type: Established Patient  Specialty: Interventional Pain Management  PCP: Marval Regal, NP  Location: Office  Delivery: TeleHealth     Virtual Encounter - Pain Management PROVIDER NOTE: Information contained herein reflects review and annotations entered in association with encounter. Interpretation of such information and data should be left to medically-trained personnel. Information provided to patient can be located elsewhere in the medical record under "Patient Instructions". Document created using STT-dictation technology, any transcriptional errors that may result from process are unintentional.    Contact & Pharmacy Preferred: [phone] Home: [phone] (home) Mobile: [phone] (mobile) E-mail: [email or web address]  [id number] [address] [place]Phone: [phone]: [phone]  Pre-screening  [name] offered "in-person" vs "virtual" encounter. She indicated preferring virtual for this encounter.   Reason COVID-19*  Social distancing based on CDC and AMA recommendations.   I contacted Vanessa Snareon 04/10/2021 via video conference.      I clearly identified myself as BGillis Santa MD. I verified that I was speaking with the correct person using two identifiers (Name: [name] and date of birth: 802/01/1961.  Consent I sought verbal advanced consent from Vanessa Snarefor virtual visit interactions. I informed [name] of possible security and privacy concerns, risks, and limitations associated with providing "not-in-person" medical evaluation and management services. I also informed [name] of the availability of "in-person" appointments. Finally, I  informed her that there would be a charge for the virtual visit and that she could be  personally, fully or partially, financially responsible for it. [name] understanding and agreed to proceed.   Historic Elements   Ms. SDESIRIE MINTEERis a 60y.o. year old, female patient evaluated today after our last contact on 03/13/2021. [name] has a past medical history of Arthritis, Degenerative disorder of bone, Genuine stress incontinence, female (2012), GERD (gastroesophageal reflux disease), Migraines, and Obesity. She also  has a past surgical history that includes Laparoscopic hysterectomy (10/23/2010); Midurethral sling (05/02/2011); and excision of vulvar lesion (05/02/2011). Ms. SLindellhas a current medication list which includes the following prescription(Huynh): hydrocodone-acetaminophen, losartan-hydrochlorothiazide, melatonin, multivitamin, omeprazole, valacyclovir, and pregabalin. She  reports that she quit smoking about 18 years ago. Her smoking use included cigarettes. She has never used smokeless tobacco. She reports previous alcohol use. She reports that she does not use drugs. [name] No Known Allergies.   HPI  Today, she is being contacted for both, medication management and a post-procedure assessment.   In regards to Lyrica that was started at last visit (given that Cymbalta was discontinued secondary to side effect of hypertension), patient states that she was dizzy, fatigued and had feeling of leg swelling as well as palpitations.  I had her discontinue Lyrica based upon patient message on 03-19-2021.  I recommend that she restart Cymbalta however the patient wanted to think about this further.  When talking with her today, patient states that her side effects may be secondary to a viral GI bug that she had.  She states that she is over that now.  She would like to retrial Lyrica which I have instructed her to start at 50 mg nightly for 5 days then increase to 50 mg twice  daily for 5 days then increase to 100 mg nightly thereafter.  Patient will follow-up in 4 weeks for medication management and we can discuss further if she should remain on Lyrica or transition back to Cymbalta albeit at a lower dose to avoid hypotension.  Post-Procedure Evaluation  Procedure (03/13/2021):  Type: Therapeutic Glenohumeral Joint (shoulder) Injection #2  Primary Purpose: Diagnostic Region: Anterior Shoulder Area Level:  Shoulder Target Area: Glenohumeral Joint (shoulder) Approach: Anterolateral approach. Laterality: Right-Sided  Sedation: Please see nurses note.  Effectiveness during initial hour after procedure(Ultra-Short Term Relief): 100 %  Local anesthetic used: Long-acting (4-6 hours) Effectiveness: Defined as any analgesic benefit obtained secondary to the administration of local anesthetics. This carries significant diagnostic value as to the etiological location, or anatomical origin, of the pain. Duration of benefit is expected to coincide with the duration of the local anesthetic used.  Effectiveness during initial 4-6 hours after procedure(Short-Term Relief): 100 %   Long-term benefit: Defined as any relief past the pharmacologic duration of the local anesthetics.  Effectiveness past the initial 6 hours after procedure(Long-Term Relief): 55 % (lasting a week)   Current benefits: Defined as benefit that persist at this time.   Analgesia:  <50% better   Pharmacotherapy Assessment  Analgesic: hydrocodone 5 mg twice daily as needed, quantity 60/month    Monitoring:  PMP: PDMP reviewed during this encounter.       Pharmacotherapy: No side-effects or adverse reactions reported. Compliance: No problems identified. Effectiveness: Clinically acceptable. Plan: Refer to "POC".  UDS:  Summary  Date Value Ref Range Status  05/18/2020 Note  Final    Comment:    ==================================================================== [employer] Select 13  (MW) ==================================================================== Test                             Result       Flag       Units Drug Present and Declared for Prescription Verification   Hydrocodone                    223          EXPECTED   ng/mg creat   Norhydrocodone                 487          EXPECTED   ng/mg creat    Sources of hydrocodone include scheduled prescription medications.    Norhydrocodone is an expected metabolite of hydrocodone. ==================================================================== Test                      Result    Flag   Units      Ref Range   Creatinine              31               mg/dL      >=20 ==================================================================== Declared Medications:  The flagging and interpretation on this report are based on the  following declared medications.  Unexpected results may arise from  inaccuracies in the declared medications.  **Note: The testing scope of this panel includes these medications:  Hydrocodone (Norco)  **Note: The testing scope of this panel does not include the  following reported medications:  Acetaminophen (Norco)  Azithromycin (Zithromax)  Duloxetine (Cymbalta)  Gabapentin (Neurontin)  Melatonin  Naproxen (Aleve)  Omeprazole (Prilosec)  Ondansetron (Zofran)  Topical  Turmeric  Valacyclovir (Valtrex) ==================================================================== For clinical  consultation, please call [phone]. ====================================================================     Laboratory Chemistry Profile   Renal Lab Results  Component Value Date   BUN 10 02/24/2021   CREATININE 0.83 02/24/2021   BCR 12 06/16/2018   GFR 74.95 02/09/2021   GFRAA 92 06/16/2018   GFRNONAA >60 02/24/2021     Hepatic Lab Results  Component Value Date   AST 18 02/01/2021   ALT 18 02/01/2021   ALBUMIN 4.1 02/01/2021   ALKPHOS 81 02/01/2021     Electrolytes Lab Results   Component Value Date   NA 138 02/24/2021   K 3.9 02/24/2021   CL 107 02/24/2021   CALCIUM 9.8 02/24/2021   MG 1.8 01/30/2019     Bone Lab Results  Component Value Date   VD25OH 41.01 10/28/2020     Inflammation (CRP: Acute Phase) (ESR: Chronic Phase) No results found for: CRP, ESRSEDRATE, LATICACIDVEN     Note: Above Lab results reviewed.   Assessment  The primary encounter diagnosis was Chronic right shoulder pain. Diagnoses of Primary osteoarthritis of right shoulder, Right rotator cuff tear arthropathy, and Chronic pain syndrome were also pertinent to this visit.  Plan of Care    Continue Lyrica as prescribed.  Follow-up in 4 weeks for medication management.  We will further discuss at that time whether to titrate Lyrica or transition to Cymbalta at a low dose.  Follow-up plan:   No follow-ups on file.     Huynh/p IA hyalgan #1 on 08/19/2019, #2 09/23/2019 not very effective unfortunately.  Lumbar MRI shows facet disease at L3, L4, L5 bilaterally.  Bilateral L3, L4, L5 medial branch nerve block #1; right glenohumeral shoulder joint injection (anterior approach) 10/19/2020, 03/13/2021          Recent Visits Date Type Provider Dept  03/13/21 Procedure visit Gillis Santa, MD [facility]  01/19/21 Office Visit Gillis Santa, MD [facility]  Showing recent visits within past 90 days and meeting all other requirements Today'Huynh Visits Date Type Provider Dept  04/10/21 Telemedicine Gillis Santa, MD [facility]  Showing today'Huynh visits and meeting all other requirements Future Appointments Date Type Provider Dept  05/10/21 Appointment Gillis Santa, MD [facility]  Showing future appointments within next 90 days and meeting all other requirements  I discussed the assessment and treatment plan with the patient. The patient was provided an opportunity to ask questions and all were answered. The patient agreed with the plan and demonstrated an  understanding of the instructions.  Patient advised to call back or seek an in-person evaluation if the symptoms or condition worsens.  Duration of encounter: 15 minutes.  Note by: Gillis Santa, MD Date: 04/10/2021; Time: 3:37 PM

## 2021-04-17 ENCOUNTER — Encounter: Payer: Self-pay | Admitting: *Deleted

## 2021-04-26 ENCOUNTER — Telehealth: Payer: Self-pay | Admitting: Student in an Organized Health Care Education/Training Program

## 2021-04-26 DIAGNOSIS — M47816 Spondylosis without myelopathy or radiculopathy, lumbar region: Secondary | ICD-10-CM

## 2021-04-26 DIAGNOSIS — G894 Chronic pain syndrome: Secondary | ICD-10-CM

## 2021-04-26 MED ORDER — DULOXETINE HCL 20 MG PO CPEP: ORAL_CAPSULE | ORAL | 0 refills | Status: DC

## 2021-04-26 NOTE — Telephone Encounter (Signed)
Called and talked with patient. She states that in the past,she was taking Cymbalta 60mg  and it caused her to have high blood pressure.  She was then changed to Lyrica.  Lyrica does not help her like Cymbalta did and is wondering if she could switch back to a lower dose of Cymbalta. Patient has an appt 05/10/21 for MM. I tried to look at the sch. for an earlier time but could not find one.  She is also hurting all over( joint pain). Her back shoulder, knee, hand and is requesting a Facet Block.  What are your thoughts and I will be glad to call the patient back.

## 2021-04-26 NOTE — Telephone Encounter (Signed)
Yes that is fine.  Have her start Cymbalta 20 mg for 2 weeks.  Encouraged her to monitor her blood pressure.  If blood pressures are stable she can increase to 40 mg after that.

## 2021-04-27 NOTE — Telephone Encounter (Signed)
Called patient and notified her of Dr Elmon Else response via answering machine. Instructed to call back if needed.

## 2021-04-28 ENCOUNTER — Other Ambulatory Visit: Payer: 59

## 2021-05-08 ENCOUNTER — Telehealth: Payer: Self-pay | Admitting: Student in an Organized Health Care Education/Training Program

## 2021-05-08 ENCOUNTER — Encounter: Payer: Self-pay | Admitting: Student in an Organized Health Care Education/Training Program

## 2021-05-08 NOTE — Telephone Encounter (Signed)
Message has been sent to Dr Lateef.  

## 2021-05-09 NOTE — Telephone Encounter (Signed)
Error. ng 

## 2021-05-10 ENCOUNTER — Ambulatory Visit
Payer: 59 | Attending: Student in an Organized Health Care Education/Training Program | Admitting: Student in an Organized Health Care Education/Training Program

## 2021-05-10 ENCOUNTER — Encounter: Payer: 59 | Admitting: Student in an Organized Health Care Education/Training Program

## 2021-05-10 ENCOUNTER — Telehealth: Payer: Self-pay

## 2021-05-10 ENCOUNTER — Other Ambulatory Visit: Payer: Self-pay

## 2021-05-10 ENCOUNTER — Encounter: Payer: Self-pay | Admitting: Student in an Organized Health Care Education/Training Program

## 2021-05-10 DIAGNOSIS — M170 Bilateral primary osteoarthritis of knee: Secondary | ICD-10-CM

## 2021-05-10 DIAGNOSIS — G894 Chronic pain syndrome: Secondary | ICD-10-CM

## 2021-05-10 DIAGNOSIS — M12811 Other specific arthropathies, not elsewhere classified, right shoulder: Secondary | ICD-10-CM

## 2021-05-10 DIAGNOSIS — M47816 Spondylosis without myelopathy or radiculopathy, lumbar region: Secondary | ICD-10-CM

## 2021-05-10 DIAGNOSIS — M25511 Pain in right shoulder: Secondary | ICD-10-CM

## 2021-05-10 DIAGNOSIS — M75101 Unspecified rotator cuff tear or rupture of right shoulder, not specified as traumatic: Secondary | ICD-10-CM

## 2021-05-10 DIAGNOSIS — G8929 Other chronic pain: Secondary | ICD-10-CM

## 2021-05-10 DIAGNOSIS — M5416 Radiculopathy, lumbar region: Secondary | ICD-10-CM

## 2021-05-10 DIAGNOSIS — M19011 Primary osteoarthritis, right shoulder: Secondary | ICD-10-CM | POA: Diagnosis not present

## 2021-05-10 MED ORDER — HYDROCODONE-ACETAMINOPHEN 5-325 MG PO TABS: 1.0000 | ORAL_TABLET | Freq: Four times a day (QID) | ORAL | 0 refills | Status: AC | PRN

## 2021-05-10 MED ORDER — GABAPENTIN 600 MG PO TABS
ORAL_TABLET | ORAL | 2 refills | Status: DC
Start: 2021-05-10 — End: 2021-07-27

## 2021-05-10 MED ORDER — DULOXETINE HCL 20 MG PO CPEP: 20.0000 mg | ORAL_CAPSULE | Freq: Every day | ORAL | 0 refills | Status: DC

## 2021-05-10 NOTE — Telephone Encounter (Signed)
LM for patient to call office for pre virtual visit appointment.

## 2021-05-10 NOTE — Progress Notes (Signed)
Patient: [name]  Service Category: E/M  Provider: Gillis Santa, MD  DOB: 1961/10/26  DOS: 05/10/2021  Location: Office  MRN: [id number]  Setting: Ambulatory outpatient  Referring Provider: Marval Regal, NP  Type: Established Patient  Specialty: Interventional Pain Management  PCP: Marval Regal, NP  Location: Home  Delivery: TeleHealth     Virtual Encounter - Pain Management PROVIDER NOTE: Information contained herein reflects review and annotations entered in association with encounter. Interpretation of such information and data should be left to medically-trained personnel. Information provided to patient can be located elsewhere in the medical record under "Patient Instructions". Document created using STT-dictation technology, any transcriptional errors that may result from process are unintentional.    Contact & Pharmacy Preferred: [phone] Home: [phone] (home) Mobile: [phone] (mobile) E-mail: [email or web address]  [id number] [facility]Phone: [phone]: [phone]  Pre-screening  [name] offered "in-person" vs "virtual" encounter. She indicated preferring virtual for this encounter.   Reason COVID-19*  Social distancing based on CDC and AMA recommendations.   I contacted [name] 05/10/2021 via video conference.      I clearly identified myself as BGillis Santa MD. I verified that I was speaking with the correct person using two identifiers (Name: SLASHUNA TAMASHIRO and date of birth: 813-Jan-1962.  Consent I sought verbal advanced consent from Vanessa Snarefor virtual visit interactions. I informed Ms. Arakelian of possible security and privacy concerns, risks, and limitations associated with providing "not-in-person" medical evaluation and management services. I also informed Ms. Colwell of the availability of "in-person" appointments. Finally, I informed  her that there would be a charge for the virtual visit and that she could be  personally, fully or partially, financially responsible for it. Ms. SKeenerexpressed understanding and agreed to proceed.   Historic Elements   Ms. SNADYA HOPWOODis a 60y.o. year old, female patient evaluated today after our last contact on 05/08/2021. [name] has a past medical history of Arthritis, Degenerative disorder of bone, Genuine stress incontinence, female (2012), GERD (gastroesophageal reflux disease), Migraines, and Obesity. She also  has a past surgical history that includes Laparoscopic hysterectomy (10/23/2010); Midurethral sling (05/02/2011); and excision of vulvar lesion (05/02/2011). Ms. SSadikhas a current medication list which includes the following prescription(s): gabapentin, [START ON 05/29/2021] hydrocodone-acetaminophen, duloxetine, losartan-hydrochlorothiazide, melatonin, multivitamin, omeprazole, and valacyclovir. She  reports that she quit smoking about 18 years ago. Her smoking use included cigarettes. She has never used smokeless tobacco. She reports previous alcohol use. She reports that she does not use drugs. Ms. SRaphaelhas No Known Allergies.   HPI  Today, she is being contacted for medication management.   [name]'s grandson was positive for Covid-19 and as a result everyone in her family also got infected and sick as well including Ronell Dealing with headaches, fever, and myalgias. Is taking Excedrin and Ibuprofen. For this reason, patient could not come in face to face. Otherwise she continues her hydrocodone as prescribed, takes 1 to 2 tablets daily.  Is finding benefit with Cymbalta at 20 mg.  We will not escalate dose given increases in blood pressure that she was noticing a 40 mg. Recommend restarting gabapentin 300 mg during the day and 600 mg nightly.  Pharmacotherapy Assessment  Analgesic: hydrocodone 5 mg twice daily as needed, quantity 60/month    Monitoring: [place] PMP:  PDMP reviewed during this encounter.  Pharmacotherapy: No side-effects or adverse reactions reported. Compliance: No problems identified. Effectiveness: Clinically acceptable. Plan: Refer to "POC".  UDS:  Summary  Date Value Ref Range Status  05/18/2020 Note  Final    Comment:    ==================================================================== [employer] Select 13 (MW) ==================================================================== Test                             Result       Flag       Units Drug Present and Declared for Prescription Verification   Hydrocodone                    223          EXPECTED   ng/mg creat   Norhydrocodone                 487          EXPECTED   ng/mg creat    Sources of hydrocodone include scheduled prescription medications.    Norhydrocodone is an expected metabolite of hydrocodone. ==================================================================== Test                      Result    Flag   Units      Ref Range   Creatinine              31               mg/dL      >=20 ==================================================================== Declared Medications:  The flagging and interpretation on this report are based on the  following declared medications.  Unexpected results may arise from  inaccuracies in the declared medications.  **Note: The testing scope of this panel includes these medications:  Hydrocodone (Norco)  **Note: The testing scope of this panel does not include the  following reported medications:  Acetaminophen (Norco)  Azithromycin (Zithromax)  Duloxetine (Cymbalta)  Gabapentin (Neurontin)  Melatonin  Naproxen (Aleve)  Omeprazole (Prilosec)  Ondansetron (Zofran)  Topical  Turmeric  Valacyclovir (Valtrex) ==================================================================== For clinical consultation, please call [phone]. ====================================================================     Laboratory  Chemistry Profile   Renal Lab Results  Component Value Date   BUN 10 02/24/2021   CREATININE 0.83 02/24/2021   BCR 12 06/16/2018   GFR 74.95 02/09/2021   GFRAA 92 06/16/2018   GFRNONAA >60 02/24/2021     Hepatic Lab Results  Component Value Date   AST 18 02/01/2021   ALT 18 02/01/2021   ALBUMIN 4.1 02/01/2021   ALKPHOS 81 02/01/2021     Electrolytes Lab Results  Component Value Date   NA 138 02/24/2021   K 3.9 02/24/2021   CL 107 02/24/2021   CALCIUM 9.8 02/24/2021   MG 1.8 01/30/2019     Bone Lab Results  Component Value Date   VD25OH 41.01 10/28/2020     Inflammation (CRP: Acute Phase) (ESR: Chronic Phase) No results found for: CRP, ESRSEDRATE, LATICACIDVEN     Note: Above Lab results reviewed.  Assessment  The primary encounter diagnosis was Chronic pain syndrome. Diagnoses of Lumbar spondylosis, Chronic right shoulder pain, Primary osteoarthritis of right shoulder, Right rotator cuff tear arthropathy, Lumbar facet arthropathy, Bilateral primary osteoarthritis of knee, and Lumbar facetogenic pain were also pertinent to this visit.  Plan of Care   [name] has a current medication list which includes the following long-term medication(s): gabapentin, duloxetine, losartan-hydrochlorothiazide, and omeprazole.  Pharmacotherapy (Medications Ordered): Meds ordered  this encounter  Medications  . HYDROcodone-acetaminophen (NORCO/VICODIN) 5-325 MG tablet    Sig: Take 1 tablet by mouth every 6 (six) hours as needed for severe pain. Must last 30 days.    Dispense:  120 tablet    Refill:  0    Chronic Pain. (STOP Act - Not applicable). Fill one day early if closed on scheduled refill date.  . DULoxetine (CYMBALTA) 20 MG capsule    Sig: Take 1 capsule (20 mg total) by mouth daily.    Dispense:  90 capsule    Refill:  0  . gabapentin (NEURONTIN) 600 MG tablet    Sig: 300 mg qday, 600 mg qhs    Dispense:  30 tablet    Refill:  2    Follow-up plan:    Return in about 3 months (around 07/27/2021) for Medication Management, in person.     s/p IA hyalgan #1 on 08/19/2019, #2 09/23/2019 not very effective unfortunately.  Lumbar MRI shows facet disease at L3, L4, L5 bilaterally.  Bilateral L3, L4, L5 medial branch nerve block #1; right glenohumeral shoulder joint injection (anterior approach) 10/19/2020, 03/13/2021           Recent Visits Date Type Provider Dept  04/10/21 Telemedicine Gillis Santa, MD [facility]  03/13/21 Procedure visit Gillis Santa, MD [facility]  Showing recent visits within past 90 days and meeting all other requirements Today's Visits Date Type Provider Dept  05/10/21 Telemedicine Gillis Santa, MD [facility]  Showing today's visits and meeting all other requirements Future Appointments Date Type Provider Dept  06/06/21 Appointment Gillis Santa, MD [facility]  Showing future appointments within next 90 days and meeting all other requirements  I discussed the assessment and treatment plan with the patient. The patient was provided an opportunity to ask questions and all were answered. The patient agreed with the plan and demonstrated an understanding of the instructions.  Patient advised to call back or seek an in-person evaluation if the symptoms or condition worsens.  Duration of encounter: 30 minutes.  Note by: Gillis Santa, MD Date: 05/10/2021; Time: 1:07 PM

## 2021-06-06 ENCOUNTER — Encounter: Payer: 59 | Admitting: Student in an Organized Health Care Education/Training Program

## 2021-07-27 ENCOUNTER — Telehealth: Payer: Self-pay

## 2021-07-27 ENCOUNTER — Ambulatory Visit
Payer: 59 | Attending: Student in an Organized Health Care Education/Training Program | Admitting: Student in an Organized Health Care Education/Training Program

## 2021-07-27 ENCOUNTER — Encounter: Payer: Self-pay | Admitting: Student in an Organized Health Care Education/Training Program

## 2021-07-27 ENCOUNTER — Other Ambulatory Visit: Payer: Self-pay

## 2021-07-27 VITALS — BP 128/93 | HR 79 | Temp 97.0°F | Resp 18 | Ht 68.0 in | Wt 200.0 lb

## 2021-07-27 DIAGNOSIS — G894 Chronic pain syndrome: Secondary | ICD-10-CM | POA: Diagnosis present

## 2021-07-27 DIAGNOSIS — M75101 Unspecified rotator cuff tear or rupture of right shoulder, not specified as traumatic: Secondary | ICD-10-CM | POA: Diagnosis present

## 2021-07-27 DIAGNOSIS — M19011 Primary osteoarthritis, right shoulder: Secondary | ICD-10-CM | POA: Diagnosis present

## 2021-07-27 DIAGNOSIS — M533 Sacrococcygeal disorders, not elsewhere classified: Secondary | ICD-10-CM | POA: Insufficient documentation

## 2021-07-27 DIAGNOSIS — M25511 Pain in right shoulder: Secondary | ICD-10-CM | POA: Diagnosis present

## 2021-07-27 DIAGNOSIS — G8929 Other chronic pain: Secondary | ICD-10-CM | POA: Diagnosis present

## 2021-07-27 DIAGNOSIS — M170 Bilateral primary osteoarthritis of knee: Secondary | ICD-10-CM | POA: Diagnosis present

## 2021-07-27 DIAGNOSIS — M797 Fibromyalgia: Secondary | ICD-10-CM | POA: Diagnosis present

## 2021-07-27 DIAGNOSIS — M47816 Spondylosis without myelopathy or radiculopathy, lumbar region: Secondary | ICD-10-CM | POA: Diagnosis present

## 2021-07-27 DIAGNOSIS — M12811 Other specific arthropathies, not elsewhere classified, right shoulder: Secondary | ICD-10-CM | POA: Diagnosis present

## 2021-07-27 MED ORDER — HYDROCODONE-ACETAMINOPHEN 5-325 MG PO TABS: 1.0000 | ORAL_TABLET | Freq: Three times a day (TID) | ORAL | 0 refills | Status: DC | PRN

## 2021-07-27 MED ORDER — DULOXETINE HCL 20 MG PO CPEP: 20.0000 mg | ORAL_CAPSULE | Freq: Every day | ORAL | 0 refills | Status: DC

## 2021-07-27 MED ORDER — GABAPENTIN 600 MG PO TABS: ORAL_TABLET | ORAL | 5 refills | Status: DC

## 2021-07-27 MED ORDER — AMITRIPTYLINE HCL 25 MG PO TABS: ORAL_TABLET | ORAL | 0 refills | Status: DC

## 2021-07-27 NOTE — Progress Notes (Signed)
Nursing Pain Medication Assessment:  Safety precautions to be maintained throughout the outpatient stay will include: orient to surroundings, keep bed in low position, maintain call bell within reach at all times, provide assistance with transfer out of bed and ambulation.  Medication Inspection Compliance: Pill count conducted under aseptic conditions, in front of the patient. Neither the pills nor the bottle was removed from the patient's sight at any time. Once count was completed pills were immediately returned to the patient in their original bottle.  Medication: Hydrocodone/APAP Pill/Patch Count:  4 of 120 pills remain Pill/Patch Appearance: Markings consistent with prescribed medication Bottle Appearance: Standard pharmacy container. Clearly labeled. Filled Date: 06 / 13 / 2022 Last Medication intake:  Today

## 2021-07-27 NOTE — Progress Notes (Signed)
PROVIDER NOTE: Information contained herein reflects review and annotations entered in association with encounter. Interpretation of such information and data should be left to medically-trained personnel. Information provided to patient can be located elsewhere in the medical record under "Patient Instructions". Document created using STT-dictation technology, any transcriptional errors that may result from process are unintentional.    Patient: [name]  Service Category: E/M  Provider: Gillis Santa, MD  DOB: 1961-07-23  DOS: 07/27/2021  Specialty: Interventional Pain Management  MRN: [id number]  Setting: Ambulatory outpatient  PCP: Marval Regal, NP  Type: Established Patient    Referring Provider: Marval Regal, NP  Location: Office  Delivery: Face-to-face     HPI  [name], a 60 y.o. year old female, is here today because of her Fibromyalgia [M79.7]. [name] primary complain today is Back Pain, Shoulder Pain (right), Knee Pain (bilateral), Ankle Pain (bilateral), and Leg Pain (bilateral) Last encounter: My last encounter with her was on 05/10/2021. Pertinent problems: [name] has Obesity; Arthritis; Chronic pain syndrome; Lumbar spondylosis; Bilateral primary osteoarthritis of knee; Bilateral hip pain; Chronic SI joint pain; Lumbar radiculopathy; and Long-term current use of opiate analgesic on their pertinent problem list. Pain Assessment: Severity of Chronic pain is reported as a 8 /10. Location: Back Lower/denies. Onset: More than a month ago. Quality: Constant, Discomfort. Timing: Constant. Modifying factor(s): heat, epson salt bath. Vitals:  height is '5\' 8"'  (1.727 m) and weight is 200 lb (90.7 kg). Her temperature is 97 F (36.1 C) (abnormal). Her blood pressure is 128/93 (abnormal) and her pulse is 79. Her respiration is 18 and oxygen saturation is 99%.   Reason for encounter:   Increased whole-body myofascial pain including right shoulder bilateral knees  bilateral ankles bilateral legs, low back.  We have discussed fibromyalgia in the past and today we will make a formal diagnoses.  We had an extensive discussion today about what fibromyalgia entails, symptoms, diagnoses, management.  We discussed increasing her Cymbalta from 20 mg to 30 mg, increasing hydrocodone, adding amitriptyline.  Risks and benefits of each intervention were discussed in great detail.  Higher doses of Cymbalta did result in hypertension.  Although she did have less complaints of polyarthralgias and myalgias when she was on the higher dose of Cymbalta.  I informed her that adding amitriptyline can be helpful for sleep as well as fibromyalgia.  She would like to try this.  She also states that given increased pain she has been taking 3 hydrocodone's a day.  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  We will make a temporary increase in her hydrocodone to every 8 hours as needed for the next 8 weeks.  Hopefully we can try and optimize nonopioid analgesics in the meantime such as amitriptyline or Cymbalta.  If these are not helpful, I recommend buprenorphine or extended release tramadol in trying to wean off of oxycodone.  Patient endorsed understanding and agreement with plan.  Pharmacotherapy Assessment  Analgesic: hydrocodone 5 mg every 8 hours as needed, quantity 90/month    Monitoring: [place] PMP: PDMP reviewed during this encounter.       Pharmacotherapy: No side-effects or adverse reactions reported. Compliance: No problems identified. Effectiveness: Clinically acceptable.  [name], RN  07/27/2021  8:46 AM  Sign when Signing Visit Nursing Pain Medication Assessment:  Safety precautions to be maintained throughout the outpatient stay will include: orient to surroundings, keep bed in low position, maintain call bell within reach at  all times, provide assistance with transfer out of bed and ambulation.  Medication Inspection Compliance: Pill count  conducted under aseptic conditions, in front of the patient. Neither the pills nor the bottle was removed from the patient's sight at any time. Once count was completed pills were immediately returned to the patient in their original bottle.  Medication: Hydrocodone/APAP Pill/Patch Count:  4 of 120 pills remain Pill/Patch Appearance: Markings consistent with prescribed medication Bottle Appearance: Standard pharmacy container. Clearly labeled. Filled Date: 06 / 13 / 2022 Last Medication intake:  Today      UDS:  Summary  Date Value Ref Range Status  05/18/2020 Note  Final    Comment:    ==================================================================== [employer] Select 13 (MW) ==================================================================== Test                             Result       Flag       Units Drug Present and Declared for Prescription Verification   Hydrocodone                    223          EXPECTED   ng/mg creat   Norhydrocodone                 487          EXPECTED   ng/mg creat    Sources of hydrocodone include scheduled prescription medications.    Norhydrocodone is an expected metabolite of hydrocodone. ==================================================================== Test                      Result    Flag   Units      Ref Range   Creatinine              31               mg/dL      >=20 ==================================================================== Declared Medications:  The flagging and interpretation on this report are based on the  following declared medications.  Unexpected results may arise from  inaccuracies in the declared medications.  **Note: The testing scope of this panel includes these medications:  Hydrocodone (Norco)  **Note: The testing scope of this panel does not include the  following reported medications:  Acetaminophen (Norco)  Azithromycin (Zithromax)  Duloxetine (Cymbalta)  Gabapentin (Neurontin)  Melatonin  Naproxen  (Aleve)  Omeprazole (Prilosec)  Ondansetron (Zofran)  Topical  Turmeric  Valacyclovir (Valtrex) ==================================================================== For clinical consultation, please call [phone]. ====================================================================      ROS  Constitutional: Denies any fever or chills Gastrointestinal: No reported hemesis, hematochezia, vomiting, or acute GI distress Musculoskeletal:  Diffuse arthralgias and myalgias Neurological: No reported episodes of acute onset apraxia, aphasia, dysarthria, agnosia, amnesia, paralysis, loss of coordination, or loss of consciousness  Medication Review  DULoxetine, HYDROcodone-acetaminophen, Melatonin, amitriptyline, gabapentin, multivitamin, omeprazole, and valACYclovir  History Review  Allergy: Ms. Bronk has No Known Allergies. Drug: Ms. Boule  reports no history of drug use. Alcohol:  reports that she does not currently use alcohol. Tobacco:  reports that she quit smoking about 18 years ago. Her smoking use included cigarettes. She has never used smokeless tobacco. Social: Ms. Stuckert  reports that she quit smoking about 18 years ago. Her smoking use included cigarettes. She has never used smokeless tobacco. She reports that she does not currently use alcohol. She reports that she does not use  drugs. Medical:  has a past medical history of Arthritis, Degenerative disorder of bone, Genuine stress incontinence, female (2012), GERD (gastroesophageal reflux disease), Migraines, and Obesity. Surgical: Ms. Razavi  has a past surgical history that includes Laparoscopic hysterectomy (10/23/2010); Midurethral sling (05/02/2011); and excision of vulvar lesion (05/02/2011). Family: family history includes Arthritis in her maternal grandmother and mother; Cancer in her father and paternal grandfather; Diabetes in her son; Heart attack in her maternal grandfather, mother, and paternal grandmother;  Hypertension in her mother.  Laboratory Chemistry Profile   Renal Lab Results  Component Value Date   BUN 10 02/24/2021   CREATININE 0.83 02/24/2021   BCR 12 06/16/2018   GFR 74.95 02/09/2021   GFRAA 92 06/16/2018   GFRNONAA >60 02/24/2021    Hepatic Lab Results  Component Value Date   AST 18 02/01/2021   ALT 18 02/01/2021   ALBUMIN 4.1 02/01/2021   ALKPHOS 81 02/01/2021    Electrolytes Lab Results  Component Value Date   NA 138 02/24/2021   K 3.9 02/24/2021   CL 107 02/24/2021   CALCIUM 9.8 02/24/2021   MG 1.8 01/30/2019    Bone Lab Results  Component Value Date   VD25OH 41.01 10/28/2020    Inflammation (CRP: Acute Phase) (ESR: Chronic Phase) No results found for: CRP, ESRSEDRATE, LATICACIDVEN       Note: Above Lab results reviewed.   Physical Exam  General appearance: Well nourished, well developed, and well hydrated. In no apparent acute distress Mental status: Alert, oriented x 3 (person, place, & time)       Respiratory: No evidence of acute respiratory distress Eyes: PERLA Vitals: BP (!) 128/93   Pulse 79   Temp (!) 97 F (36.1 C)   Resp 18   Ht '5\' 8"'  (1.727 m)   Wt 200 lb (90.7 kg)   SpO2 99%   BMI 30.41 kg/m  BMI: Estimated body mass index is 30.41 kg/m as calculated from the following:   Height as of this encounter: '5\' 8"'  (1.727 m).   Weight as of this encounter: 200 lb (90.7 kg). Ideal: Ideal body weight: 63.9 kg (140 lb 14 oz) Adjusted ideal body weight: 74.6 kg (164 lb 8.4 oz)  Upper Extremity (UE) Exam      Side: Right upper extremity   Side: Left upper extremity  Skin & Extremity Inspection: Skin color, temperature, and hair growth are WNL. No peripheral edema or cyanosis. No masses, redness, swelling, asymmetry, or associated skin lesions. No contractures.   Skin & Extremity Inspection: Skin color, temperature, and hair growth are WNL. No peripheral edema or cyanosis. No masses, redness, swelling, asymmetry, or associated skin lesions.  No contractures.  Functional ROM: Unrestricted range of motion   Functional ROM: Unrestricted ROM          Muscle Tone/Strength: Functionally intact. No obvious neuro-muscular anomalies detected.   Muscle Tone/Strength: Functionally intact. No obvious neuro-muscular anomalies detected.  Sensory (Neurological): Arthropathic arthralgia, significantly improved           Sensory (Neurological): Unimpaired          Palpation: No palpable anomalies               Palpation: No palpable anomalies              Provocative Test(s):  Phalen's test: deferred Tinel's test: deferred Apley's scratch test (touch opposite shoulder):  Action 1 (Across chest): Decreased ROM, improved after treatment Action 2 (Overhead): Decreased ROM, improved after treatment Action  3 (LB reach): Decreased ROM, improved after treatment     Provocative Test(s):  Phalen's test: deferred Tinel's test: deferred Apley's scratch test (touch opposite shoulder):  Action 1 (Across chest): deferred Action 2 (Overhead): deferred Action 3 (LB reach): deferred        Lumbar Spine Area Exam  Skin & Axial Inspection: No masses, redness, or swelling Alignment: Symmetrical Functional ROM: Pain restricted ROM right greater than left       Stability: No instability detected Muscle Tone/Strength: Functionally intact. No obvious neuro-muscular anomalies detected. Sensory (Neurological): Dermatomal pain pattern on the right Palpation: No palpable anomalies       Provocative Tests: Hyperextension/rotation test: (+) bilaterally for facet joint pain.,  Improved after treatment Lumbar quadrant test (Kemp's test): (+) on the right for foraminal stenosis   *(Flexion, ABduction and External Rotation) Gait & Posture Assessment  Ambulation: Unassisted Gait: Relatively normal for age and body habitus Posture: WNL  Lower Extremity Exam      Side: Right lower extremity   Side: Left lower extremity  Stability: No instability observed            Stability: No instability observed          Skin & Extremity Inspection: Skin color, temperature, and hair growth are WNL. No peripheral edema or cyanosis. No masses, redness, swelling, asymmetry, or associated skin lesions. No contractures.   Skin & Extremity Inspection: Skin color, temperature, and hair growth are WNL. No peripheral edema or cyanosis. No masses, redness, swelling, asymmetry, or associated skin lesions. No contractures.  Functional ROM: Pain restricted ROM for hip and knee joints Limited SLR (straight leg raise)   Functional ROM: Pain restricted ROM for hip and knee joints          Muscle Tone/Strength: Functionally intact. No obvious neuro-muscular anomalies detected.   Muscle Tone/Strength: Functionally intact. No obvious neuro-muscular anomalies detected.  Sensory (Neurological): Unimpaired         Sensory (Neurological): Unimpaired        DTR: Patellar: deferred today Achilles: deferred today Plantar: deferred today   DTR: Patellar: deferred today Achilles: deferred today Plantar: deferred today  Palpation: No palpable anomalies   Palpation: No palpable anomalies      Assessment   Status Diagnosis  Controlled Controlled Controlled 1. Fibromyalgia   2. Lumbar spondylosis   3. Chronic right shoulder pain   4. Primary osteoarthritis of right shoulder   5. Right rotator cuff tear arthropathy   6. Lumbar facet arthropathy   7. Bilateral primary osteoarthritis of knee   8. Chronic SI joint pain   9. Chronic pain syndrome       Plan of Care    [name] has a current medication list which includes the following long-term medication(s): amitriptyline, omeprazole, duloxetine, and gabapentin.   Discussed evidence-based therapies for fibromyalgia management. 1.  Start amitriptyline 25 mg nightly for the first 4 weeks then increase to 50 mg nightly 2.  Continue gabapentin as prescribed. 3.  Continue Cymbalta 20 mg daily.  Future considerations could  include dose escalation to 30 mg. 4.  Temporary increase in hydrocodone from every 12 hours to every 8 hours as needed for moderate to severe pain. 5.  Discussed stretching exercises that patient can do at home.  Future considerations: Buprenorphine, extended release tramadol if optimization of nonopioid analgesics is not successful.   Pharmacotherapy (Medications Ordered): Meds ordered this encounter  Medications   amitriptyline (ELAVIL) 25 MG tablet  Sig: Take 1 tablet (25 mg total) by mouth at bedtime for 30 days, THEN 2 tablets (50 mg total) at bedtime.    Dispense:  90 tablet    Refill:  0   gabapentin (NEURONTIN) 600 MG tablet    Sig: 300 mg qday, 600 mg qhs    Dispense:  30 tablet    Refill:  5   DULoxetine (CYMBALTA) 20 MG capsule    Sig: Take 1 capsule (20 mg total) by mouth daily.    Dispense:  90 capsule    Refill:  0   DISCONTD: HYDROcodone-acetaminophen (NORCO/VICODIN) 5-325 MG tablet    Sig: Take 1 tablet by mouth every 8 (eight) hours as needed for moderate pain.    Dispense:  90 tablet    Refill:  0   DISCONTD: HYDROcodone-acetaminophen (NORCO/VICODIN) 5-325 MG tablet    Sig: Take 1 tablet by mouth every 8 (eight) hours as needed for moderate pain.    Dispense:  90 tablet    Refill:  0   HYDROcodone-acetaminophen (NORCO/VICODIN) 5-325 MG tablet    Sig: Take 1 tablet by mouth every 8 (eight) hours as needed for moderate pain.    Dispense:  90 tablet    Refill:  0   HYDROcodone-acetaminophen (NORCO/VICODIN) 5-325 MG tablet    Sig: Take 1 tablet by mouth every 8 (eight) hours as needed for moderate pain.    Dispense:  90 tablet    Refill:  0   Orders:  Orders Placed This Encounter  Procedures   Tox[employer]SU[employer] Select 13 (MW), Urine    Volume: 30 ml(s). Minimum 3 ml of urine is needed. Document temperature of fresh sample. Indications: Long term (current) use of opiate analgesic [phone])    Order Specific Question:   Release to patient    Answer:   Immediate    Follow-up plan:   Return in about 8 weeks (around 09/21/2021) for Medication Management, in person.     s/p IA hyalgan #1 on 08/19/2019, #2 09/23/2019 not very effective unfortunately.  Lumbar MRI shows facet disease at L3, L4, L5 bilaterally.  Bilateral L3, L4, L5 medial branch nerve block #1; right glenohumeral shoulder joint injection (anterior approach) 10/19/2020, 03/13/2021            Recent Visits Date Type Provider Dept  05/10/21 Telemedicine Gillis Santa, MD [facility]  Showing recent visits within past 90 days and meeting all other requirements Today's Visits Date Type Provider Dept  07/27/21 Office Visit Gillis Santa, MD [facility]  Showing today's visits and meeting all other requirements Future Appointments Date Type Provider Dept  09/21/21 Appointment Gillis Santa, MD [facility]  Showing future appointments within next 90 days and meeting all other requirements I discussed the assessment and treatment plan with the patient. The patient was provided an opportunity to ask questions and all were answered. The patient agreed with the plan and demonstrated an understanding of the instructions.  Patient advised to call back or seek an in-person evaluation if the symptoms or condition worsens.  Duration of encounter: 48mnutes.  Note by: BGillis Santa MD Date: 07/27/2021; Time: 10:12 AM

## 2021-07-28 NOTE — Telephone Encounter (Signed)
error 

## 2021-07-31 ENCOUNTER — Other Ambulatory Visit: Payer: Self-pay | Admitting: Student in an Organized Health Care Education/Training Program

## 2021-07-31 ENCOUNTER — Telehealth: Payer: Self-pay

## 2021-07-31 MED ORDER — HYDROCODONE-ACETAMINOPHEN 5-325 MG PO TABS
1.0000 | ORAL_TABLET | Freq: Three times a day (TID) | ORAL | 0 refills | Status: AC | PRN
Start: 2021-07-31 — End: 2021-08-30

## 2021-07-31 MED ORDER — HYDROCODONE-ACETAMINOPHEN 5-325 MG PO TABS: 1.0000 | ORAL_TABLET | Freq: Three times a day (TID) | ORAL | 0 refills | Status: DC | PRN

## 2021-07-31 NOTE — Telephone Encounter (Signed)
Rx was not sent in for Oxy Pt tried to pick it up on Saturday and was told it was not there

## 2021-07-31 NOTE — Telephone Encounter (Signed)
Spoke with pharmacy and they state sthat they did not receive the prescriptions.  Dr Holley Raring asked that patient pick another pharmacy since they do not have these and we arre having problems with them receiving the prescriptions that we have sent.  Patient notified and chose [facility].  Pharmacy changed in system.  Dr Holley Raring notified.

## 2021-08-01 LAB — TOXASSURE SELECT 13 (MW), URINE

## 2021-08-10 ENCOUNTER — Encounter: Payer: Self-pay | Admitting: Family Medicine

## 2021-08-10 ENCOUNTER — Other Ambulatory Visit: Payer: Self-pay

## 2021-08-10 ENCOUNTER — Ambulatory Visit (INDEPENDENT_AMBULATORY_CARE_PROVIDER_SITE_OTHER): Payer: 59 | Admitting: Family Medicine

## 2021-08-10 VITALS — BP 147/82 | HR 75 | Ht 68.0 in | Wt 221.0 lb

## 2021-08-10 DIAGNOSIS — Z1231 Encounter for screening mammogram for malignant neoplasm of breast: Secondary | ICD-10-CM

## 2021-08-10 DIAGNOSIS — Z1211 Encounter for screening for malignant neoplasm of colon: Secondary | ICD-10-CM

## 2021-08-10 DIAGNOSIS — Z780 Asymptomatic menopausal state: Secondary | ICD-10-CM

## 2021-08-10 DIAGNOSIS — Z01419 Encounter for gynecological examination (general) (routine) without abnormal findings: Secondary | ICD-10-CM

## 2021-08-10 NOTE — Progress Notes (Signed)
Subjective:     [name] is a 60 y.o. female and is here for a comprehensive physical exam. The patient reports no problems.      The following portions of the patient's history were reviewed and updated as appropriate: allergies, current medications, past family history, past medical history, past social history, past surgical history, and problem list.  Review of Systems Pertinent items noted in HPI and remainder of comprehensive ROS otherwise negative.   Objective:    BP (!) 147/82   Pulse 75   Ht '5\' 8"'$  (1.727 m)   Wt 221 lb (100.2 kg)   BMI 33.60 kg/m  General appearance: alert, cooperative, and appears stated age Head: Normocephalic, without obvious abnormality, atraumatic Neck: no adenopathy, supple, symmetrical, trachea midline, and thyroid not enlarged, symmetric, no tenderness/mass/nodules Lungs: clear to auscultation bilaterally Breasts: normal appearance, no masses or tenderness Heart: regular rate and rhythm, S1, S2 normal, no murmur, click, rub or gallop Abdomen: soft, non-tender; bowel sounds normal; no masses,  no organomegaly Extremities: extremities normal, atraumatic, no cyanosis or edema Pulses: 2+ and symmetric Skin: Skin color, texture, turgor normal. No rashes or lesions Lymph nodes: Cervical, supraclavicular, and axillary nodes normal. Neurologic: Grossly normal    Assessment:    Healthy female exam.      Plan:  Last pap was WNL and not due again until 2024 Menopause  Encounter for gynecological examination without abnormal finding  Encounter for screening mammogram for malignant neoplasm of breast - Plan: MM 3D SCREEN BREAST BILATERAL  Screen for colon cancer - Plan: Ambulatory referral to Gastroenterology    See After Visit Summary for Counseling Recommendations

## 2021-09-05 ENCOUNTER — Telehealth: Payer: Self-pay | Admitting: *Deleted

## 2021-09-05 NOTE — Telephone Encounter (Signed)
Spoke with [facility], they were needing Dx codes associated with hydrocodone - apap.  Information given.

## 2021-09-08 ENCOUNTER — Ambulatory Visit: Payer: 59

## 2021-09-21 ENCOUNTER — Encounter: Payer: Self-pay | Admitting: Student in an Organized Health Care Education/Training Program

## 2021-09-21 ENCOUNTER — Other Ambulatory Visit: Payer: Self-pay

## 2021-09-21 ENCOUNTER — Ambulatory Visit
Payer: 59 | Attending: Student in an Organized Health Care Education/Training Program | Admitting: Student in an Organized Health Care Education/Training Program

## 2021-09-21 VITALS — BP 141/98 | HR 100 | Temp 97.2°F | Resp 16 | Ht 68.0 in | Wt 200.0 lb

## 2021-09-21 DIAGNOSIS — M797 Fibromyalgia: Secondary | ICD-10-CM

## 2021-09-21 DIAGNOSIS — M19011 Primary osteoarthritis, right shoulder: Secondary | ICD-10-CM | POA: Insufficient documentation

## 2021-09-21 DIAGNOSIS — M47816 Spondylosis without myelopathy or radiculopathy, lumbar region: Secondary | ICD-10-CM | POA: Diagnosis not present

## 2021-09-21 DIAGNOSIS — G8929 Other chronic pain: Secondary | ICD-10-CM | POA: Diagnosis present

## 2021-09-21 DIAGNOSIS — M25511 Pain in right shoulder: Secondary | ICD-10-CM

## 2021-09-21 DIAGNOSIS — M25532 Pain in left wrist: Secondary | ICD-10-CM | POA: Insufficient documentation

## 2021-09-21 DIAGNOSIS — M75101 Unspecified rotator cuff tear or rupture of right shoulder, not specified as traumatic: Secondary | ICD-10-CM | POA: Diagnosis present

## 2021-09-21 DIAGNOSIS — M19039 Primary osteoarthritis, unspecified wrist: Secondary | ICD-10-CM | POA: Insufficient documentation

## 2021-09-21 DIAGNOSIS — M25531 Pain in right wrist: Secondary | ICD-10-CM

## 2021-09-21 DIAGNOSIS — G894 Chronic pain syndrome: Secondary | ICD-10-CM | POA: Diagnosis present

## 2021-09-21 DIAGNOSIS — M12811 Other specific arthropathies, not elsewhere classified, right shoulder: Secondary | ICD-10-CM | POA: Diagnosis present

## 2021-09-21 HISTORY — DX: Pain in right wrist: M25.531

## 2021-09-21 HISTORY — DX: Fibromyalgia: M79.7

## 2021-09-21 MED ORDER — HYDROCODONE-ACETAMINOPHEN 5-325 MG PO TABS: 1.0000 | ORAL_TABLET | Freq: Three times a day (TID) | ORAL | 0 refills | Status: AC | PRN

## 2021-09-21 MED ORDER — HYDROCODONE-ACETAMINOPHEN 5-325 MG PO TABS
1.0000 | ORAL_TABLET | Freq: Three times a day (TID) | ORAL | 0 refills | Status: DC | PRN
Start: 2021-12-01 — End: 2021-12-26

## 2021-09-21 MED ORDER — AMITRIPTYLINE HCL 50 MG PO TABS: 50.0000 mg | ORAL_TABLET | Freq: Every day | ORAL | 5 refills | Status: DC

## 2021-09-21 MED ORDER — GABAPENTIN 600 MG PO TABS
600.0000 mg | ORAL_TABLET | Freq: Every day | ORAL | 5 refills | Status: DC
Start: 2021-09-21 — End: 2022-03-22

## 2021-09-21 NOTE — Progress Notes (Signed)
PROVIDER NOTE: Information contained herein reflects review and annotations entered in association with encounter. Interpretation of such information and data should be left to medically-trained personnel. Information provided to patient can be located elsewhere in the medical record under "Patient Instructions". Document created using STT-dictation technology, any transcriptional errors that may result from process are unintentional.    Patient: [name]  Service Category: E/M  Provider: Gillis Santa, MD  DOB: [birth year]  DOS: 09/21/2021  Specialty: Interventional Pain Management  MRN: [id number]  Setting: Ambulatory outpatient  PCP: Marval Regal, NP  Type: Established Patient    Referring Provider: Marval Regal, NP  Location: Office  Delivery: Face-to-face     HPI  [name], a 60 y.o. year old female, is here today because of her Fibromyalgia [M79.7]. Ms. Rasheed primary complain today is Pain and Joint Pain Last encounter: My last encounter with her was on 07/27/21 Pertinent problems: Ms. Vanasten has Obesity; Arthritis of wrist; Chronic pain syndrome; Lumbar spondylosis; Bilateral primary osteoarthritis of knee; Bilateral hip pain; Chronic SI joint pain; Lumbar radiculopathy; and Long-term current use of opiate analgesic on their pertinent problem list. Pain Assessment: Severity of Chronic pain is reported as a 6 /10. Location: Other (Comment) (joint pain)  /denies. Onset: More than a month ago. Quality: Constant, Discomfort. Timing: Constant. Modifying factor(s): meds, heat, epsom salt. Vitals:  height is '5\' 8"'  (1.727 m) and weight is 200 lb (90.7 kg). Her temporal temperature is 97.2 F (36.2 C) (abnormal). Her blood pressure is 141/98 (abnormal) and her pulse is 100. Her respiration is 16 and oxygen saturation is 99%.   Reason for encounter:   -Follows up for MM -Is endorsing analgesic and sleep benefit with with amitriptyline at 50 mg, states that mood is  improved -Is also taking Hydrocodone every 8-12 hrs depending on pain level -Has improved diet, decreased sugar intake, is also walking more -More motivated to complete activities -increased bilateral radiocarpal pain.  Patient sits at a desk and is often on the computer.  Discussed occupational measures that she can employ to help decrease her pain by having a gel support for her mouse or keyboard.  Also discussed fluoroscopy guided scaphoid lunate injection.  Pharmacotherapy Assessment  Analgesic: hydrocodone 5 mg every 8 hours as needed, quantity 90/month    Monitoring: [place] PMP: PDMP reviewed during this encounter.       Pharmacotherapy: No side-effects or adverse reactions reported. Compliance: No problems identified. Effectiveness: Clinically acceptable.  [name], RN  09/21/2021  8:35 AM  Sign when Signing Visit Nursing Pain Medication Assessment:  Safety precautions to be maintained throughout the outpatient stay will include: orient to surroundings, keep bed in low position, maintain call bell within reach at all times, provide assistance with transfer out of bed and ambulation.  Medication Inspection Compliance: Pill count conducted under aseptic conditions, in front of the patient. Neither the pills nor the bottle was removed from the patient's sight at any time. Once count was completed pills were immediately returned to the patient in their original bottle.  Medication: Hydrocodone/APAP Pill/Patch Count:  55 of 90 pills remain Pill/Patch Appearance: Markings consistent with prescribed medication Bottle Appearance: Standard pharmacy container. Clearly labeled. Filled Date: 09 / 17 / 2022 Last Medication intake:  Today      UDS:  Summary  Date Value Ref Range Status  07/27/2021 Note  Final    Comment:    ==================================================================== [facility]  (MW) ==================================================================== Test  Result       Flag       Units  Drug Present and Declared for Prescription Verification   Hydrocodone                    118          EXPECTED   ng/mg creat   Norhydrocodone                 297          EXPECTED   ng/mg creat    Sources of hydrocodone include scheduled prescription medications.    Norhydrocodone is an expected metabolite of hydrocodone.  ==================================================================== Test                      Result    Flag   Units      Ref Range   Creatinine              94               mg/dL      >=20 ==================================================================== Declared Medications:  The flagging and interpretation on this report are based on the  following declared medications.  Unexpected results may arise from  inaccuracies in the declared medications.   **Note: The testing scope of this panel includes these medications:   Hydrocodone (Norco)   **Note: The testing scope of this panel does not include the  following reported medications:   Acetaminophen (Norco)  Amitriptyline (Elavil)  Duloxetine (Cymbalta)  Gabapentin (Neurontin)  Melatonin  Multivitamin  Omeprazole (Prilosec)  Valacyclovir (Valtrex) ==================================================================== For clinical consultation, please call [phone]. ====================================================================      ROS  Constitutional: Denies any fever or chills Gastrointestinal: No reported hemesis, hematochezia, vomiting, or acute GI distress Musculoskeletal:  Diffuse arthralgias and myalgias , bilateral wrist, thumb pain Neurological: No reported episodes of acute onset apraxia, aphasia, dysarthria, agnosia, amnesia, paralysis, loss of coordination, or loss of consciousness  Medication Review  HYDROcodone-acetaminophen, Melatonin,  amitriptyline, gabapentin, multivitamin, omeprazole, and valACYclovir  History Review  Allergy: Ms. Nickle has No Known Allergies. Drug: Ms. Marcantel  reports no history of drug use. Alcohol:  reports that she does not currently use alcohol. Tobacco:  reports that she quit smoking about 18 years ago. Her smoking use included cigarettes. She has never used smokeless tobacco. Social: Ms. Urbas  reports that she quit smoking about 18 years ago. Her smoking use included cigarettes. She has never used smokeless tobacco. She reports that she does not currently use alcohol. She reports that she does not use drugs. Medical:  has a past medical history of Arthritis, Degenerative disorder of bone, Genuine stress incontinence, female (2012), GERD (gastroesophageal reflux disease), Migraines, and Obesity. Surgical: Ms. Clinkscales  has a past surgical history that includes Laparoscopic hysterectomy (10/23/2010); Midurethral sling (05/02/2011); and excision of vulvar lesion (05/02/2011). Family: family history includes Arthritis in her maternal grandmother and mother; Cancer in her father and paternal grandfather; Diabetes in her son; Heart attack in her maternal grandfather, mother, and paternal grandmother; Hypertension in her mother.  Laboratory Chemistry Profile   Renal Lab Results  Component Value Date   BUN 10 02/24/2021   CREATININE 0.83 02/24/2021   BCR 12 06/16/2018   GFR 74.95 02/09/2021   GFRAA 92 06/16/2018   GFRNONAA >60 02/24/2021    Hepatic Lab Results  Component Value Date   AST 18 02/01/2021   ALT 18 02/01/2021   ALBUMIN 4.1 02/01/2021  ALKPHOS 81 02/01/2021    Electrolytes Lab Results  Component Value Date   NA 138 02/24/2021   K 3.9 02/24/2021   CL 107 02/24/2021   CALCIUM 9.8 02/24/2021   MG 1.8 01/30/2019    Bone Lab Results  Component Value Date   VD25OH 41.01 10/28/2020    Inflammation (CRP: Acute Phase) (ESR: Chronic Phase) No results found for: CRP, ESRSEDRATE,  LATICACIDVEN       Note: Above Lab results reviewed.   Physical Exam  General appearance: Well nourished, well developed, and well hydrated. In no apparent acute distress Mental status: Alert, oriented x 3 (person, place, & time)       Respiratory: No evidence of acute respiratory distress Eyes: PERLA Vitals: BP (!) 141/98   Pulse 100   Temp (!) 97.2 F (36.2 C) (Temporal)   Resp 16   Ht '5\' 8"'  (1.727 m)   Wt 200 lb (90.7 kg)   SpO2 99%   BMI 30.41 kg/m  BMI: Estimated body mass index is 30.41 kg/m as calculated from the following:   Height as of this encounter: '5\' 8"'  (1.727 m).   Weight as of this encounter: 200 lb (90.7 kg). Ideal: Ideal body weight: 63.9 kg (140 lb 14 oz) Adjusted ideal body weight: 74.6 kg (164 lb 8.4 oz)  Upper Extremity (UE) Exam      Side: Right upper extremity   Side: Left upper extremity  Skin & Extremity Inspection: Skin color, temperature, and hair growth are WNL. No peripheral edema or cyanosis. No masses, redness, swelling, asymmetry, or associated skin lesions. No contractures.   Skin & Extremity Inspection: Skin color, temperature, and hair growth are WNL. No peripheral edema or cyanosis. No masses, redness, swelling, asymmetry, or associated skin lesions. No contractures.  Functional ROM: Unrestricted range of motion   Functional ROM: Unrestricted ROM          Muscle Tone/Strength: Functionally intact. No obvious neuro-muscular anomalies detected.   Muscle Tone/Strength: Functionally intact. No obvious neuro-muscular anomalies detected.  Sensory (Neurological): Arthropathic arthralgia,      Sensory (Neurological): left wrist pain,        Palpation: No palpable anomalies               Palpation: No palpable anomalies              Provocative Test(s):  Phalen's test: deferred Tinel's test: deferred Apley's scratch test (touch opposite shoulder):  Action 1 (Across chest): Decreased ROM, improved after treatment Action 2 (Overhead): Decreased ROM,  improved after treatment Action 3 (LB reach): Decreased ROM, improved after treatment     Provocative Test(s):  Phalen's test: deferred Tinel's test: deferred Apley's scratch test (touch opposite shoulder):  Action 1 (Across chest): deferred Action 2 (Overhead): deferred Action 3 (LB reach): deferred      Bilateral thumb pain, tender to palpation   Lumbar Spine Area Exam  Skin & Axial Inspection: No masses, redness, or swelling Alignment: Symmetrical Functional ROM: Pain restricted ROM right greater than left       Stability: No instability detected Muscle Tone/Strength: Functionally intact. No obvious neuro-muscular anomalies detected. Sensory (Neurological): Dermatomal pain pattern on the right Palpation: No palpable anomalies       Provocative Tests: Hyperextension/rotation test: (+) bilaterally for facet joint pain.,  Improved after treatment Lumbar quadrant test (Kemp's test): (+) on the right for foraminal stenosis   *(Flexion, ABduction and External Rotation) Gait & Posture Assessment  Ambulation: Unassisted Gait: Relatively normal for  age and body habitus Posture: WNL  Lower Extremity Exam      Side: Right lower extremity   Side: Left lower extremity  Stability: No instability observed           Stability: No instability observed          Skin & Extremity Inspection: Skin color, temperature, and hair growth are WNL. No peripheral edema or cyanosis. No masses, redness, swelling, asymmetry, or associated skin lesions. No contractures.   Skin & Extremity Inspection: Skin color, temperature, and hair growth are WNL. No peripheral edema or cyanosis. No masses, redness, swelling, asymmetry, or associated skin lesions. No contractures.  Functional ROM: Pain restricted ROM for hip and knee joints Limited SLR (straight leg raise)   Functional ROM: Pain restricted ROM for hip and knee joints          Muscle Tone/Strength: Functionally intact. No obvious neuro-muscular anomalies  detected.   Muscle Tone/Strength: Functionally intact. No obvious neuro-muscular anomalies detected.  Sensory (Neurological): Unimpaired         Sensory (Neurological): Unimpaired        DTR: Patellar: deferred today Achilles: deferred today Plantar: deferred today   DTR: Patellar: deferred today Achilles: deferred today Plantar: deferred today  Palpation: No palpable anomalies   Palpation: No palpable anomalies      Assessment   Status Diagnosis  Controlled Having a Flare-up Controlled 1. Fibromyalgia   2. Arthritis of wrist   3. Lumbar spondylosis   4. Bilateral wrist pain   5. Chronic right shoulder pain   6. Primary osteoarthritis of right shoulder   7. Right rotator cuff tear arthropathy   8. Lumbar facet arthropathy   9. Chronic pain syndrome        Plan of Care    [name] has a current medication list which includes the following long-term medication(s): amitriptyline, omeprazole, and gabapentin.   Discussed evidence-based therapies for fibromyalgia management. 1.  Continue amitriptyline at 50 mg. 2.  Discontinue Cymbalta 3.  Refill of hydrocodone as below, no change in dose.  UDS up-to-date and appropriate. 4.  Bilateral radiocarpal injection under fluoroscopy for arthritis.  Future considerations: Buprenorphine, extended release tramadol if optimization of nonopioid analgesics is not successful.   Pharmacotherapy (Medications Ordered): Meds ordered this encounter  Medications   gabapentin (NEURONTIN) 600 MG tablet    Sig: Take 1 tablet (600 mg total) by mouth at bedtime. 300 mg qday, 600 mg qhs    Dispense:  30 tablet    Refill:  5   HYDROcodone-acetaminophen (NORCO/VICODIN) 5-325 MG tablet    Sig: Take 1 tablet by mouth every 8 (eight) hours as needed for moderate pain.    Dispense:  90 tablet    Refill:  0   HYDROcodone-acetaminophen (NORCO/VICODIN) 5-325 MG tablet    Sig: Take 1 tablet by mouth every 8 (eight) hours as needed for  moderate pain.    Dispense:  90 tablet    Refill:  0   HYDROcodone-acetaminophen (NORCO/VICODIN) 5-325 MG tablet    Sig: Take 1 tablet by mouth every 8 (eight) hours as needed for moderate pain.    Dispense:  90 tablet    Refill:  0   amitriptyline (ELAVIL) 50 MG tablet    Sig: Take 1 tablet (50 mg total) by mouth at bedtime.    Dispense:  30 tablet    Refill:  5    Orders:  Orders Placed This Encounter  Procedures   Medium Joint  Injection/Arthrocentesis    B/L wrist injection    Standing Status:   Future    Standing Expiration Date:   12/22/2021    Scheduling Instructions:     2 weeks    Follow-up plan:   Return in about 2 weeks (around 10/05/2021) for bilateral wrist injection , without sedation.     s/p IA hyalgan #1 on 08/19/2019, #2 09/23/2019 not very effective unfortunately.  Lumbar MRI shows facet disease at L3, L4, L5 bilaterally.  Bilateral L3, L4, L5 medial branch nerve block #1; right glenohumeral shoulder joint injection (anterior approach) 10/19/2020, 03/13/2021            Recent Visits Date Type Provider Dept  07/27/21 Office Visit Gillis Santa, MD [facility]  Showing recent visits within past 90 days and meeting all other requirements Today's Visits Date Type Provider Dept  09/21/21 Office Visit Gillis Santa, MD [facility]  Showing today's visits and meeting all other requirements Future Appointments No visits were found meeting these conditions. Showing future appointments within next 90 days and meeting all other requirements I discussed the assessment and treatment plan with the patient. The patient was provided an opportunity to ask questions and all were answered. The patient agreed with the plan and demonstrated an understanding of the instructions.  Patient advised to call back or seek an in-person evaluation if the symptoms or condition worsens.  Duration of encounter: 32mnutes.  Note by: BGillis Santa MD Date: 09/21/2021; Time:  9:59 AM

## 2021-09-21 NOTE — Progress Notes (Signed)
Nursing Pain Medication Assessment:  Safety precautions to be maintained throughout the outpatient stay will include: orient to surroundings, keep bed in low position, maintain call bell within reach at all times, provide assistance with transfer out of bed and ambulation.  Medication Inspection Compliance: Pill count conducted under aseptic conditions, in front of the patient. Neither the pills nor the bottle was removed from the patient's sight at any time. Once count was completed pills were immediately returned to the patient in their original bottle.  Medication: Hydrocodone/APAP Pill/Patch Count:  55 of 90 pills remain Pill/Patch Appearance: Markings consistent with prescribed medication Bottle Appearance: Standard pharmacy container. Clearly labeled. Filled Date: 09 / 17 / 2022 Last Medication intake:  Today

## 2021-09-28 ENCOUNTER — Telehealth: Payer: Self-pay

## 2021-09-28 NOTE — Telephone Encounter (Signed)
Patient is calling because she is ready to schedule screening colonoscopy. Please call patient back

## 2021-09-28 NOTE — Telephone Encounter (Signed)
Returned patients call. LVM to call office back. 

## 2021-10-15 IMAGING — MR MR LUMBAR SPINE W/O CM
5 series · 31 of 48 positions shown · non-contrast
Comparison: None.

CLINICAL DATA: Five years of progressively worsening central and
right lower back pain. Pain radiates occasionally into the right
thigh.

EXAM:
MRI LUMBAR SPINE WITHOUT CONTRAST
TECHNIQUE: Multiplanar, multisequence MR imaging of the lumbar spine was
performed. No intravenous contrast was administered.

[Series 5: T2 · sagittal · 4.0mm · 0.81mm/px · 6 of 17 slices shown (1 of 2)]
[im 1/17]
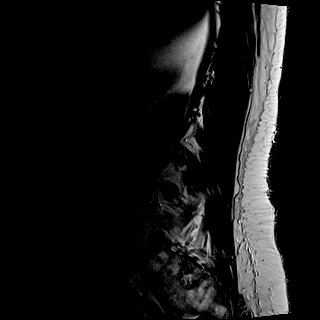
[im 4/17]
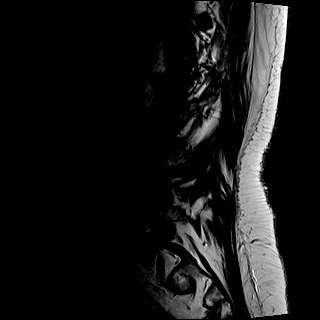
[im 7/17]
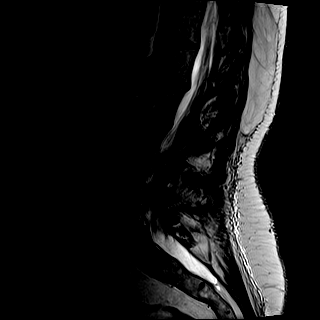
[im 10/17]
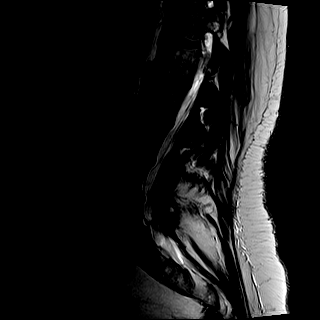
[im 13/17]
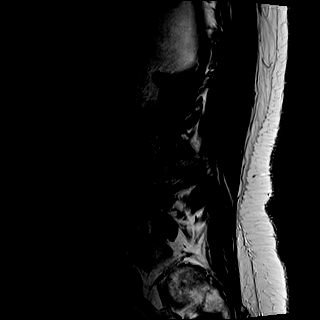
[im 17/17]
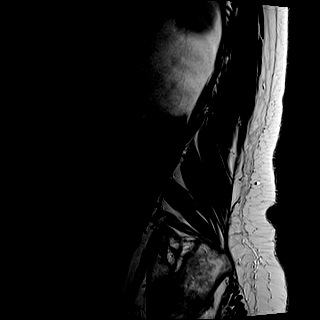

[Series 6: T1 · sagittal · 4.0mm · 0.81mm/px · 7 of 17 slices shown (1 of 2)]
[im 1/17]
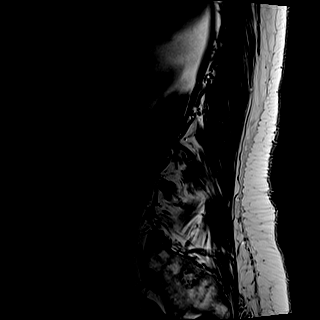
[im 3/17]
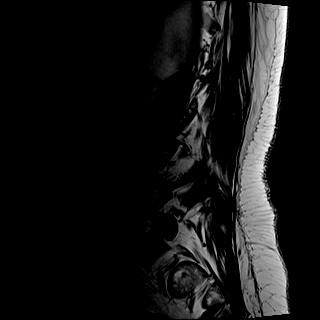
[im 6/17]
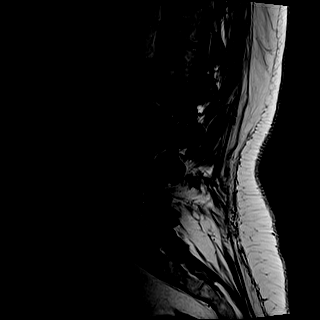
[im 9/17]
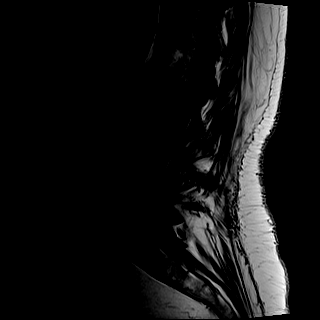
[im 11/17]
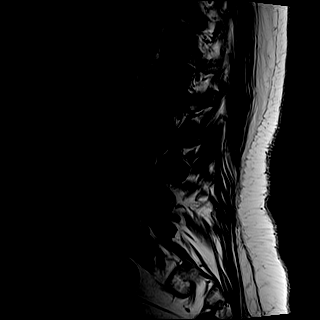
[im 14/17]
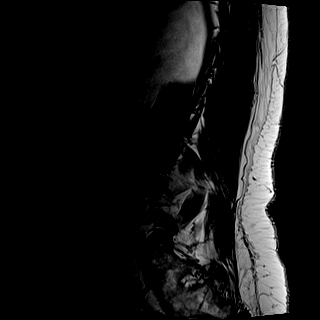
[im 17/17]
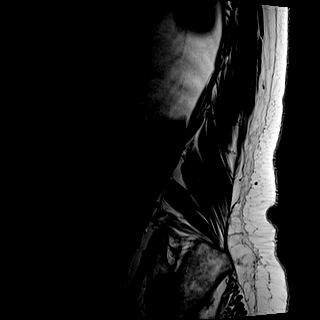

[Series 7: STIR · sagittal · 4.0mm · 0.41mm/px · 2 of 17 slices shown]
[im 1/17]
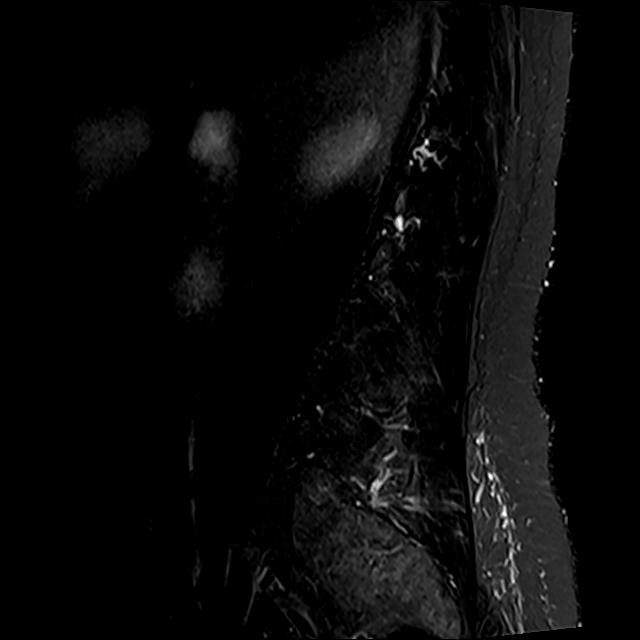
[im 3/17]
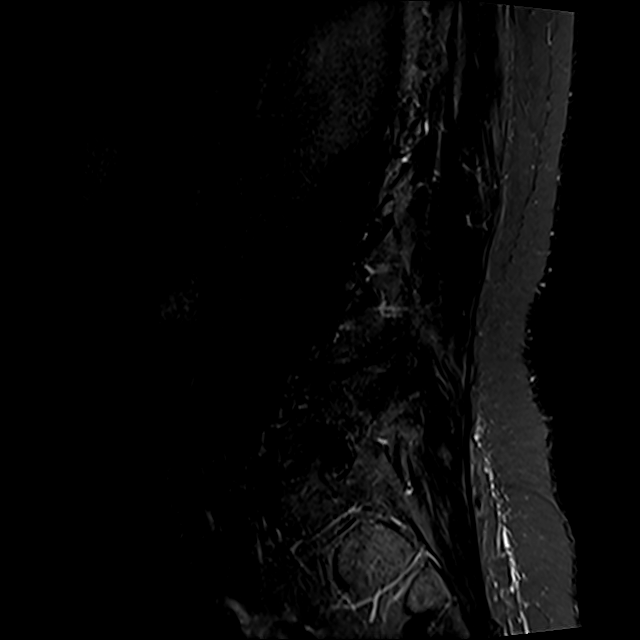

[Series 8: T2 · axial · 4.0mm · 0.78mm/px · z∈[-68,+137]mm · 8 of 36 slices shown (2 of 2)]
[im 1/36]
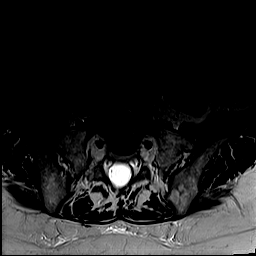
[im 6/36]
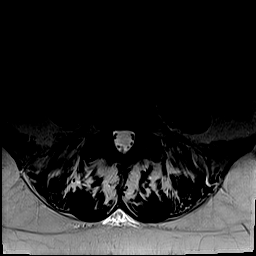
[im 11/36]
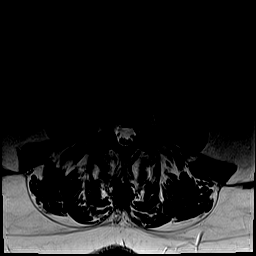
[im 17/36]
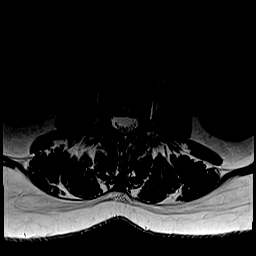
[im 19/36]
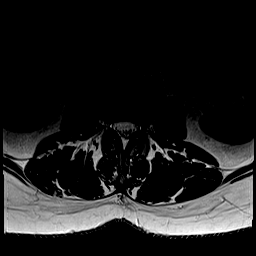
[im 25/36]
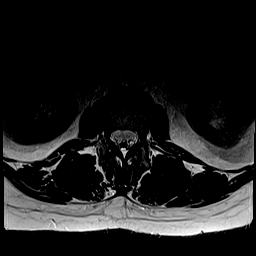
[im 30/36]
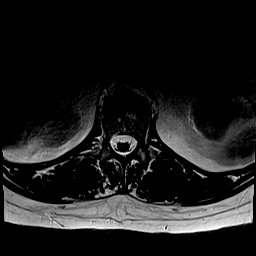
[im 36/36]
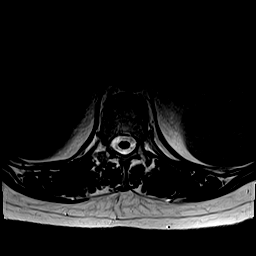

[Series 9: T1 · axial · 4.0mm · 0.39mm/px · z∈[-68,+137]mm · 8 of 36 slices shown (2 of 2)]
[im 1/36]
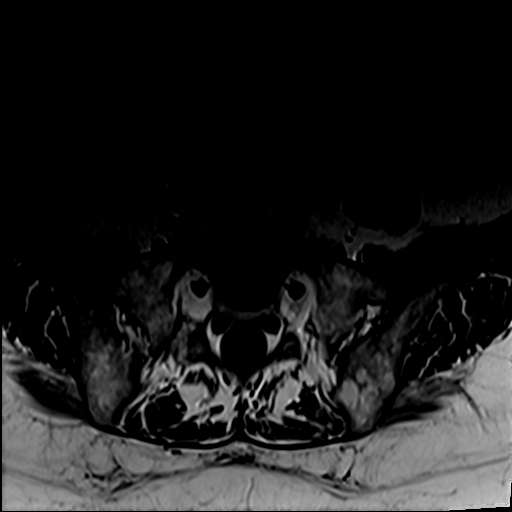
[im 6/36]
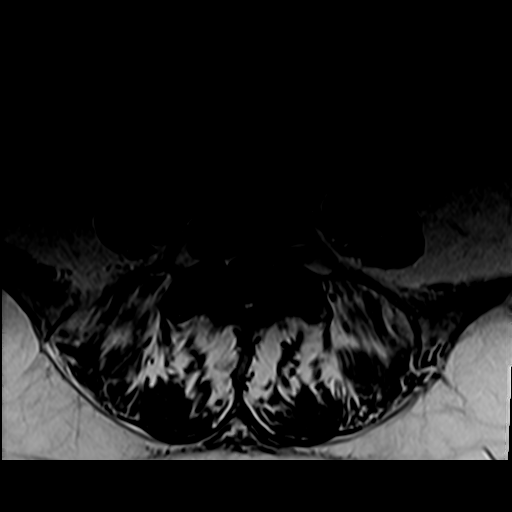
[im 11/36]
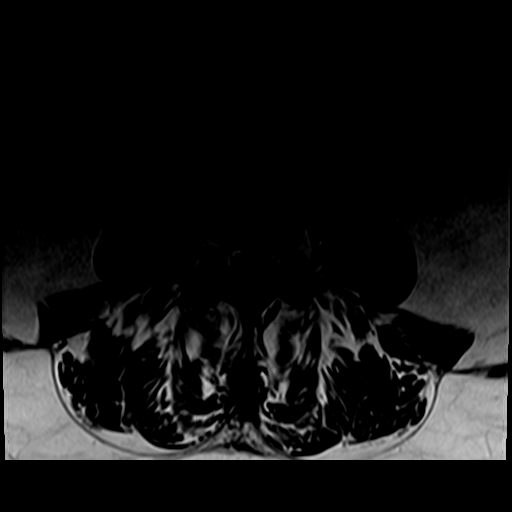
[im 17/36]
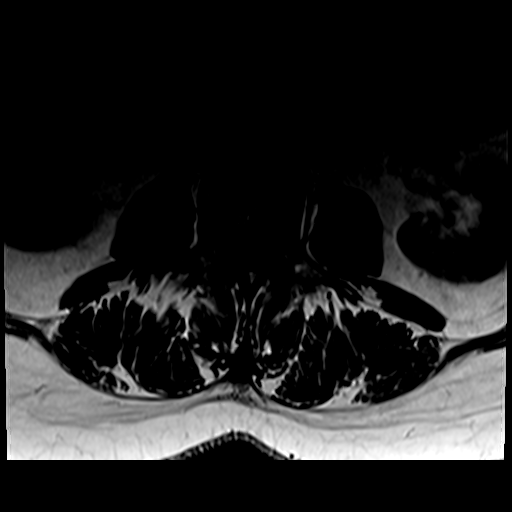
[im 19/36]
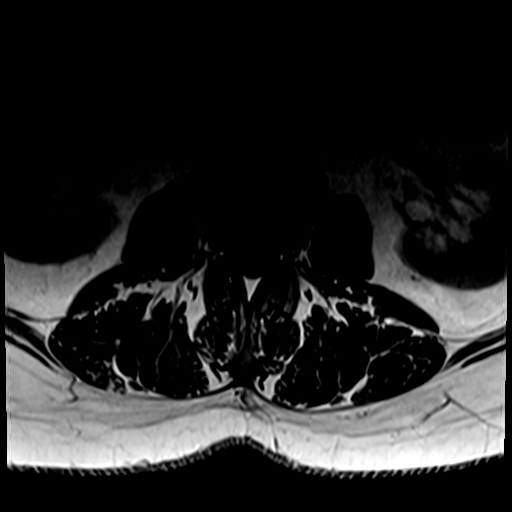
[im 25/36]
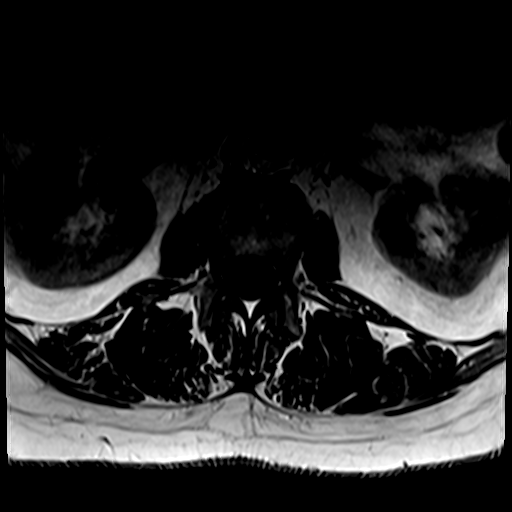
[im 30/36]
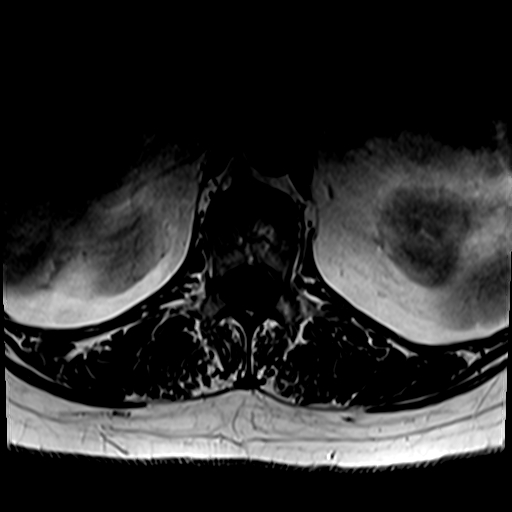
[im 36/36]
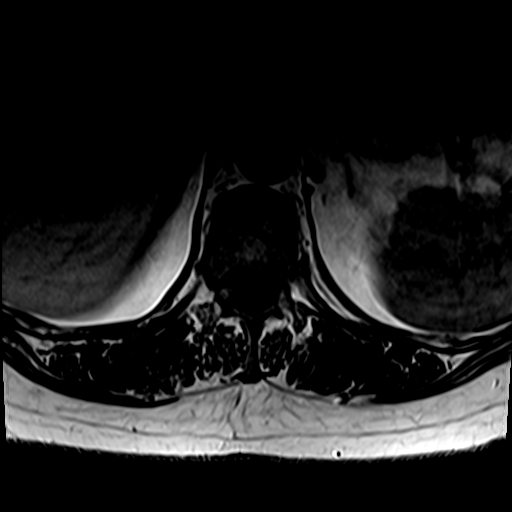

[31 of 48 positions shown; findings below may reference images not displayed]

FINDINGS: Segmentation:  5 lumbar type vertebrae

Alignment:  Exaggerated lumbar lordosis

Vertebrae:  No fracture, evidence of discitis, or bone lesion.

Conus medullaris and cauda equina: Conus extends to the L2 level.
Conus and cauda equina appear normal.

Paraspinal and other soft tissues: Negative.

Disc levels:

T12- L1: Unremarkable.

L1-L2: Disc narrowing and bulging.  Ventral spondylitic spurring.

L2-L3: Disc narrowing and ventral spondylitic spurring.

L3-L4: Disc narrowing and bulging with mild facet spurring. No
impingement

L4-L5: Degenerative facet spurring and ligamentum flavum thickening.
Mild disc bulging. No impingement

L5-S1:Mild disc narrowing and bulging. Mild facet spurring. No
impingement
IMPRESSION: Generalized overall mild degenerative change without neural
impingement or inflammatory finding.

## 2021-10-29 ENCOUNTER — Other Ambulatory Visit: Payer: Self-pay | Admitting: Student in an Organized Health Care Education/Training Program

## 2021-12-01 ENCOUNTER — Ambulatory Visit: Payer: 59

## 2021-12-21 ENCOUNTER — Encounter: Payer: 59 | Admitting: Student in an Organized Health Care Education/Training Program

## 2021-12-26 ENCOUNTER — Encounter: Payer: Self-pay | Admitting: Student in an Organized Health Care Education/Training Program

## 2021-12-26 ENCOUNTER — Other Ambulatory Visit: Payer: Self-pay

## 2021-12-26 ENCOUNTER — Ambulatory Visit
Payer: 59 | Attending: Student in an Organized Health Care Education/Training Program | Admitting: Student in an Organized Health Care Education/Training Program

## 2021-12-26 VITALS — BP 157/100 | HR 113 | Temp 96.3°F | Resp 18 | Ht 68.0 in | Wt 220.0 lb

## 2021-12-26 DIAGNOSIS — M47816 Spondylosis without myelopathy or radiculopathy, lumbar region: Secondary | ICD-10-CM | POA: Insufficient documentation

## 2021-12-26 DIAGNOSIS — M19011 Primary osteoarthritis, right shoulder: Secondary | ICD-10-CM | POA: Insufficient documentation

## 2021-12-26 DIAGNOSIS — M5412 Radiculopathy, cervical region: Secondary | ICD-10-CM

## 2021-12-26 DIAGNOSIS — M170 Bilateral primary osteoarthritis of knee: Secondary | ICD-10-CM | POA: Diagnosis present

## 2021-12-26 DIAGNOSIS — G894 Chronic pain syndrome: Secondary | ICD-10-CM | POA: Insufficient documentation

## 2021-12-26 HISTORY — DX: Radiculopathy, cervical region: M54.12

## 2021-12-26 MED ORDER — HYDROCODONE-ACETAMINOPHEN 5-325 MG PO TABS: 1.0000 | ORAL_TABLET | Freq: Three times a day (TID) | ORAL | 0 refills | Status: AC | PRN

## 2021-12-26 MED ORDER — HYDROCODONE-ACETAMINOPHEN 5-325 MG PO TABS: 1.0000 | ORAL_TABLET | Freq: Three times a day (TID) | ORAL | 0 refills | Status: DC | PRN

## 2021-12-26 NOTE — Progress Notes (Signed)
PROVIDER NOTE: Information contained herein reflects review and annotations entered in association with encounter. Interpretation of such information and data should be left to medically-trained personnel. Information provided to patient can be located elsewhere in the medical record under "Patient Instructions". Document created using STT-dictation technology, any transcriptional errors that may result from process are unintentional.    Patient: [name]  Service Category: E/M  Provider: Gillis Santa, MD  DOB: August 24, 1961  DOS: 12/26/2021  Specialty: Interventional Pain Management  MRN: [id number]  Setting: Ambulatory outpatient  PCP: Vanessa Regal, NP  Type: Established Patient    Referring Provider: Marval Regal, NP  Location: Office  Delivery: Face-to-face     HPI  [name], a 61 y.o. year old female, is here today because of her Cervical radicular pain [M54.12]. [name] primary complain today is Leg Pain (Bilateral knee and ankles) and Neck Pain (Radiates down to left shoulder, tingling pain)  Last encounter: My last encounter with her was on 09/21/2021  Pertinent problems: [name] has Obesity; Arthritis of wrist; Chronic pain syndrome; Lumbar facet arthropathy; Bilateral primary osteoarthritis of knee; Bilateral hip pain; Chronic SI joint pain; Lumbar radiculopathy; Long-term current use of opiate analgesic; Right rotator cuff tear arthropathy; Chronic right shoulder pain; Fibromyalgia; and Cervical radicular pain (left) on their pertinent problem list. Pain Assessment: Severity of Chronic pain is reported as a 8 /10. Location: Knee (ankle) Right, Left/denies. Onset: More than a month ago. Quality: Discomfort (feels like knees dont work anymore). Timing: Constant. Modifying factor(s): lying down. Vitals:  height is '5\' 8"'  (1.727 m) and weight is 220 lb (99.8 kg). Her temperature is 96.3 F (35.7 C) (abnormal). Her blood pressure is 157/100 (abnormal) and her pulse  is 113 (abnormal). Her respiration is 18 and oxygen saturation is 100%.   Reason for encounter:   -Follows up for MM as well as new onset paresthesias from her left neck down to her left shoulder.  No inciting or traumatic event. -Increased bilateral knee pain related to knee osteoarthritis.  Has done physical therapy, NSAIDs, aquatic therapy as well as intra-articular knee steroid and viscosupplementation with no long-term benefit.  Before consideration of knee replacement, we discussed genicular nerve block and possible RFA.  Risks and benefits reviewed and patient would like to trial this.  Pharmacotherapy Assessment  Analgesic: hydrocodone 5 mg every 8 hours as needed, quantity 90/month    Monitoring: [place] PMP: PDMP reviewed during this encounter.       Pharmacotherapy: No side-effects or adverse reactions reported. Compliance: No problems identified. Effectiveness: Clinically acceptable.  Vanessa Shorter, RN  12/26/2021  8:57 AM  Sign when Signing Visit Nursing Pain Medication Assessment:  Safety precautions to be maintained throughout the outpatient stay will include: orient to surroundings, keep bed in low position, maintain call bell within reach at all times, provide assistance with transfer out of bed and ambulation.  Medication Inspection Compliance: Pill count conducted under aseptic conditions, in front of the patient. Neither the pills nor the bottle was removed from the patient's sight at any time. Once count was completed pills were immediately returned to the patient in their original bottle.  Medication: Hydrocodone/APAP Pill/Patch Count:  35 of 90 pills remain Pill/Patch Appearance: Markings consistent with prescribed medication Bottle Appearance: Standard pharmacy container. Clearly labeled. Filled Date: 62 / 19 / 2022 Last Medication intake:  Yesterday   UDS:  Summary  Date Value Ref Range Status  07/27/2021 Note  Final  Comment:     ==================================================================== [facility] 13 (MW) ==================================================================== Test                             Result       Flag       Units  Drug Present and Declared for Prescription Verification   Hydrocodone                    118          EXPECTED   ng/mg creat   Norhydrocodone                 297          EXPECTED   ng/mg creat    Sources of hydrocodone include scheduled prescription medications.    Norhydrocodone is an expected metabolite of hydrocodone.  ==================================================================== Test                      Result    Flag   Units      Ref Range   Creatinine              94               mg/dL      >=20 ==================================================================== Declared Medications:  The flagging and interpretation on this report are based on the  following declared medications.  Unexpected results may arise from  inaccuracies in the declared medications.   **Note: The testing scope of this panel includes these medications:   Hydrocodone (Norco)   **Note: The testing scope of this panel does not include the  following reported medications:   Acetaminophen (Norco)  Amitriptyline (Elavil)  Duloxetine (Cymbalta)  Gabapentin (Neurontin)  Melatonin  Multivitamin  Omeprazole (Prilosec)  Valacyclovir (Valtrex) ==================================================================== For clinical consultation, please call [phone]. ====================================================================      ROS  Constitutional: Denies any fever or chills Gastrointestinal: No reported hemesis, hematochezia, vomiting, or acute GI distress Musculoskeletal:  Diffuse arthralgias and myalgias , bilateral wrist, thumb pain Neurological: No reported episodes of acute onset apraxia, aphasia, dysarthria, agnosia, amnesia, paralysis, loss of  coordination, or loss of consciousness  Medication Review  HYDROcodone-acetaminophen, Melatonin, amitriptyline, gabapentin, multivitamin, omeprazole, and valACYclovir  History Review  Allergy: [name] has No Known Allergies. Drug: Ms. Munoz  reports no history of drug use. Alcohol:  reports that she does not currently use alcohol. Tobacco:  reports that she quit smoking about 18 years ago. Her smoking use included cigarettes. She has never used smokeless tobacco. Social: Ms. Miles  reports that she quit smoking about 18 years ago. Her smoking use included cigarettes. She has never used smokeless tobacco. She reports that she does not currently use alcohol. She reports that she does not use drugs. Medical:  has a past medical history of Arthritis, Degenerative disorder of bone, Genuine stress incontinence, female (2012), GERD (gastroesophageal reflux disease), Migraines, and Obesity. Surgical: Ms. Stepanian  has a past surgical history that includes Laparoscopic hysterectomy (10/23/2010); Midurethral sling (05/02/2011); and excision of vulvar lesion (05/02/2011). Family: family history includes Arthritis in her maternal grandmother and mother; Cancer in her father and paternal grandfather; Diabetes in her son; Heart attack in her maternal grandfather, mother, and paternal grandmother; Hypertension in her mother.  Laboratory Chemistry Profile   Renal Lab Results  Component Value Date   BUN 10 02/24/2021   CREATININE 0.83 02/24/2021   BCR 12 06/16/2018  GFR 74.95 02/09/2021   GFRAA 92 06/16/2018   GFRNONAA >60 02/24/2021    Hepatic Lab Results  Component Value Date   AST 18 02/01/2021   ALT 18 02/01/2021   ALBUMIN 4.1 02/01/2021   ALKPHOS 81 02/01/2021    Electrolytes Lab Results  Component Value Date   NA 138 02/24/2021   K 3.9 02/24/2021   CL 107 02/24/2021   CALCIUM 9.8 02/24/2021   MG 1.8 01/30/2019    Bone Lab Results  Component Value Date   VD25OH 41.01 10/28/2020     Inflammation (CRP: Acute Phase) (ESR: Chronic Phase) No results found for: CRP, ESRSEDRATE, LATICACIDVEN       Note: Above Lab results reviewed.   Physical Exam  General appearance: Well nourished, well developed, and well hydrated. In no apparent acute distress Mental status: Alert, oriented x 3 (person, place, & time)       Respiratory: No evidence of acute respiratory distress Eyes: PERLA Vitals: BP (!) 157/100    Pulse (!) 113    Temp (!) 96.3 F (35.7 C)    Resp 18    Ht '5\' 8"'  (1.727 m)    Wt 220 lb (99.8 kg)    SpO2 100%    BMI 33.45 kg/m  BMI: Estimated body mass index is 33.45 kg/m as calculated from the following:   Height as of this encounter: '5\' 8"'  (1.727 m).   Weight as of this encounter: 220 lb (99.8 kg). Ideal: Ideal body weight: 63.9 kg (140 lb 14 oz) Adjusted ideal body weight: 78.3 kg (172 lb 8.4 oz)  Cervical Spine Area Exam  Skin & Axial Inspection: No masses, redness, edema, swelling, or associated skin lesions Alignment: Symmetrical Functional ROM: Decreased ROM      Stability: No instability detected Muscle Tone/Strength: Functionally intact. No obvious neuro-muscular anomalies detected. Sensory (Neurological): Dermatomal pain pattern, left Palpation: No palpable anomalies              Upper Extremity (UE) Exam      Side: Right upper extremity   Side: Left upper extremity  Skin & Extremity Inspection: Skin color, temperature, and hair growth are WNL. No peripheral edema or cyanosis. No masses, redness, swelling, asymmetry, or associated skin lesions. No contractures.   Skin & Extremity Inspection: Skin color, temperature, and hair growth are WNL. No peripheral edema or cyanosis. No masses, redness, swelling, asymmetry, or associated skin lesions. No contractures.  Functional ROM: Unrestricted range of motion   Functional ROM: Unrestricted ROM          Muscle Tone/Strength: Functionally intact. No obvious neuro-muscular anomalies detected.   Muscle  Tone/Strength: Functionally intact. No obvious neuro-muscular anomalies detected.  Sensory (Neurological): Arthropathic arthralgia,      Sensory (Neurological): Dermatomal left        Palpation: No palpable anomalies               Palpation: No palpable anomalies              Provocative Test(s):  Phalen's test: deferred Tinel's test: deferred Apley's scratch test (touch opposite shoulder):  Action 1 (Across chest): Decreased ROM, improved after treatment Action 2 (Overhead): Decreased ROM, improved after treatment Action 3 (LB reach): Decreased ROM, improved after treatment     Provocative Test(s):  Phalen's test: deferred Tinel's test: deferred Apley's scratch test (touch opposite shoulder):  Action 1 (Across chest): deferred Action 2 (Overhead): deferred Action 3 (LB reach): deferred       Lumbar Spine  Area Exam  Skin & Axial Inspection: No masses, redness, or swelling Alignment: Symmetrical Functional ROM: Pain restricted ROM right greater than left       Stability: No instability detected Muscle Tone/Strength: Functionally intact. No obvious neuro-muscular anomalies detected. Sensory (Neurological): Dermatomal pain pattern on the right  Gait & Posture Assessment  Ambulation: Unassisted Gait: Relatively normal for age and body habitus Posture: WNL  Lower Extremity Exam      Side: Right lower extremity   Side: Left lower extremity  Stability: No instability observed           Stability: No instability observed          Skin & Extremity Inspection: Skin color, temperature, and hair growth are WNL. No peripheral edema or cyanosis. No masses, redness, swelling, asymmetry, or associated skin lesions. No contractures.   Skin & Extremity Inspection: Skin color, temperature, and hair growth are WNL. No peripheral edema or cyanosis. No masses, redness, swelling, asymmetry, or associated skin lesions. No contractures.  Functional ROM: Pain restricted ROM for hip and knee joints Limited  SLR (straight leg raise)   Functional ROM: Pain restricted ROM for hip and knee joints          Muscle Tone/Strength: Functionally intact. No obvious neuro-muscular anomalies detected.   Muscle Tone/Strength: Functionally intact. No obvious neuro-muscular anomalies detected.  Sensory (Neurological): Arthropathic pain pattern of knee       Sensory (Neurological): Arthropathic pain pattern of knee          DTR: Patellar: deferred today Achilles: deferred today Plantar: deferred today   DTR: Patellar: deferred today Achilles: deferred today Plantar: deferred today  Palpation: No palpable anomalies   Palpation: No palpable anomalies      Assessment   Status Diagnosis  Controlled Having a Flare-up Controlled 1. Cervical radicular pain (left)   2. Bilateral primary osteoarthritis of knee   3. Primary osteoarthritis of right shoulder   4. Lumbar facet arthropathy   5. Chronic pain syndrome   6. Lumbar spondylosis        Plan of Care    [name] has a current medication list which includes the following long-term medication(s): amitriptyline, gabapentin, and omeprazole.   1. Cervical radicular pain (left) - MR CERVICAL SPINE WO CONTRAST; Future  2. Bilateral primary osteoarthritis of knee - GENICULAR NERVE BLOCK; Future  3. Chronic pain syndrome - GENICULAR NERVE BLOCK; Future - HYDROcodone-acetaminophen (NORCO/VICODIN) 5-325 MG tablet; Take 1 tablet by mouth every 8 (eight) hours as needed for moderate pain.  Dispense: 90 tablet; Refill: 0 - HYDROcodone-acetaminophen (NORCO/VICODIN) 5-325 MG tablet; Take 1 tablet by mouth every 8 (eight) hours as needed for moderate pain.  Dispense: 90 tablet; Refill: 0 - HYDROcodone-acetaminophen (NORCO/VICODIN) 5-325 MG tablet; Take 1 tablet by mouth every 8 (eight) hours as needed for moderate pain.  Dispense: 90 tablet; Refill: 0     Pharmacotherapy (Medications Ordered): Meds ordered this encounter  Medications    HYDROcodone-acetaminophen (NORCO/VICODIN) 5-325 MG tablet    Sig: Take 1 tablet by mouth every 8 (eight) hours as needed for moderate pain.    Dispense:  90 tablet    Refill:  0   HYDROcodone-acetaminophen (NORCO/VICODIN) 5-325 MG tablet    Sig: Take 1 tablet by mouth every 8 (eight) hours as needed for moderate pain.    Dispense:  90 tablet    Refill:  0   HYDROcodone-acetaminophen (NORCO/VICODIN) 5-325 MG tablet    Sig: Take 1 tablet by mouth  every 8 (eight) hours as needed for moderate pain.    Dispense:  90 tablet    Refill:  0    Orders:  Orders Placed This Encounter  Procedures   GENICULAR NERVE BLOCK    Indication(s):  Sub-acute knee pain    Standing Status:   Future    Standing Expiration Date:   03/26/2022    Scheduling Instructions:     Side: Bilateral     Sedation: PO Valium     Timeframe: As soon as schedule allows    Order Specific Question:   Where will this procedure be performed?    Answer:   [facility]   MR CERVICAL SPINE WO CONTRAST    Patient presents with axial pain with possible radicular component. Please assist [place] in identifying specific level(s) and laterality of any additional findings such as: 1. Facet (Zygapophyseal) joint DJD (Hypertrophy, space narrowing, subchondral sclerosis, and/or osteophyte formation) 2. DDD and/or IVDD (Loss of disc height, desiccation, gas patterns, osteophytes, endplate sclerosis, or "Black disc disease") 3. Pars defects 4. Spondylolisthesis, spondylosis, and/or spondyloarthropathies (include Degree/Grade of displacement in mm) (stability) 5. Vertebral body Fractures (acute/chronic) (state percentage of collapse) 6. Demineralization (osteopenia/osteoporotic) 7. Bone pathology 8. Foraminal narrowing  9. Surgical changes 10. Central, Lateral Recess, and/or Foraminal Stenosis (include AP diameter of stenosis in mm) 11. Surgical changes (hardware type, status, and presence of fibrosis) 12. Modic Type Changes (MRI  only) 13. IVDD (Disc bulge, protrusion, herniation, extrusion) (Level, laterality, extent)    Standing Status:   Future    Standing Expiration Date:   01/26/2022    Scheduling Instructions:     Imaging must be done as soon as possible. Inform patient that order will expire within 30 days and I will not renew it.    Order Specific Question:   What is the patient's sedation requirement?    Answer:   No Sedation    Order Specific Question:   Does the patient have a pacemaker or implanted devices?    Answer:   No    Order Specific Question:   Preferred imaging location?    Answer:   [facility] (table limit-350lbs)    Order Specific Question:   Call Results- Best Contact Number?    Answer:   [phone] ([facility])    Order Specific Question:   Radiology Contrast Protocol - do NOT remove file path    Answer:   [email or web address]    Follow-up plan:   Return in about 8 days (around 01/03/2022) for B/L GNB with PO Valium.     s/p IA hyalgan #1 on 08/19/2019, #2 09/23/2019 not very effective unfortunately.  Lumbar MRI shows facet disease at L3, L4, L5 bilaterally.  Bilateral L3, L4, L5 medial branch nerve block #1; right glenohumeral shoulder joint injection (anterior approach) 10/19/2020, 03/13/2021            Recent Visits No visits were found meeting these conditions. Showing recent visits within past 90 days and meeting all other requirements Today's Visits Date Type Provider Dept  12/26/21 Office Visit Vanessa Santa, MD [facility]  Showing today's visits and meeting all other requirements Future Appointments Date Type Provider Dept  03/22/22 Appointment Vanessa Santa, MD [facility]  Showing future appointments within next 90 days and meeting all other requirements  I discussed the assessment and treatment plan with the patient. The patient was provided an opportunity to ask questions and all were answered. The patient agreed  with  the plan and demonstrated an understanding of the instructions.  Patient advised to call back or seek an in-person evaluation if the symptoms or condition worsens.  Duration of encounter: 31mnutes.  Note by: BGillis Santa MD Date: 12/26/2021; Time: 9:33 AM

## 2021-12-26 NOTE — Patient Instructions (Signed)

## 2021-12-26 NOTE — Progress Notes (Signed)
Nursing Pain Medication Assessment:  Safety precautions to be maintained throughout the outpatient stay will include: orient to surroundings, keep bed in low position, maintain call bell within reach at all times, provide assistance with transfer out of bed and ambulation.  Medication Inspection Compliance: Pill count conducted under aseptic conditions, in front of the patient. Neither the pills nor the bottle was removed from the patient's sight at any time. Once count was completed pills were immediately returned to the patient in their original bottle.  Medication: Hydrocodone/APAP Pill/Patch Count:  35 of 90 pills remain Pill/Patch Appearance: Markings consistent with prescribed medication Bottle Appearance: Standard pharmacy container. Clearly labeled. Filled Date: 72 / 19 / 2022 Last Medication intake:  Yesterday

## 2022-01-05 ENCOUNTER — Other Ambulatory Visit: Payer: Self-pay

## 2022-01-05 ENCOUNTER — Ambulatory Visit
Admission: RE | Admit: 2022-01-05 | Discharge: 2022-01-05 | Disposition: A | Discharge status: Home / Self Care | Payer: 59 | Source: Ambulatory Visit | Attending: Student in an Organized Health Care Education/Training Program | Admitting: Student in an Organized Health Care Education/Training Program

## 2022-01-05 DIAGNOSIS — M5412 Radiculopathy, cervical region: Secondary | ICD-10-CM | POA: Diagnosis not present

## 2022-01-10 ENCOUNTER — Telehealth: Payer: Self-pay | Admitting: Student in an Organized Health Care Education/Training Program

## 2022-01-10 ENCOUNTER — Other Ambulatory Visit: Payer: Self-pay | Admitting: Student in an Organized Health Care Education/Training Program

## 2022-01-10 DIAGNOSIS — M5412 Radiculopathy, cervical region: Secondary | ICD-10-CM

## 2022-01-10 NOTE — Telephone Encounter (Signed)
Patient says [name] called her and said Dr. Holley Raring offered to give her an epid on the day she is having knee injections. She wants to go ahead with this. We need an order put in and it will need authorization. Thank you

## 2022-01-10 NOTE — Progress Notes (Signed)
CLINICAL DATA:  Posterior neck pain for 1 month. No known injury.   EXAM: MRI CERVICAL SPINE WITHOUT CONTRAST   TECHNIQUE: Multiplanar, multisequence MR imaging of the cervical spine was performed. No intravenous contrast was administered.   COMPARISON:  None.   FINDINGS: Alignment: Anatomic   Vertebrae: No acute fracture, evidence of discitis, or bone lesion.   Cord: Normal signal and morphology.   Posterior Fossa, vertebral arteries, paraspinal tissues: Posterior fossa demonstrates no focal abnormality. Vertebral artery flow voids are maintained. Paraspinal soft tissues are unremarkable.   Disc levels:   Discs: Degenerative disease with disc height loss at C5-6 and C6-7. Disc desiccation throughout the cervical spine.   C2-3: No significant disc bulge. Moderate left facet arthropathy. No foraminal stenosis. No spinal stenosis.   C3-4: Broad-based disc bulge. Moderate left facet arthropathy. Mild right facet arthropathy. Severe left foraminal stenosis. Mild right foraminal stenosis. No spinal stenosis.   C4-5: No significant disc bulge. Moderate right and mild left facet arthropathy. Moderate bilateral foraminal stenosis.   C5-6: Broad-based disc osteophyte complex. Bilateral uncovertebral degenerative changes. Moderate-severe bilateral foraminal stenosis. Mild spinal stenosis.   C6-7: Broad-based disc osteophyte complex. Left uncovertebral degenerative changes. Moderate-severe left foraminal stenosis. Mild right foraminal stenosis. Moderate spinal stenosis.   C7-T1: No significant disc bulge. No neural foraminal stenosis. No central canal stenosis.   IMPRESSION: 1. At C3-4 there is a broad-based disc bulge. Moderate left facet arthropathy. Mild right facet arthropathy. Severe left foraminal stenosis. Mild right foraminal stenosis. 2. At C4-5 there is moderate right and mild left facet arthropathy. Moderate bilateral foraminal stenosis. 3. At C5-6 there is a  broad-based disc osteophyte complex. Bilateral uncovertebral degenerative changes. Moderate-severe bilateral foraminal stenosis. Mild spinal stenosis. 4. At C6-7 there is a broad-based disc osteophyte complex. Left uncovertebral degenerative changes. Moderate-severe left foraminal stenosis. Mild right foraminal stenosis. Moderate spinal stenosis. 5.  No acute osseous injury of the cervical spine.     Electronically Signed   By: Kathreen Devoid M.D.   On: 01/06/2022 07:40   Recommend cervical epidural steroid injection for multilevel disc herniations with moderate to severe bilateral cervical foraminal stenosis at C4-C5, C5-C6, C6-C7.  Orders Placed This Encounter  Procedures   Cervical Epidural Injection    Sedation: Patient's choice. Purpose: Diagnostic/Therapeutic Indication(s): Radiculitis and cervicalgia associater with cervical degenerative disc disease.    Standing Status:   Future    Standing Expiration Date:   04/10/2022    Scheduling Instructions:     Procedure: Cervical Epidural Steroid Injection/Block     Level(s): C7-T1     Laterality: LEFT     Timeframe: As soon as schedule allows    Order Specific Question:   Where will this procedure be performed?    Answer:   [facility]    Comments:   Holley Raring

## 2022-01-10 NOTE — Telephone Encounter (Signed)
Have not heard from Dr. Holley Raring after bubble sent.

## 2022-01-10 NOTE — Telephone Encounter (Signed)
Bubble sent to Dr. Holley Raring.

## 2022-01-17 ENCOUNTER — Ambulatory Visit (HOSPITAL_BASED_OUTPATIENT_CLINIC_OR_DEPARTMENT_OTHER): Payer: 59 | Admitting: Student in an Organized Health Care Education/Training Program

## 2022-01-17 ENCOUNTER — Encounter: Payer: Self-pay | Admitting: Student in an Organized Health Care Education/Training Program

## 2022-01-17 ENCOUNTER — Other Ambulatory Visit: Payer: Self-pay

## 2022-01-17 ENCOUNTER — Ambulatory Visit
Admission: RE | Admit: 2022-01-17 | Discharge: 2022-01-17 | Disposition: A | Discharge status: Home / Self Care | Payer: 59 | Source: Ambulatory Visit | Attending: Student in an Organized Health Care Education/Training Program | Admitting: Student in an Organized Health Care Education/Training Program

## 2022-01-17 VITALS — BP 121/85 | HR 95 | Temp 97.4°F | Resp 13 | Ht 68.0 in | Wt 200.0 lb

## 2022-01-17 DIAGNOSIS — G894 Chronic pain syndrome: Secondary | ICD-10-CM | POA: Insufficient documentation

## 2022-01-17 DIAGNOSIS — M5412 Radiculopathy, cervical region: Secondary | ICD-10-CM | POA: Insufficient documentation

## 2022-01-17 DIAGNOSIS — M170 Bilateral primary osteoarthritis of knee: Secondary | ICD-10-CM

## 2022-01-17 MED ORDER — ROPIVACAINE HCL 2 MG/ML IJ SOLN
INTRAMUSCULAR | Status: AC
  Filled 2022-01-17: qty 20

## 2022-01-17 MED ORDER — DEXAMETHASONE SODIUM PHOSPHATE 10 MG/ML IJ SOLN
INTRAMUSCULAR | Status: AC
  Filled 2022-01-17: qty 1

## 2022-01-17 MED ORDER — LIDOCAINE HCL 2 % IJ SOLN
20.0000 mL | Freq: Once | INTRAMUSCULAR | Status: AC
  Administered 2022-01-17: 400 mg

## 2022-01-17 MED ORDER — DIAZEPAM 5 MG PO TABS
ORAL_TABLET | ORAL | Status: AC
  Filled 2022-01-17: qty 1

## 2022-01-17 MED ORDER — ROPIVACAINE HCL 2 MG/ML IJ SOLN
9.0000 mL | Freq: Once | INTRAMUSCULAR | Status: AC
  Administered 2022-01-17: 9 mL via PERINEURAL

## 2022-01-17 MED ORDER — DEXAMETHASONE SODIUM PHOSPHATE 10 MG/ML IJ SOLN
20.0000 mg | Freq: Once | INTRAMUSCULAR | Status: AC
  Administered 2022-01-17: 10 mg
  Filled 2022-01-17: qty 2

## 2022-01-17 MED ORDER — DEXAMETHASONE SODIUM PHOSPHATE 10 MG/ML IJ SOLN
10.0000 mg | Freq: Once | INTRAMUSCULAR | Status: AC
  Administered 2022-01-17: 10 mg

## 2022-01-17 MED ORDER — DEXAMETHASONE SODIUM PHOSPHATE 10 MG/ML IJ SOLN
10.0000 mg | Freq: Once | INTRAMUSCULAR | Status: AC
  Administered 2022-01-17: 10 mg
  Filled 2022-01-17: qty 1

## 2022-01-17 MED ORDER — SODIUM CHLORIDE (PF) 0.9 % IJ SOLN
INTRAMUSCULAR | Status: AC
  Filled 2022-01-17: qty 10

## 2022-01-17 MED ORDER — LIDOCAINE HCL 2 % IJ SOLN
INTRAMUSCULAR | Status: AC
  Filled 2022-01-17: qty 20

## 2022-01-17 MED ORDER — DIAZEPAM 5 MG PO TABS
5.0000 mg | ORAL_TABLET | ORAL | Status: AC
  Administered 2022-01-17: 5 mg via ORAL

## 2022-01-17 MED ORDER — IOHEXOL 180 MG/ML  SOLN
10.0000 mL | Freq: Once | INTRAMUSCULAR | Status: AC
  Administered 2022-01-17: 5 mL via EPIDURAL

## 2022-01-17 NOTE — Progress Notes (Signed)
PROVIDER NOTE: Interpretation of information contained herein should be left to medically-trained personnel. Specific patient instructions are provided elsewhere under "Patient Instructions" section of medical record. This document was created in part using STT-dictation technology, any transcriptional errors that may result from this process are unintentional.  Patient: [name] Type: Established DOB: Apr 22, 1961 MRN: [id number] PCP: Marval Regal, NP  Service: Procedure DOS: 01/17/2022 Setting: Ambulatory Location: Ambulatory outpatient facility Delivery: Face-to-face Provider: Gillis Santa, MD Specialty: Interventional Pain Management Specialty designation: 09 Location: Outpatient facility Ref. Prov.: Gillis Santa, MD   Procedure [facility] Interventional Pain Management )   Type: Cervical Epidural Steroid injection (ESI) (Interlaminar) #1  Laterality: Left  Level: C7-T1 Imaging: Fluoroscopy-assisted DOS: 01/17/2022  Performed by: Gillis Santa, MD Anesthesia: Local anesthesia (1-2% Lidocaine) Anxiolysis: Oral Valium 5 mg Sedation: None.   Purpose: Diagnostic/Therapeutic Indications: Cervicalgia, cervical radicular pain, degenerative disc disease, severe enough to impact quality of life or function. 1. Cervical radicular pain (left)   2. Bilateral primary osteoarthritis of knee   3. Chronic pain syndrome    NAS-11 score:   Pre-procedure: 8 /10   Post-procedure: 0-No pain (3 - 4 neck a little stiff)/10     C-MRI CLINICAL DATA:  Posterior neck pain for 1 month. No known injury.   EXAM: MRI CERVICAL SPINE WITHOUT CONTRAST   TECHNIQUE: Multiplanar, multisequence MR imaging of the cervical spine was performed. No intravenous contrast was administered.   COMPARISON:  None.   FINDINGS: Alignment: Anatomic   Vertebrae: No acute fracture, evidence of discitis, or bone lesion.   Cord: Normal signal and morphology.   Posterior Fossa, vertebral arteries, paraspinal  tissues: Posterior fossa demonstrates no focal abnormality. Vertebral artery flow voids are maintained. Paraspinal soft tissues are unremarkable.   Disc levels:   Discs: Degenerative disease with disc height loss at C5-6 and C6-7. Disc desiccation throughout the cervical spine.   C2-3: No significant disc bulge. Moderate left facet arthropathy. No foraminal stenosis. No spinal stenosis.   C3-4: Broad-based disc bulge. Moderate left facet arthropathy. Mild right facet arthropathy. Severe left foraminal stenosis. Mild right foraminal stenosis. No spinal stenosis.   C4-5: No significant disc bulge. Moderate right and mild left facet arthropathy. Moderate bilateral foraminal stenosis.   C5-6: Broad-based disc osteophyte complex. Bilateral uncovertebral degenerative changes. Moderate-severe bilateral foraminal stenosis. Mild spinal stenosis.   C6-7: Broad-based disc osteophyte complex. Left uncovertebral degenerative changes. Moderate-severe left foraminal stenosis. Mild right foraminal stenosis. Moderate spinal stenosis.   C7-T1: No significant disc bulge. No neural foraminal stenosis. No central canal stenosis.   IMPRESSION: 1. At C3-4 there is a broad-based disc bulge. Moderate left facet arthropathy. Mild right facet arthropathy. Severe left foraminal stenosis. Mild right foraminal stenosis. 2. At C4-5 there is moderate right and mild left facet arthropathy. Moderate bilateral foraminal stenosis. 3. At C5-6 there is a broad-based disc osteophyte complex. Bilateral uncovertebral degenerative changes. Moderate-severe bilateral foraminal stenosis. Mild spinal stenosis. 4. At C6-7 there is a broad-based disc osteophyte complex. Left uncovertebral degenerative changes. Moderate-severe left foraminal stenosis. Mild right foraminal stenosis. Moderate spinal stenosis. 5.  No acute osseous injury of the cervical spine.  Pre-Procedure Preparation  Monitoring: As per clinic protocol.  Respiration, ETCO2, SpO2, BP, heart rate and rhythm monitor placed and checked for adequate function  Risk Assessment: Vitals:  LDJ:[id number] body mass index is 30.41 kg/m as calculated from the following:   Height as of this encounter: 5\' 8"  (1.727 m).   Weight as of this encounter: 200 lb (  90.7 kg)., Rate:95ECG Heart Rate: 78, BP:(!) 147/105, Resp:18, Temp:(!) 97.4 F (36.3 C), SpO2:99 %  Allergies: She has No Known Allergies.  Precautions: None required  Blood-thinner(s): None at this time  Coagulopathies: Reviewed. None identified.   Active Infection(s): Reviewed. None identified. [name] is afebrile   Location setting: Procedure suite Position: Prone, on modified reverse trendelenburg to facilitate breathing, with head in head-cradle. Pillows positioned under chest (below chin-level) with cervical spine flexed. Safety Precautions: Patient was assessed for positional comfort and pressure points before starting the procedure. Prepping solution: DuraPrep (Iodine Povacrylex [0.7% available iodine] and Isopropyl Alcohol, 74% w/w) Prep Area: Entire  cervicothoracic region Approach: percutaneous, paramedial Intended target: Posterior cervical epidural space Materials: Tray: Epidural Needle(s): Epidural (Tuohy) Qty: 1 Length: (35mm) 3.5-inch Gauge: 22G  4 cc solution made of 2 cc of preservative-free saline, 1 cc of 0.2% ropivacaine, 1 cc of Decadron 10 mg/cc.    Meds ordered this encounter  Medications   iohexol (OMNIPAQUE) 180 MG/ML injection 10 mL    Must be Myelogram-compatible. If not available, you may substitute with a water-soluble, non-ionic, hypoallergenic, myelogram-compatible radiological contrast medium.   lidocaine (XYLOCAINE) 2 % (with pres) injection 400 mg   diazepam (VALIUM) tablet 5 mg    Make sure Flumazenil is available in the pyxis when using this medication. If oversedation occurs, administer 0.2 mg IV over 15 sec. If after 45 sec no response, administer 0.2  mg again over 1 min; may repeat at 1 min intervals; not to exceed 4 doses (1 mg)   ropivacaine (PF) 2 mg/mL (0.2%) (NAROPIN) injection 9 mL   ropivacaine (PF) 2 mg/mL (0.2%) (NAROPIN) injection 9 mL   dexamethasone (DECADRON) injection 10 mg   dexamethasone (DECADRON) injection 20 mg   dexamethasone (DECADRON) injection 10 mg    Orders Placed This Encounter  Procedures   DG PAIN CLINIC C-ARM 1-60 MIN NO REPORT    Intraoperative interpretation by procedural physician at [place].    Standing Status:   Standing    Number of Occurrences:   1    Order Specific Question:   Reason for exam:    Answer:   Assistance in needle guidance and placement for procedures requiring needle placement in or near specific anatomical locations not easily accessible without such assistance.   Ambulatory referral to Physical Therapy    Referral Priority:   Routine    Referral Type:   Physical Medicine    Referral Reason:   Specialty Services Required    Requested Specialty:   Physical Therapy    Number of Visits Requested:   1     Time-out: 4665 I initiated and conducted the "Time-out" before starting the procedure, as per protocol. The patient was asked to participate by confirming the accuracy of the "Time Out" information. Verification of the correct person, site, and procedure were performed and confirmed by me, the nursing staff, and the patient. "Time-out" conducted as per Joint Commission's Universal Protocol (UP.01.01.01). Procedure checklist: Completed   H&P (Pre-op  Assessment)  [name] is a 61 y.o. (year old), female patient, seen today for interventional treatment. She  has a past surgical history that includes Laparoscopic hysterectomy (10/23/2010); Midurethral sling (05/02/2011); and excision of vulvar lesion (05/02/2011). [name] has a current medication list which includes the following prescription(s): amitriptyline, gabapentin, hydrocodone-acetaminophen, [START ON 02/02/2022]  hydrocodone-acetaminophen, [START ON 03/04/2022] hydrocodone-acetaminophen, melatonin, multivitamin, omeprazole, and valacyclovir. Her primarily concern today is the Neck Pain and Knee Pain (bilateral)  She has No Known Allergies.   Last encounter: My last encounter with her was on 01/10/2022. Pertinent problems: [name] has Obesity; Arthritis of wrist; Chronic pain syndrome; Lumbar facet arthropathy; Bilateral primary osteoarthritis of knee; Bilateral hip pain; Chronic SI joint pain; Lumbar radiculopathy; Long-term current use of opiate analgesic; Right rotator cuff tear arthropathy; Chronic right shoulder pain; Fibromyalgia; and Cervical radicular pain (left) on their pertinent problem list. Pain Assessment: Severity of Chronic pain is reported as a 8 /10. Location: Knee Right, Left/denies. Onset: More than a month ago. Quality: Aching, Shooting. Timing: Intermittent. Modifying factor(s): knee brace, non weight bearing, elevation of legs. Vitals:  height is 5\' 8"  (1.727 m) and weight is 200 lb (90.7 kg). Her temporal temperature is 97.4 F (36.3 C) (abnormal). Her blood pressure is 121/85 and her pulse is 95. Her respiration is 13 and oxygen saturation is 97%.   Reason for encounter: Interventional pain management therapy due pain of at least four (4) weeks in duration, with to failure to respond to and/or inability to tolerate more conservative care.   Site Confirmation: [name] was asked to confirm the procedure and laterality before marking the site.  Consent: Before the procedure and under the influence of no sedative(s), amnesic(s), or anxiolytics, the patient was informed of the treatment options, risks and possible complications. To fulfill our ethical and legal obligations, as recommended by the American Medical Association's Code of Ethics, I have informed the patient of my clinical impression; the nature and purpose of the treatment or procedure; the risks, benefits, and possible  complications of the intervention; the alternatives, including doing nothing; the risk(s) and benefit(s) of the alternative treatment(s) or procedure(s); and the risk(s) and benefit(s) of doing nothing. The patient was provided information about the general risks and possible complications associated with the procedure. These may include, but are not limited to: failure to achieve desired goals, infection, bleeding, organ or nerve damage, allergic reactions, paralysis, and death. In addition, the patient was informed of those risks and complications associated to Spine-related procedures, such as failure to decrease pain; infection (i.e.: Meningitis, epidural or intraspinal abscess); bleeding (i.e.: epidural hematoma, subarachnoid hemorrhage, or any other type of intraspinal or peri-dural bleeding); organ or nerve damage (i.e.: Any type of peripheral nerve, nerve root, or spinal cord injury) with subsequent damage to sensory, motor, and/or autonomic systems, resulting in permanent pain, numbness, and/or weakness of one or several areas of the body; allergic reactions; (i.e.: anaphylactic reaction); and/or death. Furthermore, the patient was informed of those risks and complications associated with the medications. These include, but are not limited to: allergic reactions (i.e.: anaphylactic or anaphylactoid reaction(s)); adrenal axis suppression; blood sugar elevation that in diabetics may result in ketoacidosis or comma; water retention that in patients with history of congestive heart failure may result in shortness of breath, pulmonary edema, and decompensation with resultant heart failure; weight gain; swelling or edema; medication-induced neural toxicity; particulate matter embolism and blood vessel occlusion with resultant organ, and/or nervous system infarction; and/or aseptic necrosis of one or more joints. Finally, the patient was informed that Medicine is not an exact science; therefore, there is also  the possibility of unforeseen or unpredictable risks and/or possible complications that may result in a catastrophic outcome. The patient indicated having understood very clearly. We have given the patient no guarantees and we have made no promises. Enough time was given to the patient to ask questions, all of which were answered to the patient's satisfaction. [name]  has indicated that she wanted to continue with the procedure. Attestation: I, the ordering provider, attest that I have discussed with the patient the benefits, risks, side-effects, alternatives, likelihood of achieving goals, and potential problems during recovery for the procedure that I have provided informed consent.  Date   Time: 01/17/2022 12:44 PM   Description of procedure   Start Time: 1352 hrs  Local Anesthesia: Once the patient was positioned, prepped, and time-out was completed. The target area was identified located. The skin was marked with an approved surgical skin marker. Once marked, the skin (epidermis, dermis, and hypodermis), and deeper tissues (fat, connective tissue and muscle) were infiltrated with a small amount of a short-acting local anesthetic, loaded on a 10cc syringe with a 25G, 1.5-in  Needle. An appropriate amount of time was allowed for local anesthetics to take effect before proceeding to the next step. Local Anesthetic: Lidocaine 1-2% The unused portion of the local anesthetic was discarded in the proper designated containers. Safety Precautions: Aspiration looking for blood return was conducted prior to all injections. At no point did I inject any substances, as a needle was being advanced. Before injecting, the patient was told to immediately notify me if she was experiencing any new onset of "ringing in the ears, or metallic taste in the mouth". No attempts were made at seeking any paresthesias. Safe injection practices and needle disposal techniques used. Medications properly checked for expiration  dates. SDV (single dose vial) medications used. After the completion of the procedure, all disposable equipment used was discarded in the proper designated medical waste containers.  Technical description: Protocol guidelines were followed. Using fluoroscopic guidance, the epidural needle was introduced through the skin, ipsilateral to the reported pain, and advanced to the target area. Posterior laminar os was contacted and the needle walked caudad, until the lamina was cleared. The ligamentum flavum was engaged and the epidural space identified using loss-of-resistance technique with 2-3 ml of PF-NaCl (0.9% NSS), in a 5cc dedicated LOR syringe. See "Imaging guidance" below for use of contrast details.  Injection: Once satisfactory needle placement was confirmed, I proceeded to inject the desired solution in slow, incremental fashion, intermittently assessing for discomfort or any signs of abnormal or undesired spread of substance. Once completed, the needle was removed and disposed of, as per hospital protocols.   Vitals:   01/17/22 1353 01/17/22 1356 01/17/22 1401 01/17/22 1406  BP: 133/78 137/82 124/90 121/85  Pulse:      Resp: 17 13 17 13   Temp:      TempSrc:      SpO2: 98% 99% 100% 97%  Weight:      Height:        End Time: 1405 hrs  Once the entire procedure was completed, the treated area was cleaned, making sure to leave some of the prepping solution back to take advantage of its long term bactericidal properties.   Imaging guidance  Type of Imaging Technique: Fluoroscopy Guidance (Spinal) Indication(s): Assistance in needle guidance and placement for procedures requiring needle placement in or near specific anatomical locations not easily accessible without such assistance. Exposure Time: Please see nurses notes for exact fluoroscopy time. Contrast: Before injecting any contrast, we confirmed that the patient did not have an allergy to iodine, shellfish, or radiological  contrast. Once satisfactory needle placement was completed, radiological contrast was injected under continuous fluoroscopic guidance. Injection of contrast accomplished without complications. See chart for type and volume of contrast used. Fluoroscopic Guidance: I was personally present in  the fluoroscopy suite, where the patient was placed in position for the procedure, over the fluoroscopy-compatible table. Fluoroscopy was manipulated, using "Tunnel Vision Technique", to obtain the best possible view of the target area, on the affected side. Parallax error was corrected before commencing the procedure. A "direction-depth-direction" technique was used to introduce the needle under continuous pulsed fluoroscopic guidance. Once the target was reached, antero-posterior, oblique, and lateral fluoroscopic projection views were taken to confirm needle placement in all planes. Electronic images uploaded into EMR.  Interpretation: Successful epidural injection. Intraoperative imaging interpretation by performing Physician.    Post-op assessment  Post-procedure Vital Signs:  Pulse/HCG Rate: 9571 Temp: (!) 97.4 F (36.3 C) Resp: 13 BP: 121/85 SpO2: 97 %  EBL: None  Complications: No immediate post-treatment complications observed by team, or reported by patient.  Note: The patient tolerated the entire procedure well. A repeat set of vitals were taken after the procedure and the patient was kept under observation following institutional policy, for this type of procedure. Post-procedural neurological assessment was performed, showing return to baseline, prior to discharge. The patient was provided with post-procedure discharge instructions, including a section on how to identify potential problems. Should any problems arise concerning this procedure, the patient was given instructions to immediately contact us, at any time, without hesitation. In any case, we plan to contact the patient by telephone for a  follow-up status report regarding this interventional procedure.  Comments:  No additional relevant information.   Plan of care  Chronic Opioid Analgesic:  hydrocodone 5 mg every 8 hours as needed, quantity 90/month    Medications administered: We administered iohexol, lidocaine, diazepam, ropivacaine (PF) 2 mg/mL (0.2%), ropivacaine (PF) 2 mg/mL (0.2%), dexamethasone, dexamethasone, and dexamethasone.  Follow-up plan:   Return in about 3 weeks (around 02/07/2022) for Post Procedure Evaluation, virtual.      s/p IA hyalgan #1 on 08/19/2019, #2 09/23/2019 not very effective unfortunately.  Lumbar MRI shows facet disease at L3, L4, L5 bilaterally.  Bilateral L3, L4, L5 medial branch nerve block #1; right glenohumeral shoulder joint injection (anterior approach) 10/19/2020, 03/13/2021             Recent Visits Date Type Provider Dept  12/26/21 Office Visit Gillis Santa, MD [facility]  Showing recent visits within past 90 days and meeting all other requirements Today's Visits Date Type Provider Dept  01/17/22 Procedure visit Gillis Santa, MD [facility]  Showing today's visits and meeting all other requirements Future Appointments Date Type Provider Dept  02/07/22 Appointment Gillis Santa, MD [facility]  03/22/22 Appointment Gillis Santa, MD [facility]  Showing future appointments within next 90 days and meeting all other requirements   Disposition: Discharge home  Discharge (Date   Time): 01/17/2022; 1413 hrs.   Primary Care Physician: Marval Regal, NP Location: [facility] Note by: Gillis Santa, MD Date: 01/17/2022; Time: 3:10 PM  DISCLAIMER: Medicine is not an exact science. It has no guarantees or warranties. The decision to proceed with this intervention was based on the information collected from the patient. Conclusions were drawn from the patient's questionnaire, interview, and examination. Because  information was provided in large part by the patient, it cannot be guaranteed that it has not been purposely or unconsciously manipulated or altered. Every effort has been made to obtain as much accurate, relevant, available data as possible. Always take into account that the treatment will also be dependent on availability of resources and existing treatment  guidelines, considered by other Pain Management Specialists as being common knowledge and practice, at the time of the intervention. It is also important to point out that variation in procedural techniques and pharmacological choices are the acceptable norm. For Medico-Legal review purposes, the indications, contraindications, technique, and results of the these procedures should only be evaluated, judged and interpreted by a Board-Certified Interventional Pain Specialist with extensive familiarity and expertise in the same exact procedure and technique.

## 2022-01-17 NOTE — Patient Instructions (Signed)

## 2022-01-17 NOTE — Progress Notes (Signed)
PROVIDER NOTE: Interpretation of information contained herein should be left to medically-trained personnel. Specific patient instructions are provided elsewhere under "Patient Instructions" section of medical record. This document was created in part using STT-dictation technology, any transcriptional errors that may result from this process are unintentional.  Patient: [name] Type: Established DOB: 05-29-1961 MRN: [id number] PCP: Marval Regal, NP  Service: Procedure DOS: 01/17/2022 Setting: Ambulatory Location: Ambulatory outpatient facility Delivery: Face-to-face Provider: Gillis Santa, MD Specialty: Interventional Pain Management Specialty designation: 09 Location: Outpatient facility Ref. Prov.: Gillis Santa, MD    Primary Reason for Visit: Interventional Pain Management Treatment. CC: Neck Pain and Knee Pain (bilateral)    Procedure:          Anesthesia, Analgesia, Anxiolysis:  Type: Genicular Nerves Block (Superolateral, Superomedial, and Inferomedial Genicular Nerves) #1  CPT: 84696      Primary Purpose: Diagnostic Region: Lateral, Anterior, and Medial aspects of the knee joint, above and below the knee joint proper. Level: Superior and inferior to the knee joint. Target Area: For Genicular Nerve block(s), the targets are: the superolateral genicular nerve, located in the lateral distal portion of the femoral shaft as it curves to form the lateral epicondyle, in the region of the distal femoral metaphysis; the superomedial genicular nerve, located in the medial distal portion of the femoral shaft as it curves to form the medial epicondyle; and the inferomedial genicular nerve, located in the medial, proximal portion of the tibial shaft, as it curves to form the medial epicondyle, in the region of the proximal tibial metaphysis. Approach: Anterior, percutaneous, ipsilateral approach. Laterality: Bilateral  Anesthesia: Local (1-2% Lidocaine)  Anxiolysis: Oral 5mg  PO  Valium Sedation: Minimal  Guidance: Fluoroscopy           Position: Modified Fowler's position with pillows under the targeted knee(s).   B/L Knee OA  NAS-11 Pain score:   Pre-procedure: 8 /10   Post-procedure: 0-No pain (3 - 4 neck a little stiff)/10     Pre-op H&P Assessment:  [name] is a 61 y.o. (year old), female patient, seen today for interventional treatment. She  has a past surgical history that includes Laparoscopic hysterectomy (10/23/2010); Midurethral sling (05/02/2011); and excision of vulvar lesion (05/02/2011). [name] has a current medication list which includes the following prescription(s): amitriptyline, gabapentin, hydrocodone-acetaminophen, [START ON 02/02/2022] hydrocodone-acetaminophen, [START ON 03/04/2022] hydrocodone-acetaminophen, melatonin, multivitamin, omeprazole, and valacyclovir. Her primarily concern today is the Neck Pain and Knee Pain (bilateral)  Initial Vital Signs:  Pulse/HCG Rate: 95ECG Heart Rate: 78 Temp: (!) 97.4 F (36.3 C) Resp: 18 BP: (!) 147/105 SpO2: 99 %  BMI: Estimated body mass index is 30.41 kg/m as calculated from the following:   Height as of this encounter: 5\' 8"  (1.727 m).   Weight as of this encounter: 200 lb (90.7 kg).  Risk Assessment: Allergies: Reviewed. She has No Known Allergies.  Allergy Precautions: None required Coagulopathies: Reviewed. None identified.  Blood-thinner therapy: None at this time Active Infection(s): Reviewed. None identified. [name] is afebrile  Site Confirmation: [name] was asked to confirm the procedure and laterality before marking the site Procedure checklist: Completed Consent: Before the procedure and under the influence of no sedative(s), amnesic(s), or anxiolytics, the patient was informed of the treatment options, risks and possible complications. To fulfill our ethical and legal obligations, as recommended by the American Medical Association's Code of Ethics, I have informed the  patient of my clinical impression; the nature and purpose of the treatment or procedure;  the risks, benefits, and possible complications of the intervention; the alternatives, including doing nothing; the risk(s) and benefit(s) of the alternative treatment(s) or procedure(s); and the risk(s) and benefit(s) of doing nothing. The patient was provided information about the general risks and possible complications associated with the procedure. These may include, but are not limited to: failure to achieve desired goals, infection, bleeding, organ or nerve damage, allergic reactions, paralysis, and death. In addition, the patient was informed of those risks and complications associated to the procedure, such as failure to decrease pain; infection; bleeding; organ or nerve damage with subsequent damage to sensory, motor, and/or autonomic systems, resulting in permanent pain, numbness, and/or weakness of one or several areas of the body; allergic reactions; (i.e.: anaphylactic reaction); and/or death. Furthermore, the patient was informed of those risks and complications associated with the medications. These include, but are not limited to: allergic reactions (i.e.: anaphylactic or anaphylactoid reaction(s)); adrenal axis suppression; blood sugar elevation that in diabetics may result in ketoacidosis or comma; water retention that in patients with history of congestive heart failure may result in shortness of breath, pulmonary edema, and decompensation with resultant heart failure; weight gain; swelling or edema; medication-induced neural toxicity; particulate matter embolism and blood vessel occlusion with resultant organ, and/or nervous system infarction; and/or aseptic necrosis of one or more joints. Finally, the patient was informed that Medicine is not an exact science; therefore, there is also the possibility of unforeseen or unpredictable risks and/or possible complications that may result in a catastrophic  outcome. The patient indicated having understood very clearly. We have given the patient no guarantees and we have made no promises. Enough time was given to the patient to ask questions, all of which were answered to the patient's satisfaction. Ms. Cunning has indicated that she wanted to continue with the procedure. Attestation: I, the ordering provider, attest that I have discussed with the patient the benefits, risks, side-effects, alternatives, likelihood of achieving goals, and potential problems during recovery for the procedure that I have provided informed consent. Date   Time: 01/17/2022 12:44 PM  Pre-Procedure Preparation:  Monitoring: As per clinic protocol. Respiration, ETCO2, SpO2, BP, heart rate and rhythm monitor placed and checked for adequate function Safety Precautions: Patient was assessed for positional comfort and pressure points before starting the procedure. Time-out: I initiated and conducted the "Time-out" before starting the procedure, as per protocol. The patient was asked to participate by confirming the accuracy of the "Time Out" information. Verification of the correct person, site, and procedure were performed and confirmed by me, the nursing staff, and the patient. "Time-out" conducted as per Joint Commission's Universal Protocol (UP.01.01.01). Time: 1352  Description of Procedure:          Area Prepped: Entire knee area, from mid-thigh to mid-shin, lateral, anterior, and medial aspects. DuraPrep (Iodine Povacrylex [0.7% available iodine] and Isopropyl Alcohol, 74% w/w) Safety Precautions: Aspiration looking for blood return was conducted prior to all injections. At no point did we inject any substances, as a needle was being advanced. No attempts were made at seeking any paresthesias. Safe injection practices and needle disposal techniques used. Medications properly checked for expiration dates. SDV (single dose vial) medications used. Description of the Procedure:  Protocol guidelines were followed. The patient was placed in position over the procedure table. The target area was identified and the area prepped in the usual manner. Skin & deeper tissues infiltrated with local anesthetic. Appropriate amount of time allowed to pass for local anesthetics to take  effect. The procedure needles were then advanced to the target area. Proper needle placement secured. Negative aspiration confirmed. Solution injected in intermittent fashion, asking for systemic symptoms every 0.5cc of injectate. The needles were then removed and the area cleansed, making sure to leave some of the prepping solution back to take advantage of its long term bactericidal properties.      Vitals:   01/17/22 1353 01/17/22 1356 01/17/22 1401 01/17/22 1406  BP: 133/78 137/82 124/90 121/85  Pulse:      Resp: 17 13 17 13   Temp:      TempSrc:      SpO2: 98% 99% 100% 97%  Weight:      Height:        Start Time: 1352 hrs. End Time: 1405 hrs. Materials:  Needle(s) Type: Spinal Needle Gauge: 22G Length: 3.5-in Medication(s): Please see orders for medications and dosing details. 6 cc solution made of 5 cc of 0.2% ropivacaine, 1 cc of Decadron 10 mg/cc.  2 cc injected at each level above for the left knee 6 cc solution made of 5 cc of 0.2% ropivacaine, 1 cc of Decadron 10 mg/cc.  2 cc injected at each level above for the right knee  Imaging Guidance (Non-Spinal):          Type of Imaging Technique: Fluoroscopy Guidance (Non-Spinal) Indication(s): Assistance in needle guidance and placement for procedures requiring needle placement in or near specific anatomical locations not easily accessible without such assistance. Exposure Time: Please see nurses notes. Contrast: None used. Fluoroscopic Guidance: I was personally present during the use of fluoroscopy. "Tunnel Vision Technique" used to obtain the best possible view of the target area. Parallax error corrected before commencing the  procedure. "Direction-depth-direction" technique used to introduce the needle under continuous pulsed fluoroscopy. Once target was reached, antero-posterior, oblique, and lateral fluoroscopic projection used confirm needle placement in all planes. Images permanently stored in EMR. Interpretation: No contrast injected. I personally interpreted the imaging intraoperatively. Adequate needle placement confirmed in multiple planes. Permanent images saved into the patient's record.   Post-operative Assessment:  Post-procedure Vital Signs:  Pulse/HCG Rate: 9571 Temp:  (!) 97.4 F (36.3 C) Resp: 13 BP: 121/85 SpO2: 97 %  EBL: None  Complications: No immediate post-treatment complications observed by team, or reported by patient.  Note: The patient tolerated the entire procedure well. A repeat set of vitals were taken after the procedure and the patient was kept under observation following institutional policy, for this type of procedure. Post-procedural neurological assessment was performed, showing return to baseline, prior to discharge. The patient was provided with post-procedure discharge instructions, including a section on how to identify potential problems. Should any problems arise concerning this procedure, the patient was given instructions to immediately contact us, at any time, without hesitation. In any case, we plan to contact the patient by telephone for a follow-up status report regarding this interventional procedure.  Comments:  No additional relevant information.  Plan of Care  Orders:  Orders Placed This Encounter  Procedures   DG PAIN [facility] C-ARM 1-60 MIN NO REPORT    Intraoperative interpretation by procedural physician at [place].    Standing Status:   Standing    Number of Occurrences:   1    Order Specific Question:   Reason for exam:    Answer:   Assistance in needle guidance and placement for procedures requiring needle placement in or near specific  anatomical locations not easily accessible without such assistance.  Ambulatory referral to Physical Therapy    Referral Priority:   Routine    Referral Type:   Physical Medicine    Referral Reason:   Specialty Services Required    Requested Specialty:   Physical Therapy    Number of Visits Requested:   1   Chronic Opioid Analgesic:  hydrocodone 5 mg every 8 hours as needed, quantity 90/month    Medications ordered for procedure: Meds ordered this encounter  Medications   iohexol (OMNIPAQUE) 180 MG/ML injection 10 mL    Must be Myelogram-compatible. If not available, you may substitute with a water-soluble, non-ionic, hypoallergenic, myelogram-compatible radiological contrast medium.   lidocaine (XYLOCAINE) 2 % (with pres) injection 400 mg   diazepam (VALIUM) tablet 5 mg    Make sure Flumazenil is available in the pyxis when using this medication. If oversedation occurs, administer 0.2 mg IV over 15 sec. If after 45 sec no response, administer 0.2 mg again over 1 min; may repeat at 1 min intervals; not to exceed 4 doses (1 mg)   ropivacaine (PF) 2 mg/mL (0.2%) (NAROPIN) injection 9 mL   ropivacaine (PF) 2 mg/mL (0.2%) (NAROPIN) injection 9 mL   dexamethasone (DECADRON) injection 10 mg   dexamethasone (DECADRON) injection 20 mg   dexamethasone (DECADRON) injection 10 mg   Medications administered: We administered iohexol, lidocaine, diazepam, ropivacaine (PF) 2 mg/mL (0.2%), ropivacaine (PF) 2 mg/mL (0.2%), dexamethasone, dexamethasone, and dexamethasone.  See the medical record for exact dosing, route, and time of administration.  Follow-up plan:   Return in about 3 weeks (around 02/07/2022) for Post Procedure Evaluation, virtual.       s/p IA hyalgan #1 on 08/19/2019, #2 09/23/2019 not very effective unfortunately.  Lumbar MRI shows facet disease at L3, L4, L5 bilaterally.  Bilateral L3, L4, L5 medial branch nerve block #1; right glenohumeral shoulder joint injection (anterior approach)  10/19/2020, 03/13/2021             Recent Visits Date Type Provider Dept  12/26/21 Office Visit Gillis Santa, MD [facility]  Showing recent visits within past 90 days and meeting all other requirements Today's Visits Date Type Provider Dept  01/17/22 Procedure visit Gillis Santa, MD [facility]  Showing today's visits and meeting all other requirements Future Appointments Date Type Provider Dept  02/07/22 Appointment Gillis Santa, MD [facility]  03/22/22 Appointment Gillis Santa, MD [facility]  Showing future appointments within next 90 days and meeting all other requirements  Disposition: Discharge home  Discharge (Date   Time): 01/17/2022; 1413 hrs.   Primary Care Physician: Marval Regal, NP Location: [facility] Note by: Gillis Santa, MD Date: 01/17/2022; Time: 3:13 PM  Disclaimer:  Medicine is not an Chief Strategy Officer. The only guarantee in medicine is that nothing is guaranteed. It is important to note that the decision to proceed with this intervention was based on the information collected from the patient. The Data and conclusions were drawn from the patient's questionnaire, the interview, and the physical examination. Because the information was provided in large part by the patient, it cannot be guaranteed that it has not been purposely or unconsciously manipulated. Every effort has been made to obtain as much relevant data as possible for this evaluation. It is important to note that the conclusions that lead to this procedure are derived in large part from the available data. Always take into account that the treatment will also be dependent on availability of resources and  existing treatment guidelines, considered by other Pain Management Practitioners as being common knowledge and practice, at the time of the intervention. For Medico-Legal purposes, it is also important to point out that variation in  procedural techniques and pharmacological choices are the acceptable norm. The indications, contraindications, technique, and results of the above procedure should only be interpreted and judged by a Board-Certified Interventional Pain Specialist with extensive familiarity and expertise in the same exact procedure and technique.

## 2022-01-17 NOTE — Progress Notes (Signed)
Safety precautions to be maintained throughout the outpatient stay will include: orient to surroundings, keep bed in low position, maintain call bell within reach at all times, provide assistance with transfer out of bed and ambulation.  

## 2022-01-18 ENCOUNTER — Telehealth: Payer: Self-pay | Admitting: *Deleted

## 2022-01-18 NOTE — Telephone Encounter (Signed)
Post procedure call;  voicemail left.  

## 2022-02-06 ENCOUNTER — Telehealth: Payer: Self-pay

## 2022-02-06 NOTE — Telephone Encounter (Signed)
LM for patient to call office forr pre virtual appointment questions.

## 2022-02-07 ENCOUNTER — Telehealth: Payer: Self-pay

## 2022-02-07 ENCOUNTER — Ambulatory Visit
Payer: 59 | Attending: Student in an Organized Health Care Education/Training Program | Admitting: Student in an Organized Health Care Education/Training Program

## 2022-02-07 ENCOUNTER — Other Ambulatory Visit: Payer: Self-pay

## 2022-02-07 ENCOUNTER — Encounter: Payer: Self-pay | Admitting: Student in an Organized Health Care Education/Training Program

## 2022-02-07 DIAGNOSIS — M5412 Radiculopathy, cervical region: Secondary | ICD-10-CM

## 2022-02-07 NOTE — Progress Notes (Signed)
I attempted to call the patient however no response. Voicemail left instructing patient to call front desk office at [phone] to reschedule appointment. -Dr Chandan Fly  

## 2022-02-07 NOTE — Telephone Encounter (Signed)
Attempted to call patient no answer. Left message to call us back so we can get information needed for todays visit.

## 2022-03-07 ENCOUNTER — Encounter: Payer: Self-pay | Admitting: Student in an Organized Health Care Education/Training Program

## 2022-03-08 ENCOUNTER — Telehealth: Payer: Self-pay | Admitting: Student in an Organized Health Care Education/Training Program

## 2022-03-08 DIAGNOSIS — G894 Chronic pain syndrome: Secondary | ICD-10-CM

## 2022-03-08 MED ORDER — METHYLPREDNISOLONE 4 MG PO TBPK: ORAL_TABLET | ORAL | 0 refills | Status: AC

## 2022-03-08 NOTE — Telephone Encounter (Signed)
I have had a long conversation with [name].  She is having a flare of her neck pain.  Tingling radiating across the shoulders.  Now is really mostly nerve pain and popping in her neck.  By the end of the day the pain is severe.  She is able to go home and take her pain medicine and lay down with heat.  She is also taking Gabapentin.  Does not have any muscle relaxers.  She was taking Amitryptiline but caused a significant weight gain.  Any other options?  ?

## 2022-03-08 NOTE — Telephone Encounter (Signed)
Spoke with patient and she is weighing the odds of both those choices.  She thinks she would like to do the taper and have the IM injections. She can only get here tomorrow.  Could she have both.  She could come tomorrow for IM injection.  I told her I would ask if we can use both modalities and will see if order can be placed.  ?

## 2022-03-08 NOTE — Telephone Encounter (Signed)
Returned patient phone call to let her know that [facility] has sent in steroid taper and to let her know that she can come tomorrow for toradol/norflex 30/30 mg.  She states she will be here around 0900.  Patient verbalizes u/o information given.  ?

## 2022-03-09 ENCOUNTER — Ambulatory Visit
Payer: 59 | Attending: Student in an Organized Health Care Education/Training Program | Admitting: Student in an Organized Health Care Education/Training Program

## 2022-03-09 ENCOUNTER — Encounter: Payer: Self-pay | Admitting: Student in an Organized Health Care Education/Training Program

## 2022-03-09 ENCOUNTER — Other Ambulatory Visit: Payer: Self-pay

## 2022-03-09 VITALS — BP 148/91 | HR 76 | Temp 96.9°F | Resp 16 | Ht 68.0 in | Wt 200.0 lb

## 2022-03-09 DIAGNOSIS — M5412 Radiculopathy, cervical region: Secondary | ICD-10-CM | POA: Insufficient documentation

## 2022-03-09 MED ORDER — KETOROLAC TROMETHAMINE 60 MG/2ML IM SOLN
INTRAMUSCULAR | Status: AC
  Filled 2022-03-09: qty 2

## 2022-03-09 MED ORDER — KETOROLAC TROMETHAMINE 60 MG/2ML IM SOLN
60.0000 mg | Freq: Once | INTRAMUSCULAR | Status: AC
  Administered 2022-03-09: 60 mg via INTRAMUSCULAR

## 2022-03-09 MED ORDER — ORPHENADRINE CITRATE 30 MG/ML IJ SOLN
60.0000 mg | Freq: Once | INTRAMUSCULAR | Status: AC
  Administered 2022-03-09: 60 mg via INTRAMUSCULAR

## 2022-03-09 MED ORDER — ORPHENADRINE CITRATE 30 MG/ML IJ SOLN
INTRAMUSCULAR | Status: AC
  Filled 2022-03-09: qty 2

## 2022-03-12 NOTE — Progress Notes (Signed)
1 

## 2022-03-22 ENCOUNTER — Ambulatory Visit
Payer: 59 | Attending: Student in an Organized Health Care Education/Training Program | Admitting: Student in an Organized Health Care Education/Training Program

## 2022-03-22 ENCOUNTER — Encounter: Payer: Self-pay | Admitting: Student in an Organized Health Care Education/Training Program

## 2022-03-22 VITALS — BP 139/66 | HR 85 | Temp 96.9°F | Ht 68.0 in | Wt 200.0 lb

## 2022-03-22 DIAGNOSIS — M5412 Radiculopathy, cervical region: Secondary | ICD-10-CM

## 2022-03-22 DIAGNOSIS — M47816 Spondylosis without myelopathy or radiculopathy, lumbar region: Secondary | ICD-10-CM | POA: Diagnosis present

## 2022-03-22 DIAGNOSIS — M19011 Primary osteoarthritis, right shoulder: Secondary | ICD-10-CM

## 2022-03-22 DIAGNOSIS — M170 Bilateral primary osteoarthritis of knee: Secondary | ICD-10-CM

## 2022-03-22 DIAGNOSIS — G894 Chronic pain syndrome: Secondary | ICD-10-CM

## 2022-03-22 MED ORDER — GABAPENTIN 600 MG PO TABS: 600.0000 mg | ORAL_TABLET | Freq: Every day | ORAL | 5 refills | Status: DC

## 2022-03-22 MED ORDER — KETOROLAC TROMETHAMINE 30 MG/ML IJ SOLN
30.0000 mg | Freq: Once | INTRAMUSCULAR | Status: AC
  Administered 2022-03-22: 30 mg via INTRAMUSCULAR
  Filled 2022-03-22: qty 1

## 2022-03-22 MED ORDER — HYDROCODONE-ACETAMINOPHEN 5-325 MG PO TABS: 1.0000 | ORAL_TABLET | Freq: Three times a day (TID) | ORAL | 0 refills | Status: AC | PRN

## 2022-03-22 MED ORDER — AMITRIPTYLINE HCL 25 MG PO TABS: 25.0000 mg | ORAL_TABLET | Freq: Every day | ORAL | 2 refills | Status: DC

## 2022-03-22 MED ORDER — ORPHENADRINE CITRATE 30 MG/ML IJ SOLN
30.0000 mg | Freq: Once | INTRAMUSCULAR | Status: AC
  Administered 2022-03-22: 30 mg via INTRAMUSCULAR
  Filled 2022-03-22: qty 2

## 2022-03-22 MED ORDER — HYDROCODONE-ACETAMINOPHEN 5-325 MG PO TABS: 1.0000 | ORAL_TABLET | Freq: Three times a day (TID) | ORAL | 0 refills | Status: DC | PRN

## 2022-03-22 NOTE — Progress Notes (Signed)
PROVIDER NOTE: Information contained herein reflects review and annotations entered in association with encounter. Interpretation of such information and data should be left to medically-trained personnel. Information provided to patient can be located elsewhere in the medical record under "Patient Instructions". Document created using STT-dictation technology, any transcriptional errors that may result from process are unintentional.  ?  ?Patient: [name]  Service Category: E/M  Provider: Gillis Santa, MD  ?DOB: November 04, 1961  DOS: 03/22/2022  Specialty: Interventional Pain Management  ?MRN: [id number]  Setting: Ambulatory outpatient  PCP: Marval Regal, NP  ?Type: Established Patient    Referring Provider: Marval Regal, NP  ?Location: Office  Delivery: Face-to-face    ? ?HPI  ?[name], a 61 y.o. year old female, is here today because of her Cervical radicular pain [M54.12]. [name] primary complain today is Neck Pain (lower) ? ?Last encounter: My last encounter with her was on 03/09/22 ? ?Pertinent problems: [name] has Obesity; Arthritis of wrist; Chronic pain syndrome; Lumbar facet arthropathy; Bilateral primary osteoarthritis of knee; Bilateral hip pain; Chronic SI joint pain; Lumbar radiculopathy; Long-term current use of opiate analgesic; Right rotator cuff tear arthropathy; Chronic right shoulder pain; Fibromyalgia; and Cervical radicular pain (left) on their pertinent problem list. ?Pain Assessment: Severity of Chronic pain is reported as a 8  (back, knee, ankle)/10. Location: Neck Right, Left, Lower/pain radiaties everywhere. Onset: More than a month ago. Quality: Constant, Aching, Discomfort, Throbbing, Stabbing, Shooting. Timing: Constant. Modifying factor(s): heat, meds, sitting still. ?Vitals:  height is '5\' 8"'$  (1.727 m) and weight is 200 lb (90.7 kg). Her temperature is 96.9 ?F (36.1 ?C) (abnormal). Her blood pressure is 139/66 and her pulse is 85. Her oxygen saturation  is 100%.  ? ?Reason for encounter:  ? ?-Patient states that her intramuscular Norflex and Toradol shot that she received a couple of weeks ago along with her prednisone taper was really helpful for her neck pain. 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  She states that this job is less stressful. ?-She will be changing insurance and she would like to repeat a cervical epidural steroid injection when that is changed ?-Previously her amitriptyline was increased to 50 mg nightly and she states that that resulted in weight gain.  She would like to try a lower dose as amitriptyline has been helpful for her neuropathic pain ?-Refill gabapentin and hydrocodone as below, no change in dose ? ? ?Pharmacotherapy Assessment  ?Analgesic: hydrocodone 5 mg every 8 hours as needed, quantity 90/month   ? ?Monitoring: ?[place] PMP: PDMP reviewed during this encounter.       ?Pharmacotherapy: No side-effects or adverse reactions reported. ?Compliance: No problems identified. ?Effectiveness: Clinically acceptable. ? ?Chauncey Fischer, RN  03/22/2022  1:57 PM  Sign when Signing Visit ?Nursing Pain Medication Assessment:  ?Safety precautions to be maintained throughout the outpatient stay will include: orient to surroundings, keep bed in low position, maintain call bell within reach at all times, provide assistance with transfer out of bed and ambulation.  ?Medication Inspection Compliance: Pill count conducted under aseptic conditions, in front of the patient. Neither the pills nor the bottle was removed from the patient's sight at any time. Once count was completed pills were immediately returned to the patient in their original bottle. ? ?Medication: Hydrocodone/APAP ?Pill/Patch Count:  38 of 90 pills remain ?Pill/Patch Appearance: Markings consistent with prescribed medication ?Bottle Appearance: Standard pharmacy container. Clearly labeled. ?Filled  Date: 3 / 41 / 2023 ?Last Medication intake:   TodaySafety precautions to be maintained throughout the outpatient stay will include: orient to surroundings, keep bed in low position, maintain call bell within reach at all times, provide assistance with transfer out of bed and ambulation.  ? ? ?Pt stated that she left a few pills at home ?  UDS:  ?Summary  ?Date Value Ref Range Status  ?07/27/2021 Note  Final  ?  Comment:  ?  ==================================================================== ?[facility] Select 13 (MW) ?==================================================================== ?Test                             Result       Flag       Units ? ?Drug Present and Declared for Prescription Verification ?  Hydrocodone                    118          EXPECTED   ng/mg creat ?  Norhydrocodone                 297          EXPECTED   ng/mg creat ?   Sources of hydrocodone include scheduled prescription medications. ?   Norhydrocodone is an expected metabolite of hydrocodone. ? ?==================================================================== ?Test                      Result    Flag   Units      Ref Range ?  Creatinine              94               mg/dL      >=20 ?==================================================================== ?Declared Medications: ? The flagging and interpretation on this report are based on the ? following declared medications.  Unexpected results may arise from ? inaccuracies in the declared medications. ? ? **Note: The testing scope of this panel includes these medications: ? ? Hydrocodone (Norco) ? ? **Note: The testing scope of this panel does not include the ? following reported medications: ? ? Acetaminophen (Norco) ? Amitriptyline (Elavil) ? Duloxetine (Cymbalta) ? Gabapentin (Neurontin) ? Melatonin ? Multivitamin ? Omeprazole (Prilosec) ? Valacyclovir (Valtrex) ?==================================================================== ?For clinical consultation, please call [phone]. ?==================================================================== ?  ?  ? ?ROS  ?Constitutional: Denies any fever or chills ?Gastrointestinal: No reported hemesis, hematochezia, vomiting, or acute GI distress ?Musculoskeletal:  Diffuse arthralgias and myalgias , bilateral wrist, thumb pain ?Neurological: No reported episodes of acute onset apraxia, aphasia, dysarthria, agnosia, amnesia, paralysis, loss of coordination, or loss of consciousness ? ?Medication Review  ?HYDROcodone-acetaminophen, Melatonin, amitriptyline, gabapentin, multivitamin, omeprazole, and valACYclovir ? ?History Review  ?Allergy: [name] has No Known Allergies. ?Drug: [name]  reports no history of drug use. ?Alcohol:  reports that she does not currently use alcohol. ?Tobacco:  reports that she quit smoking about 19 years ago. Her smoking use included cigarettes. She has never used smokeless tobacco. ?Social: [name]  reports that she quit smoking about 19 years ago. Her smoking use included cigarettes. She has never used smokeless tobacco. She reports that she does not currently use alcohol. She reports that she does not use drugs. ?Medical:  has a past medical history of Arthritis, Degenerative disorder of bone, Genuine stress incontinence, female (2012), GERD (gastroesophageal reflux disease), Migraines, and Obesity. ?Surgical: [name]  has a past surgical history  that includes Laparoscopic hysterectomy (10/23/2010); Midurethral sling (05/02/2011); and excision of vulvar lesion (05/02/2011). ?Family: family history includes Arthritis in her maternal grandmother and mother; Cancer in her father and paternal grandfather; Diabetes in her son; Heart attack in her maternal grandfather, mother, and paternal grandmother; Hypertension in her mother. ? ?Laboratory Chemistry Profile  ? ?Renal ?Lab Results  ?Component Value Date  ? BUN 10 02/24/2021  ? CREATININE 0.83 02/24/2021  ? BCR 12 06/16/2018  ? GFR 74.95 02/09/2021  ? GFRAA  92 06/16/2018  ? GFRNONAA >60 02/24/2021  ?  Hepatic ?Lab Results  ?Component Value Date  ? AST 18 02/01/2021  ? ALT 18 02/01/2021  ? ALBUMIN 4.1 02/01/2021  ? ALKPHOS 81 02/01/2021  ?  ?Electrolytes ?Lab Results  ?Component Value Date  ? NA 138 02/24/2021

## 2022-03-22 NOTE — Progress Notes (Signed)
Nursing Pain Medication Assessment:  ?Safety precautions to be maintained throughout the outpatient stay will include: orient to surroundings, keep bed in low position, maintain call bell within reach at all times, provide assistance with transfer out of bed and ambulation.  ?Medication Inspection Compliance: Pill count conducted under aseptic conditions, in front of the patient. Neither the pills nor the bottle was removed from the patient's sight at any time. Once count was completed pills were immediately returned to the patient in their original bottle. ? ?Medication: Hydrocodone/APAP ?Pill/Patch Count:  38 of 90 pills remain ?Pill/Patch Appearance: Markings consistent with prescribed medication ?Bottle Appearance: Standard pharmacy container. Clearly labeled. ?Filled Date: 3 / 22 / 2023 ?Last Medication intake:  TodaySafety precautions to be maintained throughout the outpatient stay will include: orient to surroundings, keep bed in low position, maintain call bell within reach at all times, provide assistance with transfer out of bed and ambulation.  ? ? ?Pt stated that she left a few pills at home ?

## 2022-04-02 MED FILL — Ketorolac Tromethamine Inj 30 MG/ML: INTRAMUSCULAR | Qty: 1 | Status: AC

## 2022-04-02 MED FILL — Orphenadrine Citrate Inj 30 MG/ML: INTRAMUSCULAR | Qty: 2 | Status: AC

## 2022-07-03 ENCOUNTER — Ambulatory Visit
Payer: BC Managed Care – PPO | Attending: Student in an Organized Health Care Education/Training Program | Admitting: Student in an Organized Health Care Education/Training Program

## 2022-07-03 ENCOUNTER — Encounter: Payer: Self-pay | Admitting: Student in an Organized Health Care Education/Training Program

## 2022-07-03 VITALS — BP 142/101 | HR 92 | Temp 97.9°F | Resp 16 | Ht 68.0 in | Wt 200.0 lb

## 2022-07-03 DIAGNOSIS — G894 Chronic pain syndrome: Secondary | ICD-10-CM | POA: Diagnosis present

## 2022-07-03 DIAGNOSIS — M5412 Radiculopathy, cervical region: Secondary | ICD-10-CM | POA: Diagnosis present

## 2022-07-03 DIAGNOSIS — M47816 Spondylosis without myelopathy or radiculopathy, lumbar region: Secondary | ICD-10-CM | POA: Insufficient documentation

## 2022-07-03 DIAGNOSIS — M170 Bilateral primary osteoarthritis of knee: Secondary | ICD-10-CM | POA: Diagnosis present

## 2022-07-03 DIAGNOSIS — M19011 Primary osteoarthritis, right shoulder: Secondary | ICD-10-CM | POA: Diagnosis present

## 2022-07-03 DIAGNOSIS — M797 Fibromyalgia: Secondary | ICD-10-CM | POA: Diagnosis present

## 2022-07-03 MED ORDER — HYDROCODONE-ACETAMINOPHEN 5-325 MG PO TABS: 1.0000 | ORAL_TABLET | Freq: Three times a day (TID) | ORAL | 0 refills | Status: AC | PRN

## 2022-07-03 MED ORDER — HYDROCODONE-ACETAMINOPHEN 5-325 MG PO TABS: 1.0000 | ORAL_TABLET | Freq: Three times a day (TID) | ORAL | 0 refills | Status: DC | PRN

## 2022-07-03 NOTE — Patient Instructions (Addendum)
Epidural Steroid Injection Patient Information  Description: The epidural space surrounds the nerves as they exit the spinal cord.  In some patients, the nerves can be compressed and inflamed by a bulging disc or a tight spinal canal (spinal stenosis).  By injecting steroids into the epidural space, we can bring irritated nerves into direct contact with a potentially helpful medication.  These steroids act directly on the irritated nerves and can reduce swelling and inflammation which often leads to decreased pain.  Epidural steroids may be injected anywhere along the spine and from the neck to the low back depending upon the location of your pain.   After numbing the skin with local anesthetic (like Novocaine), a small needle is passed into the epidural space slowly.  You may experience a sensation of pressure while this is being done.  The entire block usually last less than 10 minutes.  Conditions which may be treated by epidural steroids:  Low back and leg pain Neck and arm pain Spinal stenosis Post-laminectomy syndrome Herpes zoster (shingles) pain Pain from compression fractures  Preparation for the injection:  Do not eat any solid food or dairy products within 8 hours of your appointment.  You may drink clear liquids up to 3 hours before appointment.  Clear liquids include water, black coffee, juice or soda.  No milk or cream please. You may take your regular medication, including pain medications, with a sip of water before your appointment  Diabetics should hold regular insulin (if taken separately) and take 1/2 normal NPH dos the morning of the procedure.  Carry some sugar containing items with you to your appointment. A driver must accompany you and be prepared to drive you home after your procedure.  Bring all your current medications with your. An IV may be inserted and sedation may be given at the discretion of the physician.   A blood pressure cuff, EKG and other monitors will  often be applied during the procedure.  Some patients may need to have extra oxygen administered for a short period. You will be asked to provide medical information, including your allergies, prior to the procedure.  We must know immediately if you are taking blood thinners (like Coumadin/Warfarin)  Or if you are allergic to IV iodine contrast (dye). We must know if you could possible be pregnant.  Possible side-effects: Bleeding from needle site Infection (rare, may require surgery) Nerve injury (rare) Numbness & tingling (temporary) Difficulty urinating (rare, temporary) Spinal headache ( a headache worse with upright posture) Light -headedness (temporary) Pain at injection site (several days) Decreased blood pressure (temporary) Weakness in arm/leg (temporary) Pressure sensation in back/neck (temporary)  Call if you experience: Fever/chills associated with headache or increased back/neck pain. Headache worsened by an upright position. New onset weakness or numbness of an extremity below the injection site Hives or difficulty breathing (go to the emergency room) Inflammation or drainage at the infection site Severe back/neck pain Any new symptoms which are concerning to you  Please note:  Although the local anesthetic injected can often make your back or neck feel good for several hours after the injection, the pain will likely return.  It takes 3-7 days for steroids to work in the epidural space.  You may not notice any pain relief for at least that one week.  If effective, we will often do a series of three injections spaced 3-6 weeks apart to maximally decrease your pain.  After the initial series, we generally will wait several months before   considering a repeat injection of the same type.  If you have any questions, please call [phone] [facility] 

## 2022-07-03 NOTE — Progress Notes (Signed)
PROVIDER NOTE: Information contained herein reflects review and annotations entered in association with encounter. Interpretation of such information and data should be left to medically-trained personnel. Information provided to patient can be located elsewhere in the medical record under "Patient Instructions". Document created using STT-dictation technology, any transcriptional errors that may result from process are unintentional.    Patient: [name]  Service Category: E/M  Provider: Gillis Santa, MD  DOB: 1961-05-30  DOS: 07/03/2022  Specialty: Interventional Pain Management  MRN: [id number]  Setting: Ambulatory outpatient  PCP: Marval Regal, NP  Type: Established Patient    Referring Provider: Marval Regal, NP  Location: Office  Delivery: Face-to-face     HPI  [name], a 61 y.o. year old female, is here today because of her Cervical radicular pain [M54.12]. Ms. Carreno primary complain today is Joint Pain  Last encounter: My last encounter with her was on 03/22/22  Pertinent problems: Ms. Belknap has Obesity; Arthritis of wrist; Chronic pain syndrome; Lumbar facet arthropathy; Bilateral primary osteoarthritis of knee; Bilateral hip pain; Chronic SI joint pain; Lumbar radiculopathy; Long-term current use of opiate analgesic; Right rotator cuff tear arthropathy; Chronic right shoulder pain; Fibromyalgia; and Cervical radicular pain (left) on their pertinent problem list. Pain Assessment: Severity of Chronic pain is reported as a 8 /10. Location: Other (Comment) (joint pain)  / . Onset: More than a month ago. Quality: Aching, Burning, Sharp, Shooting. Timing: Constant. Modifying factor(s): meds. Vitals:  height is '5\' 8"'  (1.727 m) and weight is 200 lb (90.7 kg). Her temporal temperature is 97.9 F (36.6 C). Her blood pressure is 142/101 (abnormal) and her pulse is 92. Her respiration is 16 and oxygen saturation is 98%.   Reason for encounter:   -patient has a new job  and has been sitting more often and has been dealing with increased LE swelling, discussing walking every 60-120 mins at work and utilizing compression stocking -has discontinued Amitriptyline due to side effects of bloating/weight gain -otherwise continues Hydrocodone as prescribed along with Gabapentin at night -Is endorsing increased neck pain with radiation into the left and right bicep and occasionally forearm related to cervical radiculopathy.  Previous cervical ESI was in February 2023 which provided her with 65% pain relief for about 4 months.  Now she is having return of pain and would like to repeat. -She is also endorsing increased bilateral knee pain related to knee osteoarthritis.  She is status post bilateral genicular nerve block in February 2023 that provided her with 70% pain relief for 4 and half months.  We will repeat.  Future considerations include genicular nerve RFA.    Pharmacotherapy Assessment  Analgesic: hydrocodone 5 mg every 8 hours as needed, quantity 90/month    Monitoring: [place] PMP: PDMP reviewed during this encounter.       Pharmacotherapy: No side-effects or adverse reactions reported. Compliance: No problems identified. Effectiveness: Clinically acceptable.  [name], RN  07/03/2022  8:14 AM  Sign when Signing Visit Nursing Pain Medication Assessment:  Safety precautions to be maintained throughout the outpatient stay will include: orient to surroundings, keep bed in low position, maintain call bell within reach at all times, provide assistance with transfer out of bed and ambulation.  Medication Inspection Compliance: Pill count conducted under aseptic conditions, in front of the patient. Neither the pills nor the bottle was removed from the patient's sight at any time. Once count was completed pills were immediately returned to the patient in their original bottle.  Medication: Hydrocodone/APAP Pill/Patch Count:  36 of 90 pills remain Pill/Patch  Appearance: Markings consistent with prescribed medication Bottle Appearance: Standard pharmacy container. Clearly labeled. Filled Date: 06 / 27 / 2023 Last Medication intake:  Yesterday     UDS:  Summary  Date Value Ref Range Status  07/27/2021 Note  Final    Comment:    ==================================================================== [employer]ox[employer] Select 13 (MW) ==================================================================== Test                             Result       Flag       Units  Drug Present and Declared for Prescription Verification   Hydrocodone                    118          EXPECTED   ng/mg creat   Norhydrocodone                 297          EXPECTED   ng/mg creat    Sources of hydrocodone include scheduled prescription medications.    Norhydrocodone is an expected metabolite of hydrocodone.  ==================================================================== Test                      Result    Flag   Units      Ref Range   Creatinine              94               mg/dL      >=20 ==================================================================== Declared Medications:  The flagging and interpretation on this report are based on the  following declared medications.  Unexpected results may arise from  inaccuracies in the declared medications.   **Note: The testing scope of this panel includes these medications:   Hydrocodone (Norco)   **Note: The testing scope of this panel does not include the  following reported medications:   Acetaminophen (Norco)  Amitriptyline (Elavil)  Duloxetine (Cymbalta)  Gabapentin (Neurontin)  Melatonin  Multivitamin  Omeprazole (Prilosec)  Valacyclovir (Valtrex) ==================================================================== For clinical consultation, please call [phone]. ====================================================================      ROS  Constitutional: Denies any fever or  chills Gastrointestinal: No reported hemesis, hematochezia, vomiting, or acute GI distress Musculoskeletal: Cervical spine pain with radiation to bilateral upper extremities, bilateral knee pain Neurological: No reported episodes of acute onset apraxia, aphasia, dysarthria, agnosia, amnesia, paralysis, loss of coordination, or loss of consciousness  Medication Review  HYDROcodone-acetaminophen, Melatonin, gabapentin, multivitamin, omeprazole, and valACYclovir  History Review  Allergy: Ms. Pellum has No Known Allergies. Drug: Ms. Mcelwee  reports no history of drug use. Alcohol:  reports that she does not currently use alcohol. Tobacco:  reports that she quit smoking about 19 years ago. Her smoking use included cigarettes. She has never used smokeless tobacco. Social: Ms. Kozuch  reports that she quit smoking about 19 years ago. Her smoking use included cigarettes. She has never used smokeless tobacco. She reports that she does not currently use alcohol. She reports that she does not use drugs. Medical:  has a past medical history of Arthritis, Degenerative disorder of bone, Genuine stress incontinence, female (2012), GERD (gastroesophageal reflux disease), Migraines, and Obesity. Surgical: Ms. Feria  has a past surgical history that includes Laparoscopic hysterectomy (10/23/2010); Midurethral sling (05/02/2011); and excision of vulvar lesion (05/02/2011). Family: family history includes Arthritis in  her maternal grandmother and mother; Cancer in her father and paternal grandfather; Diabetes in her son; Heart attack in her maternal grandfather, mother, and paternal grandmother; Hypertension in her mother.  Laboratory Chemistry Profile   Renal Lab Results  Component Value Date   BUN 10 02/24/2021   CREATININE 0.83 02/24/2021   BCR 12 06/16/2018   GFR 74.95 02/09/2021   GFRAA 92 06/16/2018   GFRNONAA >60 02/24/2021    Hepatic Lab Results  Component Value Date   AST 18 02/01/2021   ALT 18  02/01/2021   ALBUMIN 4.1 02/01/2021   ALKPHOS 81 02/01/2021    Electrolytes Lab Results  Component Value Date   NA 138 02/24/2021   K 3.9 02/24/2021   CL 107 02/24/2021   CALCIUM 9.8 02/24/2021   MG 1.8 01/30/2019    Bone Lab Results  Component Value Date   VD25OH 41.01 10/28/2020    Inflammation (CRP: Acute Phase) (ESR: Chronic Phase) No results found for: "CRP", "ESRSEDRATE", "LATICACIDVEN"       Note: Above Lab results reviewed.   Physical Exam  General appearance: Well nourished, well developed, and well hydrated. In no apparent acute distress Mental status: Alert, oriented x 3 (person, place, & time)       Respiratory: No evidence of acute respiratory distress Eyes: PERLA Vitals: BP (!) 142/101   Pulse 92   Temp 97.9 F (36.6 C) (Temporal)   Resp 16   Ht '5\' 8"'  (1.727 m)   Wt 200 lb (90.7 kg)   SpO2 98%   BMI 30.41 kg/m  BMI: Estimated body mass index is 30.41 kg/m as calculated from the following:   Height as of this encounter: '5\' 8"'  (1.727 m).   Weight as of this encounter: 200 lb (90.7 kg). Ideal: Ideal body weight: 63.9 kg (140 lb 14 oz) Adjusted ideal body weight: 74.6 kg (164 lb 8.4 oz)  Cervical Spine Area Exam  Skin & Axial Inspection: No masses, redness, edema, swelling, or associated skin lesions Alignment: Symmetrical Functional ROM: Decreased ROM      Stability: No instability detected Muscle Tone/Strength: Functionally intact. No obvious neuro-muscular anomalies detected. Sensory (Neurological): Dermatomal pain pattern, left Palpation: No palpable anomalies               Upper Extremity (UE) Exam      Side: Right upper extremity   Side: Left upper extremity  Skin & Extremity Inspection: Skin color, temperature, and hair growth are WNL. No peripheral edema or cyanosis. No masses, redness, swelling, asymmetry, or associated skin lesions. No contractures.   Skin & Extremity Inspection: Skin color, temperature, and hair growth are WNL. No  peripheral edema or cyanosis. No masses, redness, swelling, asymmetry, or associated skin lesions. No contractures.  Functional ROM: Unrestricted range of motion   Functional ROM: Unrestricted ROM          Muscle Tone/Strength: Functionally intact. No obvious neuro-muscular anomalies detected.   Muscle Tone/Strength: Functionally intact. No obvious neuro-muscular anomalies detected.  Sensory (Neurological): Arthropathic arthralgia,      Sensory (Neurological): Dermatomal left        Palpation: No palpable anomalies               Palpation: No palpable anomalies              Provocative Test(s):  Phalen's test: deferred Tinel's test: deferred Apley's scratch test (touch opposite shoulder):  Action 1 (Across chest): Decreased ROM, improved after treatment Action 2 (Overhead): Decreased ROM, improved  after treatment Action 3 (LB reach): Decreased ROM, improved after treatment     Provocative Test(s):  Phalen's test: deferred Tinel's test: deferred Apley's scratch test (touch opposite shoulder):  Action 1 (Across chest): deferred Action 2 (Overhead): deferred Action 3 (LB reach): deferred       Lumbar Spine Area Exam  Skin & Axial Inspection: No masses, redness, or swelling Alignment: Symmetrical Functional ROM: Pain restricted ROM right greater than left       Stability: No instability detected Muscle Tone/Strength: Functionally intact. No obvious neuro-muscular anomalies detected. Sensory (Neurological): Dermatomal pain pattern on the right   Gait & Posture Assessment  Ambulation: Unassisted Gait: Relatively normal for age and body habitus Posture: WNL  Lower Extremity Exam      Side: Right lower extremity   Side: Left lower extremity  Stability: No instability observed           Stability: No instability observed          Skin & Extremity Inspection: Skin color, temperature, and hair growth are WNL. No peripheral edema or cyanosis. No masses, redness, swelling, asymmetry, or  associated skin lesions. No contractures.   Skin & Extremity Inspection: Skin color, temperature, and hair growth are WNL. No peripheral edema or cyanosis. No masses, redness, swelling, asymmetry, or associated skin lesions. No contractures.  Functional ROM: Pain restricted ROM for hip and knee joints Limited SLR (straight leg raise)   Functional ROM: Pain restricted ROM for hip and knee joints          Muscle Tone/Strength: Functionally intact. No obvious neuro-muscular anomalies detected.   Muscle Tone/Strength: Functionally intact. No obvious neuro-muscular anomalies detected.  Sensory (Neurological): Arthropathic pain pattern of knee       Sensory (Neurological): Arthropathic pain pattern of knee          DTR: Patellar: deferred today Achilles: deferred today Plantar: deferred today   DTR: Patellar: deferred today Achilles: deferred today Plantar: deferred today  Palpation: No palpable anomalies   Palpation: No palpable anomalies          Assessment   Status Diagnosis  Having a Flare-up Having a Flare-up Controlled 1. Cervical radicular pain   2. Bilateral primary osteoarthritis of knee   3. Fibromyalgia   4. Lumbar spondylosis   5. Primary osteoarthritis of right shoulder   6. Chronic pain syndrome         Plan of Care    [name] has a current medication list which includes the following long-term medication(s): gabapentin and omeprazole.  1. Cervical radicular pain - Cervical Epidural Injection; Future  2. Bilateral primary osteoarthritis of knee - GENICULAR NERVE BLOCK; Future  3. Fibromyalgia  4. Lumbar spondylosis - HYDROcodone-acetaminophen (NORCO/VICODIN) 5-325 MG tablet; Take 1 tablet by mouth every 8 (eight) hours as needed for moderate pain.  Dispense: 90 tablet; Refill: 0 - HYDROcodone-acetaminophen (NORCO/VICODIN) 5-325 MG tablet; Take 1 tablet by mouth every 8 (eight) hours as needed for moderate pain.  Dispense: 90 tablet; Refill: 0 -  HYDROcodone-acetaminophen (NORCO/VICODIN) 5-325 MG tablet; Take 1 tablet by mouth every 8 (eight) hours as needed for moderate pain.  Dispense: 90 tablet; Refill: 0  5. Primary osteoarthritis of right shoulder  6. Chronic pain syndrome - ToxASSURE Select 13 (MW), Urine - HYDROcodone-acetaminophen (NORCO/VICODIN) 5-325 MG tablet; Take 1 tablet by mouth every 8 (eight) hours as needed for moderate pain.  Dispense: 90 tablet; Refill: 0 - HYDROcodone-acetaminophen (NORCO/VICODIN) 5-325 MG tablet; Take 1 tablet by mouth  every 8 (eight) hours as needed for moderate pain.  Dispense: 90 tablet; Refill: 0 - HYDROcodone-acetaminophen (NORCO/VICODIN) 5-325 MG tablet; Take 1 tablet by mouth every 8 (eight) hours as needed for moderate pain.  Dispense: 90 tablet; Refill: 0 - Cervical Epidural Injection; Future - GENICULAR NERVE BLOCK; Future -Continue with gabapentin as prescribed no refills needed -Discussed compression stockings, walking, activity to help with venous stasis edema given sitting at rest.   Pharmacotherapy (Medications Ordered): Meds ordered this encounter  Medications   HYDROcodone-acetaminophen (NORCO/VICODIN) 5-325 MG tablet    Sig: Take 1 tablet by mouth every 8 (eight) hours as needed for moderate pain.    Dispense:  90 tablet    Refill:  0   HYDROcodone-acetaminophen (NORCO/VICODIN) 5-325 MG tablet    Sig: Take 1 tablet by mouth every 8 (eight) hours as needed for moderate pain.    Dispense:  90 tablet    Refill:  0   HYDROcodone-acetaminophen (NORCO/VICODIN) 5-325 MG tablet    Sig: Take 1 tablet by mouth every 8 (eight) hours as needed for moderate pain.    Dispense:  90 tablet    Refill:  0    Orders:  Orders Placed This Encounter  Procedures   Cervical Epidural Injection    Sedation: Patient's choice. Purpose: Diagnostic/Therapeutic Indication(s): Radiculitis and cervicalgia associater with cervical degenerative disc disease.    Standing Status:   Future     Standing Expiration Date:   10/03/2022    Scheduling Instructions:     Procedure: Cervical Epidural Steroid Injection/Block     Level(s): C7-T1     Laterality: TBD     Timeframe: As soon as schedule allows    Order Specific Question:   Where will this procedure be performed?    Answer:   [facility]    Comments:   Shanesha Bednarz   GENICULAR NERVE BLOCK    Indication(s):  Sub-acute knee pain    Standing Status:   Future    Standing Expiration Date:   10/03/2022    Scheduling Instructions:     Side: Bilateral #2     Sedation: Patient's choice.     Timeframe: As soon as schedule allows    Order Specific Question:   Where will this procedure be performed?    Answer:   [facility]   ToxASSURE Select 13 (MW), Urine    Volume: 30 ml(s). Minimum 3 ml of urine is needed. Document temperature of fresh sample. Indications: Long term (current) use of opiate analgesic (Z02.585)    Order Specific Question:   Release to patient    Answer:   Immediate    Follow-up plan:   Return in about 1 week (around 07/10/2022) for C-ESI & B/L GNB with PO Valium .    Recent Visits No visits were found meeting these conditions. Showing recent visits within past 90 days and meeting all other requirements Today's Visits Date Type Provider Dept  07/03/22 Office Visit Gillis Santa, MD [facility]  Showing today's visits and meeting all other requirements Future Appointments Date Type Provider Dept  08/01/22 Appointment Gillis Santa, MD [facility]  Showing future appointments within next 90 days and meeting all other requirements  I discussed the assessment and treatment plan with the patient. The patient was provided an opportunity to ask questions and all were answered. The patient agreed with the plan and demonstrated an understanding of the instructions.  Patient advised to call back or seek an in-person evaluation if  the symptoms or condition worsens.  Duration of  encounter: 65mnutes.  Note by: BGillis Santa MD Date: 07/03/2022; Time: 9:01 AM

## 2022-07-03 NOTE — Progress Notes (Signed)
Nursing Pain Medication Assessment:  Safety precautions to be maintained throughout the outpatient stay will include: orient to surroundings, keep bed in low position, maintain call bell within reach at all times, provide assistance with transfer out of bed and ambulation.  Medication Inspection Compliance: Pill count conducted under aseptic conditions, in front of the patient. Neither the pills nor the bottle was removed from the patient's sight at any time. Once count was completed pills were immediately returned to the patient in their original bottle.  Medication: Hydrocodone/APAP Pill/Patch Count:  36 of 90 pills remain Pill/Patch Appearance: Markings consistent with prescribed medication Bottle Appearance: Standard pharmacy container. Clearly labeled. Filled Date: 06 / 27 / 2023 Last Medication intake:  Yesterday

## 2022-07-07 LAB — TOXASSURE SELECT 13 (MW), URINE

## 2022-08-01 ENCOUNTER — Ambulatory Visit
Payer: BC Managed Care – PPO | Attending: Student in an Organized Health Care Education/Training Program | Admitting: Student in an Organized Health Care Education/Training Program

## 2022-08-01 ENCOUNTER — Ambulatory Visit
Admission: RE | Admit: 2022-08-01 | Discharge: 2022-08-01 | Disposition: A | Discharge status: Home / Self Care | Payer: BC Managed Care – PPO | Source: Ambulatory Visit | Attending: Student in an Organized Health Care Education/Training Program | Admitting: Student in an Organized Health Care Education/Training Program

## 2022-08-01 ENCOUNTER — Encounter: Payer: Self-pay | Admitting: Student in an Organized Health Care Education/Training Program

## 2022-08-01 VITALS — BP 120/60 | HR 62 | Temp 97.3°F | Resp 16 | Ht 68.0 in | Wt 210.0 lb

## 2022-08-01 DIAGNOSIS — M170 Bilateral primary osteoarthritis of knee: Secondary | ICD-10-CM | POA: Insufficient documentation

## 2022-08-01 DIAGNOSIS — M5412 Radiculopathy, cervical region: Secondary | ICD-10-CM | POA: Diagnosis not present

## 2022-08-01 DIAGNOSIS — G894 Chronic pain syndrome: Secondary | ICD-10-CM | POA: Insufficient documentation

## 2022-08-01 MED ORDER — DIAZEPAM 5 MG PO TABS
ORAL_TABLET | ORAL | Status: AC
  Filled 2022-08-01: qty 1

## 2022-08-01 MED ORDER — LIDOCAINE HCL 2 % IJ SOLN
20.0000 mL | Freq: Once | INTRAMUSCULAR | Status: AC
  Administered 2022-08-01: 400 mg
  Filled 2022-08-01: qty 20

## 2022-08-01 MED ORDER — DEXAMETHASONE SODIUM PHOSPHATE 10 MG/ML IJ SOLN
10.0000 mg | Freq: Once | INTRAMUSCULAR | Status: AC
  Administered 2022-08-01: 10 mg
  Filled 2022-08-01: qty 1

## 2022-08-01 MED ORDER — ROPIVACAINE HCL 2 MG/ML IJ SOLN
1.0000 mL | Freq: Once | INTRAMUSCULAR | Status: AC
  Administered 2022-08-01: 1 mL via EPIDURAL
  Filled 2022-08-01: qty 20

## 2022-08-01 MED ORDER — ROPIVACAINE HCL 2 MG/ML IJ SOLN
9.0000 mL | Freq: Once | INTRAMUSCULAR | Status: AC
Start: 2022-08-01 — End: 2022-08-01
  Administered 2022-08-01: 9 mL via PERINEURAL
  Filled 2022-08-01: qty 20

## 2022-08-01 MED ORDER — DIAZEPAM 5 MG PO TABS
5.0000 mg | ORAL_TABLET | ORAL | Status: AC
  Administered 2022-08-01: 5 mg via ORAL

## 2022-08-01 MED ORDER — SODIUM CHLORIDE 0.9% FLUSH
1.0000 mL | Freq: Once | INTRAVENOUS | Status: AC
  Administered 2022-08-01: 1 mL

## 2022-08-01 MED ORDER — IOHEXOL 180 MG/ML  SOLN
10.0000 mL | Freq: Once | INTRAMUSCULAR | Status: AC
  Administered 2022-08-01: 10 mL via EPIDURAL
  Filled 2022-08-01: qty 20

## 2022-08-01 NOTE — Progress Notes (Signed)
PROVIDER NOTE: Interpretation of information contained herein should be left to medically-trained personnel. Specific patient instructions are provided elsewhere under "Patient Instructions" section of medical record. This document was created in part using STT-dictation technology, any transcriptional errors that may result from this process are unintentional.  Patient: [name] Type: Established DOB: 10-18-1961 MRN: [id number] PCP: Vanessa Regal, NP  Service: Procedure DOS: 08/01/2022 Setting: Ambulatory Location: Ambulatory outpatient facility Delivery: Face-to-face Provider: Gillis Santa, MD Specialty: Interventional Pain Management Specialty designation: 09 Location: Outpatient facility Ref. Prov.: Vanessa Regal, NP   Procedure [facility] Interventional Pain Management )   Type: Cervical Epidural Steroid injection (ESI) (Interlaminar) #2 (#1 01/17/22) Laterality: Left  Level: C7-T1 Imaging: Fluoroscopy-assisted DOS: 08/01/2022  Performed by: Vanessa Santa, MD Anesthesia: Local anesthesia (1-2% Lidocaine) Anxiolysis: Oral Valium 5 mg Sedation: None.   Purpose: Diagnostic/Therapeutic Indications: Cervicalgia, cervical radicular pain, degenerative disc disease, severe enough to impact quality of life or function. 1. Cervical radicular pain   2. Bilateral primary osteoarthritis of knee   3. Chronic pain syndrome    NAS-11 score:   Pre-procedure: 5 /10   Post-procedure: 0-No pain (knee)/10     C-MRI CLINICAL DATA:  Posterior neck pain for 1 month. No known injury.   EXAM: MRI CERVICAL SPINE WITHOUT CONTRAST   TECHNIQUE: Multiplanar, multisequence MR imaging of the cervical spine was performed. No intravenous contrast was administered.   COMPARISON:  None.   FINDINGS: Alignment: Anatomic   Vertebrae: No acute fracture, evidence of discitis, or bone lesion.   Cord: Normal signal and morphology.   Posterior Fossa, vertebral arteries, paraspinal tissues:  Posterior fossa demonstrates no focal abnormality. Vertebral artery flow voids are maintained. Paraspinal soft tissues are unremarkable.   Disc levels:   Discs: Degenerative disease with disc height loss at C5-6 and C6-7. Disc desiccation throughout the cervical spine.   C2-3: No significant disc bulge. Moderate left facet arthropathy. No foraminal stenosis. No spinal stenosis.   C3-4: Broad-based disc bulge. Moderate left facet arthropathy. Mild right facet arthropathy. Severe left foraminal stenosis. Mild right foraminal stenosis. No spinal stenosis.   C4-5: No significant disc bulge. Moderate right and mild left facet arthropathy. Moderate bilateral foraminal stenosis.   C5-6: Broad-based disc osteophyte complex. Bilateral uncovertebral degenerative changes. Moderate-severe bilateral foraminal stenosis. Mild spinal stenosis.   C6-7: Broad-based disc osteophyte complex. Left uncovertebral degenerative changes. Moderate-severe left foraminal stenosis. Mild right foraminal stenosis. Moderate spinal stenosis.   C7-T1: No significant disc bulge. No neural foraminal stenosis. No central canal stenosis.   IMPRESSION: 1. At C3-4 there is a broad-based disc bulge. Moderate left facet arthropathy. Mild right facet arthropathy. Severe left foraminal stenosis. Mild right foraminal stenosis. 2. At C4-5 there is moderate right and mild left facet arthropathy. Moderate bilateral foraminal stenosis. 3. At C5-6 there is a broad-based disc osteophyte complex. Bilateral uncovertebral degenerative changes. Moderate-severe bilateral foraminal stenosis. Mild spinal stenosis. 4. At C6-7 there is a broad-based disc osteophyte complex. Left uncovertebral degenerative changes. Moderate-severe left foraminal stenosis. Mild right foraminal stenosis. Moderate spinal stenosis. 5.  No acute osseous injury of the cervical spine.  Pre-Procedure Preparation  Monitoring: As per clinic protocol.  Respiration, ETCO2, SpO2, BP, heart rate and rhythm monitor placed and checked for adequate function  Risk Assessment: Vitals:  [id number] body mass index is 31.93 kg/m as calculated from the following:   Height as of this encounter: '5\' 8"'$  (1.727 m).   Weight as of this encounter: 210 lb (95.3 kg)., Rate:80 , BP:(!)  127/112, Resp:16, Temp:(!) 97.3 F (36.3 C), SpO2:98 %  Allergies: She has No Known Allergies.  Precautions: None required  Blood-thinner(s): None at this time  Coagulopathies: Reviewed. None identified.   Active Infection(s): Reviewed. None identified. [name] is afebrile   Location setting: Procedure suite Position: Prone, on modified reverse trendelenburg to facilitate breathing, with head in head-cradle. Pillows positioned under chest (below chin-level) with cervical spine flexed. Safety Precautions: Patient was assessed for positional comfort and pressure points before starting the procedure. Prepping solution: DuraPrep (Iodine Povacrylex [0.7% available iodine] and Isopropyl Alcohol, 74% w/w) Prep Area: Entire  cervicothoracic region Approach: percutaneous, paramedial Intended target: Posterior cervical epidural space Materials: Tray: Epidural Needle(s): Epidural (Tuohy) Qty: 1 Length: (4m) 3.5-inch Gauge: 22G  4.5 cc solution made of 2 cc of preservative-free saline, 1.5 cc of 0.2% ropivacaine, 1 cc of Decadron 10 mg/cc.    Meds ordered this encounter  Medications   iohexol (OMNIPAQUE) 180 MG/ML injection 10 mL    Must be Myelogram-compatible. If not available, you may substitute with a water-soluble, non-ionic, hypoallergenic, myelogram-compatible radiological contrast medium.   lidocaine (XYLOCAINE) 2 % (with pres) injection 400 mg   sodium chloride flush (NS) 0.9 % injection 1 mL   ropivacaine (PF) 2 mg/mL (0.2%) (NAROPIN) injection 1 mL   dexamethasone (DECADRON) injection 10 mg   dexamethasone (DECADRON) injection 10 mg   ropivacaine (PF) 2 mg/mL  (0.2%) (NAROPIN) injection 9 mL   diazepam (VALIUM) tablet 5 mg    Make sure Flumazenil is available in the pyxis when using this medication. If oversedation occurs, administer 0.2 mg IV over 15 sec. If after 45 sec no response, administer 0.2 mg again over 1 min; may repeat at 1 min intervals; not to exceed 4 doses (1 mg)    Orders Placed This Encounter  Procedures   [facility] 1-60 MIN NO REPORT    Intraoperative interpretation by procedural physician [place]    Standing Status:   Standing    Number of Occurrences:   1    Order Specific Question:   Reason for exam:    Answer:   Assistance in needle guidance and placement for procedures requiring needle placement in or near specific anatomical locations not easily accessible without such assistance.     Time-out: 1059 (time out for knees at 1115) I initiated and conducted the "Time-out" before starting the procedure, as per protocol. The patient was asked to participate by confirming the accuracy of the "Time Out" information. Verification of the correct person, site, and procedure were performed and confirmed by me, the nursing staff, and the patient. "Time-out" conducted as per Joint Commission's Universal Protocol (UP.01.01.01). Procedure checklist: Completed   H&P (Pre-op  Assessment)  Ms. SBelknapis a 61y.o. (year old), female patient, seen today for interventional treatment. She  has a past surgical history that includes Laparoscopic hysterectomy (10/23/2010); Midurethral sling (05/02/2011); and excision of vulvar lesion (05/02/2011). Ms. SSallehas a current medication list which includes the following prescription(s): gabapentin, hydrocodone-acetaminophen, [START ON 08/10/2022] hydrocodone-acetaminophen, [START ON 09/09/2022] hydrocodone-acetaminophen, melatonin, multivitamin, omeprazole, and valacyclovir. Her primarily concern today is the Neck Pain and Knee Pain (bilat)  She has No Known Allergies.   Last  encounter: My last encounter with her was on 01/10/2022. Pertinent problems: Ms. SWyerhas Obesity; Arthritis of wrist; Chronic pain syndrome; Lumbar facet arthropathy; Bilateral primary osteoarthritis of knee; Bilateral hip pain; Chronic SI joint pain; Lumbar radiculopathy; Long-term current use of opiate  analgesic; Right rotator cuff tear arthropathy; Chronic right shoulder pain; Fibromyalgia; and Cervical radicular pain (left) on their pertinent problem list. Pain Assessment: Severity of Chronic pain is reported as a 5 /10. Location: Neck  /to left shoulder and upper left arm; "tingles out to right shoulder. - LEFT SIDE WORSE. Onset: More than a month ago. Quality: Aching, Burning, Sharp, Shooting. Timing: Constant. Modifying factor(s): meds. Vitals:  height is '5\' 8"'$  (1.727 m) and weight is 210 lb (95.3 kg). Her temporal temperature is 97.3 F (36.3 C) (abnormal). Her blood pressure is 120/60 and her pulse is 62. Her respiration is 16 and oxygen saturation is 99%.   Reason for encounter: Interventional pain management therapy due pain of at least four (4) weeks in duration, with to failure to respond to and/or inability to tolerate more conservative care.   Site Confirmation: Ms. Jiles was asked to confirm the procedure and laterality before marking the site.  Consent: Before the procedure and under the influence of no sedative(s), amnesic(s), or anxiolytics, the patient was informed of the treatment options, risks and possible complications. To fulfill our ethical and legal obligations, as recommended by the American Medical Association's Code of Ethics, I have informed the patient of my clinical impression; the nature and purpose of the treatment or procedure; the risks, benefits, and possible complications of the intervention; the alternatives, including doing nothing; the risk(s) and benefit(s) of the alternative treatment(s) or procedure(s); and the risk(s) and benefit(s) of doing nothing. The  patient was provided information about the general risks and possible complications associated with the procedure. These may include, but are not limited to: failure to achieve desired goals, infection, bleeding, organ or nerve damage, allergic reactions, paralysis, and death. In addition, the patient was informed of those risks and complications associated to Spine-related procedures, such as failure to decrease pain; infection (i.e.: Meningitis, epidural or intraspinal abscess); bleeding (i.e.: epidural hematoma, subarachnoid hemorrhage, or any other type of intraspinal or peri-dural bleeding); organ or nerve damage (i.e.: Any type of peripheral nerve, nerve root, or spinal cord injury) with subsequent damage to sensory, motor, and/or autonomic systems, resulting in permanent pain, numbness, and/or weakness of one or several areas of the body; allergic reactions; (i.e.: anaphylactic reaction); and/or death. Furthermore, the patient was informed of those risks and complications associated with the medications. These include, but are not limited to: allergic reactions (i.e.: anaphylactic or anaphylactoid reaction(s)); adrenal axis suppression; blood sugar elevation that in diabetics may result in ketoacidosis or comma; water retention that in patients with history of congestive heart failure may result in shortness of breath, pulmonary edema, and decompensation with resultant heart failure; weight gain; swelling or edema; medication-induced neural toxicity; particulate matter embolism and blood vessel occlusion with resultant organ, and/or nervous system infarction; and/or aseptic necrosis of one or more joints. Finally, the patient was informed that Medicine is not an exact science; therefore, there is also the possibility of unforeseen or unpredictable risks and/or possible complications that may result in a catastrophic outcome. The patient indicated having understood very clearly. We have given the patient no  guarantees and we have made no promises. Enough time was given to the patient to ask questions, all of which were answered to the patient's satisfaction. Ms. Shiffman has indicated that she wanted to continue with the procedure. Attestation: I, the ordering provider, attest that I have discussed with the patient the benefits, risks, side-effects, alternatives, likelihood of achieving goals, and potential problems during recovery for the procedure that  I have provided informed consent.  Date  Time: 08/01/2022  9:31 AM   Description of procedure   Start Time: 1059 (start time for bilateral knees at 1115) hrs  Local Anesthesia: Once the patient was positioned, prepped, and time-out was completed. The target area was identified located. The skin was marked with an approved surgical skin marker. Once marked, the skin (epidermis, dermis, and hypodermis), and deeper tissues (fat, connective tissue and muscle) were infiltrated with a small amount of a short-acting local anesthetic, loaded on a 10cc syringe with a 25G, 1.5-in  Needle. An appropriate amount of time was allowed for local anesthetics to take effect before proceeding to the next step. Local Anesthetic: Lidocaine 1-2% The unused portion of the local anesthetic was discarded in the proper designated containers. Safety Precautions: Aspiration looking for blood return was conducted prior to all injections. At no point did I inject any substances, as a needle was being advanced. Before injecting, the patient was told to immediately notify me if she was experiencing any new onset of "ringing in the ears, or metallic taste in the mouth". No attempts were made at seeking any paresthesias. Safe injection practices and needle disposal techniques used. Medications properly checked for expiration dates. SDV (single dose vial) medications used. After the completion of the procedure, all disposable equipment used was discarded in the proper designated medical waste  containers.  Technical description: Protocol guidelines were followed. Using fluoroscopic guidance, the epidural needle was introduced through the skin, ipsilateral to the reported pain, and advanced to the target area. Posterior laminar os was contacted and the needle walked caudad, until the lamina was cleared. The ligamentum flavum was engaged and the epidural space identified using "loss-of-resistance technique" with 2-3 ml of PF-NaCl (0.9% NSS), in a 5cc dedicated LOR syringe. See "Imaging guidance" below for use of contrast details.  Injection: Once satisfactory needle placement was confirmed, I proceeded to inject the desired solution in slow, incremental fashion, intermittently assessing for discomfort or any signs of abnormal or undesired spread of substance. Once completed, the needle was removed and disposed of, as per hospital protocols.   Vitals:   08/01/22 1112 08/01/22 1117 08/01/22 1122 08/01/22 1127  BP: 118/64 120/65 118/63 120/60  Pulse: 69 63 (!) 58 62  Resp: '15 15 16 16  '$ Temp:      TempSrc:      SpO2: 97% 100% 100% 99%  Weight:      Height:         End Time: 1104 (stopped time for knees at 1 min 36 sec) hrs  Once the entire procedure was completed, the treated area was cleaned, making sure to leave some of the prepping solution back to take advantage of its long term bactericidal properties.   Imaging guidance  Type of Imaging Technique: Fluoroscopy Guidance (Spinal) Indication(s): Assistance in needle guidance and placement for procedures requiring needle placement in or near specific anatomical locations not easily accessible without such assistance. Exposure Time: Please see nurses notes for exact fluoroscopy time. Contrast: Before injecting any contrast, we confirmed that the patient did not have an allergy to iodine, shellfish, or radiological contrast. Once satisfactory needle placement was completed, radiological contrast was injected under continuous  fluoroscopic guidance. Injection of contrast accomplished without complications. See chart for type and volume of contrast used. Fluoroscopic Guidance: I was personally present in the fluoroscopy suite, where the patient was placed in position for the procedure, over the fluoroscopy-compatible table. Fluoroscopy was manipulated, using "Tunnel Vision  Technique", to obtain the best possible view of the target area, on the affected side. Parallax error was corrected before commencing the procedure. A "direction-depth-direction" technique was used to introduce the needle under continuous pulsed fluoroscopic guidance. Once the target was reached, antero-posterior, oblique, and lateral fluoroscopic projection views were taken to confirm needle placement in all planes. Electronic images uploaded into EMR.  Interpretation: Successful epidural injection. Intraoperative imaging interpretation by performing Physician.    Post-op assessment  Post-procedure Vital Signs:  Pulse/HCG Rate: 62  Temp: (!) 97.3 F (36.3 C) Resp: 16 BP: 120/60 SpO2: 99 %  EBL: None  Complications: No immediate post-treatment complications observed by team, or reported by patient.  Note: The patient tolerated the entire procedure well. A repeat set of vitals were taken after the procedure and the patient was kept under observation following institutional policy, for this type of procedure. Post-procedural neurological assessment was performed, showing return to baseline, prior to discharge. The patient was provided with post-procedure discharge instructions, including a section on how to identify potential problems. Should any problems arise concerning this procedure, the patient was given instructions to immediately contact us, at any time, without hesitation. In any case, we plan to contact the patient by telephone for a follow-up status report regarding this interventional procedure.  Comments:  No additional relevant information.    Plan of care  Chronic Opioid Analgesic:  hydrocodone 5 mg every 8 hours as needed, quantity 90/month    Medications administered: We administered iohexol, lidocaine, sodium chloride flush, ropivacaine (PF) 2 mg/mL (0.2%), dexamethasone, dexamethasone, ropivacaine (PF) 2 mg/mL (0.2%), and diazepam.  Follow-up plan:   Return in about 4 weeks (around 08/29/2022) for Post Procedure Evaluation, virtual.     Recent Visits Date Type Provider Dept  07/03/22 Office Visit Vanessa Santa, MD [facility]  Showing recent visits within past 90 days and meeting all other requirements Today's Visits Date Type Provider Dept  08/01/22 Procedure visit Vanessa Santa, MD [facility]  Showing today's visits and meeting all other requirements Future Appointments Date Type Provider Dept  08/29/22 Appointment Vanessa Santa, MD [facility]  10/02/22 Appointment Vanessa Santa, MD [facility]  Showing future appointments within next 90 days and meeting all other requirements   Disposition: Discharge home  Discharge (Date  Time): 08/01/2022; 1130 hrs.   Primary Care Physician: Vanessa Regal, NP Location: [facility] Note by: Vanessa Santa, MD Date: 08/01/2022; Time: 12:02 PM  DISCLAIMER: Medicine is not an exact science. It has no guarantees or warranties. The decision to proceed with this intervention was based on the information collected from the patient. Conclusions were drawn from the patient's questionnaire, interview, and examination. Because information was provided in large part by the patient, it cannot be guaranteed that it has not been purposely or unconsciously manipulated or altered. Every effort has been made to obtain as much accurate, relevant, available data as possible. Always take into account that the treatment will also be dependent on availability of resources and existing treatment guidelines, considered by other Pain  Management Specialists as being common knowledge and practice, at the time of the intervention. It is also important to point out that variation in procedural techniques and pharmacological choices are the acceptable norm. For Medico-Legal review purposes, the indications, contraindications, technique, and results of the these procedures should only be evaluated, judged and interpreted by a Board-Certified Interventional Pain Specialist with extensive familiarity and expertise in the same exact procedure and  technique.

## 2022-08-01 NOTE — Progress Notes (Signed)
PROVIDER NOTE: Interpretation of information contained herein should be left to medically-trained personnel. Specific patient instructions are provided elsewhere under "Patient Instructions" section of medical record. This document was created in part using STT-dictation technology, any transcriptional errors that may result from this process are unintentional.  Patient: [name] Type: Established DOB: Jul 27, 1961 MRN: [id number] PCP: Marval Regal, NP  Service: Procedure DOS: 08/01/2022 Setting: Ambulatory Location: Ambulatory outpatient facility Delivery: Face-to-face Provider: Gillis Santa, MD Specialty: Interventional Pain Management Specialty designation: 09 Location: Outpatient facility Ref. Prov.: Marval Regal, NP    Primary Reason for Visit: Interventional Pain Management Treatment. CC: Neck Pain and Knee Pain (bilat)    Procedure:          Anesthesia, Analgesia, Anxiolysis:  Type: Genicular Nerves Block (Superolateral, Superomedial, and Inferomedial Genicular Nerves) #2 (#1 01/17/22)  CPT: 09470      Primary Purpose: Diagnostic Region: Lateral, Anterior, and Medial aspects of the knee joint, above and below the knee joint proper. Level: Superior and inferior to the knee joint. Target Area: For Genicular Nerve block(s), the targets are: the superolateral genicular nerve, located in the lateral distal portion of the femoral shaft as it curves to form the lateral epicondyle, in the region of the distal femoral metaphysis; the superomedial genicular nerve, located in the medial distal portion of the femoral shaft as it curves to form the medial epicondyle; and the inferomedial genicular nerve, located in the medial, proximal portion of the tibial shaft, as it curves to form the medial epicondyle, in the region of the proximal tibial metaphysis. Approach: Anterior, percutaneous, ipsilateral approach. Laterality: Bilateral  Anesthesia: Local (1-2% Lidocaine)  Anxiolysis:  Oral '5mg'$  PO Valium Sedation: Minimal  Guidance: Fluoroscopy           Position: Modified Fowler's position with pillows under the targeted knee(s).   B/L Knee OA  NAS-11 Pain score:   Pre-procedure: 5 /10   Post-procedure: 0-No pain (knee)/10     Pre-op H&P Assessment:  [name] is a 61 y.o. (year old), female patient, seen today for interventional treatment. She  has a past surgical history that includes Laparoscopic hysterectomy (10/23/2010); Midurethral sling (05/02/2011); and excision of vulvar lesion (05/02/2011). [name] has a current medication list which includes the following prescription(s): gabapentin, hydrocodone-acetaminophen, [START ON 08/10/2022] hydrocodone-acetaminophen, [START ON 09/09/2022] hydrocodone-acetaminophen, melatonin, multivitamin, omeprazole, and valacyclovir. Her primarily concern today is the Neck Pain and Knee Pain (bilat)  Initial Vital Signs:  Pulse/HCG Rate: 80  Temp: (!) 97.3 F (36.3 C) Resp: 16 BP: (!) 127/112 SpO2: 98 %  BMI: Estimated body mass index is 31.93 kg/m as calculated from the following:   Height as of this encounter: '5\' 8"'$  (1.727 m).   Weight as of this encounter: 210 lb (95.3 kg).  Risk Assessment: Allergies: Reviewed. She has No Known Allergies.  Allergy Precautions: None required Coagulopathies: Reviewed. None identified.  Blood-thinner therapy: None at this time Active Infection(s): Reviewed. None identified. [name] is afebrile  Site Confirmation: [name] was asked to confirm the procedure and laterality before marking the site Procedure checklist: Completed Consent: Before the procedure and under the influence of no sedative(s), amnesic(s), or anxiolytics, the patient was informed of the treatment options, risks and possible complications. To fulfill our ethical and legal obligations, as recommended by the American Medical Association's Code of Ethics, I have informed the patient of my clinical impression; the  nature and purpose of the treatment or procedure; the risks, benefits, and possible complications  of the intervention; the alternatives, including doing nothing; the risk(s) and benefit(s) of the alternative treatment(s) or procedure(s); and the risk(s) and benefit(s) of doing nothing. The patient was provided information about the general risks and possible complications associated with the procedure. These may include, but are not limited to: failure to achieve desired goals, infection, bleeding, organ or nerve damage, allergic reactions, paralysis, and death. In addition, the patient was informed of those risks and complications associated to the procedure, such as failure to decrease pain; infection; bleeding; organ or nerve damage with subsequent damage to sensory, motor, and/or autonomic systems, resulting in permanent pain, numbness, and/or weakness of one or several areas of the body; allergic reactions; (i.e.: anaphylactic reaction); and/or death. Furthermore, the patient was informed of those risks and complications associated with the medications. These include, but are not limited to: allergic reactions (i.e.: anaphylactic or anaphylactoid reaction(s)); adrenal axis suppression; blood sugar elevation that in diabetics may result in ketoacidosis or comma; water retention that in patients with history of congestive heart failure may result in shortness of breath, pulmonary edema, and decompensation with resultant heart failure; weight gain; swelling or edema; medication-induced neural toxicity; particulate matter embolism and blood vessel occlusion with resultant organ, and/or nervous system infarction; and/or aseptic necrosis of one or more joints. Finally, the patient was informed that Medicine is not an exact science; therefore, there is also the possibility of unforeseen or unpredictable risks and/or possible complications that may result in a catastrophic outcome. The patient indicated having  understood very clearly. We have given the patient no guarantees and we have made no promises. Enough time was given to the patient to ask questions, all of which were answered to the patient's satisfaction. Ms. Badia has indicated that she wanted to continue with the procedure. Attestation: I, the ordering provider, attest that I have discussed with the patient the benefits, risks, side-effects, alternatives, likelihood of achieving goals, and potential problems during recovery for the procedure that I have provided informed consent. Date  Time: 08/01/2022  9:31 AM  Pre-Procedure Preparation:  Monitoring: As per clinic protocol. Respiration, ETCO2, SpO2, BP, heart rate and rhythm monitor placed and checked for adequate function Safety Precautions: Patient was assessed for positional comfort and pressure points before starting the procedure. Time-out: I initiated and conducted the "Time-out" before starting the procedure, as per protocol. The patient was asked to participate by confirming the accuracy of the "Time Out" information. Verification of the correct person, site, and procedure were performed and confirmed by me, the nursing staff, and the patient. "Time-out" conducted as per Joint Commission's Universal Protocol ([email or web address].01.01.01). Time: 1059 (time out for knees at 1115)  Description of Procedure:          Area Prepped: Entire knee area, from mid-thigh to mid-shin, lateral, anterior, and medial aspects. DuraPrep (Iodine Povacrylex [0.7% available iodine] and Isopropyl Alcohol, 74% w/w) Safety Precautions: Aspiration looking for blood return was conducted prior to all injections. At no point did we inject any substances, as a needle was being advanced. No attempts were made at seeking any paresthesias. Safe injection practices and needle disposal techniques used. Medications properly checked for expiration dates. SDV (single dose vial) medications used. Description of the Procedure: Protocol  guidelines were followed. The patient was placed in position over the procedure table. The target area was identified and the area prepped in the usual manner. Skin & deeper tissues infiltrated with local anesthetic. Appropriate amount of time allowed to pass for local anesthetics to take  effect. The procedure needles were then advanced to the target area. Proper needle placement secured. Negative aspiration confirmed. Solution injected in intermittent fashion, asking for systemic symptoms every 0.5cc of injectate. The needles were then removed and the area cleansed, making sure to leave some of the prepping solution back to take advantage of its long term bactericidal properties.      Vitals:   08/01/22 1112 08/01/22 1117 08/01/22 1122 08/01/22 1127  BP: 118/64 120/65 118/63 120/60  Pulse: 69 63 (!) 58 62  Resp: '15 15 16 16  '$ Temp:      TempSrc:      SpO2: 97% 100% 100% 99%  Weight:      Height:        Start Time: 1059 (start time for bilateral knees at 1115) hrs. End Time: 1104 (stopped time for knees at 1 min 36 sec) hrs. Materials:  Needle(s) Type: Spinal Needle Gauge: 22G Length: 3.5-in Medication(s): Please see orders for medications and dosing details. 6 cc solution made of 5 cc of 0.2% ropivacaine, 1 cc of Decadron 10 mg/cc.  2 cc injected at each level above for the left knee 6 cc solution made of 5 cc of 0.2% ropivacaine, 1 cc of Decadron 10 mg/cc.  2 cc injected at each level above for the right knee  Imaging Guidance (Non-Spinal):          Type of Imaging Technique: Fluoroscopy Guidance (Non-Spinal) Indication(s): Assistance in needle guidance and placement for procedures requiring needle placement in or near specific anatomical locations not easily accessible without such assistance. Exposure Time: Please see nurses notes. Contrast: None used. Fluoroscopic Guidance: I was personally present during the use of fluoroscopy. "Tunnel Vision Technique" used to obtain the best  possible view of the target area. Parallax error corrected before commencing the procedure. "Direction-depth-direction" technique used to introduce the needle under continuous pulsed fluoroscopy. Once target was reached, antero-posterior, oblique, and lateral fluoroscopic projection used confirm needle placement in all planes. Images permanently stored in EMR. Interpretation: No contrast injected. I personally interpreted the imaging intraoperatively. Adequate needle placement confirmed in multiple planes. Permanent images saved into the patient's record.   Post-operative Assessment:  Post-procedure Vital Signs:  Pulse/HCG Rate: 62  Temp:  (!) 97.3 F (36.3 C) Resp: 16 BP: 120/60 SpO2: 99 %  EBL: None  Complications: No immediate post-treatment complications observed by team, or reported by patient.  Note: The patient tolerated the entire procedure well. A repeat set of vitals were taken after the procedure and the patient was kept under observation following institutional policy, for this type of procedure. Post-procedural neurological assessment was performed, showing return to baseline, prior to discharge. The patient was provided with post-procedure discharge instructions, including a section on how to identify potential problems. Should any problems arise concerning this procedure, the patient was given instructions to immediately contact us, at any time, without hesitation. In any case, we plan to contact the patient by telephone for a follow-up status report regarding this interventional procedure.  Comments:  No additional relevant information.  Plan of Care  Orders:  Orders Placed This Encounter  Procedures   DG PAIN CLINIC C-ARM 1-60 MIN NO REPORT    Intraoperative interpretation by procedural physician at [facility].    Standing Status:   Standing    Number of Occurrences:   1    Order Specific Question:   Reason for exam:    Answer:   Assistance in needle  guidance and placement for  procedures requiring needle placement in or near specific anatomical locations not easily accessible without such assistance.   Chronic Opioid Analgesic:  hydrocodone 5 mg every 8 hours as needed, quantity 90/month    Medications ordered for procedure: Meds ordered this encounter  Medications   iohexol (OMNIPAQUE) 180 MG/ML injection 10 mL    Must be Myelogram-compatible. If not available, you may substitute with a water-soluble, non-ionic, hypoallergenic, myelogram-compatible radiological contrast medium.   lidocaine (XYLOCAINE) 2 % (with pres) injection 400 mg   sodium chloride flush (NS) 0.9 % injection 1 mL   ropivacaine (PF) 2 mg/mL (0.2%) (NAROPIN) injection 1 mL   dexamethasone (DECADRON) injection 10 mg   dexamethasone (DECADRON) injection 10 mg   ropivacaine (PF) 2 mg/mL (0.2%) (NAROPIN) injection 9 mL   diazepam (VALIUM) tablet 5 mg    Make sure Flumazenil is available in the pyxis when using this medication. If oversedation occurs, administer 0.2 mg IV over 15 sec. If after 45 sec no response, administer 0.2 mg again over 1 min; may repeat at 1 min intervals; not to exceed 4 doses (1 mg)   Medications administered: We administered iohexol, lidocaine, sodium chloride flush, ropivacaine (PF) 2 mg/mL (0.2%), dexamethasone, dexamethasone, ropivacaine (PF) 2 mg/mL (0.2%), and diazepam.  See the medical record for exact dosing, route, and time of administration.  Follow-up plan:   Return in about 4 weeks (around 08/29/2022) for Post Procedure Evaluation, virtual.       s/p IA hyalgan #1 on 08/19/2019, #2 09/23/2019 not very effective unfortunately.  Lumbar MRI shows facet disease at L3, L4, L5 bilaterally.  Bilateral L3, L4, L5 medial branch nerve block #1; right glenohumeral shoulder joint injection (anterior approach) 10/19/2020, 03/13/2021             Recent Visits Date Type Provider Dept  07/03/22 Office Visit Gillis Santa, MD [facility]   Showing recent visits within past 90 days and meeting all other requirements Today's Visits Date Type Provider Dept  08/01/22 Procedure visit Gillis Santa, MD [facility]  Showing today's visits and meeting all other requirements Future Appointments Date Type Provider Dept  08/29/22 Appointment Gillis Santa, MD [facility]  10/02/22 Appointment Gillis Santa, MD [facility]  Showing future appointments within next 90 days and meeting all other requirements  Disposition: Discharge home  Discharge (Date  Time): 08/01/2022; 1130 hrs.   Primary Care Physician: Marval Regal, NP Location: [facility] Note by: Gillis Santa, MD Date: 08/01/2022; Time: 12:03 PM  Disclaimer:  Medicine is not an exact science. The only guarantee in medicine is that nothing is guaranteed. It is important to note that the decision to proceed with this intervention was based on the information collected from the patient. The Data and conclusions were drawn from the patient's questionnaire, the interview, and the physical examination. Because the information was provided in large part by the patient, it cannot be guaranteed that it has not been purposely or unconsciously manipulated. Every effort has been made to obtain as much relevant data as possible for this evaluation. It is important to note that the conclusions that lead to this procedure are derived in large part from the available data. Always take into account that the treatment will also be dependent on availability of resources and existing treatment guidelines, considered by other Pain Management Practitioners as being common knowledge and practice, at the time of the intervention. For Medico-Legal purposes, it is also important to point  out that variation in procedural techniques and pharmacological choices are the acceptable norm. The indications, contraindications, technique, and results of the  above procedure should only be interpreted and judged by a Board-Certified Interventional Pain Specialist with extensive familiarity and expertise in the same exact procedure and technique.

## 2022-08-01 NOTE — Progress Notes (Signed)
Safety precautions to be maintained throughout the outpatient stay will include: orient to surroundings, keep bed in low position, maintain call bell within reach at all times, provide assistance with transfer out of bed and ambulation.  

## 2022-08-29 ENCOUNTER — Ambulatory Visit
Payer: BC Managed Care – PPO | Attending: Student in an Organized Health Care Education/Training Program | Admitting: Student in an Organized Health Care Education/Training Program

## 2022-08-29 DIAGNOSIS — M170 Bilateral primary osteoarthritis of knee: Secondary | ICD-10-CM

## 2022-08-29 NOTE — Progress Notes (Signed)
I attempted to call the patient however no response. Voicemail left instructing patient to call front desk office at [phone] to reschedule appointment. -Dr Holley Raring    Procedure:          Anesthesia, Analgesia, Anxiolysis:  Type: Genicular Nerves Block (Superolateral, Superomedial, and Inferomedial Genicular Nerves) #2 (#1 01/17/22)  CPT: 57322      Primary Purpose: Diagnostic Region: Lateral, Anterior, and Medial aspects of the knee joint, above and below the knee joint proper. Level: Superior and inferior to the knee joint. Target Area: For Genicular Nerve block(s), the targets are: the superolateral genicular nerve, located in the lateral distal portion of the femoral shaft as it curves to form the lateral epicondyle, in the region of the distal femoral metaphysis; the superomedial genicular nerve, located in the medial distal portion of the femoral shaft as it curves to form the medial epicondyle; and the inferomedial genicular nerve, located in the medial, proximal portion of the tibial shaft, as it curves to form the medial epicondyle, in the region of the proximal tibial metaphysis. Approach: Anterior, percutaneous, ipsilateral approach. Laterality: Bilateral  Anesthesia: Local (1-2% Lidocaine)  Anxiolysis: Oral '5mg'$  PO Valium Sedation: Minimal  Guidance: Fluoroscopy           Position: Modified Fowler's position with pillows under the targeted knee(s).   B/L Knee OA  NAS-11 Pain score:   Pre-procedure: 5 /10   Post-procedure: 0-No pain (knee)/10      Effectiveness:  Initial hour after procedure: 100 %  Subsequent 4-6 hours post-procedure: 100 %  Analgesia past initial 6 hours: 100 % (3 weeks)  Ongoing improvement:

## 2022-09-11 ENCOUNTER — Telehealth: Payer: Self-pay | Admitting: Student in an Organized Health Care Education/Training Program

## 2022-09-11 NOTE — Telephone Encounter (Signed)
Patient states pharmacy told her they need PA before they can fill her pain meds. Says they sent request over.

## 2022-09-11 NOTE — Telephone Encounter (Signed)
PA sent via [facility] [id number] and has received a favorable outcome.  Patient aware.

## 2022-09-18 ENCOUNTER — Telehealth: Payer: Self-pay

## 2022-09-18 DIAGNOSIS — M47816 Spondylosis without myelopathy or radiculopathy, lumbar region: Secondary | ICD-10-CM

## 2022-09-18 MED ORDER — PREDNISONE 20 MG PO TABS: ORAL_TABLET | ORAL | 0 refills | Status: AC

## 2022-09-18 NOTE — Telephone Encounter (Signed)
The patient called in saying her back is killing her, she cant work, and she wants to know if he will call her in a steroid or something. Please call and let her know.

## 2022-09-18 NOTE — Telephone Encounter (Signed)
Patient notified

## 2022-10-02 ENCOUNTER — Ambulatory Visit
Payer: BC Managed Care – PPO | Attending: Student in an Organized Health Care Education/Training Program | Admitting: Student in an Organized Health Care Education/Training Program

## 2022-10-02 ENCOUNTER — Encounter: Payer: Self-pay | Admitting: Student in an Organized Health Care Education/Training Program

## 2022-10-02 VITALS — BP 151/97 | HR 103 | Resp 18 | Ht 68.0 in | Wt 200.0 lb

## 2022-10-02 DIAGNOSIS — G894 Chronic pain syndrome: Secondary | ICD-10-CM | POA: Insufficient documentation

## 2022-10-02 DIAGNOSIS — M5416 Radiculopathy, lumbar region: Secondary | ICD-10-CM | POA: Insufficient documentation

## 2022-10-02 DIAGNOSIS — M47816 Spondylosis without myelopathy or radiculopathy, lumbar region: Secondary | ICD-10-CM | POA: Diagnosis present

## 2022-10-02 DIAGNOSIS — M797 Fibromyalgia: Secondary | ICD-10-CM | POA: Insufficient documentation

## 2022-10-02 MED ORDER — HYDROCODONE-ACETAMINOPHEN 5-325 MG PO TABS: 1.0000 | ORAL_TABLET | Freq: Three times a day (TID) | ORAL | 0 refills | Status: AC | PRN

## 2022-10-02 MED ORDER — HYDROCODONE-ACETAMINOPHEN 5-325 MG PO TABS: 1.0000 | ORAL_TABLET | Freq: Three times a day (TID) | ORAL | 0 refills | Status: DC | PRN

## 2022-10-02 MED ORDER — GABAPENTIN 600 MG PO TABS: 600.0000 mg | ORAL_TABLET | Freq: Every day | ORAL | 5 refills | Status: DC

## 2022-10-02 NOTE — Progress Notes (Signed)
Nursing Pain Medication Assessment:  Safety precautions to be maintained throughout the outpatient stay will include: orient to surroundings, keep bed in low position, maintain call bell within reach at all times, provide assistance with transfer out of bed and ambulation.  Medication Inspection Compliance: Pill count conducted under aseptic conditions, in front of the patient. Neither the pills nor the bottle was removed from the patient's sight at any time. Once count was completed pills were immediately returned to the patient in their original bottle.  Medication: Hydrocodone/APAP Pill/Patch Count:  30 of 90 pills remain Pill/Patch Appearance: Markings consistent with prescribed medication Bottle Appearance: Standard pharmacy container. Clearly labeled. Filled Date: 09 / 26 / 2023 Last Medication intake:  Today

## 2022-10-02 NOTE — Patient Instructions (Signed)
Presents 1 okay

## 2022-10-02 NOTE — Progress Notes (Signed)
PROVIDER NOTE: Information contained herein reflects review and annotations entered in association with encounter. Interpretation of such information and data should be left to medically-trained personnel. Information provided to patient can be located elsewhere in the medical record under "Patient Instructions". Document created using STT-dictation technology, any transcriptional errors that may result from process are unintentional.    Patient: [name]  Service Category: E/M  Provider: Gillis Santa, MD  DOB: 1961-08-09  DOS: 10/02/2022  Specialty: Interventional Pain Management  MRN: [id number]  Setting: Ambulatory outpatient  PCP: Vanessa Regal, NP  Type: Established Patient    Referring Provider: Marval Regal, NP  Location: Office  Delivery: Face-to-face     HPI  [name], a 61 y.o. year old female, is here today because of her Lumbar radiculopathy [M54.16]. [name] primary complain today is Back Pain (low)  Last encounter: My last encounter with her was on 08/01/22  Pertinent problems: [name] has Obesity; Arthritis of wrist; Chronic pain syndrome; Lumbar spondylosis; Bilateral primary osteoarthritis of knee; Bilateral hip pain; Chronic SI joint pain; Lumbar radiculopathy; Long-term current use of opiate analgesic; Right rotator cuff tear arthropathy; Chronic right shoulder pain; Fibromyalgia; and Cervical radicular pain (left) on their pertinent problem list. Pain Assessment: Severity of Chronic pain is reported as a 5 /10. Location: Back Lower/denies at this time. Onset: More than a month ago. Quality: Aching, Sharp, Stabbing. Timing: Constant. Modifying factor(s): steroid pack. Vitals:  height is _0  (1.727 m) and weight is 200 lb (90.7 kg). Her blood pressure is 151/97 (abnormal) and her pulse is 103 (abnormal). Her respiration is 18 and oxygen saturation is 98%.   Reason for encounter:   [name] presents today for medication management.  She is  complaining of low back pain with radiation into her legs occasionally.  She states that she went camping at the end of last month and experienced a pain flare where she had severe low back pain and difficulty walking.  She states that she may have been too active and may have exerted herself excessively while getting the campsite set up.  She states that she was sitting around a bonfire in a low chair and attempted to stand when she noticed acute on chronic low back pain with radiation into bilateral legs and associated weakness.  She contacted me on October 3 for increased pain and I prescribed her a prednisone taper.  She states that the prednisone taper helped out a lot.  She is in better place now.  We discussed a lumbar epidural steroid injection in the future if this occurs.  Otherwise I will refill her hydrocodone and gabapentin, no change in dose.  UDS up-to-date and appropriate.    Pharmacotherapy Assessment  Analgesic: hydrocodone 5 mg every 8 hours as needed, quantity 90/month    Monitoring: [place] PMP: PDMP not reviewed this encounter.       Pharmacotherapy: No side-effects or adverse reactions reported. Compliance: No problems identified. Effectiveness: Clinically acceptable.  Vanessa Shorter, RN  10/02/2022  2:50 PM  Sign when Signing Visit Nursing Pain Medication Assessment:  Safety precautions to be maintained throughout the outpatient stay will include: orient to surroundings, keep bed in low position, maintain call bell within reach at all times, provide assistance with transfer out of bed and ambulation.  Medication Inspection Compliance: Pill count conducted under aseptic conditions, in front of the patient. Neither the pills nor the bottle was removed from the patient's sight at any time. Once  count was completed pills were immediately returned to the patient in their original bottle.  Medication: Hydrocodone/APAP Pill/Patch Count:  30 of 90 pills remain Pill/Patch Appearance:  Markings consistent with prescribed medication Bottle Appearance: Standard pharmacy container. Clearly labeled. Filled Date: 09 / 26 / 2023 Last Medication intake:  Today   UDS:  Summary  Date Value Ref Range Status  07/03/2022 Note  Final    Comment:    ==================================================================== [facility] (MW) ==================================================================== Test                             Result       Flag       Units  Drug Present and Declared for Prescription Verification   Hydrocodone                    607          EXPECTED   ng/mg creat   Hydromorphone                  99           EXPECTED   ng/mg creat   Dihydrocodeine                 140          EXPECTED   ng/mg creat   Norhydrocodone                 729          EXPECTED   ng/mg creat    Sources of hydrocodone include scheduled prescription medications.    Hydromorphone, dihydrocodeine and norhydrocodone are expected    metabolites of hydrocodone. Hydromorphone and dihydrocodeine are    also available as scheduled prescription medications.  ==================================================================== Test                      Result    Flag   Units      Ref Range   Creatinine              97               mg/dL      >=20 ==================================================================== Declared Medications:  The flagging and interpretation on this report are based on the  following declared medications.  Unexpected results may arise from  inaccuracies in the declared medications.   **Note: The testing scope of this panel includes these medications:   Hydrocodone (Norco)   **Note: The testing scope of this panel does not include the  following reported medications:   Acetaminophen (Norco)  Gabapentin (Neurontin)  Melatonin  Multivitamin  Omeprazole (Prilosec)  Valacyclovir  (Valtrex) ==================================================================== For clinical consultation, please call [phone]. ====================================================================      ROS  Constitutional: Denies any fever or chills Gastrointestinal: No reported hemesis, hematochezia, vomiting, or acute GI distress Musculoskeletal: Cervical spine pain with radiation to bilateral upper extremities, bilateral knee pain, low back pain Neurological: No reported episodes of acute onset apraxia, aphasia, dysarthria, agnosia, amnesia, paralysis, loss of coordination, or loss of consciousness  Medication Review  HYDROcodone-acetaminophen, Melatonin, gabapentin, multivitamin, omeprazole, and valACYclovir  History Review  Allergy: Ms. Hiebert has No Known Allergies. Drug: Ms. Pardy  reports no history of drug use. Alcohol:  reports that she does not currently use alcohol. Tobacco:  reports that she quit smoking about 19 years ago. Her smoking use included cigarettes. She has never  used smokeless tobacco. Social: Ms. Alberico  reports that she quit smoking about 19 years ago. Her smoking use included cigarettes. She has never used smokeless tobacco. She reports that she does not currently use alcohol. She reports that she does not use drugs. Medical:  has a past medical history of Arthritis, Degenerative disorder of bone, Genuine stress incontinence, female (2012), GERD (gastroesophageal reflux disease), Migraines, and Obesity. Surgical: Ms. Trefz  has a past surgical history that includes Laparoscopic hysterectomy (10/23/2010); Midurethral sling (05/02/2011); and excision of vulvar lesion (05/02/2011). Family: family history includes Arthritis in her maternal grandmother and mother; Cancer in her father and paternal grandfather; Diabetes in her son; Heart attack in her maternal grandfather, mother, and paternal grandmother; Hypertension in her mother.  Laboratory Chemistry Profile    Renal Lab Results  Component Value Date   BUN 10 02/24/2021   CREATININE 0.83 02/24/2021   BCR 12 06/16/2018   GFR 74.95 02/09/2021   GFRAA 92 06/16/2018   GFRNONAA >60 02/24/2021    Hepatic Lab Results  Component Value Date   AST 18 02/01/2021   ALT 18 02/01/2021   ALBUMIN 4.1 02/01/2021   ALKPHOS 81 02/01/2021    Electrolytes Lab Results  Component Value Date   NA 138 02/24/2021   K 3.9 02/24/2021   CL 107 02/24/2021   CALCIUM 9.8 02/24/2021   MG 1.8 01/30/2019    Bone Lab Results  Component Value Date   VD25OH 41.01 10/28/2020    Inflammation (CRP: Acute Phase) (ESR: Chronic Phase) No results found for: "CRP", "ESRSEDRATE", "LATICACIDVEN"       Note: Above Lab results reviewed.   Physical Exam  General appearance: Well nourished, well developed, and well hydrated. In no apparent acute distress Mental status: Alert, oriented x 3 (person, place, & time)       Respiratory: No evidence of acute respiratory distress Eyes: PERLA Vitals: BP (!) 151/97   Pulse (!) 103   Resp 18   Ht _0  (1.727 m)   Wt 200 lb (90.7 kg)   SpO2 98%   BMI 30.41 kg/m  BMI: Estimated body mass index is 30.41 kg/m as calculated from the following:   Height as of this encounter: _1  (1.727 m).   Weight as of this encounter: 200 lb (90.7 kg). Ideal: Ideal body weight: 63.9 kg (140 lb 14 oz) Adjusted ideal body weight: 74.6 kg (164 lb 8.4 oz)  Cervical Spine Area Exam  Skin & Axial Inspection: No masses, redness, edema, swelling, or associated skin lesions Alignment: Symmetrical Functional ROM: Decreased ROM      Stability: No instability detected Muscle Tone/Strength: Functionally intact. No obvious neuro-muscular anomalies detected. Sensory (Neurological): Dermatomal pain pattern, left Palpation: No palpable anomalies               Upper Extremity (UE) Exam      Side: Right upper extremity   Side: Left upper extremity  Skin & Extremity Inspection: Skin color,  temperature, and hair growth are WNL. No peripheral edema or cyanosis. No masses, redness, swelling, asymmetry, or associated skin lesions. No contractures.   Skin & Extremity Inspection: Skin color, temperature, and hair growth are WNL. No peripheral edema or cyanosis. No masses, redness, swelling, asymmetry, or associated skin lesions. No contractures.  Functional ROM: Unrestricted range of motion   Functional ROM: Unrestricted ROM          Muscle Tone/Strength: Functionally intact. No obvious neuro-muscular anomalies detected.   Muscle Tone/Strength: Functionally intact. No  obvious neuro-muscular anomalies detected.  Sensory (Neurological): Arthropathic arthralgia,      Sensory (Neurological): Dermatomal left        Palpation: No palpable anomalies               Palpation: No palpable anomalies              Provocative Test(s):  Phalen's test: deferred Tinel's test: deferred Apley's scratch test (touch opposite shoulder):  Action 1 (Across chest): Decreased ROM, improved after treatment Action 2 (Overhead): Decreased ROM, improved after treatment Action 3 (LB reach): Decreased ROM, improved after treatment     Provocative Test(s):  Phalen's test: deferred Tinel's test: deferred Apley's scratch test (touch opposite shoulder):  Action 1 (Across chest): deferred Action 2 (Overhead): deferred Action 3 (LB reach): deferred       Lumbar Spine Area Exam  Skin & Axial Inspection: No masses, redness, or swelling Alignment: Symmetrical Functional ROM: Pain restricted ROM right greater than left       Stability: No instability detected Muscle Tone/Strength: Functionally intact. No obvious neuro-muscular anomalies detected. Sensory (Neurological): Lumbar facet mediated, dermatomal   Gait & Posture Assessment  Ambulation: Unassisted Gait: Relatively normal for age and body habitus Posture: WNL  Lower Extremity Exam      Side: Right lower extremity   Side: Left lower extremity  Stability: No  instability observed           Stability: No instability observed          Skin & Extremity Inspection: Skin color, temperature, and hair growth are WNL. No peripheral edema or cyanosis. No masses, redness, swelling, asymmetry, or associated skin lesions. No contractures.   Skin & Extremity Inspection: Skin color, temperature, and hair growth are WNL. No peripheral edema or cyanosis. No masses, redness, swelling, asymmetry, or associated skin lesions. No contractures.  Functional ROM: Pain restricted ROM for hip and knee joints Limited SLR (straight leg raise)   Functional ROM: Pain restricted ROM for hip and knee joints          Muscle Tone/Strength: Functionally intact. No obvious neuro-muscular anomalies detected.   Muscle Tone/Strength: Functionally intact. No obvious neuro-muscular anomalies detected.  Sensory (Neurological): Arthropathic pain pattern of knee       Sensory (Neurological): Arthropathic pain pattern of knee          DTR: Patellar: deferred today Achilles: deferred today Plantar: deferred today   DTR: Patellar: deferred today Achilles: deferred today Plantar: deferred today  Palpation: No palpable anomalies   Palpation: No palpable anomalies          Assessment   Status Diagnosis  Controlled Controlled Controlled 1. Lumbar facetogenic pain   2. Lumbar spondylosis   3. Lumbar facet arthropathy   4. Fibromyalgia   5. Chronic pain syndrome         Plan of Care    [name] has a current medication list which includes the following long-term medication(s): omeprazole and gabapentin.  1. Lumbar facetogenic pain - HYDROcodone-acetaminophen (NORCO/VICODIN) 5-325 MG tablet; Take 1 tablet by mouth every 8 (eight) hours as needed for moderate pain.  Dispense: 90 tablet; Refill: 0 - HYDROcodone-acetaminophen (NORCO/VICODIN) 5-325 MG tablet; Take 1 tablet by mouth every 8 (eight) hours as needed for moderate pain.  Dispense: 90 tablet; Refill: 0 -  HYDROcodone-acetaminophen (NORCO/VICODIN) 5-325 MG tablet; Take 1 tablet by mouth every 8 (eight) hours as needed for moderate pain.  Dispense: 90 tablet; Refill: 0 - gabapentin (NEURONTIN) 600  MG tablet; Take 1 tablet (600 mg total) by mouth at bedtime. 300 mg qday, 600 mg qhs  Dispense: 30 tablet; Refill: 5  2. Lumbar spondylosis - HYDROcodone-acetaminophen (NORCO/VICODIN) 5-325 MG tablet; Take 1 tablet by mouth every 8 (eight) hours as needed for moderate pain.  Dispense: 90 tablet; Refill: 0 - HYDROcodone-acetaminophen (NORCO/VICODIN) 5-325 MG tablet; Take 1 tablet by mouth every 8 (eight) hours as needed for moderate pain.  Dispense: 90 tablet; Refill: 0 - HYDROcodone-acetaminophen (NORCO/VICODIN) 5-325 MG tablet; Take 1 tablet by mouth every 8 (eight) hours as needed for moderate pain.  Dispense: 90 tablet; Refill: 0 - gabapentin (NEURONTIN) 600 MG tablet; Take 1 tablet (600 mg total) by mouth at bedtime. 300 mg qday, 600 mg qhs  Dispense: 30 tablet; Refill: 5  3. Lumbar facet arthropathy - HYDROcodone-acetaminophen (NORCO/VICODIN) 5-325 MG tablet; Take 1 tablet by mouth every 8 (eight) hours as needed for moderate pain.  Dispense: 90 tablet; Refill: 0 - HYDROcodone-acetaminophen (NORCO/VICODIN) 5-325 MG tablet; Take 1 tablet by mouth every 8 (eight) hours as needed for moderate pain.  Dispense: 90 tablet; Refill: 0 - HYDROcodone-acetaminophen (NORCO/VICODIN) 5-325 MG tablet; Take 1 tablet by mouth every 8 (eight) hours as needed for moderate pain.  Dispense: 90 tablet; Refill: 0 - gabapentin (NEURONTIN) 600 MG tablet; Take 1 tablet (600 mg total) by mouth at bedtime. 300 mg qday, 600 mg qhs  Dispense: 30 tablet; Refill: 5  4. Fibromyalgia - HYDROcodone-acetaminophen (NORCO/VICODIN) 5-325 MG tablet; Take 1 tablet by mouth every 8 (eight) hours as needed for moderate pain.  Dispense: 90 tablet; Refill: 0 - HYDROcodone-acetaminophen (NORCO/VICODIN) 5-325 MG tablet; Take 1 tablet by mouth every 8  (eight) hours as needed for moderate pain.  Dispense: 90 tablet; Refill: 0 - HYDROcodone-acetaminophen (NORCO/VICODIN) 5-325 MG tablet; Take 1 tablet by mouth every 8 (eight) hours as needed for moderate pain.  Dispense: 90 tablet; Refill: 0 - gabapentin (NEURONTIN) 600 MG tablet; Take 1 tablet (600 mg total) by mouth at bedtime. 300 mg qday, 600 mg qhs  Dispense: 30 tablet; Refill: 5  5. Chronic pain syndrome - HYDROcodone-acetaminophen (NORCO/VICODIN) 5-325 MG tablet; Take 1 tablet by mouth every 8 (eight) hours as needed for moderate pain.  Dispense: 90 tablet; Refill: 0 - HYDROcodone-acetaminophen (NORCO/VICODIN) 5-325 MG tablet; Take 1 tablet by mouth every 8 (eight) hours as needed for moderate pain.  Dispense: 90 tablet; Refill: 0 - HYDROcodone-acetaminophen (NORCO/VICODIN) 5-325 MG tablet; Take 1 tablet by mouth every 8 (eight) hours as needed for moderate pain.  Dispense: 90 tablet; Refill: 0 - gabapentin (NEURONTIN) 600 MG tablet; Take 1 tablet (600 mg total) by mouth at bedtime. 300 mg qday, 600 mg qhs  Dispense: 30 tablet; Refill: 5    Pharmacotherapy (Medications Ordered): Meds ordered this encounter  Medications   HYDROcodone-acetaminophen (NORCO/VICODIN) 5-325 MG tablet    Sig: Take 1 tablet by mouth every 8 (eight) hours as needed for moderate pain.    Dispense:  90 tablet    Refill:  0   HYDROcodone-acetaminophen (NORCO/VICODIN) 5-325 MG tablet    Sig: Take 1 tablet by mouth every 8 (eight) hours as needed for moderate pain.    Dispense:  90 tablet    Refill:  0   HYDROcodone-acetaminophen (NORCO/VICODIN) 5-325 MG tablet    Sig: Take 1 tablet by mouth every 8 (eight) hours as needed for moderate pain.    Dispense:  90 tablet    Refill:  0   gabapentin (NEURONTIN) 600 MG tablet  Sig: Take 1 tablet (600 mg total) by mouth at bedtime. 300 mg qday, 600 mg qhs    Dispense:  30 tablet    Refill:  5    Orders:  No orders of the defined types were placed in this  encounter.   Follow-up plan:   Return in about 3 months (around 01/03/2023) for Medication Management, in person.    Recent Visits Date Type Provider Dept  08/29/22 Office Visit Vanessa Santa, MD [facility]  08/01/22 Procedure visit Vanessa Santa, MD [facility]  Showing recent visits within past 90 days and meeting all other requirements Today's Visits Date Type Provider Dept  10/02/22 Office Visit Vanessa Santa, MD [facility]  Showing today's visits and meeting all other requirements Future Appointments Date Type Provider Dept  12/27/22 Appointment Vanessa Santa, MD [facility]  Showing future appointments within next 90 days and meeting all other requirements  I discussed the assessment and treatment plan with the patient. The patient was provided an opportunity to ask questions and all were answered. The patient agreed with the plan and demonstrated an understanding of the instructions.  Patient advised to call back or seek an in-person evaluation if the symptoms or condition worsens.  Duration of encounter: 20mnutes.  Note by: BGillis Santa MD Date: 10/02/2022; Time: 3:07 PM

## 2022-12-06 ENCOUNTER — Encounter: Payer: Self-pay | Admitting: Student in an Organized Health Care Education/Training Program

## 2022-12-07 ENCOUNTER — Telehealth: Payer: Self-pay | Admitting: Student in an Organized Health Care Education/Training Program

## 2022-12-07 NOTE — Telephone Encounter (Signed)
Patient asks if someone can call her pharmacy so she can fill her script on 23 since they are closed on 24 and 25. [phone] call patient

## 2022-12-27 ENCOUNTER — Ambulatory Visit
Payer: BC Managed Care – PPO | Attending: Student in an Organized Health Care Education/Training Program | Admitting: Student in an Organized Health Care Education/Training Program

## 2022-12-27 ENCOUNTER — Encounter: Payer: Self-pay | Admitting: Student in an Organized Health Care Education/Training Program

## 2022-12-27 VITALS — BP 158/94 | HR 98 | Temp 97.2°F | Ht 68.0 in | Wt 200.0 lb

## 2022-12-27 DIAGNOSIS — G894 Chronic pain syndrome: Secondary | ICD-10-CM | POA: Diagnosis present

## 2022-12-27 DIAGNOSIS — M5416 Radiculopathy, lumbar region: Secondary | ICD-10-CM | POA: Insufficient documentation

## 2022-12-27 DIAGNOSIS — M170 Bilateral primary osteoarthritis of knee: Secondary | ICD-10-CM | POA: Diagnosis not present

## 2022-12-27 DIAGNOSIS — M47816 Spondylosis without myelopathy or radiculopathy, lumbar region: Secondary | ICD-10-CM | POA: Diagnosis present

## 2022-12-27 DIAGNOSIS — M797 Fibromyalgia: Secondary | ICD-10-CM | POA: Diagnosis present

## 2022-12-27 MED ORDER — HYDROCODONE-ACETAMINOPHEN 5-325 MG PO TABS: 1.0000 | ORAL_TABLET | Freq: Three times a day (TID) | ORAL | 0 refills | Status: AC | PRN

## 2022-12-27 MED ORDER — HYDROCODONE-ACETAMINOPHEN 5-325 MG PO TABS: 1.0000 | ORAL_TABLET | Freq: Three times a day (TID) | ORAL | 0 refills | Status: DC | PRN

## 2022-12-27 NOTE — Progress Notes (Signed)
Nursing Pain Medication Assessment:  Safety precautions to be maintained throughout the outpatient stay will include: orient to surroundings, keep bed in low position, maintain call bell within reach at all times, provide assistance with transfer out of bed and ambulation.  Medication Inspection Compliance: Pill count conducted under aseptic conditions, in front of the patient. Neither the pills nor the bottle was removed from the patient's sight at any time. Once count was completed pills were immediately returned to the patient in their original bottle.  Medication: Hydrocodone/APAP Pill/Patch Count:  50 of 90 pills remain Pill/Patch Appearance: Markings consistent with prescribed medication Bottle Appearance: Standard pharmacy container. Clearly labeled. Filled Date: 85 / 23 / 2023 Last Medication intake:  TodaySafety precautions to be maintained throughout the outpatient stay will include: orient to surroundings, keep bed in low position, maintain call bell within reach at all times, provide assistance with transfer out of bed and ambulation.

## 2022-12-27 NOTE — Progress Notes (Signed)
PROVIDER NOTE: Information contained herein reflects review and annotations entered in association with encounter. Interpretation of such information and data should be left to medically-trained personnel. Information provided to patient can be located elsewhere in the medical record under "Patient Instructions". Document created using STT-dictation technology, any transcriptional errors that may result from process are unintentional.    Patient: [name]  Service Category: E/M  Provider: Gillis Santa, MD  DOB: 07/26/1961  DOS: 12/27/2022  Specialty: Interventional Pain Management  MRN: [id number]  Setting: Ambulatory outpatient  PCP: Vanessa Regal, NP  Type: Established Patient    Referring Provider: Marval Regal, NP  Location: Office  Delivery: Face-to-face     HPI  [name], a 62 y.o. year old female, is here today because of her Bilateral primary osteoarthritis of knee [M17.0]. [name] primary complain today is Knee Pain (both)  Last encounter: My last encounter with her was on 10/02/22  Pertinent problems: [name] has Obesity; Arthritis of wrist; Chronic pain syndrome; Lumbar facet arthropathy; Bilateral primary osteoarthritis of knee; Bilateral hip pain; Chronic SI joint pain; Lumbar radiculopathy; Long-term current use of opiate analgesic; Right rotator cuff tear arthropathy; Chronic right shoulder pain; Fibromyalgia; and Cervical radicular pain (left) on their pertinent problem list. Pain Assessment: Severity of Chronic pain is reported as a 7 /10. Location: Knee Left, Right/pain radiaties everywhere. Onset: More than a month ago. Quality: Burning, Aching, Numbness, Stabbing, Throbbing. Timing: Constant. Modifying factor(s): Meds, knee support, heat. Vitals:  height is '5\' 8"'$  (1.727 m) and weight is 200 lb (90.7 kg). Her temperature is 97.2 F (36.2 C) (abnormal). Her blood pressure is 158/94 (abnormal) and her pulse is 98. Her oxygen saturation is 99%.    Reason for encounter:   [name] presents today for medication management.  She is complaining of low back pain & bilateral knee pain. Utilizing a heating pad every day at work.  Her low back pain is related to lumbar facet arthropathy and lumbar spondylosis.  She is status post bilateral L3, L4, L5 lumbar facet medial branch nerve block #1 on 10/19/2020 that provided her with 70% pain relief for 6 to 8 months.  Given increased axial low back pain with facet loading, recommend repeating diagnostic block #2 and then considering lumbar radiofrequency ablation of lumbar medial branch nerves.  In regards to her bilateral knee pain, patient is status post bilateral genicular nerve block.  Her first 1 was done 01/17/2022 followed by her second 1 which was done 08/29/2022 that provided her with 100% pain relief for 3 weeks, and that reduced to 85% for approximately 6 months.  Given return of bilateral knee pain related to knee osteoarthritis, we discussed genicular nerve radiofrequency ablation.  Risk and benefits reviewed and patient would like to proceed.  We will also refill her hydrocodone as below.  No change in dose.  UDS up-to-date and appropriate.    Pharmacotherapy Assessment  Analgesic: hydrocodone 5 mg every 8 hours as needed, quantity 90/month    Monitoring: [place] PMP: PDMP reviewed during this encounter.       Pharmacotherapy: No side-effects or adverse reactions reported. Compliance: No problems identified. Effectiveness: Clinically acceptable.  Vanessa Fischer, RN  12/27/2022  8:53 AM  Sign when Signing Visit Nursing Pain Medication Assessment:  Safety precautions to be maintained throughout the outpatient stay will include: orient to surroundings, keep bed in low position, maintain call bell within reach at all times, provide assistance with transfer out of bed  and ambulation.  Medication Inspection Compliance: Pill count conducted under aseptic conditions, in front of the patient.  Neither the pills nor the bottle was removed from the patient's sight at any time. Once count was completed pills were immediately returned to the patient in their original bottle.  Medication: Hydrocodone/APAP Pill/Patch Count:  50 of 90 pills remain Pill/Patch Appearance: Markings consistent with prescribed medication Bottle Appearance: Standard pharmacy container. Clearly labeled. Filled Date: 14 / 23 / 2023 Last Medication intake:  TodaySafety precautions to be maintained throughout the outpatient stay will include: orient to surroundings, keep bed in low position, maintain call bell within reach at all times, provide assistance with transfer out of bed and ambulation.      UDS:  Summary  Date Value Ref Range Status  07/03/2022 Note  Final    Comment:    ==================================================================== [facility] (MW) ==================================================================== Test                             Result       Flag       Units  Drug Present and Declared for Prescription Verification   Hydrocodone                    607          EXPECTED   ng/mg creat   Hydromorphone                  99           EXPECTED   ng/mg creat   Dihydrocodeine                 140          EXPECTED   ng/mg creat   Norhydrocodone                 729          EXPECTED   ng/mg creat    Sources of hydrocodone include scheduled prescription medications.    Hydromorphone, dihydrocodeine and norhydrocodone are expected    metabolites of hydrocodone. Hydromorphone and dihydrocodeine are    also available as scheduled prescription medications.  ==================================================================== Test                      Result    Flag   Units      Ref Range   Creatinine              97               mg/dL      >=20 ==================================================================== Declared Medications:  The flagging and interpretation on this  report are based on the  following declared medications.  Unexpected results may arise from  inaccuracies in the declared medications.   **Note: The testing scope of this panel includes these medications:   Hydrocodone (Norco)   **Note: The testing scope of this panel does not include the  following reported medications:   Acetaminophen (Norco)  Gabapentin (Neurontin)  Melatonin  Multivitamin  Omeprazole (Prilosec)  Valacyclovir (Valtrex) ==================================================================== For clinical consultation, please call [phone]. ====================================================================      ROS  Constitutional: Denies any fever or chills Gastrointestinal: No reported hemesis, hematochezia, vomiting, or acute GI distress Musculoskeletal: Axial low back pain, pain with facet loading.  Bilateral knee pain worse with weightbearing Neurological: No reported episodes of acute onset apraxia, aphasia, dysarthria,  agnosia, amnesia, paralysis, loss of coordination, or loss of consciousness  Medication Review  HYDROcodone-acetaminophen, Melatonin, gabapentin, multivitamin, omeprazole, and valACYclovir  History Review  Allergy: Ms. Delisi has No Known Allergies. Drug: Ms. Senat  reports no history of drug use. Alcohol:  reports that she does not currently use alcohol. Tobacco:  reports that she quit smoking about 19 years ago. Her smoking use included cigarettes. She has never used smokeless tobacco. Social: Ms. Furnari  reports that she quit smoking about 19 years ago. Her smoking use included cigarettes. She has never used smokeless tobacco. She reports that she does not currently use alcohol. She reports that she does not use drugs. Medical:  has a past medical history of Arthritis, Degenerative disorder of bone, Genuine stress incontinence, female (2012), GERD (gastroesophageal reflux disease), Migraines, and Obesity. Surgical: Ms. Wandel  has a  past surgical history that includes Laparoscopic hysterectomy (10/23/2010); Midurethral sling (05/02/2011); and excision of vulvar lesion (05/02/2011). Family: family history includes Arthritis in her maternal grandmother and mother; Cancer in her father and paternal grandfather; Diabetes in her son; Heart attack in her maternal grandfather, mother, and paternal grandmother; Hypertension in her mother.  Laboratory Chemistry Profile   Renal Lab Results  Component Value Date   BUN 10 02/24/2021   CREATININE 0.83 02/24/2021   BCR 12 06/16/2018   GFR 74.95 02/09/2021   GFRAA 92 06/16/2018   GFRNONAA >60 02/24/2021    Hepatic Lab Results  Component Value Date   AST 18 02/01/2021   ALT 18 02/01/2021   ALBUMIN 4.1 02/01/2021   ALKPHOS 81 02/01/2021    Electrolytes Lab Results  Component Value Date   NA 138 02/24/2021   K 3.9 02/24/2021   CL 107 02/24/2021   CALCIUM 9.8 02/24/2021   MG 1.8 01/30/2019    Bone Lab Results  Component Value Date   VD25OH 41.01 10/28/2020    Inflammation (CRP: Acute Phase) (ESR: Chronic Phase) No results found for: "CRP", "ESRSEDRATE", "LATICACIDVEN"       Note: Above Lab results reviewed.   Physical Exam  Huynh appearance: Well nourished, well developed, and well hydrated. In no apparent acute distress Mental status: Alert, oriented x 3 (person, place, & time)       Respiratory: No evidence of acute respiratory distress Eyes: PERLA Vitals: BP (!) 158/94   Pulse 98   Temp (!) 97.2 F (36.2 C)   Ht '5\' 8"'$  (1.727 m)   Wt 200 lb (90.7 kg)   SpO2 99%   BMI 30.41 kg/m  BMI: Estimated body mass index is 30.41 kg/m as calculated from the following:   Height as of this encounter: '5\' 8"'$  (1.727 m).   Weight as of this encounter: 200 lb (90.7 kg). Ideal: Ideal body weight: 63.9 kg (140 lb 14 oz) Adjusted ideal body weight: 74.6 kg (164 lb 8.4 oz)      Lumbar Spine Area Exam  Skin & Axial Inspection: No masses, redness, or  swelling Alignment: Symmetrical Functional ROM: Pain restricted ROM right greater than left       Stability: No instability detected Muscle Tone/Strength: Functionally intact. No obvious neuro-muscular anomalies detected. Sensory (Neurological): Lumbar facet mediated Positive pain with lumbar extension and facet loading   Gait & Posture Assessment  Ambulation: Unassisted Gait: Relatively normal for age and body habitus Posture: WNL  Lower Extremity Exam      Side: Right lower extremity   Side: Left lower extremity  Stability: No instability observed  Stability: No instability observed          Skin & Extremity Inspection: Skin color, temperature, and hair growth are WNL. No peripheral edema or cyanosis. No masses, redness, swelling, asymmetry, or associated skin lesions. No contractures.   Skin & Extremity Inspection: Skin color, temperature, and hair growth are WNL. No peripheral edema or cyanosis. No masses, redness, swelling, asymmetry, or associated skin lesions. No contractures.  Functional ROM: Pain restricted ROM for hip and knee joints Limited SLR (straight leg raise)   Functional ROM: Pain restricted ROM for hip and knee joints          Muscle Tone/Strength: Functionally intact. No obvious neuro-muscular anomalies detected.   Muscle Tone/Strength: Functionally intact. No obvious neuro-muscular anomalies detected.  Sensory (Neurological): Arthropathic pain pattern of knee       Sensory (Neurological): Arthropathic pain pattern of knee          DTR: Patellar: deferred today Achilles: deferred today Plantar: deferred today   DTR: Patellar: deferred today Achilles: deferred today Plantar: deferred today  Palpation: No palpable anomalies   Palpation: No palpable anomalies          Assessment   Status Diagnosis  Worsening Worsening Worsening 1. Bilateral primary osteoarthritis of knee   2. Lumbar spondylosis   3. Lumbar facet arthropathy   4. Lumbar facetogenic  pain   5. Chronic pain syndrome   6. Fibromyalgia          Plan of Care    [name] has a current medication list which includes the following long-term medication(s): gabapentin and omeprazole.  1. Bilateral primary osteoarthritis of knee - Radiofrequency,Genicular; Future  2. Lumbar spondylosis - LUMBAR FACET(MEDIAL BRANCH NERVE BLOCK) MBNB; Future - HYDROcodone-acetaminophen (NORCO/VICODIN) 5-325 MG tablet; Take 1 tablet by mouth every 8 (eight) hours as needed for moderate pain.  Dispense: 90 tablet; Refill: 0 - HYDROcodone-acetaminophen (NORCO/VICODIN) 5-325 MG tablet; Take 1 tablet by mouth every 8 (eight) hours as needed for moderate pain.  Dispense: 90 tablet; Refill: 0 - HYDROcodone-acetaminophen (NORCO/VICODIN) 5-325 MG tablet; Take 1 tablet by mouth every 8 (eight) hours as needed for moderate pain.  Dispense: 90 tablet; Refill: 0  3. Lumbar facet arthropathy - LUMBAR FACET(MEDIAL BRANCH NERVE BLOCK) MBNB; Future - HYDROcodone-acetaminophen (NORCO/VICODIN) 5-325 MG tablet; Take 1 tablet by mouth every 8 (eight) hours as needed for moderate pain.  Dispense: 90 tablet; Refill: 0 - HYDROcodone-acetaminophen (NORCO/VICODIN) 5-325 MG tablet; Take 1 tablet by mouth every 8 (eight) hours as needed for moderate pain.  Dispense: 90 tablet; Refill: 0 - HYDROcodone-acetaminophen (NORCO/VICODIN) 5-325 MG tablet; Take 1 tablet by mouth every 8 (eight) hours as needed for moderate pain.  Dispense: 90 tablet; Refill: 0  4. Lumbar facetogenic pain - LUMBAR FACET(MEDIAL BRANCH NERVE BLOCK) MBNB; Future - HYDROcodone-acetaminophen (NORCO/VICODIN) 5-325 MG tablet; Take 1 tablet by mouth every 8 (eight) hours as needed for moderate pain.  Dispense: 90 tablet; Refill: 0 - HYDROcodone-acetaminophen (NORCO/VICODIN) 5-325 MG tablet; Take 1 tablet by mouth every 8 (eight) hours as needed for moderate pain.  Dispense: 90 tablet; Refill: 0 - HYDROcodone-acetaminophen (NORCO/VICODIN)  5-325 MG tablet; Take 1 tablet by mouth every 8 (eight) hours as needed for moderate pain.  Dispense: 90 tablet; Refill: 0  5. Chronic pain syndrome - Radiofrequency,Genicular; Future - LUMBAR FACET(MEDIAL BRANCH NERVE BLOCK) MBNB; Future - HYDROcodone-acetaminophen (NORCO/VICODIN) 5-325 MG tablet; Take 1 tablet by mouth every 8 (eight) hours as needed for moderate pain.  Dispense: 90 tablet; Refill:  0 - HYDROcodone-acetaminophen (NORCO/VICODIN) 5-325 MG tablet; Take 1 tablet by mouth every 8 (eight) hours as needed for moderate pain.  Dispense: 90 tablet; Refill: 0 - HYDROcodone-acetaminophen (NORCO/VICODIN) 5-325 MG tablet; Take 1 tablet by mouth every 8 (eight) hours as needed for moderate pain.  Dispense: 90 tablet; Refill: 0  6. Fibromyalgia - HYDROcodone-acetaminophen (NORCO/VICODIN) 5-325 MG tablet; Take 1 tablet by mouth every 8 (eight) hours as needed for moderate pain.  Dispense: 90 tablet; Refill: 0 - HYDROcodone-acetaminophen (NORCO/VICODIN) 5-325 MG tablet; Take 1 tablet by mouth every 8 (eight) hours as needed for moderate pain.  Dispense: 90 tablet; Refill: 0 - HYDROcodone-acetaminophen (NORCO/VICODIN) 5-325 MG tablet; Take 1 tablet by mouth every 8 (eight) hours as needed for moderate pain.  Dispense: 90 tablet; Refill: 0  Left knee genicular RFA FIRST Right knee genicular RFA 2 weeks after  B/L L3,4,5 MBNB #2, 2 weeks after  (All with IV Versed in clinic)  Pharmacotherapy (Medications Ordered): Meds ordered this encounter  Medications   HYDROcodone-acetaminophen (NORCO/VICODIN) 5-325 MG tablet    Sig: Take 1 tablet by mouth every 8 (eight) hours as needed for moderate pain.    Dispense:  90 tablet    Refill:  0   HYDROcodone-acetaminophen (NORCO/VICODIN) 5-325 MG tablet    Sig: Take 1 tablet by mouth every 8 (eight) hours as needed for moderate pain.    Dispense:  90 tablet    Refill:  0   HYDROcodone-acetaminophen (NORCO/VICODIN) 5-325 MG tablet    Sig: Take 1 tablet  by mouth every 8 (eight) hours as needed for moderate pain.    Dispense:  90 tablet    Refill:  0    Orders:  Orders Placed This Encounter  Procedures   Radiofrequency,Genicular    Standing Status:   Future    Standing Expiration Date:   03/28/2023    Scheduling Instructions:     Side(s): Bilateral Knee     Level(s): Superior-Lateral, Superior-Medial, and Inferior-Medial Genicular Nerve(s)     Sedation: IV Versed     Scheduling Timeframe: As soon as pre-approved    Order Specific Question:   Where will this procedure be performed?    Answer:   [facility]   LUMBAR FACET(MEDIAL BRANCH NERVE BLOCK) MBNB    Standing Status:   Future    Standing Expiration Date:   03/28/2023    Scheduling Instructions:     Procedure: Lumbar facet block (AKA.: Lumbosacral medial branch nerve block)     Side: Bilateral     Level: L3-4 & L5-S1 Facets (L3, L4, L5,Medial Branch Nerves)     Sedation:with IV Versed     Timeframe: ASAA    Order Specific Question:   Where will this procedure be performed?    Answer:   [facility]    Follow-up plan:   Return in about 3 months (around 03/28/2023) for Medication Management, in person.    Recent Visits Date Type Provider Dept  10/02/22 Office Visit Vanessa Santa, MD [facility]  Showing recent visits within past 90 days and meeting all other requirements Today's Visits Date Type Provider Dept  12/27/22 Office Visit Vanessa Santa, MD [facility]  Showing today's visits and meeting all other requirements Future Appointments Date Type Provider Dept  03/19/23 Appointment Vanessa Santa, MD [facility]  Showing future appointments within next 90 days and meeting all other requirements  I discussed the assessment and treatment plan with the patient. The patient was provided  an opportunity to ask questions and all were answered. The patient agreed with the plan and demonstrated an understanding of the  instructions.  Patient advised to call back or seek an in-person evaluation if the symptoms or condition worsens.  Duration of encounter: 61mnutes.  Note by: BGillis Santa MD Date: 12/27/2022; Time: 10:04 AM

## 2023-01-01 ENCOUNTER — Telehealth: Payer: Self-pay

## 2023-01-01 NOTE — Telephone Encounter (Signed)
Insurance denied the genicular RFA's. They state there is no scientific proof that they will benefit her, therefore it is considered investigational,  and the health plan does not cover investigational procedures. Do you want me to proceed with trying to get auth for the lumbar facets?

## 2023-03-06 ENCOUNTER — Encounter: Payer: Self-pay | Admitting: Student in an Organized Health Care Education/Training Program

## 2023-03-19 ENCOUNTER — Encounter: Payer: Self-pay | Admitting: Student in an Organized Health Care Education/Training Program

## 2023-03-19 ENCOUNTER — Ambulatory Visit
Payer: BC Managed Care – PPO | Attending: Student in an Organized Health Care Education/Training Program | Admitting: Student in an Organized Health Care Education/Training Program

## 2023-03-19 VITALS — BP 148/92 | HR 89 | Temp 97.6°F | Resp 18 | Ht 68.0 in | Wt 200.0 lb

## 2023-03-19 DIAGNOSIS — M170 Bilateral primary osteoarthritis of knee: Secondary | ICD-10-CM | POA: Diagnosis not present

## 2023-03-19 DIAGNOSIS — M5416 Radiculopathy, lumbar region: Secondary | ICD-10-CM | POA: Diagnosis not present

## 2023-03-19 DIAGNOSIS — M797 Fibromyalgia: Secondary | ICD-10-CM | POA: Diagnosis present

## 2023-03-19 DIAGNOSIS — M180 Bilateral primary osteoarthritis of first carpometacarpal joints: Secondary | ICD-10-CM

## 2023-03-19 DIAGNOSIS — M47816 Spondylosis without myelopathy or radiculopathy, lumbar region: Secondary | ICD-10-CM | POA: Diagnosis not present

## 2023-03-19 DIAGNOSIS — G894 Chronic pain syndrome: Secondary | ICD-10-CM | POA: Insufficient documentation

## 2023-03-19 HISTORY — DX: Bilateral primary osteoarthritis of first carpometacarpal joints: M18.0

## 2023-03-19 MED ORDER — HYDROCODONE-ACETAMINOPHEN 7.5-325 MG PO TABS: 1.0000 | ORAL_TABLET | Freq: Three times a day (TID) | ORAL | 0 refills | Status: AC | PRN

## 2023-03-19 MED ORDER — GABAPENTIN 600 MG PO TABS: 600.0000 mg | ORAL_TABLET | Freq: Every day | ORAL | 5 refills | Status: DC

## 2023-03-19 MED ORDER — KETOROLAC TROMETHAMINE 30 MG/ML IJ SOLN
30.0000 mg | Freq: Once | INTRAMUSCULAR | Status: AC
  Administered 2023-03-19: 30 mg via INTRAMUSCULAR

## 2023-03-19 MED ORDER — KETOROLAC TROMETHAMINE 30 MG/ML IJ SOLN
INTRAMUSCULAR | Status: AC
  Filled 2023-03-19: qty 1

## 2023-03-19 NOTE — Patient Instructions (Addendum)
GENERAL RISKS AND COMPLICATIONS  What are the risk, side effects and possible complications? Generally speaking, most procedures are safe.  However, with any procedure there are risks, side effects, and the possibility of complications.  The risks and complications are dependent upon the sites that are lesioned, or the type of nerve block to be performed.  The closer the procedure is to the spine, the more serious the risks are.  Great care is taken when placing the radio frequency needles, block needles or lesioning probes, but sometimes complications can occur. Infection: Any time there is an injection through the skin, there is a risk of infection.  This is why sterile conditions are used for these blocks.  There are four possible types of infection. Localized skin infection. Central Nervous System Infection-This can be in the form of Meningitis, which can be deadly. Epidural Infections-This can be in the form of an epidural abscess, which can cause pressure inside of the spine, causing compression of the spinal cord with subsequent paralysis. This would require an emergency surgery to decompress, and there are no guarantees that the patient would recover from the paralysis. Discitis-This is an infection of the intervertebral discs.  It occurs in about 1% of discography procedures.  It is difficult to treat and it may lead to surgery.        2. Pain: the needles have to go through skin and soft tissues, will cause soreness.       3. Damage to internal structures:  The nerves to be lesioned may be near blood vessels or    other nerves which can be potentially damaged.       4. Bleeding: Bleeding is more common if the patient is taking blood thinners such as  aspirin, Coumadin, Ticiid, Plavix, etc., or if he/she have some genetic predisposition  such as hemophilia. Bleeding into the spinal canal can cause compression of the spinal  cord with subsequent paralysis.  This would require an emergency  surgery to  decompress and there are no guarantees that the patient would recover from the  paralysis.       5. Pneumothorax:  Puncturing of a lung is a possibility, every time a needle is introduced in  the area of the chest or upper back.  Pneumothorax refers to free air around the  collapsed lung(s), inside of the thoracic cavity (chest cavity).  Another two possible  complications related to a similar event would include: Hemothorax and Chylothorax.   These are variations of the Pneumothorax, where instead of air around the collapsed  lung(s), you may have blood or chyle, respectively.       6. Spinal headaches: They may occur with any procedures in the area of the spine.       7. Persistent CSF (Cerebro-Spinal Fluid) leakage: This is a rare problem, but may occur  with prolonged intrathecal or epidural catheters either due to the formation of a fistulous  track or a dural tear.       8. Nerve damage: By working so close to the spinal cord, there is always a possibility of  nerve damage, which could be as serious as a permanent spinal cord injury with  paralysis.       9. Death:  Although rare, severe deadly allergic reactions known as "Anaphylactic  reaction" can occur to any of the medications used.      10. Worsening of the symptoms:  We can always make thing worse.  What are the  chances of something like this happening? Chances of any of this occuring are extremely low.  By statistics, you have more of a chance of getting killed in a motor vehicle accident: while driving to the hospital than any of the above occurring .  Nevertheless, you should be aware that they are possibilities.  In general, it is similar to taking a shower.  Everybody knows that you can slip, hit your head and get killed.  Does that mean that you should not shower again?  Nevertheless always keep in mind that statistics do not mean anything if you happen to be on the wrong side of them.  Even if a procedure has a 1 (one) in a  1,000,000 (million) chance of going wrong, it you happen to be that one..Also, keep in mind that by statistics, you have more of a chance of having something go wrong when taking medications.  Who should not have this procedure? If you are on a blood thinning medication (e.g. Coumadin, Plavix, see list of "Blood Thinners"), or if you have an active infection going on, you should not have the procedure.  If you are taking any blood thinners, please inform your physician.  How should I prepare for this procedure? Do not eat or drink anything at least six hours prior to the procedure. Bring a driver with you .  It cannot be a taxi. Come accompanied by an adult that can drive you back, and that is strong enough to help you if your legs get weak or numb from the local anesthetic. Take all of your medicines the morning of the procedure with just enough water to swallow them. If you have diabetes, make sure that you are scheduled to have your procedure done first thing in the morning, whenever possible. If you have diabetes, take only half of your insulin dose and notify our nurse that you have done so as soon as you arrive at the clinic. If you are diabetic, but only take blood sugar pills (oral hypoglycemic), then do not take them on the morning of your procedure.  You may take them after you have had the procedure. Do not take aspirin or any aspirin-containing medications, at least eleven (11) days prior to the procedure.  They may prolong bleeding. Wear loose fitting clothing that may be easy to take off and that you would not mind if it got stained with Betadine or blood. Do not wear any jewelry or perfume Remove any nail coloring.  It will interfere with some of our monitoring equipment.  NOTE: Remember that this is not meant to be interpreted as a complete list of all possible complications.  Unforeseen problems may occur.  BLOOD THINNERS The following drugs contain aspirin or other products,  which can cause increased bleeding during surgery and should not be taken for 2 weeks prior to and 1 week after surgery.  If you should need take something for relief of minor pain, you may take acetaminophen which is found in Tylenol,m Datril, Anacin-3 and Panadol. It is not blood thinner. The products listed below are.  Do not take any of the products listed below in addition to any listed on your instruction sheet.  A.P.C or A.P.C with Codeine Codeine Phosphate Capsules #3 Ibuprofen Ridaura  ABC compound Congesprin Imuran rimadil  Advil Cope Indocin Robaxisal  Alka-Seltzer Effervescent Pain Reliever and Antacid Coricidin or Coricidin-D  Indomethacin Rufen  Alka-Seltzer plus Cold Medicine Cosprin Ketoprofen S-A-C Tablets  Anacin Analgesic Tablets or Capsules  Coumadin Korlgesic Salflex  Anacin Extra Strength Analgesic tablets or capsules CP-2 Tablets Lanoril Salicylate  Anaprox Cuprimine Capsules Levenox Salocol  Anexsia-D Dalteparin Magan Salsalate  Anodynos Darvon compound Magnesium Salicylate Sine-off  Ansaid Dasin Capsules Magsal Sodium Salicylate  Anturane Depen Capsules Marnal Soma  APF Arthritis pain formula Dewitt's Pills Measurin Stanback  Argesic Dia-Gesic Meclofenamic Sulfinpyrazone  Arthritis Bayer Timed Release Aspirin Diclofenac Meclomen Sulindac  Arthritis pain formula Anacin Dicumarol Medipren Supac  Analgesic (Safety coated) Arthralgen Diffunasal Mefanamic Suprofen  Arthritis Strength Bufferin Dihydrocodeine Mepro Compound Suprol  Arthropan liquid Dopirydamole Methcarbomol with Aspirin Synalgos  ASA tablets/Enseals Disalcid Micrainin Tagament  Ascriptin Doan's Midol Talwin  Ascriptin A/D Dolene Mobidin Tanderil  Ascriptin Extra Strength Dolobid Moblgesic Ticlid  Ascriptin with Codeine Doloprin or Doloprin with Codeine Momentum Tolectin  Asperbuf Duoprin Mono-gesic Trendar  Aspergum Duradyne Motrin or Motrin IB Triminicin  Aspirin plain, buffered or enteric coated  Durasal Myochrisine Trigesic  Aspirin Suppositories Easprin Nalfon Trillsate  Aspirin with Codeine Ecotrin Regular or Extra Strength Naprosyn Uracel  Atromid-S Efficin Naproxen Ursinus  Auranofin Capsules Elmiron Neocylate Vanquish  Axotal Emagrin Norgesic Verin  Azathioprine Empirin or Empirin with Codeine Normiflo Vitamin E  Azolid Emprazil Nuprin Voltaren  Bayer Aspirin plain, buffered or children's or timed BC Tablets or powders Encaprin Orgaran Warfarin Sodium  Buff-a-Comp Enoxaparin Orudis Zorpin  Buff-a-Comp with Codeine Equegesic Os-Cal-Gesic   Buffaprin Excedrin plain, buffered or Extra Strength Oxalid   Bufferin Arthritis Strength Feldene Oxphenbutazone   Bufferin plain or Extra Strength Feldene Capsules Oxycodone with Aspirin   Bufferin with Codeine Fenoprofen Fenoprofen Pabalate or Pabalate-SF   Buffets II Flogesic Panagesic   Buffinol plain or Extra Strength Florinal or Florinal with Codeine Panwarfarin   Buf-Tabs Flurbiprofen Penicillamine   Butalbital Compound Four-way cold tablets Penicillin   Butazolidin Fragmin Pepto-Bismol   Carbenicillin Geminisyn Percodan   Carna Arthritis Reliever Geopen Persantine   Carprofen Gold's salt Persistin   Chloramphenicol Goody's Phenylbutazone   Chloromycetin Haltrain Piroxlcam   Clmetidine heparin Plaquenil   Cllnoril Hyco-pap Ponstel   Clofibrate Hydroxy chloroquine Propoxyphen         Before stopping any of these medications, be sure to consult the physician who ordered them.  Some, such as Coumadin (Warfarin) are ordered to prevent or treat serious conditions such as "deep thrombosis", "pumonary embolisms", and other heart problems.  The amount of time that you may need off of the medication may also vary with the medication and the reason for which you were taking it.  If you are taking any of these medications, please make sure you notify your pain physician before you undergo any procedures.         Knee Injection A  knee injection is a procedure to get medicine into your knee joint to relieve the pain, swelling, and stiffness of arthritis. Your health care provider uses a needle to inject medicine, which may also help to lubricate and cushion your knee joint. You may need more than one injection. Tell a health care provider about: Any allergies you have. All medicines you are taking, including vitamins, herbs, eye drops, creams, and over-the-counter medicines. Any problems you or family members have had with anesthetic medicines. Any blood disorders you have. Any surgeries you have had. Any medical conditions you have. Whether you are pregnant or may be pregnant. What are the risks? Generally, this is a safe procedure. However, problems may occur, including: Infection. Bleeding. Symptoms that get worse. Damage to the area around your knee.  Allergic reaction to any of the medicines. Skin reactions from repeated injections. What happens before the procedure? Ask your health care provider about: Changing or stopping your regular medicines. This is especially important if you are taking diabetes medicines or blood thinners. Taking medicines such as aspirin and ibuprofen. These medicines can thin your blood. Do not take these medicines unless your health care provider tells you to take them. Taking over-the-counter medicines, vitamins, herbs, and supplements. Plan to have a responsible adult take you home from the hospital or clinic. What happens during the procedure?  You will sit or lie down in a position for your knee to be treated. The skin over your kneecap will be cleaned with a germ-killing soap. You will be given a medicine that numbs the area (local anesthetic). You may feel some stinging. The medicine will be injected into your knee. The needle is carefully placed between your kneecap and your knee. The medicine is injected into the joint space. The needle will be removed at the end of the  procedure. A bandage (dressing) may be placed over the injection site. The procedure may vary among health care providers and hospitals. What can I expect after the procedure? Your blood pressure, heart rate, breathing rate, and blood oxygen level will be monitored until you leave the hospital or clinic. You may have to move your knee through its full range of motion. This helps to get all the medicine into your joint space. You will be watched to make sure that you do not have a reaction to the injected medicine. You may feel more pain, swelling, and warmth than you did before the injection. This reaction may last about 1-2 days. Follow these instructions at home: Medicines Take over-the-counter and prescription medicines only as told by your health care provider. Ask your health care provider if the medicine prescribed to you requires you to avoid driving or using machinery. Do not take medicines such as aspirin and ibuprofen unless your health care provider tells you to take them. Injection site care Follow instructions from your health care provider about: How to take care of your puncture site. When and how you should change your dressing. When you should remove your dressing. Check your injection area every day for signs of infection. Check for: More redness, swelling, or pain after 2 days. Fluid or blood. Pus or a bad smell. Warmth. Managing pain, stiffness, and swelling  If directed, put ice on the injection area. To do this: Put ice in a plastic bag. Place a towel between your skin and the bag. Leave the ice on for 20 minutes, 2-3 times per day. Remove the ice if your skin turns bright red. This is very important. If you cannot feel pain, heat, or cold, you have a greater risk of damage to the area. Do not apply heat to your knee. Raise (elevate) the injection area above the level of your heart while you are sitting or lying down. General instructions If you were given a  dressing, keep it dry until your health care provider says it can be removed. Ask your health care provider when you can start showering or bathing. Avoid strenuous activities for as long as directed by your health care provider. Ask your health care provider when you can return to your normal activities. Keep all follow-up visits. This is important. You may need more injections. Contact a health care provider if you have: A fever. Warmth in your injection area. Fluid, blood, or pus  coming from your injection site. Symptoms at your injection site that last longer than 2 days after your procedure. Get help right away if: Your knee turns very red. Your knee becomes very swollen. Your knee is in severe pain. Summary A knee injection is a procedure to get medicine into your knee joint to relieve the pain, swelling, and stiffness of arthritis. A needle is carefully placed between your kneecap and your knee to inject medicine into the joint space. Before the procedure, ask your health care provider about changing or stopping your regular medicines, especially if you are taking diabetes medicines or blood thinners. Contact your health care provider if you have any problems or questions after your procedure. This information is not intended to replace advice given to you by your health care provider. Make sure you discuss any questions you have with your health care provider. Document Revised: 05/18/2020 Document Reviewed: 05/18/2020 Elsevier Patient Education  [place]. Facet Blocks Patient Information  Description: The facets are joints in the spine between the vertebrae.  Like any joints in the body, facets can become irritated and painful.  Arthritis can also effect the facets.  By injecting steroids and local anesthetic in and around these joints, we can temporarily block the nerve supply to them.  Steroids act directly on irritated nerves and tissues to reduce selling and inflammation  which often leads to decreased pain.  Facet blocks may be done anywhere along the spine from the neck to the low back depending upon the location of your pain.   After numbing the skin with local anesthetic (like Novocaine), a small needle is passed onto the facet joints under x-ray guidance.  You may experience a sensation of pressure while this is being done.  The entire block usually lasts about 15-25 minutes.   Conditions which may be treated by facet blocks:  Low back/buttock pain Neck/shoulder pain Certain types of headaches  Preparation for the injection:  Do not eat any solid food or dairy products within 8 hours of your appointment. You may drink clear liquid up to 3 hours before appointment.  Clear liquids include water, black coffee, juice or soda.  No milk or cream please. You may take your regular medication, including pain medications, with a sip of water before your appointment.  Diabetics should hold regular insulin (if taken separately) and take 1/2 normal NPH dose the morning of the procedure.  Carry some sugar containing items with you to your appointment. A driver must accompany you and be prepared to drive you home after your procedure. Bring all your current medications with you. An IV may be inserted and sedation may be given at the discretion of the physician. A blood pressure cuff, EKG and other monitors will often be applied during the procedure.  Some patients may need to have extra oxygen administered for a short period. You will be asked to provide medical information, including your allergies and medications, prior to the procedure.  We must know immediately if you are taking blood thinners (like Coumadin/Warfarin) or if you are allergic to IV iodine contrast (dye).  We must know if you could possible be pregnant.  Possible side-effects:  Bleeding from needle site Infection (rare, may require surgery) Nerve injury (rare) Numbness & tingling  (temporary) Difficulty urinating (rare, temporary) Spinal headache (a headache worse with upright posture) Light-headedness (temporary) Pain at injection site (serveral days) Decreased blood pressure (rare, temporary) Weakness in arm/leg (temporary) Pressure sensation in back/neck (temporary)  Call if you experience:  Fever/chills associated with headache or increased back/neck pain Headache worsened by an upright position New onset, weakness or numbness of an extremity below the injection site Hives or difficulty breathing (go to the emergency room) Inflammation or drainage at the injection site(s) Severe back/neck pain greater than usual New symptoms which are concerning to you  Please note:  Although the local anesthetic injected can often make your back or neck feel good for several hours after the injection, the pain will likely return. It takes 3-7 days for steroids to work.  You may not notice any pain relief for at least one week.  If effective, we will often do a series of 2-3 injections spaced 3-6 weeks apart to maximally decrease your pain.  After the initial series, you may be a candidate for a more permanent nerve block of the facets.  If you have any questions, please call #[phone]) [place] pm on 4/17 for B/L knee steroid and CMC injection  Followed by B/L GNB and lumbar facets in May with Po Valium  MM F2F prior to 7/10

## 2023-03-19 NOTE — Progress Notes (Signed)
Nursing Pain Medication Assessment:  Safety precautions to be maintained throughout the outpatient stay will include: orient to surroundings, keep bed in low position, maintain call bell within reach at all times, provide assistance with transfer out of bed and ambulation.  Medication Inspection Compliance: Pill count conducted under aseptic conditions, in front of the patient. Neither the pills nor the bottle was removed from the patient's sight at any time. Once count was completed pills were immediately returned to the patient in their original bottle.  Medication: Hydrocodone/APAP Pill/Patch Count:  66 of 90 pills remain Pill/Patch Appearance: Markings consistent with prescribed medication Bottle Appearance: Standard pharmacy container. Clearly labeled. Filled Date: 03 / 22 / 2024 Last Medication intake:  Yesterday

## 2023-03-19 NOTE — Progress Notes (Signed)
PROVIDER NOTE: Information contained herein reflects review and annotations entered in association with encounter. Interpretation of such information and data should be left to medically-trained personnel. Information provided to patient can be located elsewhere in the medical record under "Patient Instructions". Document created using STT-dictation technology, any transcriptional errors that may result from process are unintentional.    Patient: [name]  Service Category: E/M  Provider: Gillis Santa, MD  DOB: Jul 14, 1961  DOS: 03/19/2023  Specialty: Interventional Pain Management  MRN: [id number]  Setting: Ambulatory outpatient  PCP: Marval Regal, NP  Type: Established Patient    Referring Provider: Marval Regal, NP  Location: Office  Delivery: Face-to-face     HPI  [name], a 62 y.o. year old female, is here today because of her Bilateral primary osteoarthritis of knee [M17.0]. Ms. Swarr primary complain today is Joint Pain (Knees bilat are worse)  Last encounter: My last encounter with her was on 12/27/22  Pertinent problems: Ms. Haralson has Obesity; Arthritis of wrist; Chronic pain syndrome; Lumbar spondylosis; Bilateral primary osteoarthritis of knee; Bilateral hip pain; Chronic SI joint pain; Lumbar radiculopathy; Long-term current use of opiate analgesic; Right rotator cuff tear arthropathy; Chronic right shoulder pain; Fibromyalgia; and Cervical radicular pain (left) on their pertinent problem list. Pain Assessment: Severity of Chronic pain is reported as a 6 /10. Location: Other (Comment) (generalized joint)  / . Onset: More than a month ago. Quality: Aching, Sharp, Shooting. Timing: Constant. Modifying factor(s): meds. Vitals:  height is 5\' 8"  (1.727 m) and weight is 200 lb (90.7 kg). Her temporal temperature is 97.6 F (36.4 C). Her blood pressure is 148/92 (abnormal) and her pulse is 89. Her respiration is 18 and oxygen saturation is 99%.   Reason for  encounter:   -Patient is endorsing increased bilateral knee which is getting worse. She's had 2 previous genicular nerve blocks in the past as detailed below which provided significant pain relief. Unfortunately her genicular nerve RFA was denied. We discussed repeating a GNB and even a bilateral IA knee steroid injection to help manage her severe knee pain which is causing severe disability.  -She has completed a physician directed home exercise program and continues to do home stretching and ROM exercises for her low back. She is status post bilateral L3, L4, L5 lumbar facet medial branch nerve block #1 on 10/19/2020 that provided her with 70% pain relief for 6 to 8 months.  Given increased axial low back pain with facet loading, recommend repeating diagnostic block #2 and then considering lumbar radiofrequency ablation of lumbar medial branch nerves.  -Increased bilateral CMC pain, likely related to [facility] arthritis. Discussed support brace and small joint injection.  -Temporary increase in Hydrocodone from 5--> 7.5 mg TID prn given increased bilateral knee pain and low back pain as detailed above.    Visit from 12/27/22:  Cicilia presents today for medication management.  She is complaining of low back pain & bilateral knee pain. Utilizing a heating pad every day at work.  Her low back pain is related to lumbar facet arthropathy and lumbar spondylosis.  She is status post bilateral L3, L4, L5 lumbar facet medial branch nerve block #1 on 10/19/2020 that provided her with 70% pain relief for 6 to 8 months.  Given increased axial low back pain with facet loading, recommend repeating diagnostic block #2 and then considering lumbar radiofrequency ablation of lumbar medial branch nerves.  In regards to her bilateral knee pain, patient is status post  bilateral genicular nerve block.  Her first 1 was done 01/17/2022 followed by her second 1 which was done 08/29/2022 that provided her with 100% pain relief for 3  weeks, and that reduced to 85% for approximately 6 months.  Given return of bilateral knee pain related to knee osteoarthritis, we discussed genicular nerve radiofrequency ablation.  Risk and benefits reviewed and patient would like to proceed.  We will also refill her hydrocodone as below.  No change in dose.  UDS up-to-date and appropriate.    Pharmacotherapy Assessment  Analgesic: hydrocodone 7.5 mg every 8 hours as needed, quantity 90/month    Monitoring: [place] PMP: PDMP reviewed during this encounter.       Pharmacotherapy: No side-effects or adverse reactions reported. Compliance: No problems identified. Effectiveness: Clinically acceptable.  [name], RN  03/19/2023  8:49 AM  Sign when Signing Visit Nursing Pain Medication Assessment:  Safety precautions to be maintained throughout the outpatient stay will include: orient to surroundings, keep bed in low position, maintain call bell within reach at all times, provide assistance with transfer out of bed and ambulation.  Medication Inspection Compliance: Pill count conducted under aseptic conditions, in front of the patient. Neither the pills nor the bottle was removed from the patient's sight at any time. Once count was completed pills were immediately returned to the patient in their original bottle.  Medication: Hydrocodone/APAP Pill/Patch Count:  66 of 90 pills remain Pill/Patch Appearance: Markings consistent with prescribed medication Bottle Appearance: Standard pharmacy container. Clearly labeled. Filled Date: 03 / 22 / 2024 Last Medication intake:  Yesterday     UDS:  Summary  Date Value Ref Range Status  07/03/2022 Note  Final    Comment:    ==================================================================== [employer] Select 13 (MW) ==================================================================== Test                             Result       Flag       Units  Drug Present and Declared for Prescription  Verification   Hydrocodone                    607          EXPECTED   ng/mg creat   Hydromorphone                  99           EXPECTED   ng/mg creat   Dihydrocodeine                 140          EXPECTED   ng/mg creat   Norhydrocodone                 729          EXPECTED   ng/mg creat    Sources of hydrocodone include scheduled prescription medications.    Hydromorphone, dihydrocodeine and norhydrocodone are expected    metabolites of hydrocodone. Hydromorphone and dihydrocodeine are    also available as scheduled prescription medications.  ==================================================================== Test                      Result    Flag   Units      Ref Range   Creatinine              97  mg/dL      >=20 ==================================================================== Declared Medications:  The flagging and interpretation on this report are based on the  following declared medications.  Unexpected results may arise from  inaccuracies in the declared medications.   **Note: The testing scope of this panel includes these medications:   Hydrocodone (Norco)   **Note: The testing scope of this panel does not include the  following reported medications:   Acetaminophen (Norco)  Gabapentin (Neurontin)  Melatonin  Multivitamin  Omeprazole (Prilosec)  Valacyclovir (Valtrex) ==================================================================== For clinical consultation, please call [phone]. ====================================================================      ROS  Constitutional: Denies any fever or chills Gastrointestinal: No reported hemesis, hematochezia, vomiting, or acute GI distress Musculoskeletal: Axial low back pain, pain with facet loading.  Bilateral knee pain worse with weightbearing, bilateral CMC pain Neurological: No reported episodes of acute onset apraxia, aphasia, dysarthria, agnosia, amnesia, paralysis, loss of coordination,  or loss of consciousness  Medication Review  HYDROcodone-acetaminophen, Melatonin, gabapentin, multivitamin, omeprazole, and valACYclovir  History Review  Allergy: Ms. Ziomek has No Known Allergies. Drug: Ms. Eldredge  reports no history of drug use. Alcohol:  reports that she does not currently use alcohol. Tobacco:  reports that she quit smoking about 20 years ago. Her smoking use included cigarettes. She has never used smokeless tobacco. Social: Ms. Angeli  reports that she quit smoking about 20 years ago. Her smoking use included cigarettes. She has never used smokeless tobacco. She reports that she does not currently use alcohol. She reports that she does not use drugs. Medical:  has a past medical history of Arthritis, Degenerative disorder of bone, Genuine stress incontinence, female (2012), GERD (gastroesophageal reflux disease), Migraines, and Obesity. Surgical: Ms. Mehrtens  has a past surgical history that includes Laparoscopic hysterectomy (10/23/2010); Midurethral sling (05/02/2011); and excision of vulvar lesion (05/02/2011). Family: family history includes Arthritis in her maternal grandmother and mother; Cancer in her father and paternal grandfather; Diabetes in her son; Heart attack in her maternal grandfather, mother, and paternal grandmother; Hypertension in her mother.  Laboratory Chemistry Profile   Renal Lab Results  Component Value Date   BUN 10 02/24/2021   CREATININE 0.83 02/24/2021   BCR 12 06/16/2018   GFR 74.95 02/09/2021   GFRAA 92 06/16/2018   GFRNONAA >60 02/24/2021    Hepatic Lab Results  Component Value Date   AST 18 02/01/2021   ALT 18 02/01/2021   ALBUMIN 4.1 02/01/2021   ALKPHOS 81 02/01/2021    Electrolytes Lab Results  Component Value Date   NA 138 02/24/2021   K 3.9 02/24/2021   CL 107 02/24/2021   CALCIUM 9.8 02/24/2021   MG 1.8 01/30/2019    Bone Lab Results  Component Value Date   VD25OH 41.01 10/28/2020    Inflammation (CRP: Acute  Phase) (ESR: Chronic Phase) No results found for: "CRP", "ESRSEDRATE", "LATICACIDVEN"       Note: Above Lab results reviewed.   Physical Exam  General appearance: Well nourished, well developed, and well hydrated. In no apparent acute distress Mental status: Alert, oriented x 3 (person, place, & time)       Respiratory: No evidence of acute respiratory distress Eyes: PERLA Vitals: BP (!) 148/92   Pulse 89   Temp 97.6 F (36.4 C) (Temporal)   Resp 18   Ht 5\' 8"  (1.727 m)   Wt 200 lb (90.7 kg)   SpO2 99%   BMI 30.41 kg/m  BMI: Estimated body mass index is 30.41 kg/m as calculated from  the following:   Height as of this encounter: 5\' 8"  (1.727 m).   Weight as of this encounter: 200 lb (90.7 kg). Ideal: Ideal body weight: 63.9 kg (140 lb 14 oz) Adjusted ideal body weight: 74.6 kg (164 lb 8.4 oz)  Bilateral CMC pain, tender to palpation    Lumbar Spine Area Exam  Skin & Axial Inspection: No masses, redness, or swelling Alignment: Symmetrical Functional ROM: Pain restricted ROM right greater than left       Stability: No instability detected Muscle Tone/Strength: Functionally intact. No obvious neuro-muscular anomalies detected. Sensory (Neurological): Lumbar facet mediated Positive pain with lumbar extension and facet loading   Gait & Posture Assessment  Ambulation: Unassisted Gait: Relatively normal for age and body habitus Posture: WNL  Lower Extremity Exam      Side: Right lower extremity   Side: Left lower extremity  Stability: No instability observed           Stability: No instability observed          Skin & Extremity Inspection: Skin color, temperature, and hair growth are WNL. No peripheral edema or cyanosis. No masses, redness, swelling, asymmetry, or associated skin lesions. No contractures.   Skin & Extremity Inspection: Skin color, temperature, and hair growth are WNL. No peripheral edema or cyanosis. No masses, redness, swelling, asymmetry, or associated skin  lesions. No contractures.  Functional ROM: Pain restricted ROM for hip and knee joints Limited SLR (straight leg raise)   Functional ROM: Pain restricted ROM for hip and knee joints          Muscle Tone/Strength: Functionally intact. No obvious neuro-muscular anomalies detected.   Muscle Tone/Strength: Functionally intact. No obvious neuro-muscular anomalies detected.  Sensory (Neurological): Arthropathic pain pattern of knee       Sensory (Neurological): Arthropathic pain pattern of knee          DTR: Patellar: deferred today Achilles: deferred today Plantar: deferred today   DTR: Patellar: deferred today Achilles: deferred today Plantar: deferred today  Palpation: No palpable anomalies   Palpation: No palpable anomalies          Assessment   Status Diagnosis  Worsening Worsening Worsening 1. Bilateral primary osteoarthritis of knee   2. Lumbar spondylosis   3. Lumbar facet arthropathy   4. Lumbar facetogenic pain   5. Arthritis of carpometacarpal (CMC) joint of both thumbs   6. Chronic pain syndrome   7. Fibromyalgia           Plan of Care    [name] has a current medication list which includes the following long-term medication(s): omeprazole and gabapentin.  1. Bilateral primary osteoarthritis of knee - KNEE INJECTION; Future - GENICULAR NERVE BLOCK; Future - ketorolac (TORADOL) 30 MG/ML injection 30 mg  2. Lumbar spondylosis - gabapentin (NEURONTIN) 600 MG tablet; Take 1 tablet (600 mg total) by mouth at bedtime. 300 mg qday, 600 mg qhs  Dispense: 30 tablet; Refill: 5 - LUMBAR FACET(MEDIAL BRANCH NERVE BLOCK) MBNB; Future  3. Lumbar facet arthropathy - gabapentin (NEURONTIN) 600 MG tablet; Take 1 tablet (600 mg total) by mouth at bedtime. 300 mg qday, 600 mg qhs  Dispense: 30 tablet; Refill: 5 - LUMBAR FACET(MEDIAL BRANCH NERVE BLOCK) MBNB; Future  4. Lumbar facetogenic pain - gabapentin (NEURONTIN) 600 MG tablet; Take 1 tablet (600 mg total)  by mouth at bedtime. 300 mg qday, 600 mg qhs  Dispense: 30 tablet; Refill: 5 - LUMBAR FACET(MEDIAL BRANCH NERVE BLOCK) MBNB; Future  5. Arthritis of carpometacarpal (CMC) joint of both thumbs - Small Joint Injection/Arthrocentesis; Future  6. Chronic pain syndrome - KNEE INJECTION; Future - GENICULAR NERVE BLOCK; Future - Small Joint Injection/Arthrocentesis; Future - HYDROcodone-acetaminophen (NORCO) 7.5-325 MG tablet; Take 1 tablet by mouth every 8 (eight) hours as needed for severe pain. Must last 30 days.  Dispense: 90 tablet; Refill: 0 - HYDROcodone-acetaminophen (NORCO) 7.5-325 MG tablet; Take 1 tablet by mouth every 8 (eight) hours as needed for severe pain. Must last 30 days.  Dispense: 90 tablet; Refill: 0 - HYDROcodone-acetaminophen (NORCO) 7.5-325 MG tablet; Take 1 tablet by mouth every 8 (eight) hours as needed for severe pain. Must last 30 days.  Dispense: 90 tablet; Refill: 0 - gabapentin (NEURONTIN) 600 MG tablet; Take 1 tablet (600 mg total) by mouth at bedtime. 300 mg qday, 600 mg qhs  Dispense: 30 tablet; Refill: 5 - LUMBAR FACET(MEDIAL BRANCH NERVE BLOCK) MBNB; Future  7. Fibromyalgia - gabapentin (NEURONTIN) 600 MG tablet; Take 1 tablet (600 mg total) by mouth at bedtime. 300 mg qday, 600 mg qhs  Dispense: 30 tablet; Refill: 5   Pharmacotherapy (Medications Ordered): Meds ordered this encounter  Medications   HYDROcodone-acetaminophen (NORCO) 7.5-325 MG tablet    Sig: Take 1 tablet by mouth every 8 (eight) hours as needed for severe pain. Must last 30 days.    Dispense:  90 tablet    Refill:  0    Chronic Pain: STOP Act (Not applicable) Fill 1 day early if closed on refill date. Avoid benzodiazepines within 8 hours of opioids   HYDROcodone-acetaminophen (NORCO) 7.5-325 MG tablet    Sig: Take 1 tablet by mouth every 8 (eight) hours as needed for severe pain. Must last 30 days.    Dispense:  90 tablet    Refill:  0    Chronic Pain: STOP Act (Not applicable) Fill 1  day early if closed on refill date. Avoid benzodiazepines within 8 hours of opioids   HYDROcodone-acetaminophen (NORCO) 7.5-325 MG tablet    Sig: Take 1 tablet by mouth every 8 (eight) hours as needed for severe pain. Must last 30 days.    Dispense:  90 tablet    Refill:  0    Chronic Pain: STOP Act (Not applicable) Fill 1 day early if closed on refill date. Avoid benzodiazepines within 8 hours of opioids   gabapentin (NEURONTIN) 600 MG tablet    Sig: Take 1 tablet (600 mg total) by mouth at bedtime. 300 mg qday, 600 mg qhs    Dispense:  30 tablet    Refill:  5   ketorolac (TORADOL) 30 MG/ML injection 30 mg    Orders:  Orders Placed This Encounter  Procedures   KNEE INJECTION    Local Anesthetic & Steroid injection.    Standing Status:   Future    Standing Expiration Date:   06/18/2023    Scheduling Instructions:     Side: Bilateral     Sedation: None     Timeframe: As soon as schedule allows    Order Specific Question:   Where will this procedure be performed?    Answer:   [facility]   GENICULAR NERVE BLOCK    Indication(s):  Sub-acute knee pain    Standing Status:   Future    Standing Expiration Date:   06/18/2023    Scheduling Instructions:     Side: Bilateral     Sedation: Patient's choice.     Timeframe: As soon as schedule allows  Order Specific Question:   Where will this procedure be performed?    Answer:   [facility]   Small Joint Injection/Arthrocentesis    Standing Status:   Future    Standing Expiration Date:   06/18/2023    Scheduling Instructions:     Bilateral CMC injection   LUMBAR FACET(MEDIAL BRANCH NERVE BLOCK) MBNB    Standing Status:   Future    Standing Expiration Date:   06/18/2023    Scheduling Instructions:     Procedure: Lumbar facet block (AKA.: Lumbosacral medial branch nerve block)     Side: Bilateral     Level: L3-4, L4-5, Facets ( L3, L4, L5, Medial Branch)     Sedation: Po Valium     Timeframe: ASAA    Order Specific  Question:   Where will this procedure be performed?    Answer:   [facility]    Follow-up plan:   Return in about 15 days (around 04/03/2023) for B/L knee steriod + B/L CMC injection.    Recent Visits Date Type Provider Dept  12/27/22 Office Visit Gillis Santa, MD [facility]  Showing recent visits within past 90 days and meeting all other requirements Today's Visits Date Type Provider Dept  03/19/23 Office Visit Gillis Santa, MD [facility]  Showing today's visits and meeting all other requirements Future Appointments No visits were found meeting these conditions. Showing future appointments within next 90 days and meeting all other requirements  I discussed the assessment and treatment plan with the patient. The patient was provided an opportunity to ask questions and all were answered. The patient agreed with the plan and demonstrated an understanding of the instructions.  Patient advised to call back or seek an in-person evaluation if the symptoms or condition worsens.  Duration of encounter: 3minutes.  Note by: Gillis Santa, MD Date: 03/19/2023; Time: 9:56 AM

## 2023-03-27 ENCOUNTER — Telehealth: Payer: Self-pay

## 2023-03-27 NOTE — Telephone Encounter (Signed)
My chart message sent regarding scheduled appt.

## 2023-04-03 ENCOUNTER — Ambulatory Visit
Payer: BC Managed Care – PPO | Attending: Student in an Organized Health Care Education/Training Program | Admitting: Student in an Organized Health Care Education/Training Program

## 2023-04-03 ENCOUNTER — Ambulatory Visit
Admission: RE | Admit: 2023-04-03 | Discharge: 2023-04-03 | Disposition: A | Discharge status: Home / Self Care | Payer: BC Managed Care – PPO | Source: Ambulatory Visit | Attending: Student in an Organized Health Care Education/Training Program | Admitting: Student in an Organized Health Care Education/Training Program

## 2023-04-03 ENCOUNTER — Encounter: Payer: Self-pay | Admitting: Student in an Organized Health Care Education/Training Program

## 2023-04-03 DIAGNOSIS — M47816 Spondylosis without myelopathy or radiculopathy, lumbar region: Secondary | ICD-10-CM | POA: Insufficient documentation

## 2023-04-03 DIAGNOSIS — M5416 Radiculopathy, lumbar region: Secondary | ICD-10-CM | POA: Diagnosis present

## 2023-04-03 DIAGNOSIS — G894 Chronic pain syndrome: Secondary | ICD-10-CM | POA: Diagnosis present

## 2023-04-03 MED ORDER — DEXAMETHASONE SODIUM PHOSPHATE 10 MG/ML IJ SOLN
10.0000 mg | Freq: Once | INTRAMUSCULAR | Status: AC
  Administered 2023-04-03: 10 mg
  Filled 2023-04-03: qty 1

## 2023-04-03 MED ORDER — MIDAZOLAM HCL 2 MG/2ML IJ SOLN: 0.5000 mg | Freq: Once | INTRAMUSCULAR | Status: DC

## 2023-04-03 MED ORDER — LIDOCAINE HCL 2 % IJ SOLN
20.0000 mL | Freq: Once | INTRAMUSCULAR | Status: AC
  Administered 2023-04-03: 400 mg
  Filled 2023-04-03: qty 20

## 2023-04-03 MED ORDER — LACTATED RINGERS IV SOLN: Freq: Once | INTRAVENOUS | Status: DC

## 2023-04-03 MED ORDER — ROPIVACAINE HCL 2 MG/ML IJ SOLN: 9.0000 mL | Freq: Once | INTRAMUSCULAR | Status: DC

## 2023-04-03 MED ORDER — ROPIVACAINE HCL 2 MG/ML IJ SOLN
9.0000 mL | Freq: Once | INTRAMUSCULAR | Status: DC
  Filled 2023-04-03: qty 20

## 2023-04-03 NOTE — Patient Instructions (Signed)

## 2023-04-03 NOTE — Progress Notes (Signed)
Safety precautions to be maintained throughout the outpatient stay will include: orient to surroundings, keep bed in low position, maintain call bell within reach at all times, provide assistance with transfer out of bed and ambulation.  

## 2023-04-03 NOTE — Progress Notes (Signed)
PROVIDER NOTE: Information contained herein reflects review and annotations entered in association with encounter. Interpretation of such information and data should be left to medically-trained personnel. Information provided to patient can be located elsewhere in the medical record under "Patient Instructions". Document created using STT-dictation technology, any transcriptional errors that may result from process are unintentional.    Patient: [name]  Service Category: Procedure  Provider: Edward Jolly, MD  DOB: [birth year]  DOS: 04/03/2023  Location: [facility]  MRN: [id number]  Setting: Ambulatory - outpatient  Referring Provider: Edward Jolly, MD  Type: Established Patient  Specialty: Interventional Pain Management  PCP: Theadore Nan, NP   Primary Reason for Visit: Interventional Pain Management Treatment. CC: Back Pain (lower)  Procedure:          Anesthesia, Analgesia, Anxiolysis:  Type: Lumbar Facet, Medial Branch Block(s) #2  Primary Purpose: Diagnostic Region: Posterolateral Lumbosacral Spine Level:L3, L4, L5, Medial Branch Level(s). Injecting these levels blocks the L3-4, L4-5, lumbar facet joints. Laterality: Bilateral  Type: Local Anesthesia  Sedation: Meaningful verbal contact was maintained at all times during the procedure  Local Anesthetic: Lidocaine 1-2%  Position: Prone   Indications: 1. Lumbar spondylosis   2. Lumbar facet arthropathy   3. Lumbar facetogenic pain   4. Chronic pain syndrome    Pain Score: Pre-procedure: 8 /10 Post-procedure: 0-No pain/10   Pre-op Assessment:  [name] is a 62 y.o. (year old), female patient, seen today for interventional treatment. She  has a past surgical history that includes Laparoscopic hysterectomy (10/23/2010); Midurethral sling (05/02/2011); and excision of vulvar lesion (05/02/2011). [name] has a current medication list which includes the following prescription(s): gabapentin,  hydrocodone-acetaminophen, [START ON 04/27/2023] hydrocodone-acetaminophen, [START ON 05/27/2023] hydrocodone-acetaminophen, melatonin, multivitamin, omeprazole, and valacyclovir, and the following Facility-Administered Medications: lactated ringers, midazolam, ropivacaine (pf) 2 mg/ml (0.2%), and ropivacaine (pf) 2 mg/ml (0.2%). Her primarily concern today is the Back Pain (lower)  Initial Vital Signs:  Pulse/HCG Rate: 74ECG Heart Rate: 74 Temp: (!) 97.5 F (36.4 C) Resp: 15 BP: 135/85 SpO2: 99 %  BMI: Estimated body mass index is 30.41 kg/m as calculated from the following:   Height as of this encounter: 5\' 8"  (1.727 m).   Weight as of this encounter: 200 lb (90.7 kg).  Risk Assessment: Allergies: Reviewed. She has No Known Allergies.  Allergy Precautions: None required Coagulopathies: Reviewed. None identified.  Blood-thinner therapy: None at this time Active Infection(s): Reviewed. None identified. [name] is afebrile  Site Confirmation: [name] was asked to confirm the procedure and laterality before marking the site Procedure checklist: Completed Consent: Before the procedure and under the influence of no sedative(s), amnesic(s), or anxiolytics, the patient was informed of the treatment options, risks and possible complications. To fulfill our ethical and legal obligations, as recommended by the American Medical Association's Code of Ethics, I have informed the patient of my clinical impression; the nature and purpose of the treatment or procedure; the risks, benefits, and possible complications of the intervention; the alternatives, including doing nothing; the risk(s) and benefit(s) of the alternative treatment(s) or procedure(s); and the risk(s) and benefit(s) of doing nothing. The patient was provided information about the general risks and possible complications associated with the procedure. These may include, but are not limited to: failure to achieve desired goals, infection,  bleeding, organ or nerve damage, allergic reactions, paralysis, and death. In addition, the patient was informed of those risks and complications associated to Spine-related procedures, such as failure to  decrease pain; infection (i.e.: Meningitis, epidural or intraspinal abscess); bleeding (i.e.: epidural hematoma, subarachnoid hemorrhage, or any other type of intraspinal or peri-dural bleeding); organ or nerve damage (i.e.: Any type of peripheral nerve, nerve root, or spinal cord injury) with subsequent damage to sensory, motor, and/or autonomic systems, resulting in permanent pain, numbness, and/or weakness of one or several areas of the body; allergic reactions; (i.e.: anaphylactic reaction); and/or death. Furthermore, the patient was informed of those risks and complications associated with the medications. These include, but are not limited to: allergic reactions (i.e.: anaphylactic or anaphylactoid reaction(s)); adrenal axis suppression; blood sugar elevation that in diabetics may result in ketoacidosis or comma; water retention that in patients with history of congestive heart failure may result in shortness of breath, pulmonary edema, and decompensation with resultant heart failure; weight gain; swelling or edema; medication-induced neural toxicity; particulate matter embolism and blood vessel occlusion with resultant organ, and/or nervous system infarction; and/or aseptic necrosis of one or more joints. Finally, the patient was informed that Medicine is not an exact science; therefore, there is also the possibility of unforeseen or unpredictable risks and/or possible complications that may result in a catastrophic outcome. The patient indicated having understood very clearly. We have given the patient no guarantees and we have made no promises. Enough time was given to the patient to ask questions, all of which were answered to the patient's satisfaction. [name] has indicated that she wanted to  continue with the procedure. Attestation: I, the ordering provider, attest that I have discussed with the patient the benefits, risks, side-effects, alternatives, likelihood of achieving goals, and potential problems during recovery for the procedure that I have provided informed consent. Date  Time: 04/03/2023  8:11 AM  Pre-Procedure Preparation:  Monitoring: As per clinic protocol. Respiration, ETCO2, SpO2, BP, heart rate and rhythm monitor placed and checked for adequate function Safety Precautions: Patient was assessed for positional comfort and pressure points before starting the procedure. Time-out: I initiated and conducted the "Time-out" before starting the procedure, as per protocol. The patient was asked to participate by confirming the accuracy of the "Time Out" information. Verification of the correct person, site, and procedure were performed and confirmed by me, the nursing staff, and the patient. "Time-out" conducted as per Joint Commission's Universal Protocol (UP.01.01.01). Time: [phone]  Description of Procedure:          Laterality: Bilateral. The procedure was performed in identical fashion on both sides. Levels:  L3, L4, L5,  Medial Branch Level(s) Area Prepped: Posterior Lumbosacral Region DuraPrep (Iodine Povacrylex [0.7% available iodine] and Isopropyl Alcohol, 74% w/w) Safety Precautions: Aspiration looking for blood return was conducted prior to all injections. At no point did we inject any substances, as a needle was being advanced. Before injecting, the patient was told to immediately notify me if she was experiencing any new onset of "ringing in the ears, or metallic taste in the mouth". No attempts were made at seeking any paresthesias. Safe injection practices and needle disposal techniques used. Medications properly checked for expiration dates. SDV (single dose vial) medications used. After the completion of the procedure, all disposable equipment used was discarded in  the proper designated medical waste containers. Local Anesthesia: Protocol guidelines were followed. The patient was positioned over the fluoroscopy table. The area was prepped in the usual manner. The time-out was completed. The target area was identified using fluoroscopy. A 12-in long, straight, sterile hemostat was used with fluoroscopic guidance to locate the targets for each level  blocked. Once located, the skin was marked with an approved surgical skin marker. Once all sites were marked, the skin (epidermis, dermis, and hypodermis), as well as deeper tissues (fat, connective tissue and muscle) were infiltrated with a small amount of a short-acting local anesthetic, loaded on a 10cc syringe with a 25G, 1.5-in  Needle. An appropriate amount of time was allowed for local anesthetics to take effect before proceeding to the next step. Local Anesthetic: Lidocaine 2.0% The unused portion of the local anesthetic was discarded in the proper designated containers. Technical explanation of process:  L3 Medial Branch Nerve Block (MBB): The target area for the L3 medial branch is at the junction of the postero-lateral aspect of the superior articular process and the superior, posterior, and medial edge of the transverse process of L4. Under fluoroscopic guidance, a Quincke needle was inserted until contact was made with os over the superior postero-lateral aspect of the pedicular shadow (target area). After negative aspiration for blood, 1.5 mL of the nerve block solution was injected without difficulty or complication. The needle was removed intact. L4 Medial Branch Nerve Block (MBB): The target area for the L4 medial branch is at the junction of the postero-lateral aspect of the superior articular process and the superior, posterior, and medial edge of the transverse process of L5. Under fluoroscopic guidance, a Quincke needle was inserted until contact was made with os over the superior postero-lateral aspect of  the pedicular shadow (target area). After negative aspiration for blood, 1.5 mL of the nerve block solution was injected without difficulty or complication. The needle was removed intact. L5 Medial Branch Nerve Block (MBB): The target area for the L5 medial branch is at the junction of the postero-lateral aspect of the superior articular process and the superior, posterior, and medial edge of the sacral ala. Under fluoroscopic guidance, a Quincke needle was inserted until contact was made with os over the superior postero-lateral aspect of the pedicular shadow (target area). After negative aspiration for blood,1.5 mL of the nerve block solution was injected without difficulty or complication. The needle was removed intact.  Nerve block solution: 10 cc solution made of 8 cc of 0.2% ropivacaine, 2 cc of Decadron 10 mg/cc.  1.5 cc injected at each level above bilaterally.  The unused portion of the solution was discarded in the proper designated containers. Procedural Needles: 22-gauge, 3.5-inch, Quincke needles used for all levels.  Once the entire procedure was completed, the treated area was cleaned, making sure to leave some of the prepping solution back to take advantage of its long term bactericidal properties.   Illustration of the posterior view of the lumbar spine and the posterior neural structures. Laminae of L2 through S1 are labeled. DPRL5, dorsal primary ramus of L5; DPRS1, dorsal primary ramus of S1; DPR3, dorsal primary ramus of L3; FJ, facet (zygapophyseal) joint L3-L4; I, inferior articular process of L4; LB1, lateral branch of dorsal primary ramus of L1; IAB, inferior articular branches from L3 medial branch (supplies L4-L5 facet joint); IBP, intermediate branch plexus; MB3, medial branch of dorsal primary ramus of L3; NR3, third lumbar nerve root; S, superior articular process of L5; SAB, superior articular branches from L4 (supplies L4-5 facet joint also); TP3, transverse process of  L3.  Vitals:   04/03/23 0847 04/03/23 0849 04/03/23 0854 04/03/23 0856  BP: (!) 141/98 (!) 161/107 (!) 140/96 (!) 157/84  Pulse:      Resp: Temp:      TempSrc:  SpO2: 98% 98% 99% 99%  Weight:      Height:         Start Time: 0846 hrs. End Time: 0855 hrs.  Imaging Guidance (Spinal):          Type of Imaging Technique: Fluoroscopy Guidance (Spinal) Indication(s): Assistance in needle guidance and placement for procedures requiring needle placement in or near specific anatomical locations not easily accessible without such assistance. Exposure Time: Please see nurses notes. Contrast: None used. Fluoroscopic Guidance: I was personally present during the use of fluoroscopy. "Tunnel Vision Technique" used to obtain the best possible view of the target area. Parallax error corrected before commencing the procedure. "Direction-depth-direction" technique used to introduce the needle under continuous pulsed fluoroscopy. Once target was reached, antero-posterior, oblique, and lateral fluoroscopic projection used confirm needle placement in all planes. Images permanently stored in EMR. Interpretation: No contrast injected. I personally interpreted the imaging intraoperatively. Adequate needle placement confirmed in multiple planes. Permanent images saved into the patient's record.  Antibiotic Prophylaxis:   Anti-infectives (From admission, onward)    None      Indication(s): None identified  Post-operative Assessment:  Post-procedure Vital Signs:  Pulse/HCG Rate: 7463 Temp: (!) 97.5 F (36.4 C) Resp: 15 BP: (!) 157/84 SpO2: 99 %  EBL: None  Complications: No immediate post-treatment complications observed by team, or reported by patient.  Note: The patient tolerated the entire procedure well. A repeat set of vitals were taken after the procedure and the patient was kept under observation following institutional policy, for this type of procedure. Post-procedural  neurological assessment was performed, showing return to baseline, prior to discharge. The patient was provided with post-procedure discharge instructions, including a section on how to identify potential problems. Should any problems arise concerning this procedure, the patient was given instructions to immediately contact us, at any time, without hesitation. In any case, we plan to contact the patient by telephone for a follow-up status report regarding this interventional procedure.  Comments:  No additional relevant information.  Plan of Care  Orders:  Orders Placed This Encounter  Procedures   DG PAIN [facility] C-ARM 1-60 MIN NO REPORT    Intraoperative interpretation by procedural physician at [facility].    Standing Status:   Standing    Number of Occurrences:   1    Order Specific Question:   Reason for exam:    Answer:   Assistance in needle guidance and placement for procedures requiring needle placement in or near specific anatomical locations not easily accessible without such assistance.   Medications ordered for procedure: Meds ordered this encounter  Medications   lidocaine (XYLOCAINE) 2 % (with pres) injection 400 mg   lactated ringers infusion   midazolam (VERSED) injection 0.5-2 mg    Make sure Flumazenil is available in the pyxis when using this medication. If oversedation occurs, administer 0.2 mg IV over 15 sec. If after 45 sec no response, administer 0.2 mg again over 1 min; may repeat at 1 min intervals; not to exceed 4 doses (1 mg)   ropivacaine (PF) 2 mg/mL (0.2%) (NAROPIN) injection 9 mL   ropivacaine (PF) 2 mg/mL (0.2%) (NAROPIN) injection 9 mL   dexamethasone (DECADRON) injection 10 mg   dexamethasone (DECADRON) injection 10 mg   Medications administered: We administered lidocaine, dexamethasone, and dexamethasone.  See the medical record for exact dosing, route, and time of administration.  Follow-up plan:   Return in about 4 weeks (around  05/01/2023) for Post Procedure Evaluation, virtual.  Recent Visits Date Type Provider Dept  03/19/23 Office Visit Edward Jolly, MD [facility]  Showing recent visits within past 90 days and meeting all other requirements Today's Visits Date Type Provider Dept  04/03/23 Procedure visit Edward Jolly, MD [facility]  Showing today's visits and meeting all other requirements Future Appointments Date Type Provider Dept  05/01/23 Appointment Edward Jolly, MD [facility]  06/25/23 Appointment Edward Jolly, MD [facility]  Showing future appointments within next 90 days and meeting all other requirements  Disposition: Discharge home  Discharge (Date  Time): 04/03/2023; 0903 hrs.   Primary Care Physician: Theadore Nan, NP Location: [facility] Note by: Edward Jolly, MD Date: 04/03/2023; Time: 10:21 AM  Disclaimer:  Medicine is not an exact science. The only guarantee in medicine is that nothing is guaranteed. It is important to note that the decision to proceed with this intervention was based on the information collected from the patient. The Data and conclusions were drawn from the patient's questionnaire, the interview, and the physical examination. Because the information was provided in large part by the patient, it cannot be guaranteed that it has not been purposely or unconsciously manipulated. Every effort has been made to obtain as much relevant data as possible for this evaluation. It is important to note that the conclusions that lead to this procedure are derived in large part from the available data. Always take into account that the treatment will also be dependent on availability of resources and existing treatment guidelines, considered by other Pain Management Practitioners as being common knowledge and practice, at the time of the intervention. For Medico-Legal purposes, it is also important to point out  that variation in procedural techniques and pharmacological choices are the acceptable norm. The indications, contraindications, technique, and results of the above procedure should only be interpreted and judged by a Board-Certified Interventional Pain Specialist with extensive familiarity and expertise in the same exact procedure and technique.

## 2023-04-04 ENCOUNTER — Telehealth: Payer: Self-pay | Admitting: *Deleted

## 2023-04-04 NOTE — Telephone Encounter (Signed)
Called for post procedure check. Denies any issues except for a headache last night that has improved this AM. No other stmptoms reported.

## 2023-04-16 ENCOUNTER — Telehealth: Payer: Self-pay

## 2023-04-16 NOTE — Telephone Encounter (Signed)
Her insurance considers genicular nerve blocks investigational and will not approve. Left patient vm letting her know.

## 2023-04-30 ENCOUNTER — Telehealth: Payer: Self-pay

## 2023-04-30 NOTE — Telephone Encounter (Signed)
Attempt to call for virtual visit pre-question. LVM.   Earlyne Iba, RN

## 2023-05-01 ENCOUNTER — Telehealth: Payer: Self-pay | Admitting: Student in an Organized Health Care Education/Training Program

## 2023-05-01 ENCOUNTER — Encounter: Payer: Self-pay | Admitting: Student in an Organized Health Care Education/Training Program

## 2023-05-01 ENCOUNTER — Ambulatory Visit
Payer: BC Managed Care – PPO | Attending: Student in an Organized Health Care Education/Training Program | Admitting: Student in an Organized Health Care Education/Training Program

## 2023-05-01 DIAGNOSIS — M5416 Radiculopathy, lumbar region: Secondary | ICD-10-CM | POA: Diagnosis not present

## 2023-05-01 DIAGNOSIS — M170 Bilateral primary osteoarthritis of knee: Secondary | ICD-10-CM | POA: Diagnosis not present

## 2023-05-01 DIAGNOSIS — G894 Chronic pain syndrome: Secondary | ICD-10-CM

## 2023-05-01 DIAGNOSIS — M47816 Spondylosis without myelopathy or radiculopathy, lumbar region: Secondary | ICD-10-CM | POA: Diagnosis not present

## 2023-05-01 NOTE — Telephone Encounter (Signed)
PT was returning call back to nurse, PT has vv appt today.

## 2023-05-01 NOTE — Progress Notes (Signed)
Patient: [name]  Service Category: E/M  Provider: Edward Jolly, MD  DOB: 01/20/1961  DOS: 05/01/2023  Location: Office  MRN: [id number]  Setting: Ambulatory outpatient  Referring Provider: Theadore Nan, NP  Type: Established Patient  Specialty: Interventional Pain Management  PCP: Theadore Nan, NP  Location: Remote location  Delivery: TeleHealth     Virtual Encounter - Pain Management PROVIDER NOTE: Information contained herein reflects review and annotations entered in association with encounter. Interpretation of such information and data should be left to medically-trained personnel. Information provided to patient can be located elsewhere in the medical record under "Patient Instructions". Document created using STT-dictation technology, any transcriptional errors that may result from process are unintentional.    Contact & Pharmacy Preferred: [phone] Home: [phone] (home) Mobile: [phone] (mobile) E-mail: [email or web address]  [facility] - [place], [place] - [address] [place] [address] Phone: [phone] Fax: [phone]  [id number] - [place] [facility] - [address] [place] [address] Phone: [phone] Fax: [phone]   Pre-screening  Ms. Marvin offered "in-person" vs "virtual" encounter. She indicated preferring virtual for this encounter.   Reason COVID-19*  Social distancing based on CDC and AMA recommendations.   I contacted [name] on 05/01/2023 via telephone.      I clearly identified myself as Edward Jolly, MD. I verified that I was speaking with the correct person using two identifiers (Name: [name], and date of birth: 04-09-1961).  Consent I sought verbal advanced consent from [name] for virtual visit interactions. I informed [name] of possible security and privacy concerns, risks, and limitations associated with providing  "not-in-person" medical evaluation and management services. I also informed [name] of the availability of "in-person" appointments. Finally, I informed her that there would be a charge for the virtual visit and that she could be  personally, fully or partially, financially responsible for it. Ms. Dechene expressed understanding and agreed to proceed.   Historic Elements   [name] is a 62 y.o. year old, female patient evaluated today after our last contact on 05/01/2023. [name]  has a past medical history of Arthritis, Degenerative disorder of bone, Genuine stress incontinence, female (2012), GERD (gastroesophageal reflux disease), Migraines, and Obesity. She also  has a past surgical history that includes Laparoscopic hysterectomy (10/23/2010); Midurethral sling (05/02/2011); and excision of vulvar lesion (05/02/2011). [name] has a current medication list which includes the following prescription(s): gabapentin, hydrocodone-acetaminophen, [START ON 05/27/2023] hydrocodone-acetaminophen, melatonin, multivitamin, omeprazole, and valacyclovir. She  reports that she quit smoking about 20 years ago. Her smoking use included cigarettes. She has never used smokeless tobacco. She reports that she does not currently use alcohol. She reports that she does not use drugs. [name] has No Known Allergies.  BMI: Estimated body mass index is 30.41 kg/m as calculated from the following:   Height as of 04/03/23: 5\' 8"  (1.727 m).   Weight as of 04/03/23: 200 lb (90.7 kg). Last encounter: 03/19/2023. Last procedure: 04/03/2023.  HPI  Today, she is being contacted for a post-procedure assessment.   Post-procedure evaluation    Procedure:          Anesthesia, Analgesia, Anxiolysis:  Type: Genicular Nerves Block (Superolateral, Superomedial, and Inferomedial Genicular Nerves) #2 (#1 01/17/22)  CPT: 63016      Primary Purpose: Diagnostic Region: Lateral, Anterior, and Medial aspects of the knee joint,  above and below the knee  joint proper. Level: Superior and inferior to the knee joint. Target Area: For Genicular Nerve block(s), the targets are: the superolateral genicular nerve, located in the lateral distal portion of the femoral shaft as it curves to form the lateral epicondyle, in the region of the distal femoral metaphysis; the superomedial genicular nerve, located in the medial distal portion of the femoral shaft as it curves to form the medial epicondyle; and the inferomedial genicular nerve, located in the medial, proximal portion of the tibial shaft, as it curves to form the medial epicondyle, in the region of the proximal tibial metaphysis. Approach: Anterior, percutaneous, ipsilateral approach. Laterality: Bilateral  Anesthesia: Local (1-2% Lidocaine)  Anxiolysis: Oral 5mg  PO Valium Sedation: Minimal  Guidance: Fluoroscopy           Position: Modified Fowler's position with pillows under the targeted knee(s).   B/L Knee OA   Procedure #2 Type: Lumbar Facet, Medial Branch Block(s) #2  Primary Purpose: Diagnostic Region: Posterolateral Lumbosacral Spine Level:L3, L4, L5, Medial Branch Level(s). Injecting these levels blocks the L3-4, L4-5, lumbar facet joints. Laterality: Bilateral  Effectiveness:  Initial hour after procedure: 100 %  Subsequent 4-6 hours post-procedure: 100 %  Analgesia past initial 6 hours: back pain: 75%; knee pain: 0% Ongoing improvement:  Analgesic:  75% for 3 week with gradual return of pain Function: [name] reports improvement in function ROM: [name] reports improvement in ROM   Pharmacotherapy Assessment   Opioid Analgesic: hydrocodone 7.5 mg every 8 hours as needed, quantity 90/month    Monitoring: [place] PMP: PDMP reviewed during this encounter.       Pharmacotherapy: No side-effects or adverse reactions reported. Compliance: No problems identified. Effectiveness: Clinically acceptable. Plan: Refer to "POC". UDS:  Summary  Date Value  Ref Range Status  07/03/2022 Note  Final    Comment:    ==================================================================== [employer] Select 13 (MW) ==================================================================== Test                             Result       Flag       Units  Drug Present and Declared for Prescription Verification   Hydrocodone                    607          EXPECTED   ng/mg creat   Hydromorphone                  99           EXPECTED   ng/mg creat   Dihydrocodeine                 140          EXPECTED   ng/mg creat   Norhydrocodone                 729          EXPECTED   ng/mg creat    Sources of hydrocodone include scheduled prescription medications.    Hydromorphone, dihydrocodeine and norhydrocodone are expected    metabolites of hydrocodone. Hydromorphone and dihydrocodeine are    also available as scheduled prescription medications.  ==================================================================== Test                      Result    Flag   Units      Ref Range   Creatinine  97               mg/dL      >=16 ==================================================================== Declared Medications:  The flagging and interpretation on this report are based on the  following declared medications.  Unexpected results may arise from  inaccuracies in the declared medications.   **Note: The testing scope of this panel includes these medications:   Hydrocodone (Norco)   **Note: The testing scope of this panel does not include the  following reported medications:   Acetaminophen (Norco)  Gabapentin (Neurontin)  Melatonin  Multivitamin  Omeprazole (Prilosec)  Valacyclovir (Valtrex) ==================================================================== For clinical consultation, please call [phone]. ====================================================================    No results found for: "CBDTHCR", "D8THCCBX", "D9THCCBX"    Laboratory Chemistry Profile   Renal Lab Results  Component Value Date   BUN 10 02/24/2021   CREATININE 0.83 02/24/2021   BCR 12 06/16/2018   GFR 74.95 02/09/2021   GFRAA 92 06/16/2018   GFRNONAA >60 02/24/2021    Hepatic Lab Results  Component Value Date   AST 18 02/01/2021   ALT 18 02/01/2021   ALBUMIN 4.1 02/01/2021   ALKPHOS 81 02/01/2021    Electrolytes Lab Results  Component Value Date   NA 138 02/24/2021   K 3.9 02/24/2021   CL 107 02/24/2021   CALCIUM 9.8 02/24/2021   MG 1.8 01/30/2019    Bone Lab Results  Component Value Date   VD25OH 41.01 10/28/2020    Inflammation (CRP: Acute Phase) (ESR: Chronic Phase) No results found for: "CRP", "ESRSEDRATE", "LATICACIDVEN"       Note: Above Lab results reviewed.  Assessment  The primary encounter diagnosis was Lumbar spondylosis. Diagnoses of Lumbar facet arthropathy, Lumbar facetogenic pain, Chronic pain syndrome, and Bilateral primary osteoarthritis of knee were also pertinent to this visit.  Plan of Care  Positive response to diagnostic lumbar facet medial branch nerve blocks.  With her most recent one, she experienced 75% pain relief for approximately 3 weeks.  We discussed lumbar radiofrequency ablation for the purpose of obtaining longer-term pain relief.  For persistent knee pain related to knee osteoarthritis that was refractory to genicular nerve block, patient would like to try intra-articular knee steroid injection.  Orders:  Orders Placed This Encounter  Procedures   Radiofrequency,Lumbar    Standing Status:   Future    Standing Expiration Date:   08/01/2023    Scheduling Instructions:     Side(s): Bilateral     Level(s):L3, L4, L5, Medial Branch Nerve(s)     Sedation: With Sedation     Scheduling Timeframe: As soon as pre-approved    Order Specific Question:   Where will this procedure be performed?    Answer:   [facility]   KNEE INJECTION    Local Anesthetic & Steroid injection.     Standing Status:   Future    Standing Expiration Date:   08/01/2023    Scheduling Instructions:     Side: Bilateral     Sedation: None     Timeframe: As soon as schedule allows    Order Specific Question:   Where will this procedure be performed?    Answer:   [facility]   Follow-up plan:   Return in about 3 weeks (around 05/22/2023) for B/L knee steroid , in clinic NS.      s/p IA hyalgan #1 on 08/19/2019, #2 09/23/2019 not very effective unfortunately.  Lumbar MRI shows facet disease at L3, L4, L5 bilaterally.  Bilateral L3, L4,  L5 medial branch nerve block #1; right glenohumeral shoulder joint injection (anterior approach) 10/19/2020, 03/13/2021             Recent Visits Date Type Provider Dept  04/03/23 Procedure visit Edward Jolly, MD [facility]  03/19/23 Office Visit Edward Jolly, MD [facility]  Showing recent visits within past 90 days and meeting all other requirements Today's Visits Date Type Provider Dept  05/01/23 Office Visit Edward Jolly, MD [facility]  Showing today's visits and meeting all other requirements Future Appointments Date Type Provider Dept  06/25/23 Appointment Edward Jolly, MD [facility]  Showing future appointments within next 90 days and meeting all other requirements  I discussed the assessment and treatment plan with the patient. The patient was provided an opportunity to ask questions and all were answered. The patient agreed with the plan and demonstrated an understanding of the instructions.  Patient advised to call back or seek an in-person evaluation if the symptoms or condition worsens.  Duration of encounter: .  Note by: Edward Jolly, MD Date: 05/01/2023; Time: 4:47 PM

## 2023-05-02 ENCOUNTER — Telehealth: Payer: Self-pay

## 2023-05-02 DIAGNOSIS — M47816 Spondylosis without myelopathy or radiculopathy, lumbar region: Secondary | ICD-10-CM

## 2023-05-02 DIAGNOSIS — M5416 Radiculopathy, lumbar region: Secondary | ICD-10-CM

## 2023-05-02 NOTE — Telephone Encounter (Signed)
Per [employer] she has to have 2 medial branch nerve blocks within 6 months of requesting an RFA. She has only had one. The first one was 3 years ago. Do you want to order another facet block now?

## 2023-05-29 ENCOUNTER — Ambulatory Visit
Admission: RE | Admit: 2023-05-29 | Discharge: 2023-05-29 | Disposition: A | Discharge status: Home / Self Care | Payer: BC Managed Care – PPO | Source: Ambulatory Visit | Attending: Student in an Organized Health Care Education/Training Program | Admitting: Student in an Organized Health Care Education/Training Program

## 2023-05-29 ENCOUNTER — Encounter: Payer: Self-pay | Admitting: Student in an Organized Health Care Education/Training Program

## 2023-05-29 ENCOUNTER — Ambulatory Visit
Payer: BC Managed Care – PPO | Attending: Student in an Organized Health Care Education/Training Program | Admitting: Student in an Organized Health Care Education/Training Program

## 2023-05-29 VITALS — BP 128/82 | HR 77 | Temp 98.2°F | Resp 16 | Ht 68.0 in | Wt 200.0 lb

## 2023-05-29 DIAGNOSIS — M5416 Radiculopathy, lumbar region: Secondary | ICD-10-CM | POA: Diagnosis present

## 2023-05-29 DIAGNOSIS — M47816 Spondylosis without myelopathy or radiculopathy, lumbar region: Secondary | ICD-10-CM | POA: Diagnosis present

## 2023-05-29 DIAGNOSIS — M170 Bilateral primary osteoarthritis of knee: Secondary | ICD-10-CM | POA: Diagnosis present

## 2023-05-29 MED ORDER — DEXAMETHASONE SODIUM PHOSPHATE 10 MG/ML IJ SOLN
10.0000 mg | Freq: Once | INTRAMUSCULAR | Status: AC
  Administered 2023-05-29: 10 mg
  Filled 2023-05-29: qty 1

## 2023-05-29 MED ORDER — ROPIVACAINE HCL 2 MG/ML IJ SOLN
9.0000 mL | Freq: Once | INTRAMUSCULAR | Status: AC
  Administered 2023-05-29: 9 mL via PERINEURAL
  Filled 2023-05-29: qty 20

## 2023-05-29 MED ORDER — METHYLPREDNISOLONE ACETATE 80 MG/ML IJ SUSP
80.0000 mg | Freq: Once | INTRAMUSCULAR | Status: AC
  Administered 2023-05-29: 80 mg via INTRA_ARTICULAR
  Filled 2023-05-29: qty 1

## 2023-05-29 MED ORDER — LACTATED RINGERS IV SOLN: Freq: Once | INTRAVENOUS | Status: AC

## 2023-05-29 MED ORDER — ROPIVACAINE HCL 2 MG/ML IJ SOLN
9.0000 mL | Freq: Once | INTRAMUSCULAR | Status: AC
  Administered 2023-05-29: 20 mL via INTRA_ARTICULAR
  Filled 2023-05-29: qty 20

## 2023-05-29 MED ORDER — MIDAZOLAM HCL 2 MG/2ML IJ SOLN
0.5000 mg | Freq: Once | INTRAMUSCULAR | Status: AC
  Administered 2023-05-29: 2 mg via INTRAVENOUS
  Filled 2023-05-29: qty 2

## 2023-05-29 MED ORDER — LIDOCAINE HCL 2 % IJ SOLN
20.0000 mL | Freq: Once | INTRAMUSCULAR | Status: AC
  Administered 2023-05-29: 400 mg
  Filled 2023-05-29: qty 20

## 2023-05-29 NOTE — Progress Notes (Signed)
Safety precautions to be maintained throughout the outpatient stay will include: orient to surroundings, keep bed in low position, maintain call bell within reach at all times, provide assistance with transfer out of bed and ambulation.  

## 2023-05-29 NOTE — Progress Notes (Signed)
PROVIDER NOTE: Interpretation of information contained herein should be left to medically-trained personnel. Specific patient instructions are provided elsewhere under "Patient Instructions" section of medical record. This document was created in part using STT-dictation technology, any transcriptional errors that may result from this process are unintentional.  Patient: [name] Type: Established DOB: January 13, 1961 MRN: [id number] PCP: Theadore Nan, NP  Service: Procedure DOS: 05/29/2023 Setting: Ambulatory Location: Ambulatory outpatient facility Delivery: Face-to-face Provider: Edward Jolly, MD Specialty: Interventional Pain Management Specialty designation: 09 Location: Outpatient facility Ref. Prov.: Edward Jolly, MD       Interventional Therapy   Procedure: Lumbar Facet, Medial Branch Block(s) #1  Laterality: Bilateral  Level:  Medial Branch Level(s). Injecting these levels blocks the  lumbar facet joints. Imaging: Fluoroscopic guidance         Anesthesia: Local anesthesia (1-2% Lidocaine) Anxiolysis: 2 mg IV Versed DOS: 05/29/2023 Performed by: Edward Jolly, MD  Primary Purpose: Diagnostic/Therapeutic Indications: Low back pain severe enough to impact quality of life or function. 1. Bilateral primary osteoarthritis of knee   2. Lumbar spondylosis   3. Lumbar facet arthropathy   4. Lumbar facetogenic pain    NAS-11 Pain score:   Pre-procedure: 8 /10   Post-procedure: 0-No pain/10     Position / Prep / Materials:  Position: Prone  Prep solution: DuraPrep (Iodine Povacrylex [0.7% available iodine] and Isopropyl Alcohol, 74% w/w) Area Prepped: Posterolateral Lumbosacral Spine (Wide prep: From the lower border of the scapula down to the end of the tailbone and from flank to flank.)  Materials:  Tray: Block Needle(s):  Type: Spinal  Gauge (G): 22  Length: 3.5-in Qty: 2      Pre-op H&P Assessment:  Ms. Bisson is a 62 y.o. (year old), female patient, seen  today for interventional treatment. She  has a past surgical history that includes Laparoscopic hysterectomy (10/23/2010); Midurethral sling (05/02/2011); and excision of vulvar lesion (05/02/2011). Ms. Cosley has a current medication list which includes the following prescription(s): gabapentin, hydrocodone-acetaminophen, melatonin, multivitamin, omeprazole, and valacyclovir, and the following Facility-Administered Medications: lactated ringers. Her primarily concern today is the Back Pain (lower)  Initial Vital Signs:  Pulse/HCG Rate: 87  Temp: 97.9 F (36.6 C) Resp: 13 BP: (!) 159/106 SpO2: 98 %  BMI: Estimated body mass index is 30.41 kg/m as calculated from the following:   Height as of this encounter: 5\' 8"  (1.727 m).   Weight as of this encounter: 200 lb (90.7 kg).  Risk Assessment: Allergies: Reviewed. She has No Known Allergies.  Allergy Precautions: None required Coagulopathies: Reviewed. None identified.  Blood-thinner therapy: None at this time Active Infection(s): Reviewed. None identified. Ms. Berringer is afebrile  Site Confirmation: Ms. Papuga was asked to confirm the procedure and laterality before marking the site Procedure checklist: Completed Consent: Before the procedure and under the influence of no sedative(s), amnesic(s), or anxiolytics, the patient was informed of the treatment options, risks and possible complications. To fulfill our ethical and legal obligations, as recommended by the American Medical Association's Code of Ethics, I have informed the patient of my clinical impression; the nature and purpose of the treatment or procedure; the risks, benefits, and possible complications of the intervention; the alternatives, including doing nothing; the risk(s) and benefit(s) of the alternative treatment(s) or procedure(s); and the risk(s) and benefit(s) of doing nothing. The patient was provided information about the general risks and possible complications associated  with the procedure. These may include, but are not limited to: failure to achieve desired goals,  infection, bleeding, organ or nerve damage, allergic reactions, paralysis, and death. In addition, the patient was informed of those risks and complications associated to Spine-related procedures, such as failure to decrease pain; infection (i.e.: Meningitis, epidural or intraspinal abscess); bleeding (i.e.: epidural hematoma, subarachnoid hemorrhage, or any other type of intraspinal or peri-dural bleeding); organ or nerve damage (i.e.: Any type of peripheral nerve, nerve root, or spinal cord injury) with subsequent damage to sensory, motor, and/or autonomic systems, resulting in permanent pain, numbness, and/or weakness of one or several areas of the body; allergic reactions; (i.e.: anaphylactic reaction); and/or death. Furthermore, the patient was informed of those risks and complications associated with the medications. These include, but are not limited to: allergic reactions (i.e.: anaphylactic or anaphylactoid reaction(s)); adrenal axis suppression; blood sugar elevation that in diabetics may result in ketoacidosis or comma; water retention that in patients with history of congestive heart failure may result in shortness of breath, pulmonary edema, and decompensation with resultant heart failure; weight gain; swelling or edema; medication-induced neural toxicity; particulate matter embolism and blood vessel occlusion with resultant organ, and/or nervous system infarction; and/or aseptic necrosis of one or more joints. Finally, the patient was informed that Medicine is not an exact science; therefore, there is also the possibility of unforeseen or unpredictable risks and/or possible complications that may result in a catastrophic outcome. The patient indicated having understood very clearly. We have given the patient no guarantees and we have made no promises. Enough time was given to the patient to ask  questions, all of which were answered to the patient's satisfaction. Ms. Mckinlay has indicated that she wanted to continue with the procedure. Attestation: I, the ordering provider, attest that I have discussed with the patient the benefits, risks, side-effects, alternatives, likelihood of achieving goals, and potential problems during recovery for the procedure that I have provided informed consent. Date  Time: 05/29/2023  8:19 AM   Pre-Procedure Preparation:  Monitoring: As per clinic protocol. Respiration, ETCO2, SpO2, BP, heart rate and rhythm monitor placed and checked for adequate function Safety Precautions: Patient was assessed for positional comfort and pressure points before starting the procedure. Time-out: I initiated and conducted the "Time-out" before starting the procedure, as per protocol. The patient was asked to participate by confirming the accuracy of the "Time Out" information. Verification of the correct person, site, and procedure were performed and confirmed by me, the nursing staff, and the patient. "Time-out" conducted as per Joint Commission's Universal Protocol (UP.01.01.01). Time: 0933 Start Time: 0937 hrs.  Description of Procedure:          Laterality: (see above) Targeted Levels: (see above)  Safety Precautions: Aspiration looking for blood return was conducted prior to all injections. At no point did we inject any substances, as a needle was being advanced. Before injecting, the patient was told to immediately notify me if she was experiencing any new onset of "ringing in the ears, or metallic taste in the mouth". No attempts were made at seeking any paresthesias. Safe injection practices and needle disposal techniques used. Medications properly checked for expiration dates. SDV (single dose vial) medications used. After the completion of the procedure, all disposable equipment used was discarded in the proper designated medical waste containers. Local Anesthesia:  Protocol guidelines were followed. The patient was positioned over the fluoroscopy table. The area was prepped in the usual manner. The time-out was completed. The target area was identified using fluoroscopy. A 12-in long, straight, sterile hemostat was used with fluoroscopic guidance to  locate the targets for each level blocked. Once located, the skin was marked with an approved surgical skin marker. Once all sites were marked, the skin (epidermis, dermis, and hypodermis), as well as deeper tissues (fat, connective tissue and muscle) were infiltrated with a small amount of a short-acting local anesthetic, loaded on a 10cc syringe with a 25G, 1.5-in  Needle. An appropriate amount of time was allowed for local anesthetics to take effect before proceeding to the next step. Local Anesthetic: Lidocaine 2.0% The unused portion of the local anesthetic was discarded in the proper designated containers. Technical description of process:  L3 Medial Branch Nerve Block (MBB): The target area for the L3 medial branch is at the junction of the postero-lateral aspect of the superior articular process and the superior, posterior, and medial edge of the transverse process of L4. Under fluoroscopic guidance, a Quincke needle was inserted until contact was made with os over the superior postero-lateral aspect of the pedicular shadow (target area). After negative aspiration for blood, 2mL of the nerve block solution was injected without difficulty or complication. The needle was removed intact. L4 Medial Branch Nerve Block (MBB): The target area for the L4 medial branch is at the junction of the postero-lateral aspect of the superior articular process and the superior, posterior, and medial edge of the transverse process of L5. Under fluoroscopic guidance, a Quincke needle was inserted until contact was made with os over the superior postero-lateral aspect of the pedicular shadow (target area). After negative aspiration for  blood, 2mL of the nerve block solution was injected without difficulty or complication. The needle was removed intact. L5 Medial Branch Nerve Block (MBB): The target area for the L5 medial branch is at the junction of the postero-lateral aspect of the superior articular process and the superior, posterior, and medial edge of the sacral ala. Under fluoroscopic guidance, a Quincke needle was inserted until contact was made with os over the superior postero-lateral aspect of the pedicular shadow (target area). After negative aspiration for blood, 2mL of the nerve block solution was injected without difficulty or complication. The needle was removed intact.   Once the entire procedure was completed, the treated area was cleaned, making sure to leave some of the prepping solution back to take advantage of its long term bactericidal properties.         Illustration of the posterior view of the lumbar spine and the posterior neural structures. Laminae of L2 through S1 are labeled. DPRL5, dorsal primary ramus of L5; DPRS1, dorsal primary ramus of S1; DPR3, dorsal primary ramus of L3; FJ, facet (zygapophyseal) joint L3-L4; I, inferior articular process of L4; LB1, lateral branch of dorsal primary ramus of L1; IAB, inferior articular branches from L3 medial branch (supplies L4-L5 facet joint); IBP, intermediate branch plexus; MB3, medial branch of dorsal primary ramus of L3; NR3, third lumbar nerve root; S, superior articular process of L5; SAB, superior articular branches from L4 (supplies L4-5 facet joint also); TP3, transverse process of L3.   Facet Joint Innervation (* possible contribution)  L1-2 T12, L1 (L2*)  Medial Branch  L2-3 L1, L2 (L3*)         "          "  L3-4 L2, L3 (L4*)         "          "  L4-5 L3, L4 (L5*)         "          "  L5-S1 L4, L5, S1          "          "    Vitals:   05/29/23 0932 05/29/23 0937 05/29/23 0942 05/29/23 0951  BP: (!) 149/93 (!) 135/90 122/88 128/82  Pulse:   64 62 77  Resp: 17 16 16 16   Temp:    98.2 F (36.8 C)  TempSrc:      SpO2: 99% 99% 99% 99%  Weight:      Height:         End Time: 0941 hrs.  Imaging Guidance (Spinal):          Type of Imaging Technique: Fluoroscopy Guidance (Spinal) Indication(s): Assistance in needle guidance and placement for procedures requiring needle placement in or near specific anatomical locations not easily accessible without such assistance. Exposure Time: Please see nurses notes. Contrast: None used. Fluoroscopic Guidance: I was personally present during the use of fluoroscopy. "Tunnel Vision Technique" used to obtain the best possible view of the target area. Parallax error corrected before commencing the procedure. "Direction-depth-direction" technique used to introduce the needle under continuous pulsed fluoroscopy. Once target was reached, antero-posterior, oblique, and lateral fluoroscopic projection used confirm needle placement in all planes. Images permanently stored in EMR. Interpretation: No contrast injected. I personally interpreted the imaging intraoperatively. Adequate needle placement confirmed in multiple planes. Permanent images saved into the patient's record.  Post-operative Assessment:  Post-procedure Vital Signs:  Pulse/HCG Rate: 77  Temp: 98.2 F (36.8 C) Resp: 16 BP: 128/82 SpO2: 99 %  EBL: None  Complications: No immediate post-treatment complications observed by team, or reported by patient.  Note: The patient tolerated the entire procedure well. A repeat set of vitals were taken after the procedure and the patient was kept under observation following institutional policy, for this type of procedure. Post-procedural neurological assessment was performed, showing return to baseline, prior to discharge. The patient was provided with post-procedure discharge instructions, including a section on how to identify potential problems. Should any problems arise concerning this  procedure, the patient was given instructions to immediately contact us, at any time, without hesitation. In any case, we plan to contact the patient by telephone for a follow-up status report regarding this interventional procedure.  Comments:  No additional relevant information.  Plan of Care (POC)  Orders:  Orders Placed This Encounter  Procedures   DG PAIN CLIN[facility] C-ARM 1-60 MIN NO REPORT    Intraoperative interpretation by procedural physician at [facility].    Standing Status:   Standing    Number of Occurrences:   1    Order Specific Question:   Reason for exam:    Answer:   Assistance in needle guidance and placement for procedures requiring needle placement in or near specific anatomical locations not easily accessible without such assistance.   Chronic Opioid Analgesic:  hydrocodone 7.5 mg every 8 hours as needed, quantity 90/month    Medications ordered for procedure: Meds ordered this encounter  Medications   lidocaine (XYLOCAINE) 2 % (with pres) injection 400 mg   lactated ringers infusion   midazolam (VERSED) injection 0.5-2 mg    Make sure Flumazenil is available in the pyxis when using this medication. If oversedation occurs, administer 0.2 mg IV over 15 sec. If after 45 sec no response, administer 0.2 mg again over 1 min; may repeat at 1 min intervals; not to exceed 4 doses (1 mg)   dexamethasone (DECADRON) injection 10 mg   dexamethasone (  DECADRON) injection 10 mg   ropivacaine (PF) 2 mg/mL (0.2%) (NAROPIN) injection 9 mL   methylPREDNISolone acetate (DEPO-MEDROL) injection 80 mg   ropivacaine (PF) 2 mg/mL (0.2%) (NAROPIN) injection 9 mL   Medications administered: We administered lidocaine, lactated ringers, midazolam, dexamethasone, dexamethasone, ropivacaine (PF) 2 mg/mL (0.2%), methylPREDNISolone acetate, and ropivacaine (PF) 2 mg/mL (0.2%).  See the medical record for exact dosing, route, and time of administration.  Follow-up plan:   Return for  Keep sch. appt.       Recent Visits Date Type Provider Dept  05/01/23 Office Visit Edward Jolly, MD [facility]  04/03/23 Procedure visit Edward Jolly, MD [facility]  03/19/23 Office Visit Edward Jolly, MD [facility]  Showing recent visits within past 90 days and meeting all other requirements Today's Visits Date Type Provider Dept  05/29/23 Procedure visit Edward Jolly, MD [facility]  Showing today's visits and meeting all other requirements Future Appointments Date Type Provider Dept  06/25/23 Appointment Edward Jolly, MD [facility]  Showing future appointments within next 90 days and meeting all other requirements  Disposition: Discharge home  Discharge (Date  Time): 05/29/2023; 0955 hrs.   Primary Care Physician: Theadore Nan, NP Location: [facility] Note by: Edward Jolly, MD (TTS technology used. I apologize for any typographical errors that were not detected and corrected.) Date: 05/29/2023; Time: 10:14 AM  Disclaimer:  Medicine is not an Visual merchandiser. The only guarantee in medicine is that nothing is guaranteed. It is important to note that the decision to proceed with this intervention was based on the information collected from the patient. The Data and conclusions were drawn from the patient's questionnaire, the interview, and the physical examination. Because the information was provided in large part by the patient, it cannot be guaranteed that it has not been purposely or unconsciously manipulated. Every effort has been made to obtain as much relevant data as possible for this evaluation. It is important to note that the conclusions that lead to this procedure are derived in large part from the available data. Always take into account that the treatment will also be dependent on availability of resources and existing treatment guidelines, considered by other Pain Management  Practitioners as being common knowledge and practice, at the time of the intervention. For Medico-Legal purposes, it is also important to point out that variation in procedural techniques and pharmacological choices are the acceptable norm. The indications, contraindications, technique, and results of the above procedure should only be interpreted and judged by a Board-Certified Interventional Pain Specialist with extensive familiarity and expertise in the same exact procedure and technique.

## 2023-05-29 NOTE — Progress Notes (Signed)
PROVIDER NOTE: Interpretation of information contained herein should be left to medically-trained personnel. Specific patient instructions are provided elsewhere under "Patient Instructions" section of medical record. This document was created in part using STT-dictation technology, any transcriptional errors that may result from this process are unintentional.  Patient: [name] Type: Established DOB: March 05, 1961 MRN: [id number] PCP: Vanessa Nan, NP  Service: Procedure DOS: 05/29/2023 Setting: Ambulatory Location: Ambulatory outpatient facility Delivery: Face-to-face Provider: Edward Jolly, MD Specialty: Interventional Pain Management Specialty designation: 09 Location: Outpatient facility Ref. Prov.: Vanessa Jolly, MD       Interventional Therapy   Type:  Steroid Intra-articular Knee Injection          Laterality: Bilateral (-50) Level/approach: Medial Imaging guidance: None required ([id number]) Anesthesia: Local anesthesia (1-2% Lidocaine) Anxiolysis:IV Versed DOS: 05/29/2023  Performed by: Vanessa Jolly, MD  Purpose: Diagnostic/Therapeutic Indications: Knee arthralgia associated to osteoarthritis of the knee 1. Bilateral primary osteoarthritis of knee   2. Lumbar spondylosis   3. Lumbar facet arthropathy   4. Lumbar facetogenic pain    NAS-11 score:   Pre-procedure: 8 /10   Post-procedure: 0-No pain/10     Pre-Procedure Preparation  Monitoring: As per clinic protocol.  Risk Assessment: Vitals:  [id number] body mass index is 30.41 kg/m as calculated from the following:   Height as of this encounter: 5\' 8"  (1.727 m).   Weight as of this encounter: 200 lb (90.7 kg)., Rate:87 , BP:(!) 159/106, Resp:13, Temp:97.9 F (36.6 C), SpO2:98 %  Allergies: She has No Known Allergies.  Precautions: No additional precautions required  Blood-thinner(s): None at this time  Coagulopathies: Reviewed. None identified.   Active Infection(s): Reviewed. None identified. Ms.  Huynh is afebrile   Location setting: Exam room Position: Sitting w/ knee bent 90 degrees Safety Precautions: Patient was assessed for positional comfort and pressure points before starting the procedure. Prepping solution: DuraPrep (Iodine Povacrylex [0.7% available iodine] and Isopropyl Alcohol, 74% w/w) Prep Area: Entire knee region Approach: percutaneous, just above the tibial plateau, lateral to the infrapatellar tendon. Intended target: Intra-articular knee space Materials: Tray: Block Needle(s): Regular Qty: 1/side Length: 1.5-inch Gauge: 25G (x1) + 22G (x1)  Meds ordered this encounter  Medications   lidocaine (XYLOCAINE) 2 % (with pres) injection 400 mg   lactated ringers infusion   midazolam (VERSED) injection 0.5-2 mg    Make sure Flumazenil is available in the pyxis when using this medication. If oversedation occurs, administer 0.2 mg IV over 15 sec. If after 45 sec no response, administer 0.2 mg again over 1 min; may repeat at 1 min intervals; not to exceed 4 doses (1 mg)   dexamethasone (DECADRON) injection 10 mg   dexamethasone (DECADRON) injection 10 mg   ropivacaine (PF) 2 mg/mL (0.2%) (NAROPIN) injection 9 mL   methylPREDNISolone acetate (DEPO-MEDROL) injection 80 mg   ropivacaine (PF) 2 mg/mL (0.2%) (NAROPIN) injection 9 mL    Orders Placed This Encounter  Procedures   DG PAIN CLINIC C-ARM 1-60 MIN NO REPORT    Intraoperative interpretation by procedural physician at [facility].    Standing Status:   Standing    Number of Occurrences:   1    Order Specific Question:   Reason for exam:    Answer:   Assistance in needle guidance and placement for procedures requiring needle placement in or near specific anatomical locations not easily accessible without such assistance.     10 cc solution made of 9 cc of 0.2% ropivacaine, 1 cc of  methylprednisolone, 80 mg/cc.  5 cc injected into each knee.  Time-out: 4098 I initiated and conducted the "Time-out"  before starting the procedure, as per protocol. The patient was asked to participate by confirming the accuracy of the "Time Out" information. Verification of the correct person, site, and procedure were performed and confirmed by me, the nursing staff, and the patient. "Time-out" conducted as per Joint Commission's Universal Protocol (UP.01.01.01). Procedure checklist: Completed  H&P (Pre-op  Assessment)  [name] is a 62 y.o. (year old), female patient, seen today for interventional treatment. She  has a past surgical history that includes Laparoscopic hysterectomy (10/23/2010); Midurethral sling (05/02/2011); and excision of vulvar lesion (05/02/2011). [name] has a current medication list which includes the following prescription(s): gabapentin, hydrocodone-acetaminophen, melatonin, multivitamin, omeprazole, and valacyclovir, and the following Facility-Administered Medications: lactated ringers. Her primarily concern today is the Back Pain (lower)  She has No Known Allergies.   Last encounter: My last encounter with her was on 05/01/2023. Pertinent problems: [name] has Obesity; Arthritis of wrist; Chronic pain syndrome; Lumbar facet arthropathy; Bilateral primary osteoarthritis of knee; Bilateral hip pain; Chronic SI joint pain; Lumbar radiculopathy; Long-term current use of opiate analgesic; Right rotator cuff tear arthropathy; Chronic right shoulder pain; Fibromyalgia; and Cervical radicular pain (left) on their pertinent problem list. Pain Assessment: Severity of Chronic pain is reported as a 8 /10. Location: Back Lower/radiates to hip. Onset: More than a month ago. Quality: Aching, Constant. Timing: Constant. Modifying factor(s): meds, heat. Vitals:  height is 5\' 8"  (1.727 m) and weight is 200 lb (90.7 kg). Her temperature is 98.2 F (36.8 C). Her blood pressure is 128/82 and her pulse is 77. Her respiration is 16 and oxygen saturation is 99%.   Reason for encounter: "interventional pain  management therapy due pain of at least four (4) weeks in duration, with failure to respond and/or inability to tolerate more conservative care.  Site Confirmation: [name] was asked to confirm the procedure and laterality before marking the site.  Consent: Before the procedure and under the influence of no sedative(s), amnesic(s), or anxiolytics, the patient was informed of the treatment options, risks and possible complications. To fulfill our ethical and legal obligations, as recommended by the American Medical Association's Code of Ethics, I have informed the patient of my clinical impression; the nature and purpose of the treatment or procedure; the risks, benefits, and possible complications of the intervention; the alternatives, including doing nothing; the risk(s) and benefit(s) of the alternative treatment(s) or procedure(s); and the risk(s) and benefit(s) of doing nothing. The patient was provided information about the general risks and possible complications associated with the procedure. These may include, but are not limited to: failure to achieve desired goals, infection, bleeding, organ or nerve damage, allergic reactions, paralysis, and death. In addition, the patient was informed of those risks and complications associated to Spine-related procedures, such as failure to decrease pain; infection (i.e.: Meningitis, epidural or intraspinal abscess); bleeding (i.e.: epidural hematoma, subarachnoid hemorrhage, or any other type of intraspinal or peri-dural bleeding); organ or nerve damage (i.e.: Any type of peripheral nerve, nerve root, or spinal cord injury) with subsequent damage to sensory, motor, and/or autonomic systems, resulting in permanent pain, numbness, and/or weakness of one or several areas of the body; allergic reactions; (i.e.: anaphylactic reaction); and/or death. Furthermore, the patient was informed of those risks and complications associated with the medications. These  include, but are not limited to: allergic reactions (i.e.: anaphylactic or anaphylactoid reaction(s)); adrenal axis  suppression; blood sugar elevation that in diabetics may result in ketoacidosis or comma; water retention that in patients with history of congestive heart failure may result in shortness of breath, pulmonary edema, and decompensation with resultant heart failure; weight gain; swelling or edema; medication-induced neural toxicity; particulate matter embolism and blood vessel occlusion with resultant organ, and/or nervous system infarction; and/or aseptic necrosis of one or more joints. Finally, the patient was informed that Medicine is not an exact science; therefore, there is also the possibility of unforeseen or unpredictable risks and/or possible complications that may result in a catastrophic outcome. The patient indicated having understood very clearly. We have given the patient no guarantees and we have made no promises. Enough time was given to the patient to ask questions, all of which were answered to the patient's satisfaction. Ms. Sime has indicated that she wanted to continue with the procedure. Attestation: I, the ordering provider, attest that I have discussed with the patient the benefits, risks, side-effects, alternatives, likelihood of achieving goals, and potential problems during recovery for the procedure that I have provided informed consent.  Date  Time: 05/29/2023  8:19 AM  Description of procedure  Start Time: 0937 hrs  Local Anesthesia: Once the patient was positioned, prepped, and time-out was completed. The target area was identified located. The skin was marked with an approved surgical skin marker. Once marked, the skin (epidermis, dermis, and hypodermis), and deeper tissues (fat, connective tissue and muscle) were infiltrated with a small amount of a short-acting local anesthetic, loaded on a 10cc syringe with a 25G, 1.5-in  Needle. An appropriate amount of time  was allowed for local anesthetics to take effect before proceeding to the next step. Local Anesthetic: Lidocaine 1-2% The unused portion of the local anesthetic was discarded in the proper designated containers. Safety Precautions: Aspiration looking for blood return was conducted prior to all injections. At no point did I inject any substances, as a needle was being advanced. Before injecting, the patient was told to immediately notify me if she was experiencing any new onset of "ringing in the ears, or metallic taste in the mouth". No attempts were made at seeking any paresthesias. Safe injection practices and needle disposal techniques used. Medications properly checked for expiration dates. SDV (single dose vial) medications used. After the completion of the procedure, all disposable equipment used was discarded in the proper designated medical waste containers.  Technical description: Protocol guidelines were followed. After positioning, the target area was identified and prepped in the usual manner. Skin & deeper tissues infiltrated with local anesthetic. Appropriate amount of time allowed to pass for local anesthetics to take effect. Proper needle placement secured. Once satisfactory needle placement was confirmed, I proceeded to inject the desired solution in slow, incremental fashion, intermittently assessing for discomfort or any signs of abnormal or undesired spread of substance. Once completed, the needle was removed and disposed of, as per hospital protocols. The area was cleaned, making sure to leave some of the prepping solution back to take advantage of its long term bactericidal properties.  Aspiration:  Negative        Vitals:   05/29/23 0932 05/29/23 0937 05/29/23 0942 05/29/23 0951  BP: (!) 149/93 (!) 135/90 122/88 128/82  Pulse:  64 62 77  Resp: 17 16 16 16   Temp:    98.2 F (36.8 C)  TempSrc:      SpO2: 99% 99% 99% 99%  Weight:      Height:  End Time: 0941  hrs  Imaging guidance  Imaging-assisted Technique: None required. Indication(s): N/A Exposure Time: N/A Contrast: None Fluoroscopic Guidance: N/A Ultrasound Guidance: N/A Interpretation: N/A  Post-op assessment  Post-procedure Vital Signs:  Pulse/HCG Rate: 77  Temp: 98.2 F (36.8 C) Resp: 16 BP: 128/82 SpO2: 99 %  EBL: None  Complications: No immediate post-treatment complications observed by team, or reported by patient.  Note: The patient tolerated the entire procedure well. A repeat set of vitals were taken after the procedure and the patient was kept under observation following institutional policy, for this type of procedure. Post-procedural neurological assessment was performed, showing return to baseline, prior to discharge. The patient was provided with post-procedure discharge instructions, including a section on how to identify potential problems. Should any problems arise concerning this procedure, the patient was given instructions to immediately contact us, at any time, without hesitation. In any case, we plan to contact the patient by telephone for a follow-up status report regarding this interventional procedure.  Comments:  No additional relevant information.  Plan of care  Chronic Opioid Analgesic:  hydrocodone 7.5 mg every 8 hours as needed, quantity 90/month    Medications administered: We administered lidocaine, lactated ringers, midazolam, dexamethasone, dexamethasone, ropivacaine (PF) 2 mg/mL (0.2%), methylPREDNISolone acetate, and ropivacaine (PF) 2 mg/mL (0.2%).  Follow-up plan:   No follow-ups on file.     Recent Visits Date Type Provider Dept  05/01/23 Office Visit Vanessa Jolly, MD [facility]  04/03/23 Procedure visit Vanessa Jolly, MD [facility]  03/19/23 Office Visit Vanessa Jolly, MD [facility]  Showing recent visits within past 90 days and meeting all other requirements Today's Visits Date Type Provider Dept   05/29/23 Procedure visit Vanessa Jolly, MD [facility]  Showing today's visits and meeting all other requirements Future Appointments Date Type Provider Dept  06/25/23 Appointment Vanessa Jolly, MD [facility]  Showing future appointments within next 90 days and meeting all other requirements   Disposition: Discharge home  Discharge (Date  Time): 05/29/2023; 0955 hrs.   Primary Care Physician: Vanessa Nan, NP Location: [facility] Note by: Vanessa Jolly, MD Date: 05/29/2023; Time: 10:12 AM  DISCLAIMER: Medicine is not an exact science. It has no guarantees or warranties. The decision to proceed with this intervention was based on the information collected from the patient. Conclusions were drawn from the patient's questionnaire, interview, and examination. Because information was provided in large part by the patient, it cannot be guaranteed that it has not been purposely or unconsciously manipulated or altered. Every effort has been made to obtain as much accurate, relevant, available data as possible. Always take into account that the treatment will also be dependent on availability of resources and existing treatment guidelines, considered by other Pain Management Specialists as being common knowledge and practice, at the time of the intervention. It is also important to point out that variation in procedural techniques and pharmacological choices are the acceptable norm. For Medico-Legal review purposes, the indications, contraindications, technique, and results of the these procedures should only be evaluated, judged and interpreted by a Board-Certified Interventional Pain Specialist with extensive familiarity and expertise in the same exact procedure and technique.

## 2023-05-29 NOTE — Patient Instructions (Signed)

## 2023-05-30 ENCOUNTER — Telehealth: Payer: Self-pay | Admitting: *Deleted

## 2023-05-30 NOTE — Telephone Encounter (Signed)
Denies complications post procedure. Has some facial flushing but denies fever or other problematic symptoms. Instructed to call if needed or starts to run a fever.

## 2023-06-25 ENCOUNTER — Encounter: Payer: BC Managed Care – PPO | Admitting: Student in an Organized Health Care Education/Training Program

## 2023-06-27 ENCOUNTER — Other Ambulatory Visit: Payer: Self-pay | Admitting: Student in an Organized Health Care Education/Training Program

## 2023-06-27 DIAGNOSIS — G894 Chronic pain syndrome: Secondary | ICD-10-CM

## 2023-07-04 ENCOUNTER — Ambulatory Visit
Payer: BC Managed Care – PPO | Attending: Student in an Organized Health Care Education/Training Program | Admitting: Student in an Organized Health Care Education/Training Program

## 2023-07-04 ENCOUNTER — Encounter: Payer: Self-pay | Admitting: Student in an Organized Health Care Education/Training Program

## 2023-07-04 VITALS — BP 139/93 | HR 85 | Temp 98.1°F | Resp 16 | Ht 68.0 in | Wt 195.0 lb

## 2023-07-04 DIAGNOSIS — M5416 Radiculopathy, lumbar region: Secondary | ICD-10-CM | POA: Diagnosis not present

## 2023-07-04 DIAGNOSIS — M47816 Spondylosis without myelopathy or radiculopathy, lumbar region: Secondary | ICD-10-CM | POA: Insufficient documentation

## 2023-07-04 DIAGNOSIS — G894 Chronic pain syndrome: Secondary | ICD-10-CM | POA: Diagnosis not present

## 2023-07-04 MED ORDER — HYDROCODONE-ACETAMINOPHEN 7.5-325 MG PO TABS
1.0000 | ORAL_TABLET | Freq: Three times a day (TID) | ORAL | 0 refills | Status: DC | PRN
Start: 2023-09-02 — End: 2023-08-21

## 2023-07-04 MED ORDER — HYDROCODONE-ACETAMINOPHEN 7.5-325 MG PO TABS
1.0000 | ORAL_TABLET | Freq: Three times a day (TID) | ORAL | 0 refills | Status: AC | PRN
Start: 2023-07-04 — End: 2023-08-03

## 2023-07-04 MED ORDER — HYDROCODONE-ACETAMINOPHEN 7.5-325 MG PO TABS
1.0000 | ORAL_TABLET | Freq: Three times a day (TID) | ORAL | 0 refills | Status: AC | PRN
Start: 2023-08-03 — End: 2023-09-02

## 2023-07-04 NOTE — Progress Notes (Signed)
Nursing Pain Medication Assessment:  Safety precautions to be maintained throughout the outpatient stay will include: orient to surroundings, keep bed in low position, maintain call bell within reach at all times, provide assistance with transfer out of bed and ambulation.  Medication Inspection Compliance:  empty bottle   Medication: Hydrocodone/APAP Pill/Patch Count:  0 of 90 pills remain Pill/Patch Appearance:  no pills to count Bottle Appearance: Standard pharmacy container. Clearly labeled. Filled Date: 06 / 11 / 2024 Last Medication intake:  Ran out of medicine more than 48 hours ago

## 2023-07-04 NOTE — Patient Instructions (Signed)
____________________________________________________________________________________________  General Risks and Possible Complications  Patient Responsibilities: It is important that you read this as it is part of your informed consent. It is our duty to inform you of the risks and possible complications associated with treatments offered to you. It is your responsibility as a patient to read this and to ask questions about anything that is not clear or that you believe was not covered in this document.  Patient's Rights: You have the right to refuse treatment. You also have the right to change your mind, even after initially having agreed to have the treatment done. However, under this last option, if you wait until the last second to change your mind, you may be charged for the materials used up to that point.  Introduction: Medicine is not an Visual merchandiser. Everything in Medicine, including the lack of treatment(s), carries the potential for danger, harm, or loss (which is by definition: Risk). In Medicine, a complication is a secondary problem, condition, or disease that can aggravate an already existing one. All treatments carry the risk of possible complications. The fact that a side effects or complications occurs, does not imply that the treatment was conducted incorrectly. It must be clearly understood that these can happen even when everything is done following the highest safety standards.  No treatment: You can choose not to proceed with the proposed treatment alternative. The "PRO(s)" would include: avoiding the risk of complications associated with the therapy. The "CON(s)" would include: not getting any of the treatment benefits. These benefits fall under one of three categories: diagnostic; therapeutic; and/or palliative. Diagnostic benefits include: getting information which can ultimately lead to improvement of the disease or symptom(s). Therapeutic benefits are those associated with  the successful treatment of the disease. Finally, palliative benefits are those related to the decrease of the primary symptoms, without necessarily curing the condition (example: decreasing the pain from a flare-up of a chronic condition, such as incurable terminal cancer).  General Risks and Complications: These are associated to most interventional treatments. They can occur alone, or in combination. They fall under one of the following six (6) categories: no benefit or worsening of symptoms; bleeding; infection; nerve damage; allergic reactions; and/or death. No benefits or worsening of symptoms: In Medicine there are no guarantees, only probabilities. No healthcare provider can ever guarantee that a medical treatment will work, they can only state the probability that it may. Furthermore, there is always the possibility that the condition may worsen, either directly, or indirectly, as a consequence of the treatment. Bleeding: This is more common if the patient is taking a blood thinner, either prescription or over the counter (example: Goody Powders, Fish oil, Aspirin, Garlic, etc.), or if suffering a condition associated with impaired coagulation (example: Hemophilia, cirrhosis of the liver, low platelet counts, etc.). However, even if you do not have one on these, it can still happen. If you have any of these conditions, or take one of these drugs, make sure to notify your treating physician. Infection: This is more common in patients with a compromised immune system, either due to disease (example: diabetes, cancer, human immunodeficiency virus [HIV], etc.), or due to medications or treatments (example: therapies used to treat cancer and rheumatological diseases). However, even if you do not have one on these, it can still happen. If you have any of these conditions, or take one of these drugs, make sure to notify your treating physician. Nerve Damage: This is more common when the treatment is an  invasive one, but it can also happen with the use of medications, such as those used in the treatment of cancer. The damage can occur to small secondary nerves, or to large primary ones, such as those in the spinal cord and brain. This damage may be temporary or permanent and it may lead to impairments that can range from temporary numbness to permanent paralysis and/or brain death. Allergic Reactions: Any time a substance or material comes in contact with our body, there is the possibility of an allergic reaction. These can range from a mild skin rash (contact dermatitis) to a severe systemic reaction (anaphylactic reaction), which can result in death. Death: In general, any medical intervention can result in death, most of the time due to an unforeseen complication. ____________________________________________________________________________________________    ____________________________________________________________________________________________  Post-Procedure Discharge Instructions  Instructions: Apply ice:  Purpose: This will minimize any swelling and discomfort after procedure.  When: Day of procedure, as soon as you get home. How: Fill a plastic sandwich bag with crushed ice. Cover it with a small towel and apply to injection site. How long: (15 min on, 15 min off) Apply for 15 minutes then remove x 15 minutes.  Repeat sequence on day of procedure, until you go to bed. Apply heat:  Purpose: To treat any soreness and discomfort from the procedure. When: Starting the next day after the procedure. How: Apply heat to procedure site starting the day following the procedure. How long: May continue to repeat daily, until discomfort goes away. Food intake: Start with clear liquids (like water) and advance to regular food, as tolerated.  Physical activities: Keep activities to a minimum for the first 8 hours after the procedure. After that, then as tolerated. Driving: If you have received  any sedation, be responsible and do not drive. You are not allowed to drive for 24 hours after having sedation. Blood thinner: (Applies only to those taking blood thinners) You may restart your blood thinner 6 hours after your procedure. Insulin: (Applies only to Diabetic patients taking insulin) As soon as you can eat, you may resume your normal dosing schedule. Infection prevention: Keep procedure site clean and dry. Shower daily and clean area with soap and water. Post-procedure Pain Diary: Extremely important that this be done correctly and accurately. Recorded information will be used to determine the next step in treatment. For the purpose of accuracy, follow these rules: Evaluate only the area treated. Do not report or include pain from an untreated area. For the purpose of this evaluation, ignore all other areas of pain, except for the treated area. After your procedure, avoid taking a long nap and attempting to complete the pain diary after you wake up. Instead, set your alarm clock to go off every hour, on the hour, for the initial 8 hours after the procedure. Document the duration of the numbing medicine, and the relief you are getting from it. Do not go to sleep and attempt to complete it later. It will not be accurate. If you received sedation, it is likely that you were given a medication that may cause amnesia. Because of this, completing the diary at a later time may cause the information to be inaccurate. This information is needed to plan your care. Follow-up appointment: Keep your post-procedure follow-up evaluation appointment after the procedure (usually 2 weeks for most procedures, 6 weeks for radiofrequencies). DO NOT FORGET to bring you pain diary with you.   Expect: (What should I expect to see with my procedure?) From  numbing medicine (AKA: Local Anesthetics): Numbness or decrease in pain. You may also experience some weakness, which if present, could last for the duration of the  local anesthetic. Onset: Full effect within 15 minutes of injected. Duration: It will depend on the type of local anesthetic used. On the average, 1 to 8 hours.  From steroids (Applies only if steroids were used): Decrease in swelling or inflammation. Once inflammation is improved, relief of the pain will follow. Onset of benefits: Depends on the amount of swelling present. The more swelling, the longer it will take for the benefits to be seen. In some cases, up to 10 days. Duration: Steroids will stay in the system x 2 weeks. Duration of benefits will depend on multiple posibilities including persistent irritating factors. Side-effects: If present, they may typically last 2 weeks (the duration of the steroids). Frequent: Cramps (if they occur, drink Gatorade and take over-the-counter Magnesium 450-500 mg once to twice a day); water retention with temporary weight gain; increases in blood sugar; decreased immune system response; increased appetite. Occasional: Facial flushing (red, warm cheeks); mood swings; menstrual changes. Uncommon: Long-term decrease or suppression of natural hormones; bone thinning. (These are more common with higher doses or more frequent use. This is why we prefer that our patients avoid having any injection therapies in other practices.)  Very Rare: Severe mood changes; psychosis; aseptic necrosis. From procedure: Some discomfort is to be expected once the numbing medicine wears off. This should be minimal if ice and heat are applied as instructed.  Call if: (When should I call?) You experience numbness and weakness that gets worse with time, as opposed to wearing off. New onset bowel or bladder incontinence. (Applies only to procedures done in the spine)  Emergency Numbers: Durning business hours (Monday - Thursday, 8:00 AM - 4:00 PM) (Friday, 9:00 AM - 12:00 Noon): [phone] After hours: [phone] NOTE: If you are having a problem and are unable connect with,  or to talk to a provider, then go to your nearest urgent care or emergency department. If the problem is serious and urgent, please call 911. ____________________________________________________________________________________________    ______________________________________________________________________  Preparing for your procedure  Appointments: If you think you may not be able to keep your appointment, call 24-48 hours in advance to cancel. We need time to make it available to others.  During your procedure appointment there will be: No Prescription Refills. No disability issues to discussed. No medication changes or discussions.  Instructions: Food intake: Avoid eating anything solid for at least 8 hours prior to your procedure. Clear liquid intake: You may take clear liquids such as water up to 2 hours prior to your procedure. (No carbonated drinks. No soda.) Transportation: Unless otherwise stated by your physician, bring a driver. Morning Medicines: Except for blood thinners, take all of your other morning medications with a sip of water. Make sure to take your heart and blood pressure medicines. If your blood pressure's lower number is above 100, the case will be rescheduled. Blood thinners: Make sure to stop your blood thinners as instructed.  If you take a blood thinner, but were not instructed to stop it, call our office [phone] and ask to talk to a nurse. Not stopping a blood thinner prior to certain procedures could lead to serious complications. Diabetics on insulin: Notify the staff so that you can be scheduled 1st case in the morning. If your diabetes requires high dose insulin, take only  of your normal insulin dose  the morning of the procedure and notify the staff that you have done so. Preventing infections: Shower with an antibacterial soap the morning of your procedure.  Build-up your immune system: Take 1000 mg of Vitamin C with every meal (3 times a day) the  day prior to your procedure. Antibiotics: Inform the nursing staff if you are taking any antibiotics or if you have any conditions that may require antibiotics prior to procedures. (Example: recent joint implants)   Pregnancy: If you are pregnant make sure to notify the nursing staff. Not doing so may result in injury to the fetus, including death.  Sickness: If you have a cold, fever, or any active infections, call and cancel or reschedule your procedure. Receiving steroids while having an infection may result in complications. Arrival: You must be in the facility at least 30 minutes prior to your scheduled procedure. Tardiness: Your scheduled time is also the cutoff time. If you do not arrive at least 15 minutes prior to your procedure, you will be rescheduled.  Children: Do not bring any children with you. Make arrangements to keep them home. Dress appropriately: There is always a possibility that your clothing may get soiled. Avoid long dresses. Valuables: Do not bring any jewelry or valuables.  Reasons to call and reschedule or cancel your procedure: (Following these recommendations will minimize the risk of a serious complication.) Surgeries: Avoid having procedures within 2 weeks of any surgery. (Avoid for 2 weeks before or after any surgery). Flu Shots: Avoid having procedures within 2 weeks of a flu shots or . (Avoid for 2 weeks before or after immunizations). Barium: Avoid having a procedure within 7-10 days after having had a radiological study involving the use of radiological contrast. (Myelograms, Barium swallow or enema study). Heart attacks: Avoid any elective procedures or surgeries for the initial 6 months after a "Myocardial Infarction" (Heart Attack). Blood thinners: It is imperative that you stop these medications before procedures. Let us know if you if you take any blood thinner.  Infection: Avoid procedures during or within two weeks of an infection (including chest colds or  gastrointestinal problems). Symptoms associated with infections include: Localized redness, fever, chills, night sweats or profuse sweating, burning sensation when voiding, cough, congestion, stuffiness, runny nose, sore throat, diarrhea, nausea, vomiting, cold or Flu symptoms, recent or current infections. It is specially important if the infection is over the area that we intend to treat. Heart and lung problems: Symptoms that may suggest an active cardiopulmonary problem include: cough, chest pain, breathing difficulties or shortness of breath, dizziness, ankle swelling, uncontrolled high or unusually low blood pressure, and/or palpitations. If you are experiencing any of these symptoms, cancel your procedure and contact your primary care physician for an evaluation.  Remember:  Regular Business hours are:  Monday to Thursday 8:00 AM to 4:00 PM  Provider's Schedule: Delano Metz, MD:  Procedure days: Tuesday and Thursday 7:30 AM to 4:00 PM  Edward Jolly, MD:  Procedure days: Monday and Wednesday 7:30 AM to 4:00 PM  ______________________________________________________________________   Radiofrequency Ablation Radiofrequency ablation is a procedure that is performed to relieve pain. The procedure is often used for back, neck, or arm pain. Radiofrequency ablation involves the use of a machine that creates radio waves to make heat. During the procedure, the heat is applied to the nerve that carries the pain signal. The heat damages the nerve and interferes with the pain signal. Pain relief usually starts about 2 weeks after the procedure and lasts for  6 months to 1 year. Tell a health care provider about: Any allergies you have. All medicines you are taking, including vitamins, herbs, eye drops, creams, and over-the-counter medicines. Any problems you or family members have had with anesthetic medicines. Any bleeding problems you have. Any surgeries you have had. Any medical conditions  you have. Whether you are pregnant or may be pregnant. What are the risks? Generally, this is a safe procedure. However, problems may occur, including: Pain or soreness at the injection site. Allergic reaction to medicines given during the procedure. Bleeding. Infection at the injection site. Damage to nerves or blood vessels. What happens before the procedure? When to stop eating and drinking Follow instructions from your health care provider about what you may eat and drink before your procedure. These may include: 8 hours before the procedure Stop eating most foods. Do not eat meat, fried foods, or fatty foods. Eat only light foods, such as toast or crackers. All liquids are okay except energy drinks and alcohol. 6 hours before the procedure Stop eating. Drink only clear liquids, such as water, clear fruit juice, black coffee, plain tea, and sports drinks. Do not drink energy drinks or alcohol. 2 hours before the procedure Stop drinking all liquids. You may be allowed to take medicine with small sips of water. If you do not follow your health care provider's instructions, your procedure may be delayed or canceled. Medicines Ask your health care provider about: Changing or stopping your regular medicines. This is especially important if you are taking diabetes medicines or blood thinners. Taking medicines such as aspirin and ibuprofen. These medicines can thin your blood. Do not take these medicines unless your health care provider tells you to take them. Taking over-the-counter medicines, vitamins, herbs, and supplements. General instructions Ask your health care provider what steps will be taken to help prevent infection. These steps may include: Removing hair at the procedure site. Washing skin with a germ-killing soap. Taking antibiotic medicine. If you will be going home right after the procedure, plan to have a responsible adult: Take you home from the hospital or clinic.  You will not be allowed to drive. Care for you for the time you are told. What happens during the procedure?  You will be awake during the procedure. You will need to be able to talk with the health care provider during the procedure. An IV will be inserted into one of your veins. You will be given one or more of the following: A medicine to help you relax (sedative). A medicine to numb the area (local anesthetic). Your health care provider will insert a radiofrequency needle into the area to be treated. This is done with the help of fluoroscopy. A wire that carries the radio waves (electrode) will be put through the radiofrequency needle. An electrical pulse will be sent through the electrode to verify the correct nerve that is causing your pain. You will feel a tingling sensation, and you may have muscle twitching. The tissue around the needle tip will be heated by an electric current that comes from the radiofrequency machine. This will numb the nerves. The needle will be removed. A bandage (dressing) will be put on the insertion area. The procedure may vary among health care providers and hospitals. What happens after the procedure? Your blood pressure, heart rate, breathing rate, and blood oxygen level will be monitored until you leave the hospital or clinic. Return to your normal activities as told by your health care provider. Ask  your health care provider what activities are safe for you. If you were given a sedative during the procedure, it can affect you for several hours. Do not drive or operate machinery until your health care provider says that it is safe. Summary Radiofrequency ablation is a procedure that is performed to relieve pain. The procedure is often used for back, neck, or arm pain. Radiofrequency ablation involves the use of a machine that creates radio waves to make heat. Plan to have a responsible adult take you home from the hospital or clinic. Do not drive or  operate machinery until your health care provider says that it is safe. Return to your normal activities as told by your health care provider. Ask your health care provider what activities are safe for you. This information is not intended to replace advice given to you by your health care provider. Make sure you discuss any questions you have with your health care provider. Document Revised: 05/23/2021 Document Reviewed: 05/23/2021 [employer]vier Patient Education  2024 [employer].

## 2023-07-04 NOTE — Progress Notes (Signed)
PROVIDER NOTE: Information contained herein reflects review and annotations entered in association with encounter. Interpretation of such information and data should be left to medically-trained personnel. Information provided to patient can be located elsewhere in the medical record under "Patient Instructions". Document created using STT-dictation technology, any transcriptional errors that may result from process are unintentional.    Patient: [name]  Service Category: E/M  Provider: Edward Jolly, MD  DOB: [birth year]  DOS: 07/04/2023  Referring Provider: Theadore Nan, NP  MRN: [id number]  Specialty: Interventional Pain Management  PCP: Vanessa Nan, NP  Type: Established Patient  Setting: Ambulatory outpatient    Location: Office  Delivery: Face-to-face     HPI  [name], a 62 y.o. year old female, is here today because of her Lumbar spondylosis [M47.816]. [name] primary complain today is Other (Generalized joint pain.  Knees bilateral ), Back Pain (Lumbar left is worse ), Neck Pain (Bilateral ), and Shoulder Pain (Right )  Pertinent problems: [name] has Obesity; Arthritis of wrist; Chronic pain syndrome; Lumbar spondylosis; Bilateral primary osteoarthritis of knee; Bilateral hip pain; Chronic SI joint pain; Lumbar radiculopathy; Long-term current use of opiate analgesic; Right rotator cuff tear arthropathy; Chronic right shoulder pain; Fibromyalgia; and Cervical radicular pain (left) on their pertinent problem list. Pain Assessment: Severity of Chronic pain is reported as a 8 /10. Location: Knee (see visit info for additional pain sites.) Left, Right/denies. Onset: More than a month ago. Quality: Discomfort, Constant, Aching. Timing: Constant. Modifying factor(s): medications, heat,. Vitals:  height is 5\' 8"  (1.727 m) and weight is 195 lb (88.5 kg). Her temporal temperature is 98.1 F (36.7 C). Her blood pressure is 139/93 (abnormal) and her pulse is 85. Her  respiration is 16 and oxygen saturation is 99%.  BMI: Estimated body mass index is 29.65 kg/m as calculated from the following:   Height as of this encounter: 5\' 8"  (1.727 m).   Weight as of this encounter: 195 lb (88.5 kg). Last encounter: 05/01/2023. Last procedure: 05/29/2023.  Reason for encounter: medication management and to discuss lumbar RFA.  Patient is status post 2 diagnostic lumbar facet medial branch nerve blocks.  Her first 1 was 04/03/2023 that provided 75% pain relief for 3 weeks and her second 1 was on 05/29/2023 that provided 75% pain relief for 2 and half weeks.  She states that during this period she was performing activities of daily living more comfortably and she was in less pain.  She was also utilizing less hydrocodone.  Given return of axial low back pain that has been responding to her diagnostic medial branch nerve blocks, we discussed lumbar radiofrequency ablation for the purpose of obtaining longer-term pain relief.  Risk and benefits were reviewed and patient would like to proceed.  Refill of hydrocodone as below, no change in dose.   Pharmacotherapy Assessment  Analgesic: hydrocodone 7.5 mg every 8 hours as needed, quantity 90/month    Monitoring: [place] PMP: PDMP reviewed during this encounter.       Pharmacotherapy: No side-effects or adverse reactions reported. Compliance: No problems identified. Effectiveness: Clinically acceptable.  Vanessa Ammons, RN  07/04/2023  8:45 AM  Sign when Signing Visit Nursing Pain Medication Assessment:  Safety precautions to be maintained throughout the outpatient stay will include: orient to surroundings, keep bed in low position, maintain call bell within reach at all times, provide assistance with transfer out of bed and ambulation.  Medication Inspection Compliance:  empty bottle  Medication: Hydrocodone/APAP Pill/Patch Count:  0 of 90 pills remain Pill/Patch Appearance:  no pills to count Bottle Appearance:  Standard pharmacy container. Clearly labeled. Filled Date: 06 / 11 / 2024 Last Medication intake:  Ran out of medicine more than 48 hours ago  No results found for: "CBDTHCR" No results found for: "D8THCCBX" No results found for: "D9THCCBX"  UDS:  Summary  Date Value Ref Range Status  07/03/2022 Note  Final    Comment:    ==================================================================== [facility] (MW) ==================================================================== Test                             Result       Flag       Units  Drug Present and Declared for Prescription Verification   Hydrocodone                    607          EXPECTED   ng/mg creat   Hydromorphone                  99           EXPECTED   ng/mg creat   Dihydrocodeine                 140          EXPECTED   ng/mg creat   Norhydrocodone                 729          EXPECTED   ng/mg creat    Sources of hydrocodone include scheduled prescription medications.    Hydromorphone, dihydrocodeine and norhydrocodone are expected    metabolites of hydrocodone. Hydromorphone and dihydrocodeine are    also available as scheduled prescription medications.  ==================================================================== Test                      Result    Flag   Units      Ref Range   Creatinine              97               mg/dL      >=60 ==================================================================== Declared Medications:  The flagging and interpretation on this report are based on the  following declared medications.  Unexpected results may arise from  inaccuracies in the declared medications.   **Note: The testing scope of this panel includes these medications:   Hydrocodone (Norco)   **Note: The testing scope of this panel does not include the  following reported medications:   Acetaminophen (Norco)  Gabapentin (Neurontin)  Melatonin  Multivitamin  Omeprazole (Prilosec)  Valacyclovir  (Valtrex) ==================================================================== For clinical consultation, please call [phone]. ====================================================================       ROS  Constitutional: Denies any fever or chills Gastrointestinal: No reported hemesis, hematochezia, vomiting, or acute GI distress Musculoskeletal:  Axial low back pain Neurological: No reported episodes of acute onset apraxia, aphasia, dysarthria, agnosia, amnesia, paralysis, loss of coordination, or loss of consciousness  Medication Review  HYDROcodone-acetaminophen, Melatonin, gabapentin, multivitamin, omeprazole, and valACYclovir  History Review  Allergy: Ms. Levels has No Known Allergies. Drug: Ms. Shankland  reports no history of drug use. Alcohol:  reports that she does not currently use alcohol. Tobacco:  reports that she quit smoking about 20 years ago. Her smoking use included cigarettes. She has never used  smokeless tobacco. Social: Ms. Kessner  reports that she quit smoking about 20 years ago. Her smoking use included cigarettes. She has never used smokeless tobacco. She reports that she does not currently use alcohol. She reports that she does not use drugs. Medical:  has a past medical history of Arthritis, Degenerative disorder of bone, Genuine stress incontinence, female (2012), GERD (gastroesophageal reflux disease), Migraines, and Obesity. Surgical: Ms. Sarwar  has a past surgical history that includes Laparoscopic hysterectomy (10/23/2010); Midurethral sling (05/02/2011); and excision of vulvar lesion (05/02/2011). Family: family history includes Arthritis in her maternal grandmother and mother; Cancer in her father and paternal grandfather; Diabetes in her son; Heart attack in her maternal grandfather, mother, and paternal grandmother; Hypertension in her mother.  Laboratory Chemistry Profile   Renal Lab Results  Component Value Date   BUN 10 02/24/2021   CREATININE  0.83 02/24/2021   BCR 12 06/16/2018   GFR 74.95 02/09/2021   GFRAA 92 06/16/2018   GFRNONAA >60 02/24/2021    Hepatic Lab Results  Component Value Date   AST 18 02/01/2021   ALT 18 02/01/2021   ALBUMIN 4.1 02/01/2021   ALKPHOS 81 02/01/2021    Electrolytes Lab Results  Component Value Date   NA 138 02/24/2021   K 3.9 02/24/2021   CL 107 02/24/2021   CALCIUM 9.8 02/24/2021   MG 1.8 01/30/2019    Bone Lab Results  Component Value Date   VD25OH 41.01 10/28/2020    Inflammation (CRP: Acute Phase) (ESR: Chronic Phase) No results found for: "CRP", "ESRSEDRATE", "LATICACIDVEN"       Note: Above Lab results reviewed.  Physical Exam  General appearance: Well nourished, well developed, and well hydrated. In no apparent acute distress Mental status: Alert, oriented x 3 (person, place, & time)       Respiratory: No evidence of acute respiratory distress Eyes: PERLA Vitals: BP (!) 139/93 (BP Location: Left Arm, Patient Position: Sitting, Cuff Size: Large)   Pulse 85   Temp 98.1 F (36.7 C) (Temporal)   Resp 16   Ht 5\' 8"  (1.727 m)   Wt 195 lb (88.5 kg)   SpO2 99%   BMI 29.65 kg/m  BMI: Estimated body mass index is 29.65 kg/m as calculated from the following:   Height as of this encounter: 5\' 8"  (1.727 m).   Weight as of this encounter: 195 lb (88.5 kg). Ideal: Ideal body weight: 63.9 kg (140 lb 14 oz) Adjusted ideal body weight: 73.7 kg (162 lb 8.4 oz)  Lumbar Spine Area Exam  Skin & Axial Inspection: No masses, redness, or swelling Alignment: Symmetrical Functional ROM: Pain restricted ROM right greater than left       Stability: No instability detected Muscle Tone/Strength: Functionally intact. No obvious neuro-muscular anomalies detected. Sensory (Neurological): Lumbar facet mediated Positive pain with lumbar extension and facet loading   Gait & Posture Assessment  Ambulation: Unassisted Gait: Relatively normal for age and body habitus Posture: WNL  Lower  Extremity Exam      Side: Right lower extremity   Side: Left lower extremity  Stability: No instability observed           Stability: No instability observed          Skin & Extremity Inspection: Skin color, temperature, and hair growth are WNL. No peripheral edema or cyanosis. No masses, redness, swelling, asymmetry, or associated skin lesions. No contractures.   Skin & Extremity Inspection: Skin color, temperature, and hair growth are WNL. No peripheral  edema or cyanosis. No masses, redness, swelling, asymmetry, or associated skin lesions. No contractures.  Functional ROM: Pain restricted ROM for hip and knee joints Limited SLR (straight leg raise)   Functional ROM: Pain restricted ROM for hip and knee joints          Muscle Tone/Strength: Functionally intact. No obvious neuro-muscular anomalies detected.   Muscle Tone/Strength: Functionally intact. No obvious neuro-muscular anomalies detected.  Sensory (Neurological): Arthropathic pain pattern of knee       Sensory (Neurological): Arthropathic pain pattern of knee          DTR: Patellar: deferred today Achilles: deferred today Plantar: deferred today   DTR: Patellar: deferred today Achilles: deferred today Plantar: deferred today  Palpation: No palpable anomalies   Palpation: No palpable anomalies        Assessment   Diagnosis Status  1. Lumbar spondylosis   2. Lumbar facet arthropathy   3. Lumbar facetogenic pain   4. Chronic pain syndrome    Responding Responding Responding   Updated Problems: Problem  Lumbar Spondylosis    Plan of Care    [name] has a current medication list which includes the following long-term medication(s): gabapentin and omeprazole.  Pharmacotherapy (Medications Ordered): Meds ordered this encounter  Medications   HYDROcodone-acetaminophen (NORCO) 7.5-325 MG tablet    Sig: Take 1 tablet by mouth 3 (three) times daily as needed for severe pain. Must last 30 days    Dispense:  90  tablet    Refill:  0    Chronic Pain: STOP Act (Not applicable) Fill 1 day early if closed on refill date. Avoid benzodiazepines within 8 hours of opioids   HYDROcodone-acetaminophen (NORCO) 7.5-325 MG tablet    Sig: Take 1 tablet by mouth 3 (three) times daily as needed for severe pain. Must last 30 days    Dispense:  90 tablet    Refill:  0    Chronic Pain: STOP Act (Not applicable) Fill 1 day early if closed on refill date. Avoid benzodiazepines within 8 hours of opioids   HYDROcodone-acetaminophen (NORCO) 7.5-325 MG tablet    Sig: Take 1 tablet by mouth 3 (three) times daily as needed for severe pain. Must last 30 days    Dispense:  90 tablet    Refill:  0    Chronic Pain: STOP Act (Not applicable) Fill 1 day early if closed on refill date. Avoid benzodiazepines within 8 hours of opioids   Orders:  Orders Placed This Encounter  Procedures   Radiofrequency,Lumbar    Standing Status:   Future    Standing Expiration Date:   10/04/2023    Scheduling Instructions:     Side(s): Bilateral     Level(s): L3, L4, L5, Medial Branch Nerve(s)     Sedation: With Sedation     Scheduling Timeframe: As soon as pre-approved    Order Specific Question:   Where will this procedure be performed?    Answer:   [facility]   Follow-up plan:   Return in about 18 days (around 07/22/2023) for B/L L3, 4, 5 RFA , ECT (block 1 hr).      s/p IA hyalgan #1 on 08/19/2019, #2 09/23/2019 not very effective unfortunately.  Lumbar MRI shows facet disease at L3, L4, L5 bilaterally.  Bilateral L3, L4, L5 medial branch nerve block #1; right glenohumeral shoulder joint injection (anterior approach) 10/19/2020, 03/13/2021              Recent Visits Date  Type Provider Dept  05/29/23 Procedure visit Vanessa Jolly, MD [facility]  05/01/23 Office Visit Vanessa Jolly, MD [facility]  Showing recent visits within past 90 days and meeting all other requirements Today's Visits Date Type Provider  Dept  07/04/23 Office Visit Vanessa Jolly, MD [facility]  Showing today's visits and meeting all other requirements Future Appointments Date Type Provider Dept  09/26/23 Appointment Vanessa Jolly, MD [facility]  Showing future appointments within next 90 days and meeting all other requirements  I discussed the assessment and treatment plan with the patient. The patient was provided an opportunity to ask questions and all were answered. The patient agreed with the plan and demonstrated an understanding of the instructions.  Patient advised to call back or seek an in-person evaluation if the symptoms or condition worsens.  Duration of encounter: .  Total time on encounter, as per AMA guidelines included both the face-to-face and non-face-to-face time personally spent by the physician and/or other qualified health care professional(s) on the day of the encounter (includes time in activities that require the physician or other qualified health care professional and does not include time in activities normally performed by clinical staff). Physician's time may include the following activities when performed: Preparing to see the patient (e.g., pre-charting review of records, searching for previously ordered imaging, lab work, and nerve conduction tests) Review of prior analgesic pharmacotherapies. Reviewing PMP Interpreting ordered tests (e.g., lab work, imaging, nerve conduction tests) Performing post-procedure evaluations, including interpretation of diagnostic procedures Obtaining and/or reviewing separately obtained history Performing a medically appropriate examination and/or evaluation Counseling and educating the patient/family/caregiver Ordering medications, tests, or procedures Referring and communicating with other health care professionals (when not separately reported) Documenting clinical information in the electronic or other health  record Independently interpreting results (not separately reported) and communicating results to the patient/ family/caregiver Care coordination (not separately reported)  Note by: Vanessa Jolly, MD Date: 07/04/2023; Time: 9:49 AM

## 2023-07-17 ENCOUNTER — Encounter: Payer: Self-pay | Admitting: Student in an Organized Health Care Education/Training Program

## 2023-08-21 ENCOUNTER — Encounter: Payer: Self-pay | Admitting: Family Medicine

## 2023-08-21 ENCOUNTER — Ambulatory Visit (INDEPENDENT_AMBULATORY_CARE_PROVIDER_SITE_OTHER): Payer: BC Managed Care – PPO | Admitting: Family Medicine

## 2023-08-21 ENCOUNTER — Other Ambulatory Visit (HOSPITAL_COMMUNITY)
Admission: RE | Admit: 2023-08-21 | Discharge: 2023-08-21 | Disposition: A | Discharge status: Home / Self Care | Payer: BC Managed Care – PPO | Source: Ambulatory Visit | Attending: Family Medicine | Admitting: Family Medicine

## 2023-08-21 VITALS — BP 140/84 | HR 69 | Ht 68.0 in | Wt 228.0 lb

## 2023-08-21 DIAGNOSIS — Z124 Encounter for screening for malignant neoplasm of cervix: Secondary | ICD-10-CM

## 2023-08-21 DIAGNOSIS — Z01419 Encounter for gynecological examination (general) (routine) without abnormal findings: Secondary | ICD-10-CM | POA: Diagnosis not present

## 2023-08-21 DIAGNOSIS — R002 Palpitations: Secondary | ICD-10-CM

## 2023-08-21 DIAGNOSIS — Z1211 Encounter for screening for malignant neoplasm of colon: Secondary | ICD-10-CM

## 2023-08-21 DIAGNOSIS — B001 Herpesviral vesicular dermatitis: Secondary | ICD-10-CM

## 2023-08-21 DIAGNOSIS — Z1231 Encounter for screening mammogram for malignant neoplasm of breast: Secondary | ICD-10-CM

## 2023-08-21 HISTORY — DX: Herpesviral vesicular dermatitis: B00.1

## 2023-08-21 HISTORY — DX: Palpitations: R00.2

## 2023-08-21 MED ORDER — VALACYCLOVIR HCL 500 MG PO TABS
500.0000 mg | ORAL_TABLET | Freq: Two times a day (BID) | ORAL | 1 refills | Status: DC
Start: 2023-08-21 — End: 2024-10-23

## 2023-08-21 NOTE — Progress Notes (Signed)
Patient presents for Annual.  LMP: Hysterectomy Last pap:  07/25/20 Mammogram:  2019 pt wanted to see if she can have Mammogram through [facility].   STD Screening: Declines Flu Vaccine :  Will receive at work.  CC:  Heart palpitations even when resting and elevated blood pressures  pt inquired about a referral to cardiologist  Fun Fact: Works at a [employer].

## 2023-08-21 NOTE — Assessment & Plan Note (Signed)
46962 -  given FH and exertional nature of it, will refer to cards for Zio +/- coronary calcium vs. Stress test or the like.

## 2023-08-21 NOTE — Assessment & Plan Note (Signed)
[id number] - Refilled her Valtrex

## 2023-08-21 NOTE — Progress Notes (Signed)
Subjective:     [name] is a 62 y.o. female and is here for a comprehensive physical exam. The patient reports problems - chest discofort with palpitations with exertion with FH of CAD and remote smoking history.    The following portions of the patient's history were reviewed and updated as appropriate: allergies, current medications, past family history, past medical history, past social history, past surgical history, and problem list.  Review of Systems Pertinent items noted in HPI and remainder of comprehensive ROS otherwise negative.   Objective:  Chaperone present for exam   BP (!) 140/84   Pulse 69   Ht 5\' 8"  (1.727 m)   Wt 228 lb (103.4 kg)   BMI 34.67 kg/m  General appearance: alert, cooperative, and appears stated age Head: Normocephalic, without obvious abnormality, atraumatic Neck: no adenopathy, supple, symmetrical, trachea midline, and thyroid not enlarged, symmetric, no tenderness/mass/nodules Lungs: clear to auscultation bilaterally Breasts: normal appearance, no masses or tenderness Heart: regular rate and rhythm, S1, S2 normal, no murmur, click, rub or gallop Abdomen: soft, non-tender; bowel sounds normal; no masses,  no organomegaly Pelvic: cervix normal in appearance, external genitalia normal, no adnexal masses or tenderness, uterus surgically absent, and vagina normal without discharge Extremities: extremities normal, atraumatic, no cyanosis or edema Pulses: 2+ and symmetric Skin: Skin color, texture, turgor normal. No rashes or lesions Lymph nodes: Cervical, supraclavicular, and axillary nodes normal. Neurologic: Grossly normal    Assessment:    Healthy female exam.      Plan:   Problem List Items Addressed This Visit       Unprioritized   Fever blister    99214 - Refilled her Valtrex      Relevant Medications   valACYclovir (VALTREX) 500 MG tablet   Palpitations    99214 -  given FH and exertional nature of it, will refer to cards  for Zio +/- coronary calcium vs. Stress test or the like.      Relevant Orders   Ambulatory referral to Cardiology   Other Visit Diagnoses     Encounter for gynecological examination without abnormal finding    -  Primary   Relevant Orders   CBC   Comprehensive metabolic panel   Hemoglobin A1c   TSH   Lipid panel   VITAMIN D 25 Hydroxy (Vit-D Deficiency, Fractures)   Screening for malignant neoplasm of cervix       Relevant Orders   Cytology - PAP   Encounter for screening mammogram for malignant neoplasm of breast       Relevant Orders   MM 3D SCREENING MAMMOGRAM BILATERAL BREAST   Screen for colon cancer       Agrees to Cologard   Relevant Orders   Cologuard      Return in 1 year (on 08/20/2024).    See After Visit Summary for Counseling Recommendations

## 2023-08-22 ENCOUNTER — Encounter: Payer: Self-pay | Admitting: Student in an Organized Health Care Education/Training Program

## 2023-08-22 ENCOUNTER — Encounter: Payer: Self-pay | Admitting: Family Medicine

## 2023-08-22 LAB — CBC
Hematocrit: 43.7 % (ref 34.0–46.6)
Hemoglobin: 14.1 g/dL (ref 11.1–15.9)
MCH: 26.7 pg (ref 26.6–33.0)
MCHC: 32.3 g/dL (ref 31.5–35.7)
MCV: 83 fL (ref 79–97)
Platelets: 254 10*3/uL (ref 150–450)
RBC: 5.29 x10E6/uL — ABNORMAL HIGH (ref 3.77–5.28)
RDW: 13.4 % (ref 11.7–15.4)
WBC: 5.7 10*3/uL (ref 3.4–10.8)

## 2023-08-22 LAB — COMPREHENSIVE METABOLIC PANEL
ALT: 36 IU/L — ABNORMAL HIGH (ref 0–32)
AST: 25 IU/L (ref 0–40)
Albumin: 4.3 g/dL (ref 3.8–4.9)
Alkaline Phosphatase: 100 IU/L (ref 44–121)
BUN/Creatinine Ratio: 12 (ref 12–28)
BUN: 10 mg/dL (ref 8–27)
Bilirubin Total: 0.3 mg/dL (ref 0.0–1.2)
CO2: 21 mmol/L (ref 20–29)
Calcium: 9.7 mg/dL (ref 8.7–10.3)
Chloride: 104 mmol/L (ref 96–106)
Creatinine, Ser: 0.83 mg/dL (ref 0.57–1.00)
Globulin, Total: 2.5 g/dL (ref 1.5–4.5)
Glucose: 100 mg/dL — ABNORMAL HIGH (ref 70–99)
Potassium: 4.1 mmol/L (ref 3.5–5.2)
Sodium: 140 mmol/L (ref 134–144)
Total Protein: 6.8 g/dL (ref 6.0–8.5)
eGFR: 81 mL/min/{1.73_m2} (ref >59)

## 2023-08-22 LAB — LIPID PANEL
Chol/HDL Ratio: 4.7 ratio — ABNORMAL HIGH (ref 0.0–4.4)
Cholesterol, Total: 206 mg/dL — ABNORMAL HIGH (ref 100–199)
HDL: 44 mg/dL (ref >39)
LDL Chol Calc (NIH): 124 mg/dL — ABNORMAL HIGH (ref 0–99)
Triglycerides: 218 mg/dL — ABNORMAL HIGH (ref 0–149)
VLDL Cholesterol Cal: 38 mg/dL (ref 5–40)

## 2023-08-22 LAB — HEMOGLOBIN A1C
Est. average glucose Bld gHb Est-mCnc: 114 mg/dL
Hgb A1c MFr Bld: 5.6 % (ref 4.8–5.6)

## 2023-08-22 LAB — TSH: TSH: 1.21 u[IU]/mL (ref 0.450–4.500)

## 2023-08-22 LAB — VITAMIN D 25 HYDROXY (VIT D DEFICIENCY, FRACTURES): Vit D, 25-Hydroxy: 54 ng/mL (ref 30.0–100.0)

## 2023-08-23 LAB — CYTOLOGY - PAP
Adequacy: ABSENT
Comment: NEGATIVE
Diagnosis: NEGATIVE
High risk HPV: NEGATIVE

## 2023-09-04 ENCOUNTER — Ambulatory Visit: Payer: BC Managed Care – PPO | Attending: Cardiology

## 2023-09-04 ENCOUNTER — Ambulatory Visit
Admission: RE | Admit: 2023-09-04 | Discharge: 2023-09-04 | Disposition: A | Discharge status: Home / Self Care | Payer: BC Managed Care – PPO | Source: Ambulatory Visit | Attending: Family Medicine | Admitting: Family Medicine

## 2023-09-04 ENCOUNTER — Encounter: Payer: Self-pay | Admitting: Cardiology

## 2023-09-04 ENCOUNTER — Telehealth: Payer: Self-pay

## 2023-09-04 ENCOUNTER — Ambulatory Visit: Payer: BC Managed Care – PPO | Attending: Cardiology | Admitting: Cardiology

## 2023-09-04 ENCOUNTER — Telehealth (HOSPITAL_BASED_OUTPATIENT_CLINIC_OR_DEPARTMENT_OTHER): Payer: Self-pay

## 2023-09-04 VITALS — BP 128/68 | HR 65 | Ht 67.5 in | Wt 227.8 lb

## 2023-09-04 DIAGNOSIS — G4733 Obstructive sleep apnea (adult) (pediatric): Secondary | ICD-10-CM | POA: Diagnosis not present

## 2023-09-04 DIAGNOSIS — Z1231 Encounter for screening mammogram for malignant neoplasm of breast: Secondary | ICD-10-CM

## 2023-09-04 DIAGNOSIS — G4730 Sleep apnea, unspecified: Secondary | ICD-10-CM | POA: Insufficient documentation

## 2023-09-04 DIAGNOSIS — R0609 Other forms of dyspnea: Secondary | ICD-10-CM

## 2023-09-04 DIAGNOSIS — E785 Hyperlipidemia, unspecified: Secondary | ICD-10-CM | POA: Insufficient documentation

## 2023-09-04 DIAGNOSIS — R002 Palpitations: Secondary | ICD-10-CM | POA: Diagnosis not present

## 2023-09-04 DIAGNOSIS — I10 Essential (primary) hypertension: Secondary | ICD-10-CM

## 2023-09-04 DIAGNOSIS — Z8249 Family history of ischemic heart disease and other diseases of the circulatory system: Secondary | ICD-10-CM

## 2023-09-04 HISTORY — DX: Sleep apnea, unspecified: G47.30

## 2023-09-04 HISTORY — DX: Hyperlipidemia, unspecified: E78.5

## 2023-09-04 NOTE — Progress Notes (Signed)
Cardiology Consultation:    Date:  09/04/2023   ID:  [name], DOB 01/16/1961, MRN [id number]  PCP:  Theadore Nan, NP  Cardiologist:  Gypsy Balsam, MD   Referring MD: Theadore Nan, NP   Chief Complaint  Patient presents with   Heart Eval    Patient has a strong fam h/o heart disease    Leg Swelling   Shortness of Breath   Tachycardia    History of Present Illness:    [name] is a 62 y.o. female who is being seen today for the evaluation of multiple symptoms at the request of Theadore Nan, NP.  She is an ex-smoker with past medical history significant for GERD, dyslipidemia, degenerative bone disorder she was referred to [place] because of constellation of symptoms for last few months she complained of having palpitations typically abrupt onset gradual offset she will feel her heart speeding up that make her feel dizzy but never to the point of passing out.  There is many times associated with shortness of breath.  She said she can take few deep breath and sensation goes away.  Duration usually is few minutes.  She also noted for last few months been getting more short of breath climbing stairs will do that she also noted swelling of lower extremities especially evening time.  She snores apparently a lot sometimes stop breathing at night and she sometimes awakened in the middle of the night choking.  I suspect she may have sleep apnea.  She quit smoking many years ago she used nicotine patch to help her since quitting smoking now she does not use any nicotine products.  She does not exercise on the regular basis.  But she does have a dog [place] that she walk with every single day.  She noticed that she is getting LB more short of breath than previously.  Does have family history of coronary disease but not premature  Past Medical History:  Diagnosis Date   Arthritis    Degenerative disorder of bone    Genuine stress incontinence, female 2012    patient had surgical repair   GERD (gastroesophageal reflux disease)    Migraines    Obesity     Past Surgical History:  Procedure Laterality Date   excision of vulvar lesion  05/02/2011   LAPAROSCOPIC HYSTERECTOMY  10/23/2010   mennorhagia/fibroids   Midurethral sling  05/02/2011    Current Medications: Current Meds  Medication Sig   gabapentin (NEURONTIN) 600 MG tablet Take 1 tablet (600 mg total) by mouth at bedtime. 300 mg qday, 600 mg qhs   HYDROcodone-acetaminophen (NORCO) 7.5-325 MG tablet Take 1 tablet by mouth every 12 (twelve) hours as needed for moderate pain.   Melatonin 5 MG CAPS Take 1 capsule by mouth daily.   Multiple Vitamin (MULTIVITAMIN) tablet Take 1 tablet by mouth daily.   omeprazole (PRILOSEC) 20 MG capsule TAKE 1 CAPSULE BY MOUTH EVERY DAY (Patient taking differently: Take 20 mg by mouth daily.)   valACYclovir (VALTREX) 500 MG tablet Take 1 tablet (500 mg total) by mouth 2 (two) times daily.     Allergies:   Patient has no known allergies.   Social History   Socioeconomic History   Marital status: Legally Separated    Spouse name: Not on file   Number of children: Not on file   Years of education: Not on file   Highest education level: Not on file  Occupational History  Not on file  Tobacco Use   Smoking status: Former    Current packs/day: 0.00    Types: Cigarettes    Quit date: 01/28/2003    Years since quitting: 20.6   Smokeless tobacco: Never  Vaping Use   Vaping status: Never Used  Substance and Sexual Activity   Alcohol use: Not Currently    Comment: socially   Drug use: No   Sexual activity: Yes    Partners: Male    Birth control/protection: Surgical  Other Topics Concern   Not on file  Social History Narrative   Not on file   Social Determinants of Health   Financial Resource Strain: Not on file  Food Insecurity: Not on file  Transportation Needs: Not on file  Physical Activity: Not on file  Stress: Not on file  Social  Connections: Not on file     Family History: The patient's family history includes Arthritis in her maternal grandmother and mother; Cancer in her father and paternal grandfather; Diabetes in her son; Heart attack in her maternal grandfather, mother, and paternal grandmother; Hypertension in her mother. There is no history of Breast cancer. ROS:   Please see the history of present illness.    All 14 point review of systems negative except as described per history of present illness.  EKGs/Labs/Other Studies Reviewed:    The following studies were reviewed today:   EKG:  EKG Interpretation Date/Time:  Wednesday September 04 2023 15:49:32 EDT Ventricular Rate:  65 PR Interval:  122 QRS Duration:  74 QT Interval:  388 QTC Calculation: 403 R Axis:   10  Text Interpretation: Normal sinus rhythm Low voltage QRS Borderline ECG When compared with ECG of 24-Feb-2021 10:16, No significant change was found Confirmed by Gypsy Balsam [phone]) on 09/04/2023 3:52:45 PM    Recent Labs: 08/21/2023: ALT 36; BUN 10; Creatinine, Ser 0.83; Hemoglobin 14.1; Platelets 254; Potassium 4.1; Sodium 140; TSH 1.210  Recent Lipid Panel    Component Value Date/Time   CHOL 206 (H) 08/21/2023 1602   TRIG 218 (H) 08/21/2023 1602   HDL 44 08/21/2023 1602   CHOLHDL 4.7 (H) 08/21/2023 1602   CHOLHDL 4 10/28/2020 1042   VLDL 24.2 10/28/2020 1042   LDLCALC 124 (H) 08/21/2023 1602    Physical Exam:    VS:  BP 128/68 (BP Location: Left Arm, Patient Position: Sitting)   Pulse 65   Ht 5' 7.5" (1.715 m)   Wt 227 lb 12.8 oz (103.3 kg)   SpO2 95%   BMI 35.15 kg/m     Wt Readings from Last 3 Encounters:  09/04/23 227 lb 12.8 oz (103.3 kg)  08/21/23 228 lb (103.4 kg)  07/04/23 195 lb (88.5 kg)     GEN:  Well nourished, well developed in no acute distress HEENT: Normal NECK: No JVD; No carotid bruits LYMPHATICS: No lymphadenopathy CARDIAC: RRR, no murmurs, no rubs, no gallops RESPIRATORY:  Clear to  auscultation without rales, wheezing or rhonchi  ABDOMEN: Soft, non-tender, non-distended MUSCULOSKELETAL:  No edema; No deformity  SKIN: Warm and dry NEUROLOGIC:  Alert and oriented x 3 PSYCHIATRIC:  Normal affect   ASSESSMENT:    1. Hypertension, unspecified type   2. Palpitations   3. Obstructive sleep apnea syndrome   4. Dyslipidemia    PLAN:    In order of problems listed above:  Palpitations: Will ask her to wear Zio patch for 2 weeks to see if she get any significant arrhythmia. Dyspnea on exertion echocardiogram to  be done to assess left ventricular ejection fraction.  She does have some swelling of lower extremities we will look for right ventricle pressure as well as function. Snoring at night with some choking in the middle of the night I strongly suspect obstructive sleep apnea will do sleep study. Dyslipidemia I did review K PN which show me LDL 124 HDL 44.  We had a long discussion about this I strongly recommended to do calcium score to assess her risk and potentially initiate therapy. I see her back in a few months   Medication Adjustments/Labs and Tests Ordered: Current medicines are reviewed at length with the patient today.  Concerns regarding medicines are outlined above.  Orders Placed This Encounter  Procedures   EKG 12-Lead   No orders of the defined types were placed in this encounter.   Signed, Georgeanna Lea, MD, [facility]. 09/04/2023 4:19 PM    [facility]

## 2023-09-04 NOTE — Addendum Note (Signed)
Addended by: Baldo Ash D on: 09/04/2023 04:33 PM   Modules accepted: Orders

## 2023-09-04 NOTE — Telephone Encounter (Signed)
Patient agreement reviewed and signed on 09/04/2023.  WatchPAT issued to patient on 09/04/2023 by Neena Rhymes, RN. Patient aware to not open the WatchPAT box until contacted with the activation PIN. Patient profile initialized in CloudPAT on 09/04/2023 by Misty Stanley D. Tiburcio Pea, Charity fundraiser. Device serial number: [id number]  Please list Reason for Call as Advice Only and type "WatchPAT issued to patient" in the comment box.

## 2023-09-04 NOTE — Patient Instructions (Signed)
Medication Instructions:  Your physician recommends that you continue on your current medications as directed. Please refer to the Current Medication list given to you today.  *If you need a refill on your cardiac medications before your next appointment, please call your pharmacy*   Lab Work: None Ordered If you have labs (blood work) drawn today and your tests are completely normal, you will receive your results only by: MyChart Message (if you have MyChart) OR A paper copy in the mail If you have any lab test that is abnormal or we need to change your treatment, we will call you to review the results.   Testing/Procedures:  WHY IS MY DOCTOR PRESCRIBING ZIO? The Zio system is proven and trusted by physicians to detect and diagnose irregular heart rhythms -- and has been prescribed to hundreds of thousands of patients.  The FDA has cleared the Zio system to monitor for many different kinds of irregular heart rhythms. In a study, physicians were able to reach a diagnosis 90% of the time with the Zio system1.  You can wear the Zio monitor -- a small, discreet, comfortable patch -- during your normal day-to-day activity, including while you sleep, shower, and exercise, while it records every single heartbeat for analysis.  1Barrett, P., et al. Comparison of 24 Hour Holter Monitoring Versus 14 Day Novel Adhesive Patch Electrocardiographic Monitoring. American Journal of Medicine, 2014.  ZIO VS. HOLTER MONITORING The Zio monitor can be comfortably worn for up to 14 days. Holter monitors can be worn for 24 to 48 hours, limiting the time to record any irregular heart rhythms you may have. Zio is able to capture data for the 51% of patients who have their first symptom-triggered arrhythmia after 48 hours.1  LIVE WITHOUT RESTRICTIONS The Zio ambulatory cardiac monitor is a small, unobtrusive, and water-resistant patch--you might even forget you're wearing it. The Zio monitor records and stores  every beat of your heart, whether you're sleeping, working out, or showering.   Your physician has requested that you have an echocardiogram. Echocardiography is a painless test that uses sound waves to create images of your heart. It provides your doctor with information about the size and shape of your heart and how well your heart's chambers and valves are working. This procedure takes approximately one hour. There are no restrictions for this procedure. Please do NOT wear cologne, perfume, aftershave, or lotions (deodorant is allowed). Please arrive 15 minutes prior to your appointment time.  We will order CT coronary calcium score. It will cost $99.00 and is not covered by insurance.  Please call to schedule.     [facility] [address] [place], [place] [address] [phone]    [facility] Sleep Study- Will call when Authorized by insurance to begin.     Follow-Up: At [facility], you and your health needs are our priority.  As part of our continuing mission to provide you with exceptional heart care, we have created designated Provider Care Teams.  These Care Teams include your primary Cardiologist (physician) and Advanced Practice Providers (APPs -  Physician Assistants and Nurse Practitioners) who all work together to provide you with the care you need, when you need it.  We recommend signing up for the patient portal called "MyChart".  Sign up information is provided on this After Visit Summary.  MyChart is used to connect with patients for Virtual Visits (Telemedicine).  Patients are able to view lab/test results, encounter notes, upcoming appointments, etc.  Non-urgent  messages can be sent to your provider as well.   To learn more about what you can do with MyChart, go to [email or web address].    Your next appointment:   2 month(s)  The format for your next appointment:   In Person  Provider:   Gypsy Balsam, MD    Other Instructions NA

## 2023-09-06 ENCOUNTER — Other Ambulatory Visit: Payer: Self-pay | Admitting: Family Medicine

## 2023-09-06 DIAGNOSIS — R928 Other abnormal and inconclusive findings on diagnostic imaging of breast: Secondary | ICD-10-CM

## 2023-09-12 ENCOUNTER — Telehealth: Payer: Self-pay | Admitting: *Deleted

## 2023-09-12 NOTE — Telephone Encounter (Signed)
Zio heart monitor fell off within 2 days of wear time. An order has been placed for a replacement device to be shipped to pt and hold put on billing so the pt won't be charged for 2nd device.

## 2023-09-20 ENCOUNTER — Encounter: Payer: Self-pay | Admitting: Cardiology

## 2023-09-26 ENCOUNTER — Ambulatory Visit
Payer: BC Managed Care – PPO | Attending: Student in an Organized Health Care Education/Training Program | Admitting: Student in an Organized Health Care Education/Training Program

## 2023-09-26 ENCOUNTER — Encounter: Payer: Self-pay | Admitting: Student in an Organized Health Care Education/Training Program

## 2023-09-26 VITALS — BP 159/97 | HR 80 | Temp 97.3°F | Resp 16 | Ht 68.0 in | Wt 220.0 lb

## 2023-09-26 DIAGNOSIS — M797 Fibromyalgia: Secondary | ICD-10-CM | POA: Diagnosis present

## 2023-09-26 DIAGNOSIS — M5416 Radiculopathy, lumbar region: Secondary | ICD-10-CM | POA: Diagnosis present

## 2023-09-26 DIAGNOSIS — G894 Chronic pain syndrome: Secondary | ICD-10-CM | POA: Diagnosis present

## 2023-09-26 DIAGNOSIS — M47816 Spondylosis without myelopathy or radiculopathy, lumbar region: Secondary | ICD-10-CM | POA: Insufficient documentation

## 2023-09-26 DIAGNOSIS — M170 Bilateral primary osteoarthritis of knee: Secondary | ICD-10-CM | POA: Diagnosis present

## 2023-09-26 DIAGNOSIS — M19011 Primary osteoarthritis, right shoulder: Secondary | ICD-10-CM | POA: Insufficient documentation

## 2023-09-26 MED ORDER — HYDROCODONE-ACETAMINOPHEN 7.5-325 MG PO TABS: 1.0000 | ORAL_TABLET | Freq: Three times a day (TID) | ORAL | 0 refills | Status: AC | PRN

## 2023-09-26 MED ORDER — HYDROCODONE-ACETAMINOPHEN 7.5-325 MG PO TABS: 1.0000 | ORAL_TABLET | Freq: Three times a day (TID) | ORAL | 0 refills | Status: DC | PRN

## 2023-09-26 MED ORDER — GABAPENTIN 600 MG PO TABS: 600.0000 mg | ORAL_TABLET | Freq: Every day | ORAL | 5 refills | Status: DC

## 2023-09-26 NOTE — Progress Notes (Signed)
Nursing Pain Medication Assessment:  Safety precautions to be maintained throughout the outpatient stay will include: orient to surroundings, keep bed in low position, maintain call bell within reach at all times, provide assistance with transfer out of bed and ambulation.  Medication Inspection Compliance: Pill count conducted under aseptic conditions, in front of the patient. Neither the pills nor the bottle was removed from the patient's sight at any time. Once count was completed pills were immediately returned to the patient in their original bottle.  Medication: Hydrocodone/APAP Pill/Patch Count:  25 of 90 pills remain Pill/Patch Appearance: Markings consistent with prescribed medication Bottle Appearance: Standard pharmacy container. Clearly labeled. Filled Date: 23 / 17 / 2024 Last Medication intake:  Yesterday

## 2023-09-26 NOTE — Patient Instructions (Signed)
______________________________________________________________________    Preparing for your procedure  Appointments: If you think you may not be able to keep your appointment, call 24-48 hours in advance to cancel. We need time to make it available to others.  During your procedure appointment there will be: No Prescription Refills. No disability issues to discussed. No medication changes or discussions.  Instructions: Food intake: You may eat or drink on morning of the procedure. Transportation: Unless otherwise stated by your physician, bring a driver. (Driver cannot be a Market researcher, Pharmacist, community, or any other form of public transportation.) Morning Medicines: Except for blood thinners, take all of your other morning medications with a sip of water. Make sure to take your heart and blood pressure medicines. If your blood pressure's lower number is above 100, the case will be rescheduled. Blood thinners: Make sure to stop your blood thinners as instructed.  If you take a blood thinner, but were not instructed to stop it, call our office [phone] and ask to talk to a nurse. Not stopping a blood thinner prior to certain procedures could lead to serious complications. Diabetics on insulin: Notify the staff so that you can be scheduled 1st case in the morning. If your diabetes requires high dose insulin, take only  of your normal insulin dose the morning of the procedure and notify the staff that you have done so. Preventing infections: Shower with an antibacterial soap the morning of your procedure.  Build-up your immune system: Take 1000 mg of Vitamin C with every meal (3 times a day) the day prior to your procedure. Antibiotics: Inform the nursing staff if you are taking any antibiotics or if you have any conditions that may require antibiotics prior to procedures. (Example: recent joint implants)   Pregnancy: If you are pregnant make sure to notify the nursing staff. Not doing so may result in  injury to the fetus, including death.  Sickness: If you have a cold, fever, or any active infections, call and cancel or reschedule your procedure. Receiving steroids while having an infection may result in complications. Arrival: You must be in the facility at least 30 minutes prior to your scheduled procedure. Tardiness: Your scheduled time is also the cutoff time. If you do not arrive at least 15 minutes prior to your procedure, you will be rescheduled.  Children: Do not bring any children with you. Make arrangements to keep them home. Dress appropriately: There is always a possibility that your clothing may get soiled. Avoid long dresses. Valuables: Do not bring any jewelry or valuables.  Reasons to call and reschedule or cancel your procedure: (Following these recommendations will minimize the risk of a serious complication.) Surgeries: Avoid having procedures within 2 weeks of any surgery. (Avoid for 2 weeks before or after any surgery). Flu Shots: Avoid having procedures within 2 weeks of a flu shots or . (Avoid for 2 weeks before or after immunizations). Barium: Avoid having a procedure within 7-10 days after having had a radiological study involving the use of radiological contrast. (Myelograms, Barium swallow or enema study). Heart attacks: Avoid any elective procedures or surgeries for the initial 6 months after a "Myocardial Infarction" (Heart Attack). Blood thinners: It is imperative that you stop these medications before procedures. Let us know if you if you take any blood thinner.  Infection: Avoid procedures during or within two weeks of an infection (including chest colds or gastrointestinal problems). Symptoms associated with infections include: Localized redness, fever, chills, night sweats or profuse sweating, burning sensation  when voiding, cough, congestion, stuffiness, runny nose, sore throat, diarrhea, nausea, vomiting, cold or Flu symptoms, recent or current infections. It is  specially important if the infection is over the area that we intend to treat. Heart and lung problems: Symptoms that may suggest an active cardiopulmonary problem include: cough, chest pain, breathing difficulties or shortness of breath, dizziness, ankle swelling, uncontrolled high or unusually low blood pressure, and/or palpitations. If you are experiencing any of these symptoms, cancel your procedure and contact your primary care physician for an evaluation.  Remember:  Regular Business hours are:  Monday to Thursday 8:00 AM to 4:00 PM  Provider's Schedule: Delano Metz, MD:  Procedure days: Tuesday and Thursday 7:30 AM to 4:00 PM  Edward Jolly, MD:  Procedure days: Monday and Wednesday 7:30 AM to 4:00 PM Last  Updated: 08/06/2023 ______________________________________________________________________

## 2023-09-26 NOTE — Progress Notes (Signed)
PROVIDER NOTE: Information contained herein reflects review and annotations entered in association with encounter. Interpretation of such information and data should be left to medically-trained personnel. Information provided to patient can be located elsewhere in the medical record under "Patient Instructions". Document created using STT-dictation technology, any transcriptional errors that may result from process are unintentional.    Patient: [name]  Service Category: E/M  Provider: Edward Jolly, MD  DOB: [birth year]  DOS: 09/26/2023  Referring Provider: Theadore Nan, NP  MRN: [id number]  Specialty: Interventional Pain Management  PCP: Theadore Nan, NP  Type: Established Patient  Setting: Ambulatory outpatient    Location: Office  Delivery: Face-to-face     HPI  [name], a 62 y.o. year old female, is here today because of her Primary osteoarthritis of right shoulder [M19.011]. Ms. Fross primary complain today is Joint Pain  Pertinent problems: Ms. Presby has Obesity; Arthritis of wrist; Chronic pain syndrome; Lumbar spondylosis; Bilateral primary osteoarthritis of knee; Bilateral hip pain; Chronic SI joint pain; Lumbar radiculopathy; Long-term current use of opiate analgesic; Right rotator cuff tear arthropathy; Chronic right shoulder pain; Fibromyalgia; and Cervical radicular pain (left) on their pertinent problem list. Pain Assessment: Severity of Chronic pain is reported as a 9 /10. Location:  (Joint pain; Right shoulder, lower back, knees bilat)  / . Onset: More than a month ago. Quality: Aching, Sore. Timing: Constant. Modifying factor(s):  Marland Kitchen Vitals:  height is 5\' 8"  (1.727 m) and weight is 220 lb (99.8 kg). Her temperature is 97.3 F (36.3 C) (abnormal). Her blood pressure is 159/97 (abnormal) and her pulse is 80. Her respiration is 16 and oxygen saturation is 99%.  BMI: Estimated body mass index is 33.45 kg/m as calculated from the following:   Height as  of this encounter: 5\' 8"  (1.727 m).   Weight as of this encounter: 220 lb (99.8 kg). Last encounter: 07/04/2023. Last procedure: 05/29/2023.  Reason for encounter:   Increased right shoulder pain secondary to glenohumeral arthritis.  Status post right glenohumeral joint injection under fluoroscopy in 2022 I provided her with 80% pain relief for almost 2 years.  We discussed repeating that. Worsening bilateral knee pain, right greater than left related to severe right knee osteoarthritis.  We have tried steroid injection, gel injection as well as genicular nerve block which unfortunately were not helpful.  She would like to see orthopedic surgery to discuss right knee replacement. refill of gabapentin and hydrocodone as below.  No change in dose.  We will renew our annual urine toxicology screen.  Pharmacotherapy Assessment  Analgesic: hydrocodone 7.5 mg every 8 hours as needed, quantity 90/month    Monitoring: [place] PMP: PDMP reviewed during this encounter.       Pharmacotherapy: No side-effects or adverse reactions reported. Compliance: No problems identified. Effectiveness: Clinically acceptable.  Nonah Mattes, RN  09/26/2023  8:57 AM  Sign when Signing Visit Nursing Pain Medication Assessment:  Safety precautions to be maintained throughout the outpatient stay will include: orient to surroundings, keep bed in low position, maintain call bell within reach at all times, provide assistance with transfer out of bed and ambulation.  Medication Inspection Compliance: Pill count conducted under aseptic conditions, in front of the patient. Neither the pills nor the bottle was removed from the patient's sight at any time. Once count was completed pills were immediately returned to the patient in their original bottle.  Medication: Hydrocodone/APAP Pill/Patch Count:  25 of 90 pills remain Pill/Patch Appearance: Markings  consistent with prescribed medication Bottle Appearance: Standard pharmacy  container. Clearly labeled. Filled Date: 49 / 17 / 2024 Last Medication intake:  Yesterday    No results found for: "CBDTHCR" No results found for: "D8THCCBX" No results found for: "D9THCCBX"  UDS:  Summary  Date Value Ref Range Status  07/03/2022 Note  Final    Comment:    ==================================================================== [employer] Select 13 (MW) ==================================================================== Test                             Result       Flag       Units  Drug Present and Declared for Prescription Verification   Hydrocodone                    607          EXPECTED   ng/mg creat   Hydromorphone                  99           EXPECTED   ng/mg creat   Dihydrocodeine                 140          EXPECTED   ng/mg creat   Norhydrocodone                 729          EXPECTED   ng/mg creat    Sources of hydrocodone include scheduled prescription medications.    Hydromorphone, dihydrocodeine and norhydrocodone are expected    metabolites of hydrocodone. Hydromorphone and dihydrocodeine are    also available as scheduled prescription medications.  ==================================================================== Test                      Result    Flag   Units      Ref Range   Creatinine              97               mg/dL      >=16 ==================================================================== Declared Medications:  The flagging and interpretation on this report are based on the  following declared medications.  Unexpected results may arise from  inaccuracies in the declared medications.   **Note: The testing scope of this panel includes these medications:   Hydrocodone (Norco)   **Note: The testing scope of this panel does not include the  following reported medications:   Acetaminophen (Norco)  Gabapentin (Neurontin)  Melatonin  Multivitamin  Omeprazole (Prilosec)  Valacyclovir  (Valtrex) ==================================================================== For clinical consultation, please call [phone]. ====================================================================       ROS  Constitutional: Denies any fever or chills Gastrointestinal: No reported hemesis, hematochezia, vomiting, or acute GI distress Musculoskeletal:  Right shoulder, right greater than left knee pain Neurological: No reported episodes of acute onset apraxia, aphasia, dysarthria, agnosia, amnesia, paralysis, loss of coordination, or loss of consciousness  Medication Review  HYDROcodone-acetaminophen, Melatonin, gabapentin, multivitamin, and valACYclovir  History Review  Allergy: Ms. Rubi has No Known Allergies. Drug: Ms. Buckmaster  reports no history of drug use. Alcohol:  reports that she does not currently use alcohol. Tobacco:  reports that she quit smoking about 20 years ago. Her smoking use included cigarettes. She has never used smokeless tobacco. Social: Ms. Stocum  reports that she quit smoking about 20 years ago. Her  smoking use included cigarettes. She has never used smokeless tobacco. She reports that she does not currently use alcohol. She reports that she does not use drugs. Medical:  has a past medical history of Arthritis, Degenerative disorder of bone, Genuine stress incontinence, female (2012), GERD (gastroesophageal reflux disease), Migraines, and Obesity. Surgical: Ms. Blayney  has a past surgical history that includes Laparoscopic hysterectomy (10/23/2010); Midurethral sling (05/02/2011); and excision of vulvar lesion (05/02/2011). Family: family history includes Arthritis in her maternal grandmother and mother; Cancer in her father and paternal grandfather; Diabetes in her son; Heart attack in her maternal grandfather, mother, and paternal grandmother; Hypertension in her mother.  Laboratory Chemistry Profile   Renal Lab Results  Component Value Date   BUN 10  08/21/2023   CREATININE 0.83 08/21/2023   BCR 12 08/21/2023   GFR 74.95 02/09/2021   GFRAA 92 06/16/2018   GFRNONAA >60 02/24/2021    Hepatic Lab Results  Component Value Date   AST 25 08/21/2023   ALT 36 (H) 08/21/2023   ALBUMIN 4.3 08/21/2023   ALKPHOS 100 08/21/2023    Electrolytes Lab Results  Component Value Date   NA 140 08/21/2023   K 4.1 08/21/2023   CL 104 08/21/2023   CALCIUM 9.7 08/21/2023   MG 1.8 01/30/2019    Bone Lab Results  Component Value Date   VD25OH 54.0 08/21/2023    Inflammation (CRP: Acute Phase) (ESR: Chronic Phase) No results found for: "CRP", "ESRSEDRATE", "LATICACIDVEN"       Note: Above Lab results reviewed.  Recent Imaging Review  MM 3D SCREENING MAMMOGRAM BILATERAL BREAST CLINICAL DATA:  Screening.  EXAM: DIGITAL SCREENING BILATERAL MAMMOGRAM WITH TOMOSYNTHESIS AND CAD  TECHNIQUE: Bilateral screening digital craniocaudal and mediolateral oblique mammograms were obtained. Bilateral screening digital breast tomosynthesis was performed. The images were evaluated with computer-aided detection.  COMPARISON:  Previous exam(s).  ACR Breast Density Category b: There are scattered areas of fibroglandular density.  FINDINGS: In the right breast an asymmetry requires further evaluation.  In the left breast distortion requires further evaluation.  IMPRESSION: Further evaluation is suggested for possible asymmetry in the right breast.  Further evaluation is suggested for possible distortion in the left breast.  RECOMMENDATION: Diagnostic mammogram and possibly ultrasound of both breasts. (Code:[id number])  The patient will be contacted regarding the findings, and additional imaging will be scheduled.  BI-RADS CATEGORY  0: Incomplete: Need additional imaging evaluation.  Electronically Signed   By: Edwin Cap M.D.   On: 09/05/2023 15:33 Note: Reviewed        Physical Exam  General appearance: Well nourished, well  developed, and well hydrated. In no apparent acute distress Mental status: Alert, oriented x 3 (person, place, & time)       Respiratory: No evidence of acute respiratory distress Eyes: PERLA Vitals: BP (!) 159/97   Pulse 80   Temp (!) 97.3 F (36.3 C)   Resp 16   Ht 5\' 8"  (1.727 m)   Wt 220 lb (99.8 kg)   SpO2 99%   BMI 33.45 kg/m  BMI: Estimated body mass index is 33.45 kg/m as calculated from the following:   Height as of this encounter: 5\' 8"  (1.727 m).   Weight as of this encounter: 220 lb (99.8 kg). Ideal: Ideal body weight: 63.9 kg (140 lb 14 oz) Adjusted ideal body weight: 78.3 kg (172 lb 8.4 oz)  Upper Extremity (UE) Exam    Side: Right upper extremity  Side: Left upper extremity  Skin &  Extremity Inspection: Skin color, temperature, and hair growth are WNL. No peripheral edema or cyanosis. No masses, redness, swelling, asymmetry, or associated skin lesions. No contractures.  Skin & Extremity Inspection: Skin color, temperature, and hair growth are WNL. No peripheral edema or cyanosis. No masses, redness, swelling, asymmetry, or associated skin lesions. No contractures.  Functional ROM: Pain restricted ROM for shoulder  Functional ROM: Unrestricted ROM          Muscle Tone/Strength: Functionally intact. No obvious neuro-muscular anomalies detected.  Muscle Tone/Strength: Functionally intact. No obvious neuro-muscular anomalies detected.  Sensory (Neurological): Unimpaired          Sensory (Neurological): Unimpaired          Palpation: No palpable anomalies              Palpation: No palpable anomalies              Provocative Test(s):  Phalen's test: deferred Tinel's test: deferred Apley's scratch test (touch opposite shoulder):  Action 1 (Across chest): Decreased ROM Action 2 (Overhead): Decreased ROM Action 3 (LB reach): Decreased ROM   Provocative Test(s):  Phalen's test: deferred Tinel's test: deferred Apley's scratch test (touch opposite shoulder):  Action 1  (Across chest): deferred Action 2 (Overhead): deferred Action 3 (LB reach): deferred    Right knee pain, worse with weightbearing  Assessment   Diagnosis Status  1. Primary osteoarthritis of right shoulder   2. Bilateral primary osteoarthritis of knee   3. Lumbar spondylosis   4. Chronic pain syndrome   5. Lumbar facet arthropathy   6. Lumbar facetogenic pain   7. Fibromyalgia    Having a Flare-up Deteriorating Stable    Plan of Care    [name] has a current medication list which includes the following long-term medication(s): gabapentin.  1. Primary osteoarthritis of right shoulder - SHOULDER INJECTION; Future  2. Bilateral primary osteoarthritis of knee - AMB referral to orthopedics  3. Lumbar spondylosis - gabapentin (NEURONTIN) 600 MG tablet; Take 1 tablet (600 mg total) by mouth at bedtime. 300 mg qday, 600 mg qhs  Dispense: 30 tablet; Refill: 5  4. Chronic pain syndrome - SHOULDER INJECTION; Future - AMB referral to orthopedics - gabapentin (NEURONTIN) 600 MG tablet; Take 1 tablet (600 mg total) by mouth at bedtime. 300 mg qday, 600 mg qhs  Dispense: 30 tablet; Refill: 5 - Tox[employer]SU[employer] Select 13 (MW), Urine  5. Lumbar facet arthropathy - gabapentin (NEURONTIN) 600 MG tablet; Take 1 tablet (600 mg total) by mouth at bedtime. 300 mg qday, 600 mg qhs  Dispense: 30 tablet; Refill: 5  6. Lumbar facetogenic pain - gabapentin (NEURONTIN) 600 MG tablet; Take 1 tablet (600 mg total) by mouth at bedtime. 300 mg qday, 600 mg qhs  Dispense: 30 tablet; Refill: 5  7. Fibromyalgia - gabapentin (NEURONTIN) 600 MG tablet; Take 1 tablet (600 mg total) by mouth at bedtime. 300 mg qday, 600 mg qhs  Dispense: 30 tablet; Refill: 5    Pharmacotherapy (Medications Ordered): Meds ordered this encounter  Medications   HYDROcodone-acetaminophen (NORCO) 7.5-325 MG tablet    Sig: Take 1 tablet by mouth every 8 (eight) hours as needed for moderate pain.    Dispense:  90  tablet    Refill:  0   HYDROcodone-acetaminophen (NORCO) 7.5-325 MG tablet    Sig: Take 1 tablet by mouth every 8 (eight) hours as needed for moderate pain.    Dispense:  90 tablet    Refill:  0  HYDROcodone-acetaminophen (NORCO) 7.5-325 MG tablet    Sig: Take 1 tablet by mouth every 8 (eight) hours as needed for moderate pain.    Dispense:  90 tablet    Refill:  0   gabapentin (NEURONTIN) 600 MG tablet    Sig: Take 1 tablet (600 mg total) by mouth at bedtime. 300 mg qday, 600 mg qhs    Dispense:  30 tablet    Refill:  5   Orders:  Orders Placed This Encounter  Procedures   SHOULDER INJECTION    Standing Status:   Future    Standing Expiration Date:   12/27/2023    Scheduling Instructions:     Procedure: Intra-articular shoulder (Glenohumeral) joint injection     Side:RIGHT     Level: Glenohumeral joint               Sedation: PO Valium     Timeframe: As permitted by the schedule    Order Specific Question:   Where will this procedure be performed?    Answer:   [facility]   [employer] Select 13 (MW), Urine    Volume: 30 ml(s). Minimum 3 ml of urine is needed. Document temperature of fresh sample. Indications: Long term (current) use of opiate analgesic (Z79.891)    Order Specific Question:   Release to patient    Answer:   Immediate   AMB referral to orthopedics    Referral Priority:   Routine    Referral Type:   Consultation    Referred to Provider:   Huel Cote, MD    Number of Visits Requested:   1   Follow-up plan:   Return in about 13 days (around 10/09/2023) for Right shoulder injection , in clinic (PO Valium).      s/p IA hyalgan #1 on 08/19/2019, #2 09/23/2019 not very effective unfortunately.  Lumbar MRI shows facet disease at L3, L4, L5 bilaterally.  Bilateral L3, L4, L5 medial branch nerve block #1; right glenohumeral shoulder joint injection (anterior approach) 10/19/2020, 03/13/2021               Recent Visits Date Type Provider Dept  07/04/23  Office Visit Edward Jolly, MD [facility]  Showing recent visits within past 90 days and meeting all other requirements Today's Visits Date Type Provider Dept  09/26/23 Office Visit Edward Jolly, MD [facility]  Showing today's visits and meeting all other requirements Future Appointments No visits were found meeting these conditions. Showing future appointments within next 90 days and meeting all other requirements  I discussed the assessment and treatment plan with the patient. The patient was provided an opportunity to ask questions and all were answered. The patient agreed with the plan and demonstrated an understanding of the instructions.  Patient advised to call back or seek an in-person evaluation if the symptoms or condition worsens.  Duration of encounter: .  Total time on encounter, as per AMA guidelines included both the face-to-face and non-face-to-face time personally spent by the physician and/or other qualified health care professional(s) on the day of the encounter (includes time in activities that require the physician or other qualified health care professional and does not include time in activities normally performed by clinical staff). Physician's time may include the following activities when performed: Preparing to see the patient (e.g., pre-charting review of records, searching for previously ordered imaging, lab work, and nerve conduction tests) Review of prior analgesic pharmacotherapies. Reviewing PMP Interpreting ordered tests (e.g., lab work, imaging,  nerve conduction tests) Performing post-procedure evaluations, including interpretation of diagnostic procedures Obtaining and/or reviewing separately obtained history Performing a medically appropriate examination and/or evaluation Counseling and educating the patient/family/caregiver Ordering medications, tests, or procedures Referring and communicating with other health care  professionals (when not separately reported) Documenting clinical information in the electronic or other health record Independently interpreting results (not separately reported) and communicating results to the patient/ family/caregiver Care coordination (not separately reported)  Note by: Edward Jolly, MD Date: 09/26/2023; Time: 10:45 AM

## 2023-10-02 ENCOUNTER — Ambulatory Visit
Admission: RE | Admit: 2023-10-02 | Discharge: 2023-10-02 | Disposition: A | Discharge status: Home / Self Care | Payer: BC Managed Care – PPO | Source: Ambulatory Visit | Attending: Family Medicine | Admitting: Family Medicine

## 2023-10-02 ENCOUNTER — Other Ambulatory Visit: Payer: Self-pay | Admitting: Family Medicine

## 2023-10-02 DIAGNOSIS — R928 Other abnormal and inconclusive findings on diagnostic imaging of breast: Secondary | ICD-10-CM

## 2023-10-02 DIAGNOSIS — N6489 Other specified disorders of breast: Secondary | ICD-10-CM

## 2023-10-02 LAB — TOXASSURE SELECT 13 (MW), URINE

## 2023-10-10 ENCOUNTER — Ambulatory Visit: Payer: BC Managed Care – PPO | Attending: Cardiology

## 2023-10-10 DIAGNOSIS — R0609 Other forms of dyspnea: Secondary | ICD-10-CM | POA: Diagnosis not present

## 2023-10-10 LAB — ECHOCARDIOGRAM COMPLETE
Area-P 1/2: 3.29 cm2
S' Lateral: 3.1 cm

## 2023-10-11 ENCOUNTER — Ambulatory Visit (HOSPITAL_BASED_OUTPATIENT_CLINIC_OR_DEPARTMENT_OTHER): Payer: BC Managed Care – PPO | Admitting: Orthopaedic Surgery

## 2023-10-11 ENCOUNTER — Ambulatory Visit (HOSPITAL_BASED_OUTPATIENT_CLINIC_OR_DEPARTMENT_OTHER)
Admission: RE | Admit: 2023-10-11 | Discharge: 2023-10-11 | Disposition: A | Discharge status: Home / Self Care | Payer: BC Managed Care – PPO | Source: Ambulatory Visit | Attending: Orthopaedic Surgery | Admitting: Radiology

## 2023-10-11 ENCOUNTER — Ambulatory Visit
Admission: RE | Admit: 2023-10-11 | Discharge: 2023-10-11 | Disposition: A | Discharge status: Home / Self Care | Payer: BC Managed Care – PPO | Source: Ambulatory Visit | Attending: Family Medicine | Admitting: Family Medicine

## 2023-10-11 ENCOUNTER — Telehealth: Payer: Self-pay

## 2023-10-11 DIAGNOSIS — N6489 Other specified disorders of breast: Secondary | ICD-10-CM

## 2023-10-11 DIAGNOSIS — M170 Bilateral primary osteoarthritis of knee: Secondary | ICD-10-CM | POA: Insufficient documentation

## 2023-10-11 DIAGNOSIS — M1711 Unilateral primary osteoarthritis, right knee: Secondary | ICD-10-CM | POA: Diagnosis not present

## 2023-10-11 HISTORY — PX: BREAST BIOPSY: SHX20

## 2023-10-11 NOTE — Telephone Encounter (Addendum)
**Note De-Identified Garland Smouse Obfuscation** Ordering provider: Dr Bing Matter Associated diagnoses: Snoring-R06.83 and Breath holding Episodes-R06.89 WatchPAT PA obtained on 10/11/2023 by Arlicia Paquette, Lorelle Formosa, LPN. Authorization: [employer] Order ID: [id number] with Vaild Dates:10/11/2023 - 12/09/2023 Patient notified of PIN (1234) on 10/11/2023 [name] Notification Method: phone/no answer so I left a detailed message on the pts VM (ok per DPR) advising her of the PIN # so she can proceed with her WatchPat One-HST. I did request that she do this HST within 2 weeks from today's date, if possible and that if she has any questions or concerns to call us back.  Phone note routed to covering staff for follow-up.

## 2023-10-11 NOTE — Progress Notes (Signed)
Chief Complaint: Right worse than left knee pain     History of Present Illness:    AILANA Huynh is a 62 y.o. female presents today with ongoing bilateral knee pain right worse than left which has been very significant and occurring since she has been 62 years old.  She has seen multiple doctors including both Dr. Thomasena Edis and Dr. Antony Odea.  Both of them were provided multiple injections into both knees.  Most recently she has been seeing Dr. Lourdes Sledge who is recommended steroid injections again with no relief as well as gel injections.  She got no significant relief from these as well.  She has previously had fluid taken out of both knees which has helped very temporarily.  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    Surgical History:   None  PMH/PSH/Family History/Social History/Meds/Allergies:    Past Medical History:  Diagnosis Date   Arthritis    Degenerative disorder of bone    Genuine stress incontinence, female 2012   patient had surgical repair   GERD (gastroesophageal reflux disease)    Migraines    Obesity    Past Surgical History:  Procedure Laterality Date   excision of vulvar lesion  05/02/2011   LAPAROSCOPIC HYSTERECTOMY  10/23/2010   mennorhagia/fibroids   Midurethral sling  05/02/2011   Social History   Socioeconomic History   Marital status: Legally Separated    Spouse name: Not on file   Number of children: Not on file   Years of education: Not on file   Highest education level: Not on file  Occupational History   Not on file  Tobacco Use   Smoking status: Former    Current packs/day: 0.00    Types: Cigarettes    Quit date: 01/28/2003    Years since quitting: 20.7   Smokeless tobacco: Never  Vaping Use   Vaping status: Never Used  Substance and Sexual Activity   Alcohol use: Not Currently    Comment: socially   Drug use: No   Sexual activity: Yes    Partners: Male     Birth control/protection: Surgical  Other Topics Concern   Not on file  Social History Narrative   Not on file   Social Determinants of Health   Financial Resource Strain: Not on file  Food Insecurity: Not on file  Transportation Needs: Not on file  Physical Activity: Not on file  Stress: Not on file  Social Connections: Not on file   Family History  Problem Relation Age of Onset   Heart attack Mother    Hypertension Mother    Arthritis Mother    Cancer Father        skin   Arthritis Maternal Grandmother    Heart attack Maternal Grandfather    Heart attack Paternal Grandmother    Cancer Paternal Grandfather        ear   Diabetes Son    Breast cancer Neg Hx    No Known Allergies Current Outpatient Medications  Medication Sig Dispense Refill   gabapentin (NEURONTIN) 600 MG tablet Take 1 tablet (600 mg total) by mouth at bedtime. 300 mg qday, 600 mg qhs 30 tablet 5   HYDROcodone-acetaminophen (NORCO) 7.5-325 MG tablet Take 1 tablet by mouth every 8 (  eight) hours as needed for moderate pain. 90 tablet 0   [START ON 11/02/2023] HYDROcodone-acetaminophen (NORCO) 7.5-325 MG tablet Take 1 tablet by mouth every 8 (eight) hours as needed for moderate pain. 90 tablet 0   [START ON 12/02/2023] HYDROcodone-acetaminophen (NORCO) 7.5-325 MG tablet Take 1 tablet by mouth every 8 (eight) hours as needed for moderate pain. 90 tablet 0   Melatonin 5 MG CAPS Take 1 capsule by mouth daily.     Multiple Vitamin (MULTIVITAMIN) tablet Take 1 tablet by mouth daily.     valACYclovir (VALTREX) 500 MG tablet Take 1 tablet (500 mg total) by mouth 2 (two) times daily. 60 tablet 1   No current facility-administered medications for this visit.   ECHOCARDIOGRAM COMPLETE  Result Date: 10/10/2023    ECHOCARDIOGRAM REPORT   Patient Name:   [name] Date of Exam: 10/10/2023 Medical Rec #:  [id number]         Height:       68.0 in Accession #:    [id number]        Weight:       220.0 lb Date of  Birth:  1961-07-01         BSA:          2.128 m Patient Age:    62 years          BP:           159/97 mmHg Patient Gender: F                 HR:           78 bpm. Exam Location:  [facility] Procedure: 2D Echo, Cardiac Doppler, Color Doppler and Strain Analysis Indications:    Dyspnea on exertion [R06.09 (ICD-10-CM)]  History:        Patient has no prior history of Echocardiogram examinations.                 Arrythmias:Tachycardia, Signs/Symptoms:Shortness of Breath; Risk                 Factors:Former Smoker, Hypertension and Dyslipidemia.  Sonographer:    Margreta Journey RDCS Referring Phys: [id number] ROBERT J KRASOWSKI IMPRESSIONS  1. Left ventricular ejection fraction, by estimation, is 60 to 65%. The left ventricle has normal function. The left ventricle has no regional wall motion abnormalities. Left ventricular diastolic parameters were normal. The average left ventricular global longitudinal strain is -20.0 %. The global longitudinal strain is normal.  2. Right ventricular systolic function is normal. The right ventricular size is normal.  3. The mitral valve is normal in structure. No evidence of mitral valve regurgitation. No evidence of mitral stenosis.  4. The aortic valve is normal in structure. Aortic valve regurgitation is not visualized. No aortic stenosis is present.  5. The inferior vena cava is normal in size with greater than 50% respiratory variability, suggesting right atrial pressure of 3 mmHg. FINDINGS  Left Ventricle: Left ventricular ejection fraction, by estimation, is 60 to 65%. The left ventricle has normal function. The left ventricle has no regional wall motion abnormalities. The average left ventricular global longitudinal strain is -20.0 %. The global longitudinal strain is normal. The left ventricular internal cavity size was normal in size. There is no left ventricular hypertrophy. Left ventricular diastolic parameters were normal. Right Ventricle: The right ventricular size is  normal. No increase in right ventricular wall thickness. Right ventricular systolic function is normal. Left Atrium: Left atrial size was normal in  size. Right Atrium: Right atrial size was normal in size. Pericardium: There is no evidence of pericardial effusion. Mitral Valve: The mitral valve is normal in structure. No evidence of mitral valve regurgitation. No evidence of mitral valve stenosis. Tricuspid Valve: The tricuspid valve is normal in structure. Tricuspid valve regurgitation is not demonstrated. No evidence of tricuspid stenosis. Aortic Valve: The aortic valve is normal in structure. Aortic valve regurgitation is not visualized. No aortic stenosis is present. Pulmonic Valve: The pulmonic valve was normal in structure. Pulmonic valve regurgitation is not visualized. No evidence of pulmonic stenosis. Aorta: The aortic root is normal in size and structure. Venous: The inferior vena cava is normal in size with greater than 50% respiratory variability, suggesting right atrial pressure of 3 mmHg. IAS/Shunts: No atrial level shunt detected by color flow Doppler.  LEFT VENTRICLE PLAX 2D LVIDd:         4.20 cm   Diastology LVIDs:         3.10 cm   LV e' medial:    10.00 cm/s LV PW:         1.00 cm   LV E/e' medial:  8.0 LV IVS:        1.00 cm   LV e' lateral:   9.25 cm/s LVOT diam:     2.00 cm   LV E/e' lateral: 8.6 LV SV:         90 LV SV Index:   42        2D Longitudinal Strain LVOT Area:     3.14 cm  2D Strain GLS Avg:     -20.0 %  RIGHT VENTRICLE             IVC RV Basal diam:  3.20 cm     IVC diam: 2.20 cm RV Mid diam:    2.40 cm RV S prime:     16.50 cm/s TAPSE (M-mode): 2.4 cm LEFT ATRIUM             Index        RIGHT ATRIUM           Index LA diam:        3.20 cm 1.50 cm/m   RA Area:     14.30 cm LA Vol (A2C):   39.3 ml 18.47 ml/m  RA Volume:   34.90 ml  16.40 ml/m LA Vol (A4C):   34.4 ml 16.16 ml/m LA Biplane Vol: 37.9 ml 17.81 ml/m  AORTIC VALVE LVOT Vmax:   135.50 cm/s LVOT Vmean:  88.500 cm/s  LVOT VTI:    0.287 m  AORTA Ao Root diam: 3.10 cm Ao Asc diam:  3.50 cm Ao Desc diam: 2.20 cm MITRAL VALVE MV Area (PHT): 3.29 cm    SHUNTS MV Decel Time: 231 msec    Systemic VTI:  0.29 m MV E velocity: 79.65 cm/s  Systemic Diam: 2.00 cm MV A velocity: 89.60 cm/s MV E/A ratio:  0.89 Gypsy Balsam MD Electronically signed by Gypsy Balsam MD Signature Date/Time: 10/10/2023/5:03:58 PM    Final     Review of Systems:   A ROS was performed including pertinent positives and negatives as documented in the HPI.  Physical Exam :   Constitutional: NAD and appears stated age Neurological: Alert and oriented Psych: Appropriate affect and cooperative There were no vitals taken for this visit.   Comprehensive Musculoskeletal Exam:    Tenderness about tricompartmental knees bilaterally.  There is crepitus and range of motion from 0  to 130 degrees.  There is positive joint line tenderness.  Imaging:   Xray (4 views right knee, 4 views left knee): There is evidence of degenerative changes predominantly involving the patellofemoral joints bilaterally although there is mild osteoarthritis involving the tibiofemoral joints    I personally reviewed and interpreted the radiographs.   Assessment:   62 y.o. female with bilateral knee osteoarthritis.  At this time she has had multiple surgeons who have stated that her arthritis or degenerative changes are not significant enough for arthroplasty.  That being said she has now had at least 5 injections into both knees including steroids and gel injections.  Unfortunately she is not getting any long-term relief from this.  This is dramatically inhibiting her ability to be active and she does have pain and swelling at the end of the day with crepitus.  Given this and the fact that she has trialed essentially everything short of surgery I do ultimately believe that she would be a candidate for total knee arthroplasty.  I will plan for referral to my joint  arthroplasty partners for discussion of this.  Plan :    -Plan for referral for possible right total knee arthroplasty     I personally saw and evaluated the patient, and participated in the management and treatment plan.  Huel Cote, MD Attending Physician, Orthopedic Surgery  This document was dictated using Dragon voice recognition software. A reasonable attempt at proof reading has been made to minimize errors.

## 2023-10-14 LAB — SURGICAL PATHOLOGY

## 2023-10-21 ENCOUNTER — Telehealth: Payer: Self-pay

## 2023-10-21 NOTE — Telephone Encounter (Signed)
Left message on My Chart with normal Monitor results per Dr. Vanetta Shawl note. Routed to PCP.

## 2023-10-22 ENCOUNTER — Telehealth: Payer: Self-pay

## 2023-10-22 ENCOUNTER — Encounter (INDEPENDENT_AMBULATORY_CARE_PROVIDER_SITE_OTHER): Payer: BC Managed Care – PPO | Admitting: Cardiology

## 2023-10-22 DIAGNOSIS — R0683 Snoring: Secondary | ICD-10-CM

## 2023-10-22 NOTE — Telephone Encounter (Signed)
Left message on My Chart with normal results per Dr. Krasowski's note. Routed to PCP. 

## 2023-10-24 ENCOUNTER — Telehealth: Payer: Self-pay

## 2023-10-24 ENCOUNTER — Telehealth: Payer: Self-pay | Admitting: *Deleted

## 2023-10-24 ENCOUNTER — Ambulatory Visit: Payer: BC Managed Care – PPO | Attending: Cardiology

## 2023-10-24 DIAGNOSIS — G4733 Obstructive sleep apnea (adult) (pediatric): Secondary | ICD-10-CM

## 2023-10-24 NOTE — Procedures (Signed)
   SLEEP STUDY REPORT Patient Information Study Date: 10/22/2023 Patient Name: [name] Patient ID: [id number] Birth Date: 1961-05-11 Age: 62 Gender: Female BMI: 35.6 (W=227 lb, H=5' 7'') Referring Physician: Gypsy Balsam, MD  TEST DESCRIPTION: Home sleep apnea testing was completed using the WatchPat, a Type 1 device, utilizing  peripheral arterial tonometry (PAT), chest movement, actigraphy, pulse oximetry, pulse rate, body position and snore.  AHI was calculated with apnea and hypopnea using valid sleep time as the denominator. RDI includes apneas,  hypopneas, and RERAs. The data acquired and the scoring of sleep and all associated events were performed in  accordance with the recommended standards and specifications as outlined in the AASM Manual for the Scoring of  Sleep and Associated Events 2.2.0 (2015).   FINDINGS: 1. No evidence of Obstructive Sleep Apnea with AHI 3.4/hr.  2. No Central Sleep Apnea. 3. Oxygen desaturations as low as 89%. 4. Moderate snoring was present. O2 sats were < 88% for 0 minutes. 5. Total sleep time was 7 hrs and 23 min. 6. 26.2% of total sleep time was spent in REM sleep.  7. Shortened sleep onset latency at 6 min.  8. Shortened REM sleep onset latency at 62 min.  9. Total awakenings were 11.   DIAGNOSIS:  Normal study with no significant sleep disordered breathing.  RECOMMENDATIONS: 1. Normal study with no significant sleep disordered breathing.  2. Healthy sleep recommendations include: adequate nightly sleep (normal 7-9 hrs/night), avoidance of caffeine after  noon and alcohol near bedtime, and maintaining a sleep environment that is cool, dark and quiet.  3. Weight loss for overweight patients is recommended.   4. Snoring recommendations include: weight loss where appropriate, side sleeping, and avoidance of alcohol before  bed.  5. Operation of motor vehicle or dangerous equipment must be avoided when feeling drowsy,  excessively sleepy, or  mentally fatigued.   6. An ENT consultation which may be useful for specific causes of and possible treatment of bothersome snoring .   7. Weight loss may be of benefit in reducing the severity of snoring.   Signature: Armanda Magic, MD; [facility]; Diplomat, American Board of Sleep  Medicine Electronically Signed: 10/24/2023 8:22:22 AM

## 2023-10-24 NOTE — Telephone Encounter (Signed)
The patient has been notified of the result via voicemail and her mychart.Latrelle Dodrill, CMA 10/24/2023 6:02 PM

## 2023-10-24 NOTE — Telephone Encounter (Signed)
-----   Message from Armanda Magic sent at 10/24/2023  8:23 AM EST ----- Please let patient know that sleep study showed no significant sleep apnea.

## 2023-10-24 NOTE — Telephone Encounter (Signed)
Pt viewed results on My Chart per Dr. Krasowski's note. Routed to PCP.  

## 2023-11-08 ENCOUNTER — Ambulatory Visit: Payer: BC Managed Care – PPO | Admitting: Cardiology

## 2023-11-13 ENCOUNTER — Ambulatory Visit: Payer: BC Managed Care – PPO | Admitting: Cardiology

## 2023-11-19 NOTE — Progress Notes (Addendum)
 " Cardiology Office Note:  .   Date:  11/21/2023  ID:  [name], DOB 1961/08/18, MRN [id number] PCP: Moishe Suzen LABOR, NP   [facility] Providers Cardiologist:  Lamar Fitch, MD    History of Present Illness: .   [name] is a 62 y.o. female with a past medical history of GERD, migraines, chronic pain, dyslipidemia.  10/10/2023 echo EF of 60 to 65%, no valvular abnormalities 09/04/2023 monitor average heart rate of 76 bpm, predominant rhythm sinus, 2 episodes of SVT, triggered events for APCs.  She established care with Dr. Krasowski on 09/04/2023 for evaluation of SOB and palpitations.  An echocardiogram was arranged revealing EF of 60 to 65%, no valvular abnormalities.  She underwent a sleep evaluation which was negative for OSA.  She wore a monitor which revealed an average heart rate of 76 bpm, predominant rhythm sinus, 2 episodes of SVT, triggered events for APCs.  A calcium score was arranged however this was not ultimately completed.  She presents today for follow-up after recent testing as outlined above, she is relieved that testing so far has been negative however still concerned as to why she is SOB and fatigued.  She states her symptoms started approximately a year ago, she notices her shortness of breath predominantly with exertion, activities that previously did not cause this.  She has a strong family history of CAD. She denies chest pain, palpitations, dyspnea, pnd, orthopnea, n, v, dizziness, syncope, edema, weight gain, or early satiety.    ROS: Review of Systems  Constitutional:  Positive for malaise/fatigue.  Respiratory:  Positive for shortness of breath.   Musculoskeletal:  Positive for joint pain.     Studies Reviewed: .        Cardiac Studies & Procedures       ECHOCARDIOGRAM  ECHOCARDIOGRAM COMPLETE 10/10/2023  Narrative ECHOCARDIOGRAM REPORT    Patient Name:   [name] Date of Exam: 10/10/2023 Medical Rec #:   [id number]         Height:       68.0 in Accession #:    [id number]        Weight:       220.0 lb Date of Birth:  04-30-1961         BSA:          2.128 m Patient Age:    62 years          BP:           159/97 mmHg Patient Gender: F                 HR:           78 bpm. Exam Location:  [facility]  Procedure: 2D Echo, Cardiac Doppler, Color Doppler and Strain Analysis  Indications:    Dyspnea on exertion [R06.09 (ICD-10-CM)]  History:        Patient has no prior history of Echocardiogram examinations. Arrythmias:Tachycardia, Signs/Symptoms:Shortness of Breath; Risk Factors:Former Smoker, Hypertension and Dyslipidemia.  Sonographer:    Charlie Jointer RDCS Referring Phys: [id number] ROBERT J KRASOWSKI  IMPRESSIONS   1. Left ventricular ejection fraction, by estimation, is 60 to 65%. The left ventricle has normal function. The left ventricle has no regional wall motion abnormalities. Left ventricular diastolic parameters were normal. The average left ventricular global longitudinal strain is -20.0 %. The global longitudinal strain is normal. 2. Right ventricular systolic function is normal. The right ventricular size is normal. 3. The mitral valve  is normal in structure. No evidence of mitral valve regurgitation. No evidence of mitral stenosis. 4. The aortic valve is normal in structure. Aortic valve regurgitation is not visualized. No aortic stenosis is present. 5. The inferior vena cava is normal in size with greater than 50% respiratory variability, suggesting right atrial pressure of 3 mmHg.  FINDINGS Left Ventricle: Left ventricular ejection fraction, by estimation, is 60 to 65%. The left ventricle has normal function. The left ventricle has no regional wall motion abnormalities. The average left ventricular global longitudinal strain is -20.0 %. The global longitudinal strain is normal. The left ventricular internal cavity size was normal in size. There is no left ventricular hypertrophy.  Left ventricular diastolic parameters were normal.  Right Ventricle: The right ventricular size is normal. No increase in right ventricular wall thickness. Right ventricular systolic function is normal.  Left Atrium: Left atrial size was normal in size.  Right Atrium: Right atrial size was normal in size.  Pericardium: There is no evidence of pericardial effusion.  Mitral Valve: The mitral valve is normal in structure. No evidence of mitral valve regurgitation. No evidence of mitral valve stenosis.  Tricuspid Valve: The tricuspid valve is normal in structure. Tricuspid valve regurgitation is not demonstrated. No evidence of tricuspid stenosis.  Aortic Valve: The aortic valve is normal in structure. Aortic valve regurgitation is not visualized. No aortic stenosis is present.  Pulmonic Valve: The pulmonic valve was normal in structure. Pulmonic valve regurgitation is not visualized. No evidence of pulmonic stenosis.  Aorta: The aortic root is normal in size and structure.  Venous: The inferior vena cava is normal in size with greater than 50% respiratory variability, suggesting right atrial pressure of 3 mmHg.  IAS/Shunts: No atrial level shunt detected by color flow Doppler.   LEFT VENTRICLE PLAX 2D LVIDd:         4.20 cm   Diastology LVIDs:         3.10 cm   LV e' medial:    10.00 cm/s LV PW:         1.00 cm   LV E/e' medial:  8.0 LV IVS:        1.00 cm   LV e' lateral:   9.25 cm/s LVOT diam:     2.00 cm   LV E/e' lateral: 8.6 LV SV:         90 LV SV Index:   42        2D Longitudinal Strain LVOT Area:     3.14 cm  2D Strain GLS Avg:     -20.0 %   RIGHT VENTRICLE             IVC RV Basal diam:  3.20 cm     IVC diam: 2.20 cm RV Mid diam:    2.40 cm RV S prime:     16.50 cm/s TAPSE (M-mode): 2.4 cm  LEFT ATRIUM             Index        RIGHT ATRIUM           Index LA diam:        3.20 cm 1.50 cm/m   RA Area:     14.30 cm LA Vol (A2C):   39.3 ml 18.47 ml/m  RA Volume:    34.90 ml  16.40 ml/m LA Vol (A4C):   34.4 ml 16.16 ml/m LA Biplane Vol: 37.9 ml 17.81 ml/m AORTIC VALVE LVOT Vmax:   135.50 cm/s  LVOT Vmean:  88.500 cm/s LVOT VTI:    0.287 m  AORTA Ao Root diam: 3.10 cm Ao Asc diam:  3.50 cm Ao Desc diam: 2.20 cm  MITRAL VALVE MV Area (PHT): 3.29 cm    SHUNTS MV Decel Time: 231 msec    Systemic VTI:  0.29 m MV E velocity: 79.65 cm/s  Systemic Diam: 2.00 cm MV A velocity: 89.60 cm/s MV E/A ratio:  0.89  Lamar Fitch MD Electronically signed by Lamar Fitch MD Signature Date/Time: 10/10/2023/5:03:58 PM    Final    MONITORS  LONG TERM MONITOR (3-14 DAYS) 09/30/2023  Narrative Patch Wear Time:  8 days and 5 hours (2024-09-18T16:46:40-0400 to 2024-10-06T10:06:31-0400)  Monitor 1 Patient had a min HR of 51 bpm, max HR of 141 bpm, and avg HR of 76 bpm. Predominant underlying rhythm was Sinus Rhythm. Isolated SVEs were rare (<1.0%), and no SVE Couplets or SVE Triplets were present. Isolated VEs were rare (<1.0%), and no VE Couplets or VE Triplets were present.  Monitor 2 Patient had a min HR of 50 bpm, max HR of 142 bpm, and avg HR of 76 bpm. Predominant underlying rhythm was Sinus Rhythm. 2 Supraventricular Tachycardia runs occurred, the run with the fastest interval lasting 11 beats with a max rate of 132 bpm (avg 105 bpm); the run with the fastest interval was also the longest. Some episodes of Supraventricular Tachycardia may be possible Atrial Tachycardia with variable block. Isolated SVEs were rare (<1.0%), SVE Couplets were rare (<1.0%), and no SVE Triplets were present. Isolated VEs were rare (<1.0%), and no VE Couplets or VE Triplets were present.  Summary and conclusions: 2 episode of supraventricular tachycardia longest episode 11 beats at rate of 132. Triggered event showing some APCs           Risk Assessment/Calculations:             Physical Exam:   VS:  BP 118/78   Pulse 70   Ht 5' 8 (1.727 m)   Wt 224 lb  3.2 oz (101.7 kg)   SpO2 96%   BMI 34.09 kg/m    Wt Readings from Last 3 Encounters:  11/21/23 224 lb 3.2 oz (101.7 kg)  09/26/23 220 lb (99.8 kg)  09/04/23 227 lb 12.8 oz (103.3 kg)    GEN: Well nourished, well developed in no acute distress NECK: No JVD; No carotid bruits CARDIAC: RRR, no murmurs, rubs, gallops RESPIRATORY:  Clear to auscultation without rales, wheezing or rhonchi  ABDOMEN: Soft, non-tender, non-distended EXTREMITIES:  No edema; No deformity   ASSESSMENT AND PLAN: .   DOE -this has been bothersome for her for approximately a year, most noticeable when she is walking her dog or across parking lot at work.  She has not had any significant changes to her medical history, no weight gain etc., she has a strong family history of coronary artery disease.  Her echo was normal and without contributory causes, monitor revealed episodes of SVT however no contributory causes.  Her DOE could be an anginal equivalent, we discussed proceeding with a coronary CTA and she is agreeable.  Will check LPA, CMET.   Palpitations-monitor revealed episodes of SVT, she does not want to take any medication for this if it is not needed.  Dyslipidemia-most recent LDL was elevated at 124, she should be on statin therapy however it appears she recently had some abnormal LFTs and her gynecologist has arranged for her to have repeat blood work today.  We  will hold off on initiating statin therapy/recommendations until we get her coronary CTA.  Snoring - previously evaluated for OSA with an at home sleep evaluation, which was negative, however she feels the night she wore it was not reflective of how she normally sleeps. Will reach out to Dr. Shlomo to see if further evaluation is warranted.        Dispo: Coronary CTA, LPA, follow-up in 6 months--sooner depending on testing.  Signed, Delon JAYSON Hoover, NP  "

## 2023-11-20 ENCOUNTER — Other Ambulatory Visit: Payer: Self-pay | Admitting: Family Medicine

## 2023-11-20 DIAGNOSIS — M898X9 Other specified disorders of bone, unspecified site: Secondary | ICD-10-CM | POA: Insufficient documentation

## 2023-11-20 DIAGNOSIS — G43909 Migraine, unspecified, not intractable, without status migrainosus: Secondary | ICD-10-CM | POA: Insufficient documentation

## 2023-11-20 DIAGNOSIS — Z09 Encounter for follow-up examination after completed treatment for conditions other than malignant neoplasm: Secondary | ICD-10-CM

## 2023-11-20 DIAGNOSIS — M1990 Unspecified osteoarthritis, unspecified site: Secondary | ICD-10-CM | POA: Insufficient documentation

## 2023-11-21 ENCOUNTER — Encounter: Payer: Self-pay | Admitting: Cardiology

## 2023-11-21 ENCOUNTER — Ambulatory Visit: Payer: BC Managed Care – PPO | Attending: Cardiology | Admitting: Cardiology

## 2023-11-21 ENCOUNTER — Other Ambulatory Visit: Payer: Self-pay

## 2023-11-21 ENCOUNTER — Other Ambulatory Visit: Payer: BC Managed Care – PPO

## 2023-11-21 VITALS — BP 118/78 | HR 70 | Ht 68.0 in | Wt 224.2 lb

## 2023-11-21 DIAGNOSIS — R002 Palpitations: Secondary | ICD-10-CM

## 2023-11-21 DIAGNOSIS — R0609 Other forms of dyspnea: Secondary | ICD-10-CM

## 2023-11-21 DIAGNOSIS — I10 Essential (primary) hypertension: Secondary | ICD-10-CM

## 2023-11-21 DIAGNOSIS — E785 Hyperlipidemia, unspecified: Secondary | ICD-10-CM | POA: Diagnosis not present

## 2023-11-21 DIAGNOSIS — R0683 Snoring: Secondary | ICD-10-CM

## 2023-11-21 DIAGNOSIS — R072 Precordial pain: Secondary | ICD-10-CM

## 2023-11-21 MED ORDER — METOPROLOL TARTRATE 100 MG PO TABS: 100.0000 mg | ORAL_TABLET | Freq: Once | ORAL | 0 refills | Status: DC

## 2023-11-21 NOTE — Patient Instructions (Addendum)
Medication Instructions:  Your physician recommends that you continue on your current medications as directed. Please refer to the Current Medication list given to you today.  *If you need a refill on your cardiac medications before your next appointment, please call your pharmacy*   Lab Work: Your physician recommends that you return for lab work in:   Labs today: LPa  If you have labs (blood work) drawn today and your tests are completely normal, you will receive your results only by: MyChart Message (if you have MyChart) OR A paper copy in the mail If you have any lab test that is abnormal or we need to change your treatment, we will call you to review the results.   Testing/Procedures:   Your cardiac CT will be scheduled at one of the below locations:   [facility] [address] [place], [place] [address] [phone]  OR  [facility] [address] [place], [place] [address] [phone]  OR   [facility] [address] [place], [place] [address] [phone]  If scheduled at [facility], please arrive at the [facility] (Entrance C2) of [facility] 30 minutes prior to test start time. You can use the FREE valet parking offered at entrance C (encouraged to control the heart rate for the test)  Proceed to the [facility] Radiology Department (first floor) to check-in and test prep.  All radiology patients and guests should use entrance C2 at [facility], accessed from [facility], even though the hospital's physical address listed is [address]    If scheduled at [facility] or [facility], please arrive 15 mins early for check-in and test prep.  There is spacious parking and easy access to the radiology department from the [facility] Heart and Vascular entrance. Please  enter here and check-in with the desk attendant.   Please follow these instructions carefully (unless otherwise directed):  An IV will be required for this test and Nitroglycerin will be given.  Hold all erectile dysfunction medications at least 3 days (72 hrs) prior to test. (Ie viagra, cialis, sildenafil, tadalafil, etc)   On the Night Before the Test: Be sure to Drink plenty of water. Do not consume any caffeinated/decaffeinated beverages or chocolate 12 hours prior to your test. Do not take any antihistamines 12 hours prior to your test.  On the Day of the Test: Drink plenty of water until 1 hour prior to the test. Do not eat any food 1 hour prior to test. You may take your regular medications prior to the test.  Take metoprolol (Lopressor) two hours prior to test. FEMALES- please wear underwire-free bra if available, avoid dresses & tight clothing        After the Test: Drink plenty of water. After receiving IV contrast, you may experience a mild flushed feeling. This is normal. On occasion, you may experience a mild rash up to 24 hours after the test. This is not dangerous. If this occurs, you can take Benadryl 25 mg and increase your fluid intake. If you experience trouble breathing, this can be serious. If it is severe call 911 IMMEDIATELY. If it is mild, please call our office.  We will call to schedule your test 2-4 weeks out understanding that some insurance companies will need an authorization prior to the service being performed.   For more information and frequently asked questions,  please visit our website : [email or web address]  For non-scheduling related questions, please contact the cardiac imaging nurse navigator should you have any questions/concerns: Cardiac Imaging Nurse Navigators Direct Office Dial: [phone]   For scheduling needs, including cancellations and rescheduling, please call [place], [phone].    Follow-Up: At [facility], you and your health needs are our priority.  As part of our continuing mission to provide you with exceptional heart care, we have created designated Provider Care Teams.  These Care Teams include your primary Cardiologist (physician) and Advanced Practice Providers (APPs -  Physician Assistants and Nurse Practitioners) who all work together to provide you with the care you need, when you need it.  We recommend signing up for the patient portal called "MyChart".  Sign up information is provided on this After Visit Summary.  MyChart is used to connect with patients for Virtual Visits (Telemedicine).  Patients are able to view lab/test results, encounter notes, upcoming appointments, etc.  Non-urgent messages can be sent to your provider as well.   To learn more about what you can do with MyChart, go to [email or web address].    Your next appointment:   6 month(s)  Provider:   Wallis Bamberg, NP [facility])    Other Instructions None

## 2023-11-22 LAB — COMPREHENSIVE METABOLIC PANEL
ALT: 24 [IU]/L (ref 0–32)
AST: 22 [IU]/L (ref 0–40)
Albumin: 4.1 g/dL (ref 3.8–4.9)
Alkaline Phosphatase: 98 [IU]/L (ref 44–121)
BUN/Creatinine Ratio: 11 — ABNORMAL LOW (ref 12–28)
BUN: 9 mg/dL (ref 8–27)
Bilirubin Total: 0.5 mg/dL (ref 0.0–1.2)
CO2: 25 mmol/L (ref 20–29)
Calcium: 9.7 mg/dL (ref 8.7–10.3)
Chloride: 106 mmol/L (ref 96–106)
Creatinine, Ser: 0.82 mg/dL (ref 0.57–1.00)
Globulin, Total: 2.3 g/dL (ref 1.5–4.5)
Glucose: 90 mg/dL (ref 70–99)
Potassium: 4.3 mmol/L (ref 3.5–5.2)
Sodium: 139 mmol/L (ref 134–144)
Total Protein: 6.4 g/dL (ref 6.0–8.5)
eGFR: 82 mL/min/{1.73_m2} (ref >59)

## 2023-11-22 LAB — LIPOPROTEIN A (LPA): Lipoprotein (a): 25.2 nmol/L

## 2023-11-25 ENCOUNTER — Telehealth: Payer: Self-pay | Admitting: Emergency Medicine

## 2023-11-25 NOTE — Telephone Encounter (Signed)
LVM 11/25/2023. My chart message sent 11/25/2023

## 2023-11-25 NOTE — Telephone Encounter (Signed)
-----   Message from Flossie Dibble sent at 11/25/2023  7:58 AM EST ----- Your lipoprotein a was 25.2, this is a good result!

## 2023-11-26 ENCOUNTER — Encounter: Payer: Self-pay | Admitting: Emergency Medicine

## 2023-11-27 NOTE — Telephone Encounter (Signed)
Informed pt of LpA results. Pt verbalized understanding and had no further questions.

## 2023-12-31 ENCOUNTER — Encounter: Payer: Self-pay | Admitting: Student in an Organized Health Care Education/Training Program

## 2023-12-31 ENCOUNTER — Ambulatory Visit
Payer: BC Managed Care – PPO | Attending: Student in an Organized Health Care Education/Training Program | Admitting: Student in an Organized Health Care Education/Training Program

## 2023-12-31 VITALS — BP 119/82 | HR 67 | Temp 97.5°F | Resp 16 | Ht 68.0 in | Wt 215.0 lb

## 2023-12-31 DIAGNOSIS — M19011 Primary osteoarthritis, right shoulder: Secondary | ICD-10-CM | POA: Insufficient documentation

## 2023-12-31 DIAGNOSIS — G894 Chronic pain syndrome: Secondary | ICD-10-CM | POA: Insufficient documentation

## 2023-12-31 DIAGNOSIS — M47816 Spondylosis without myelopathy or radiculopathy, lumbar region: Secondary | ICD-10-CM | POA: Diagnosis present

## 2023-12-31 MED ORDER — HYDROCODONE-ACETAMINOPHEN 7.5-325 MG PO TABS: 1.0000 | ORAL_TABLET | Freq: Three times a day (TID) | ORAL | 0 refills | Status: AC | PRN

## 2023-12-31 MED ORDER — HYDROCODONE-ACETAMINOPHEN 7.5-325 MG PO TABS: 1.0000 | ORAL_TABLET | Freq: Three times a day (TID) | ORAL | 0 refills | Status: DC | PRN

## 2023-12-31 MED ORDER — DULOXETINE HCL 20 MG PO CPEP: ORAL_CAPSULE | ORAL | 0 refills | Status: DC

## 2023-12-31 NOTE — Progress Notes (Signed)
 PROVIDER NOTE: Information contained herein reflects review and annotations entered in association with encounter. Interpretation of such information and data should be left to medically-trained personnel. Information provided to patient can be located elsewhere in the medical record under Patient Instructions. Document created using STT-dictation technology, any transcriptional errors that may result from process are unintentional.    Patient: [name]  Service Category: E/M  Provider: Wallie Sherry, Huynh  DOB: July 04, 1961  DOS: 12/31/2023  Referring Provider: Moishe Suzen LABOR, NP  MRN: [id number]  Specialty: Interventional Pain Management  PCP: Vanessa Suzen LABOR, NP  Type: Established Patient  Setting: Ambulatory outpatient    Location: Office  Delivery: Face-to-face     HPI  [name], a 63 y.o. year old female, is here today because of her Lumbar spondylosis [M47.816]. [name] primary complain today is Joint Pain (Generalized joint pain ) and Shoulder Pain (Right )  Pertinent problems: [name] has Obesity; Arthritis of wrist; Chronic pain syndrome; Lumbar spondylosis; Bilateral primary osteoarthritis of knee; Bilateral hip pain; Chronic SI joint pain; Lumbar radiculopathy; Long-term current use of opiate analgesic; Right rotator cuff tear arthropathy; Chronic right shoulder pain; Fibromyalgia; and Cervical radicular pain (left) on their pertinent problem list. Pain Assessment: Severity of Chronic pain is reported as a 6 /10. Location: Other (Comment) (generalized joint pain) Other (Comment) (right shoulder)/denies. Onset: More than a month ago. Quality: Aching, Constant, Discomfort, Sore, Stabbing, Shooting. Timing: Constant. Modifying factor(s): medications. Vitals:  height is 5' 8 (1.727 m) and weight is 215 lb (97.5 kg). Her temporal temperature is 97.5 F (36.4 C) (abnormal). Her blood pressure is 119/82 and her pulse is 67. Her respiration is 16 and oxygen saturation is  100%.  BMI: Estimated body mass index is 32.69 kg/m as calculated from the following:   Height as of this encounter: 5' 8 (1.727 m).   Weight as of this encounter: 215 lb (97.5 kg). Last encounter: 09/26/2023. Last procedure: 05/29/2023.  Reason for encounter: medication management.   History of Present Illness   The patient, with a history of chronic pain, reports a significant increase in discomfort over the past month. They describe the pain as widespread, affecting the back, shoulder, knees, and ankles. The patient has been taking pain medication three times a day, including at night to manage sleep disturbances due to discomfort. They report waking up at 2 AM unable to find a comfortable position due to the pain.  The patient has previously seen an orthopedist and has considered a knee scope procedure. They have tried anti-inflammatories with limited success. The patient also reports lower back pain that occasionally radiates to the side and into the hip, but does not extend down the leg.  Patient is status post 2 positive diagnostic lumbar facet medial branch nerve blocks which were done 05/29/23, 04/03/2023, 10/19/2020 which provided >70% pain relief for 6 months.  The patient has been on gabapentin , which initially helped with sleep and pain management. However, they report that the effectiveness has decreased over time, leading to increased nighttime awakenings due to pain. The patient has also previously tried nerve blockers, which were effective but led to blood pressure issues.       Pharmacotherapy Assessment  Analgesic: hydrocodone  7.5 mg every 8 hours as needed, quantity 90/month    Monitoring: [place] PMP: PDMP reviewed during this encounter.       Pharmacotherapy: No side-effects or adverse reactions reported. Compliance: No problems identified. Effectiveness: Clinically acceptable.  Vanessa Delon BROCKS,  [name]  12/31/2023  3:45 PM  Sign when Signing Visit Pre procedure instructions  given.  Vanessa Chrissie MATSU, [name]  12/31/2023  3:13 PM  Sign when Signing Visit Nursing Pain Medication Assessment:  Safety precautions to be maintained throughout the outpatient stay will include: orient to surroundings, keep bed in low position, maintain call bell within reach at all times, provide assistance with transfer out of bed and ambulation.  Medication Inspection Compliance: Pill count conducted under aseptic conditions, in front of the patient. Neither the pills nor the bottle was removed from the patient's sight at any time. Once count was completed pills were immediately returned to the patient in their original bottle.  Medication: Hydrocodone /APAP Pill/Patch Count:  10 of 90 pills remain Pill/Patch Appearance: Markings consistent with prescribed medication Bottle Appearance: Standard pharmacy container. Clearly labeled. Filled Date: 65 / 17 / 2024 Last Medication intake:  Today    No results found for: CBDTHCR No results found for: D8THCCBX No results found for: D9THCCBX  UDS:  Summary  Date Value Ref Range Status  09/26/2023 Note  Final    Comment:    ==================================================================== [employer] Select 13 (MW) ==================================================================== Test                             Result       Flag       Units  Drug Present and Declared for Prescription Verification   Hydrocodone                     525          EXPECTED   ng/mg creat   Hydromorphone                  120          EXPECTED   ng/mg creat   Dihydrocodeine                 102          EXPECTED   ng/mg creat   Norhydrocodone                 1270         EXPECTED   ng/mg creat    Sources of hydrocodone  include scheduled prescription medications.    Hydromorphone, dihydrocodeine and norhydrocodone are expected    metabolites of hydrocodone . Hydromorphone and dihydrocodeine are    also available as scheduled prescription  medications.  ==================================================================== Test                      Result    Flag   Units      Ref Range   Creatinine              125              mg/dL      >=79 ==================================================================== Declared Medications:  The flagging and interpretation on this report are based on the  following declared medications.  Unexpected results may arise from  inaccuracies in the declared medications.   **Note: The testing scope of this panel includes these medications:   Hydrocodone  (Norco)   **Note: The testing scope of this panel does not include the  following reported medications:   Acetaminophen  (Norco)  Gabapentin   Melatonin  Multivitamin  Valacyclovir  (Valtrex ) ==================================================================== For clinical consultation, please call [phone]. ====================================================================       ROS  Constitutional: Denies any fever or chills Gastrointestinal: No reported hemesis, hematochezia, vomiting, or acute GI distress Musculoskeletal: low back pain and right shoulder pain Neurological: No reported episodes of acute onset apraxia, aphasia, dysarthria, agnosia, amnesia, paralysis, loss of coordination, or loss of consciousness  Medication Review  DULoxetine , HYDROcodone -acetaminophen , Melatonin, gabapentin , metoprolol  tartrate, multivitamin, and valACYclovir   History Review  Allergy: Ms. Marandola has no known allergies. Drug: Ms. Kinnamon  reports no history of drug use. Alcohol:  reports that she does not currently use alcohol. Tobacco:  reports that she quit smoking about 20 years ago. Her smoking use included cigarettes. She has never used smokeless tobacco. Social: Ms. Barritt  reports that she quit smoking about 20 years ago. Her smoking use included cigarettes. She has never used smokeless tobacco. She reports that she does not  currently use alcohol. She reports that she does not use drugs. Medical:  has a past medical history of Adult BMI 32.0-32.9 kg/sq m (10/28/2020), Arthritis, Arthritis of carpometacarpal [facility]) joint of both thumbs (03/19/2023), Arthritis of wrist (07/04/2011), Bilateral hip pain (10/13/2019), Bilateral primary osteoarthritis of knee (10/13/2019), Bilateral wrist pain (09/21/2021), Cervical radicular pain (left) (12/26/2021), Chronic pain syndrome (01/30/2019), Chronic right shoulder pain (09/27/2020), Chronic SI joint pain (10/13/2019), Degenerative disorder of bone, Dyslipidemia (09/04/2023), Elevated BP without diagnosis of hypertension (09/30/2020), Family history of diabetes mellitus (01/30/2019), Family history of heart disease (01/30/2019), Fever blister (08/21/2023), Fibromyalgia (09/21/2021), Genuine stress incontinence, female (2012), GERD (gastroesophageal reflux disease), History of squamous cell carcinoma of skin (10/28/2020), Hypertension (02/01/2021), Insomnia (01/13/2015), Long-term current use of opiate analgesic (01/02/2018), Lumbar radiculopathy (03/23/2020), Lumbar spondylosis (01/30/2019), Menopause (06/16/2018), Migraines, Obesity, Palpitations (08/21/2023), Prediabetes (02/01/2021), Primary osteoarthritis of right shoulder (09/27/2020), 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 Surgical: Ms. Bright  has a past surgical history that includes Laparoscopic hysterectomy (10/23/2010); Midurethral sling (05/02/2011); excision of vulvar lesion (05/02/2011); and Breast biopsy (Left, 10/11/2023). Family: family history includes Arthritis in her maternal grandmother and mother; Cancer in her father and paternal grandfather; Diabetes in her son; Heart attack in her maternal grandfather, mother, and paternal grandmother;  Hypertension in her mother.  Laboratory Chemistry Profile   Renal Lab Results  Component Value Date   BUN 9 11/21/2023   CREATININE 0.82 11/21/2023   BCR 11 (L) 11/21/2023   GFR 74.95 02/09/2021   GFRAA 92 06/16/2018   GFRNONAA >60 02/24/2021    Hepatic Lab Results  Component Value Date   AST 22 11/21/2023   ALT 24 11/21/2023   ALBUMIN 4.1 11/21/2023   ALKPHOS 98 11/21/2023    Electrolytes Lab Results  Component Value Date   NA 139 11/21/2023   K 4.3 11/21/2023   CL 106 11/21/2023   CALCIUM 9.7 11/21/2023   MG 1.8 01/30/2019    Bone Lab Results  Component Value Date   VD25OH 54.0 08/21/2023    Inflammation (CRP: Acute Phase) (ESR: Chronic Phase) No results found for: CRP, ESRSEDRATE, LATICACIDVEN       Note: Above Lab results reviewed.  Recent Imaging Review  DG Knee Complete 4 Views Left CLINICAL DATA:  Bilateral knee pain.  No known injury.  EXAM: RIGHT KNEE - COMPLETE 4+ VIEW; LEFT KNEE - COMPLETE 4+ VIEW  COMPARISON:  None Available.  FINDINGS: No acute fracture or dislocation.  No aggressive osseous lesion.  There are degenerative changes of bilateral knee joints in the form of mildly  reduced medial tibio-femoral compartment joint space, tibial spiking and osteophytosis. There is also reduced bilateral patellofemoral joint space.  No knee effusion or focal soft tissue swelling.  No radiopaque foreign bodies.  IMPRESSION: *There are degenerative changes of bilateral knee joints, predominantly involving the medial tibio-femoral and anterior patello-femoral compartments. *No acute osseous abnormality.  Electronically Signed   By: Ree Molt M.D.   On: 11/03/2023 10:40 DG Knee Complete 4 Views Right CLINICAL DATA:  Bilateral knee pain.  No known injury.  EXAM: RIGHT KNEE - COMPLETE 4+ VIEW; LEFT KNEE - COMPLETE 4+ VIEW  COMPARISON:  None Available.  FINDINGS: No acute fracture or dislocation.  No aggressive osseous  lesion.  There are degenerative changes of bilateral knee joints in the form of mildly reduced medial tibio-femoral compartment joint space, tibial spiking and osteophytosis. There is also reduced bilateral patellofemoral joint space.  No knee effusion or focal soft tissue swelling.  No radiopaque foreign bodies.  IMPRESSION: *There are degenerative changes of bilateral knee joints, predominantly involving the medial tibio-femoral and anterior patello-femoral compartments. *No acute osseous abnormality.  Electronically Signed   By: Ree Molt M.D.   On: 11/03/2023 10:40 Note: Reviewed        Physical Exam  General appearance: Well nourished, well developed, and well hydrated. In no apparent acute distress Mental status: Alert, oriented x 3 (person, place, & time)       Respiratory: No evidence of acute respiratory distress Eyes: PERLA Vitals: BP 119/82 (BP Location: Left Arm, Patient Position: Sitting, Cuff Size: Large)   Pulse 67   Temp (!) 97.5 F (36.4 C) (Temporal)   Resp 16   Ht 5' 8 (1.727 m)   Wt 215 lb (97.5 kg)   SpO2 100%   BMI 32.69 kg/m  BMI: Estimated body mass index is 32.69 kg/m as calculated from the following:   Height as of this encounter: 5' 8 (1.727 m).   Weight as of this encounter: 215 lb (97.5 kg). Ideal: Ideal body weight: 63.9 kg (140 lb 14 oz) Adjusted ideal body weight: 77.3 kg (170 lb 8.4 oz)  Right shoulder pain and limited ROM Low back pain ,worse with facet loading 5 out of 5 strength bilateral lower extremity: Plantar flexion, dorsiflexion, knee flexion, knee extension.   Assessment   Diagnosis Status  1. Lumbar spondylosis   2. Lumbar facet arthropathy   3. Primary osteoarthritis of right shoulder   4. Chronic pain syndrome    Having a Flare-up Having a Flare-up Having a Flare-up   Updated Problems: No problems updated.  Plan of Care  Plan for Lumbar RFA, s/p 3+ diagnostic lumbar medial branch nerve blocks to  address increased low back pain related to lumbar spondylosis Increased right shoulder pain related to OA, s/p right shoulder injections in the past (previously done in 2022). Discussed repeating right shoulder steroid injection Refill of Hydrocodone  as below Start Cymbalta  as below     [name] has a current medication list which includes the following long-term medication(s): duloxetine , gabapentin , and metoprolol  tartrate.  Pharmacotherapy (Medications Ordered): Meds ordered this encounter  Medications   HYDROcodone -acetaminophen  (NORCO) 7.5-325 MG tablet    Sig: Take 1 tablet by mouth every 8 (eight) hours as needed for moderate pain (pain score 4-6).    Dispense:  90 tablet    Refill:  0   HYDROcodone -acetaminophen  (NORCO) 7.5-325 MG tablet    Sig: Take 1 tablet by mouth every 8 (eight) hours as needed for  moderate pain (pain score 4-6).    Dispense:  90 tablet    Refill:  0   HYDROcodone -acetaminophen  (NORCO) 7.5-325 MG tablet    Sig: Take 1 tablet by mouth every 8 (eight) hours as needed for moderate pain (pain score 4-6).    Dispense:  90 tablet    Refill:  0   DULoxetine  (CYMBALTA ) 20 MG capsule    Sig: Take 1 capsule (20 mg total) by mouth daily for 30 days, THEN 2 capsules (40 mg total) daily.    Dispense:  90 capsule    Refill:  0   Orders:  Orders Placed This Encounter  Procedures   SHOULDER INJECTION    Standing Status:   Future    Expected Date:   01/31/2024    Expiration Date:   03/30/2024    Scheduling Instructions:     Procedure: Intra-articular shoulder (Glenohumeral) joint injection     Side: RIGHT     Level: Glenohumeral joint               Sedation: Patient's choice.     Timeframe: As permitted by the schedule    Where will this procedure be performed?:   [facility]   Radiofrequency,Lumbar    Standing Status:   Future    Expected Date:   01/15/2024    Expiration Date:   03/30/2024    Scheduling Instructions:     Side(s):  Bilateral     Level(s): L3, L4, L5, Medial Branch Nerve(s)     Sedation: With Sedation     Scheduling Timeframe: As soon as pre-approved    Where will this procedure be performed?:   [facility]   Follow-up plan:   Return in about 15 days (around 01/15/2024) for B/L L3, 4, 5 RFA, ECT.      s/p IA hyalgan #1 on 08/19/2019, #2 09/23/2019 not very effective unfortunately.  Lumbar MRI shows facet disease at L3, L4, L5 bilaterally.  Bilateral L3, L4, L5 medial branch nerve block #1; right glenohumeral shoulder joint injection (anterior approach) 10/19/2020, 03/13/2021              Recent Visits No visits were found meeting these conditions. Showing recent visits within past 90 days and meeting all other requirements Today's Visits Date Type Provider Dept  12/31/23 Office Visit Marcelino Nurse, Huynh [facility]  Showing today's visits and meeting all other requirements Future Appointments Date Type Provider Dept  03/26/24 Appointment Marcelino Nurse, Huynh [facility]  Showing future appointments within next 90 days and meeting all other requirements  I discussed the assessment and treatment plan with the patient. The patient was provided an opportunity to ask questions and all were answered. The patient agreed with the plan and demonstrated an understanding of the instructions.  Patient advised to call back or seek an in-person evaluation if the symptoms or condition worsens.  Duration of encounter: .  Total time on encounter, as per AMA guidelines included both the face-to-face and non-face-to-face time personally spent by the physician and/or other qualified health care professional(s) on the day of the encounter (includes time in activities that require the physician or other qualified health care professional and does not include time in activities normally performed by clinical staff). Physician's time may include the following activities when  performed: Preparing to see the patient (e.g., pre-charting review of records, searching for previously ordered imaging, lab work, and nerve conduction tests) Review of prior analgesic pharmacotherapies. Reviewing PMP  Interpreting ordered tests (e.g., lab work, imaging, nerve conduction tests) Performing post-procedure evaluations, including interpretation of diagnostic procedures Obtaining and/or reviewing separately obtained history Performing a medically appropriate examination and/or evaluation Counseling and educating the patient/family/caregiver Ordering medications, tests, or procedures Referring and communicating with other health care professionals (when not separately reported) Documenting clinical information in the electronic or other health record Independently interpreting results (not separately reported) and communicating results to the patient/ family/caregiver Care coordination (not separately reported)  Note by: [name] Date: 12/31/2023; Time: 4:04 PM

## 2023-12-31 NOTE — Progress Notes (Signed)
 Nursing Pain Medication Assessment:  Safety precautions to be maintained throughout the outpatient stay will include: orient to surroundings, keep bed in low position, maintain call bell within reach at all times, provide assistance with transfer out of bed and ambulation.  Medication Inspection Compliance: Pill count conducted under aseptic conditions, in front of the patient. Neither the pills nor the bottle was removed from the patient's sight at any time. Once count was completed pills were immediately returned to the patient in their original bottle.  Medication: Hydrocodone /APAP Pill/Patch Count:  10 of 90 pills remain Pill/Patch Appearance: Markings consistent with prescribed medication Bottle Appearance: Standard pharmacy container. Clearly labeled. Filled Date: 8 / 17 / 2024 Last Medication intake:  Today

## 2023-12-31 NOTE — Patient Instructions (Signed)
 ______________________________________________________________________    Post-Radiofrequency (RF) Discharge Instructions  You have just completed a Radiofrequency Neurotomy.  The following instructions will provide you with information and guidelines for self-care upon discharge.  If at any time you have questions or concerns please call your physician. DO NOT DRIVE YOURSELF!!  Instructions: Apply ice: Fill a plastic sandwich bag with crushed ice. Cover it with a small towel and apply to injection site. Apply for 15 minutes then remove x 15 minutes. Repeat sequence on day of procedure, until you go to bed. The purpose is to minimize swelling and discomfort after procedure. Apply heat: Apply heat to procedure site starting the day following the procedure. The purpose is to treat any soreness and discomfort from the procedure. Food intake: No eating limitations, unless stipulated above.  Nevertheless, if you have had sedation, you may experience some nausea.  In this case, it may be wise to wait at least two hours prior to resuming regular diet. Physical activities: Keep activities to a minimum for the first 8 hours after the procedure. For the first 24 hours after the procedure, do not drive a motor vehicle,  Operate heavy machinery, power tools, or handle any weapons.  Consider walking with the use of an assistive device or accompanied by an adult for the first 24 hours.  Do not drink alcoholic beverages including beer.  Do not make any important decisions or sign any legal documents. Go home and rest today.  Resume activities tomorrow, as tolerated.  Use caution in moving about as you may experience mild leg weakness.  Use caution in cooking, use of household electrical appliances and climbing steps. Driving: If you have received any sedation, you are not allowed to drive for 24 hours after your procedure. Blood thinner: Restart your blood thinner 6 hours after your procedure. (Only for those taking  blood thinners) Insulin: As soon as you can eat, you may resume your normal dosing schedule. (Only for those taking insulin) Medications: May resume pre-procedure medications.  Do not take any drugs, other than what has been prescribed to you. Infection prevention: Keep procedure site clean and dry. Post-procedure Pain Diary: Extremely important that this be done correctly and accurately. Recorded information will be used to determine the next step in treatment. Pain evaluated is that of treated area only. Do not include pain from an untreated area. Complete every hour, on the hour, for the initial 8 hours. Set an alarm to help you do this part accurately. Do not go to sleep and have it completed later. It will not be accurate. Follow-up appointment: Keep your follow-up appointment after the procedure. Usually 2-6 weeks after radiofrequency. Bring you pain diary. The information collected will be essential for your long-term care.   Expect: From numbing medicine (AKA: Local Anesthetics): Numbness or decrease in pain. Onset: Full effect within 15 minutes of injected. Duration: It will depend on the type of local anesthetic used. On the average, 1 to 8 hours.  From steroids (when added): Decrease in swelling or inflammation. Once inflammaRadiofrequency Ablation Radiofrequency ablation is a procedure that is performed to relieve pain. The procedure is often used for back, neck, or arm pain. Radiofrequency ablation involves the use of a machine that creates radio waves to make heat. During the procedure, the heat is applied to the nerve that carries the pain signal. The heat damages the nerve and interferes with the pain signal. Pain relief usually starts about 2 weeks after the procedure and lasts for 6 months  to 1 year. Tell a health care provider about: Any allergies you have. All medicines you are taking, including vitamins, herbs, eye drops, creams, and over-the-counter medicines. Any problems  you or family members have had with anesthetic medicines. Any bleeding problems you have. Any surgeries you have had. Any medical conditions you have. Whether you are pregnant or may be pregnant. What are the risks? Generally, this is a safe procedure. However, problems may occur, including: Pain or soreness at the injection site. Allergic reaction to medicines given during the procedure. Bleeding. Infection at the injection site. Damage to nerves or blood vessels. What happens before the procedure? When to stop eating and drinking Follow instructions from your health care provider about what you may eat and drink before your procedure. These may include: 8 hours before the procedure Stop eating most foods. Do not eat meat, fried foods, or fatty foods. Eat only light foods, such as toast or crackers. All liquids are okay except energy drinks and alcohol. 6 hours before the procedure Stop eating. Drink only clear liquids, such as water, clear fruit juice, black coffee, plain tea, and sports drinks. Do not drink energy drinks or alcohol. 2 hours before the procedure Stop drinking all liquids. You may be allowed to take medicine with small sips of water. If you do not follow your health care provider's instructions, your procedure may be delayed or canceled. Medicines Ask your health care provider about: Changing or stopping your regular medicines. This is especially important if you are taking diabetes medicines or blood thinners. Taking medicines such as aspirin and ibuprofen. These medicines can thin your blood. Do not take these medicines unless your health care provider tells you to take them. Taking over-the-counter medicines, vitamins, herbs, and supplements. General instructions Ask your health care provider what steps will be taken to help prevent infection. These steps may include: Removing hair at the procedure site. Washing skin with a germ-killing soap. Taking antibiotic  medicine. If you will be going home right after the procedure, plan to have a responsible adult: Take you home from the hospital or clinic. You will not be allowed to drive. Care for you for the time you are told. What happens during the procedure?  You will be awake during the procedure. You will need to be able to talk with the health care provider during the procedure. An IV will be inserted into one of your veins. You will be given one or more of the following: A medicine to help you relax (sedative). A medicine to numb the area (local anesthetic). Your health care provider will insert a radiofrequency needle into the area to be treated. This is done with the help of fluoroscopy. A wire that carries the radio waves (electrode) will be put through the radiofrequency needle. An electrical pulse will be sent through the electrode to verify the correct nerve that is causing your pain. You will feel a tingling sensation, and you may have muscle twitching. The tissue around the needle tip will be heated by an electric current that comes from the radiofrequency machine. This will numb the nerves. The needle will be removed. A bandage (dressing) will be put on the insertion area. The procedure may vary among health care providers and hospitals. What happens after the procedure? Your blood pressure, heart rate, breathing rate, and blood oxygen level will be monitored until you leave the hospital or clinic. Return to your normal activities as told by your health care provider. Ask your health  care provider what activities are safe for you. If you were given a sedative during the procedure, it can affect you for several hours. Do not drive or operate machinery until your health care provider says that it is safe. Summary Radiofrequency ablation is a procedure that is performed to relieve pain. The procedure is often used for back, neck, or arm pain. Radiofrequency ablation involves the use of a  machine that creates radio waves to make heat. Plan to have a responsible adult take you home from the hospital or clinic. Do not drive or operate machinery until your health care provider says that it is safe. Return to your normal activities as told by your health care provider. Ask your health care provider what activities are safe for you. This information is not intended to replace advice given to you by your health care provider. Make sure you discuss any questions you have with your health care provider. Document Revised: 05/23/2021 Document Reviewed: 05/23/2021 Elsevier Patient Education  2024 [employer]. tion is improved, relief of the pain will follow. Onset of benefits: Depends on the amount of swelling present. The more swelling, the longer it will take for the benefits to be seen. In some cases, up to 10 days. Duration: Steroids will stay in the system x 2 weeks. Duration of benefits will depend on multiple posibilities including persistent irritating factors. From procedure: Some discomfort is to be expected once the numbing medicine wears off. In the case of radiofrequency procedures, this may last as long as 6 weeks. Additional post-procedure pain medication is provided for this. Discomfort is minimized if ice and heat are applied as instructed.  Call if: You experience numbness and weakness that gets worse with time, as opposed to wearing off. He experience any unusual bleeding, difficulty breathing, or loss of the ability to control your bowel and bladder. (This applies to Spinal procedures only) You experience any redness, swelling, heat, red streaks, elevated temperature, fever, or any other signs of a possible infection.  Emergency Numbers: Durning business hours (Monday - Thursday, 8:00 AM - 4:00 PM) (Friday, 9:00 AM - 12:00 Noon): [phone] After hours: [phone] ______________________________________________________________________

## 2023-12-31 NOTE — Progress Notes (Signed)
 Pre procedure instructions given

## 2024-01-06 ENCOUNTER — Telehealth: Payer: Self-pay

## 2024-01-06 NOTE — Telephone Encounter (Signed)
Insurance Treatment Denial Note  Date order was entered:  Order entered by: Edward Jolly, MD Requested treatment: RFA Reason for denial:  see below Recommended for approval:  see below   They do not want to approve the RFA because she did not get 80% or more relief from the diagnostics. She got 75%.

## 2024-01-07 NOTE — Telephone Encounter (Signed)
I will call them as soon as soon I get back to my desk. I am sitting at the check in today again, so hopefully tomorrow I can get one set up.

## 2024-01-27 ENCOUNTER — Encounter: Payer: Self-pay | Admitting: Student in an Organized Health Care Education/Training Program

## 2024-01-27 DIAGNOSIS — M47816 Spondylosis without myelopathy or radiculopathy, lumbar region: Secondary | ICD-10-CM

## 2024-01-27 MED ORDER — PREDNISONE 20 MG PO TABS: ORAL_TABLET | ORAL | 0 refills | Status: AC

## 2024-01-28 ENCOUNTER — Ambulatory Visit: Payer: BC Managed Care – PPO | Admitting: Student in an Organized Health Care Education/Training Program

## 2024-03-02 ENCOUNTER — Telehealth: Payer: Self-pay | Admitting: Student in an Organized Health Care Education/Training Program

## 2024-03-02 ENCOUNTER — Other Ambulatory Visit: Payer: Self-pay | Admitting: Student in an Organized Health Care Education/Training Program

## 2024-03-02 DIAGNOSIS — M5416 Radiculopathy, lumbar region: Secondary | ICD-10-CM

## 2024-03-02 DIAGNOSIS — M797 Fibromyalgia: Secondary | ICD-10-CM

## 2024-03-02 DIAGNOSIS — M47816 Spondylosis without myelopathy or radiculopathy, lumbar region: Secondary | ICD-10-CM

## 2024-03-02 DIAGNOSIS — G894 Chronic pain syndrome: Secondary | ICD-10-CM

## 2024-03-02 NOTE — Telephone Encounter (Signed)
 Needs refill on gabapentin

## 2024-03-03 ENCOUNTER — Other Ambulatory Visit: Payer: Self-pay

## 2024-03-03 DIAGNOSIS — M797 Fibromyalgia: Secondary | ICD-10-CM

## 2024-03-03 DIAGNOSIS — G894 Chronic pain syndrome: Secondary | ICD-10-CM

## 2024-03-03 DIAGNOSIS — M47816 Spondylosis without myelopathy or radiculopathy, lumbar region: Secondary | ICD-10-CM

## 2024-03-03 DIAGNOSIS — M5416 Radiculopathy, lumbar region: Secondary | ICD-10-CM

## 2024-03-03 MED ORDER — GABAPENTIN 600 MG PO TABS: 600.0000 mg | ORAL_TABLET | Freq: Every day | ORAL | 5 refills | Status: DC

## 2024-03-03 NOTE — Telephone Encounter (Signed)
Rx request sent to Dr. Lateef 

## 2024-03-06 ENCOUNTER — Telehealth (HOSPITAL_COMMUNITY): Payer: Self-pay | Admitting: *Deleted

## 2024-03-06 NOTE — Telephone Encounter (Signed)
 Attempted to call patient regarding upcoming cardiac CT appointment. Left message on voicemail with name and callback number  Larey Brick RN Navigator Cardiac Imaging [facility] [phone] Office [phone] Cell

## 2024-03-06 NOTE — Telephone Encounter (Signed)
 Reaching out to patient to offer assistance regarding upcoming cardiac imaging study; pt verbalizes understanding of appt date/time, parking situation and where to check in, pre-test NPO status and medications ordered, and verified current allergies; name and call back number provided for further questions should they arise  Larey Brick RN Navigator Cardiac Imaging [facility] [phone] office [phone] cell  Patient to hold metoprolol tartrate if HR is less than 65 bpm two hours prior to her cardiac CT scan. She is aware to arrive at 10:30 AM.

## 2024-03-09 ENCOUNTER — Ambulatory Visit (HOSPITAL_COMMUNITY)
Admission: RE | Admit: 2024-03-09 | Discharge: 2024-03-09 | Disposition: A | Discharge status: Home / Self Care | Payer: BC Managed Care – PPO | Source: Ambulatory Visit | Attending: Cardiology | Admitting: Cardiology

## 2024-03-09 DIAGNOSIS — R072 Precordial pain: Secondary | ICD-10-CM | POA: Insufficient documentation

## 2024-03-09 MED ORDER — IOHEXOL 350 MG/ML SOLN
95.0000 mL | Freq: Once | INTRAVENOUS | Status: AC | PRN
  Administered 2024-03-09: 95 mL via INTRAVENOUS

## 2024-03-09 MED ORDER — METOPROLOL TARTRATE 5 MG/5ML IV SOLN
INTRAVENOUS | Status: AC
  Filled 2024-03-09: qty 10

## 2024-03-09 MED ORDER — NITROGLYCERIN 0.4 MG SL SUBL
SUBLINGUAL_TABLET | SUBLINGUAL | Status: AC
  Filled 2024-03-09: qty 2

## 2024-03-09 MED ORDER — METOPROLOL TARTRATE 5 MG/5ML IV SOLN: 10.0000 mg | INTRAVENOUS | Status: DC | PRN

## 2024-03-09 MED ORDER — NITROGLYCERIN 0.4 MG SL SUBL
0.8000 mg | SUBLINGUAL_TABLET | Freq: Once | SUBLINGUAL | Status: AC
Start: 2024-03-09 — End: 2024-03-09
  Administered 2024-03-09: 0.8 mg via SUBLINGUAL

## 2024-03-09 NOTE — Progress Notes (Signed)
Pt verbalized understanding of discharge instruction; opportunity for questions provided ?

## 2024-03-26 ENCOUNTER — Encounter: Payer: Self-pay | Admitting: Student in an Organized Health Care Education/Training Program

## 2024-03-26 ENCOUNTER — Ambulatory Visit
Payer: BC Managed Care – PPO | Attending: Student in an Organized Health Care Education/Training Program | Admitting: Student in an Organized Health Care Education/Training Program

## 2024-03-26 VITALS — BP 149/101 | HR 93 | Temp 97.0°F | Resp 16 | Ht 68.0 in | Wt 200.0 lb

## 2024-03-26 DIAGNOSIS — M170 Bilateral primary osteoarthritis of knee: Secondary | ICD-10-CM | POA: Insufficient documentation

## 2024-03-26 DIAGNOSIS — G8929 Other chronic pain: Secondary | ICD-10-CM | POA: Insufficient documentation

## 2024-03-26 DIAGNOSIS — M19011 Primary osteoarthritis, right shoulder: Secondary | ICD-10-CM | POA: Diagnosis present

## 2024-03-26 DIAGNOSIS — G894 Chronic pain syndrome: Secondary | ICD-10-CM | POA: Insufficient documentation

## 2024-03-26 DIAGNOSIS — M47816 Spondylosis without myelopathy or radiculopathy, lumbar region: Secondary | ICD-10-CM | POA: Diagnosis present

## 2024-03-26 DIAGNOSIS — M25511 Pain in right shoulder: Secondary | ICD-10-CM | POA: Diagnosis present

## 2024-03-26 MED ORDER — HYDROCODONE-ACETAMINOPHEN 7.5-325 MG PO TABS: 1.0000 | ORAL_TABLET | Freq: Three times a day (TID) | ORAL | 0 refills | Status: AC | PRN

## 2024-03-26 MED ORDER — DULOXETINE HCL 20 MG PO CPEP: 20.0000 mg | ORAL_CAPSULE | Freq: Every day | ORAL | 5 refills | Status: DC

## 2024-03-26 MED ORDER — HYDROCODONE-ACETAMINOPHEN 7.5-325 MG PO TABS: 1.0000 | ORAL_TABLET | Freq: Three times a day (TID) | ORAL | 0 refills | Status: DC | PRN

## 2024-03-26 NOTE — Progress Notes (Signed)
 Nursing Pain Medication Assessment:  Safety precautions to be maintained throughout the outpatient stay will include: orient to surroundings, keep bed in low position, maintain call bell within reach at all times, provide assistance with transfer out of bed and ambulation.  Medication Inspection Compliance: Pill count conducted under aseptic conditions, in front of the patient. Neither the pills nor the bottle was removed from the patient's sight at any time. Once count was completed pills were immediately returned to the patient in their original bottle.  Medication: Hydrocodone/APAP Pill/Patch Count:  27 of 90 pills remain Pill/Patch Appearance: Markings consistent with prescribed medication Bottle Appearance: Standard pharmacy container. Clearly labeled. Filled Date: 3 / 21 / 2025 Last Medication intake:  Today

## 2024-03-26 NOTE — Progress Notes (Signed)
 PROVIDER NOTE: Interpretation of information contained herein should be left to medically-trained personnel. Specific patient instructions are provided elsewhere under "Patient Instructions" section of medical record. This document was created in part using AI and STT-dictation technology, any transcriptional errors that may result from this process are unintentional.  Patient: [name]  Service: E/M   PCP: Theadore Nan, NP  DOB: 22-Jun-1961  DOS: 03/26/2024  Provider: Edward Jolly, MD  MRN: [id number]  Delivery: Face-to-face  Specialty: Interventional Pain Management  Type: Established Patient  Setting: Ambulatory outpatient facility  Specialty designation: 09  Referring Prov.: Theadore Nan, NP  Location: Outpatient office facility       HPI  [name], a 63 y.o. year old female, is here today because of her Primary osteoarthritis of right shoulder [M19.011]. Ms. Meinhart primary complain today is Joint Pain and Shoulder Pain (RIGHT)  Pertinent problems: Ms. Golladay has Obesity; Arthritis of wrist; Chronic pain syndrome; Lumbar spondylosis; Bilateral primary osteoarthritis of knee; Bilateral hip pain; Chronic SI joint pain; Lumbar radiculopathy; Long-term current use of opiate analgesic; Right rotator cuff tear arthropathy; Chronic right shoulder pain; Fibromyalgia; and Cervical radicular pain (left) on their pertinent problem list. Pain Assessment: Severity of Chronic pain is reported as a 7 /10. Location: Other (Comment) (JOINT PAIN - BACK IS WORSE)  / . Onset: More than a month ago. Quality: Aching, Sore, Constant. Timing: Constant. Modifying factor(s): MEDS HELP. Vitals:  height is 5\' 8"  (1.727 m) and weight is 200 lb (90.7 kg). Her temperature is 97 F (36.1 C) (abnormal). Her blood pressure is 149/101 (abnormal) and her pulse is 93. Her respiration is 16 and oxygen saturation is 99%.  BMI: Estimated body mass index is 30.41 kg/m as calculated from the following:    Height as of this encounter: 5\' 8"  (1.727 m).   Weight as of this encounter: 200 lb (90.7 kg). Last encounter: 12/31/2023. Last procedure: 05/29/2023.  Reason for encounter:   Discussed the use of AI scribe software for clinical note transcription with the patient, who gave verbal consent to proceed.  History of Present Illness   [name] is a 63 year old female who presents with chronic shoulder pain.  She experiences persistent pain in her shoulder, primarily in the anterior region. The last treatment, which included a shoulder procedure and a neck injection, provided significant relief, but the pain has since returned and is bothersome. The last shoulder treatment was performed on March 13, 2021, indicating it has been over a year since her last intervention.  She has issues with her knees, having previously been referred to an orthopedic specialist who suggested a scope procedure due to arthritis and bone spurs. She did not pursue this due to lack of PTO but is now considering it again as she has accrued more PTO. She experiences difficulty with mobility, particularly in getting up from the floor, and notes that her legs and ankles swell if she does not move around during the day.  She is currently taking gabapentin, Cymbalta, and hydrocodone for pain management. She has been on a stable dose of 20 mg of Cymbalta and has not increased it due to concerns about side effects. Her gabapentin was recently renewed, and she has four refills remaining. Her next refill for oxycodone is scheduled for April 02, 2024.  She has implemented lifestyle changes to manage her symptoms, including using an ergonomic seat pad and taking regular breaks to walk and stretch, which she finds  beneficial in reducing swelling in her legs and ankles.       Pharmacotherapy Assessment  Analgesic: hydrocodone 7.5 mg every 8 hours as needed, quantity 90/month    Monitoring: [place] PMP: PDMP reviewed during this  encounter.       Pharmacotherapy: No side-effects or adverse reactions reported. Compliance: No problems identified. Effectiveness: Clinically acceptable.  Nonah Mattes, RN  03/26/2024  3:13 PM  Sign when Signing Visit Nursing Pain Medication Assessment:  Safety precautions to be maintained throughout the outpatient stay will include: orient to surroundings, keep bed in low position, maintain call bell within reach at all times, provide assistance with transfer out of bed and ambulation.  Medication Inspection Compliance: Pill count conducted under aseptic conditions, in front of the patient. Neither the pills nor the bottle was removed from the patient's sight at any time. Once count was completed pills were immediately returned to the patient in their original bottle.  Medication: Hydrocodone/APAP Pill/Patch Count:  27 of 90 pills remain Pill/Patch Appearance: Markings consistent with prescribed medication Bottle Appearance: Standard pharmacy container. Clearly labeled. Filled Date: 3 / 106 / 2025 Last Medication intake:  Today    No results found for: "CBDTHCR" No results found for: "D8THCCBX" No results found for: "D9THCCBX"  UDS:  Summary  Date Value Ref Range Status  09/26/2023 Note  Final    Comment:    ==================================================================== [employer] [facility] 13 (MW) ==================================================================== Test                             Result       Flag       Units  Drug Present and Declared for Prescription Verification   Hydrocodone                    525          EXPECTED   ng/mg creat   Hydromorphone                  120          EXPECTED   ng/mg creat   Dihydrocodeine                 102          EXPECTED   ng/mg creat   Norhydrocodone                 1270         EXPECTED   ng/mg creat    Sources of hydrocodone include scheduled prescription medications.    Hydromorphone, dihydrocodeine and norhydrocodone  are expected    metabolites of hydrocodone. Hydromorphone and dihydrocodeine are    also available as scheduled prescription medications.  ==================================================================== Test                      Result    Flag   Units      Ref Range   Creatinine              125              mg/dL      >=16 ==================================================================== Declared Medications:  The flagging and interpretation on this report are based on the  following declared medications.  Unexpected results may arise from  inaccuracies in the declared medications.   **Note: The testing scope of this panel includes these medications:   Hydrocodone (Norco)   **Note:  The testing scope of this panel does not include the  following reported medications:   Acetaminophen (Norco)  Gabapentin  Melatonin  Multivitamin  Valacyclovir (Valtrex) ==================================================================== For clinical consultation, please call [phone]. ====================================================================       ROS  Constitutional: Denies any fever or chills Gastrointestinal: No reported hemesis, hematochezia, vomiting, or acute GI distress Musculoskeletal:  right shoulder pain, low back pain Neurological: No reported episodes of acute onset apraxia, aphasia, dysarthria, agnosia, amnesia, paralysis, loss of coordination, or loss of consciousness  Medication Review  DULoxetine, HYDROcodone-acetaminophen, Melatonin, gabapentin, multivitamin, and valACYclovir  History Review  Allergy: Ms. Gramajo has no known allergies. Drug: Ms. Coye  reports no history of drug use. Alcohol:  reports that she does not currently use alcohol. Tobacco:  reports that she quit smoking about 21 years ago. Her smoking use included cigarettes. She has never used smokeless tobacco. Social: Ms. Barbuto  reports that she quit smoking about 21 years ago. Her  smoking use included cigarettes. She has never used smokeless tobacco. She reports that she does not currently use alcohol. She reports that she does not use drugs. Medical:  has a past medical history of Adult BMI 32.0-32.9 kg/sq m (10/28/2020), Arthritis, Arthritis of carpometacarpal [facility]) joint of both thumbs (03/19/2023), Arthritis of wrist (07/04/2011), Bilateral hip pain (10/13/2019), Bilateral primary osteoarthritis of knee (10/13/2019), Bilateral wrist pain (09/21/2021), Cervical radicular pain (left) (12/26/2021), Chronic pain syndrome (01/30/2019), Chronic right shoulder pain (09/27/2020), Chronic SI joint pain (10/13/2019), Degenerative disorder of bone, Dyslipidemia (09/04/2023), Elevated BP without diagnosis of hypertension (09/30/2020), Family history of diabetes mellitus (01/30/2019), Family history of heart disease (01/30/2019), Fever blister (08/21/2023), Fibromyalgia (09/21/2021), Genuine stress incontinence, female (2012), GERD (gastroesophageal reflux disease), History of squamous cell carcinoma of skin (10/28/2020), Hypertension (02/01/2021), Insomnia (01/13/2015), Long-term current use of opiate analgesic (01/02/2018), Lumbar radiculopathy (03/23/2020), Lumbar spondylosis (01/30/2019), Menopause (06/16/2018), Migraines, Obesity, Palpitations (08/21/2023), Prediabetes (02/01/2021), Primary osteoarthritis of right shoulder (09/27/2020), 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 Surgical: Ms. Ruacho  has a past surgical history that includes Laparoscopic hysterectomy (10/23/2010); Midurethral sling (05/02/2011); excision of vulvar lesion (05/02/2011); and Breast biopsy (Left, 10/11/2023). Family: family history includes Arthritis in her maternal grandmother and mother; Cancer in her father and paternal grandfather;  Diabetes in her son; Heart attack in her maternal grandfather, mother, and paternal grandmother; Hypertension in her mother.  Laboratory Chemistry Profile   Renal Lab Results  Component Value Date   BUN 9 11/21/2023   CREATININE 0.82 11/21/2023   BCR 11 (L) 11/21/2023   GFR 74.95 02/09/2021   GFRAA 92 06/16/2018   GFRNONAA >60 02/24/2021    Hepatic Lab Results  Component Value Date   AST 22 11/21/2023   ALT 24 11/21/2023   ALBUMIN 4.1 11/21/2023   ALKPHOS 98 11/21/2023    Electrolytes Lab Results  Component Value Date   NA 139 11/21/2023   K 4.3 11/21/2023   CL 106 11/21/2023   CALCIUM 9.7 11/21/2023   MG 1.8 01/30/2019    Bone Lab Results  Component Value Date   VD25OH 54.0 08/21/2023    Inflammation (CRP: Acute Phase) (ESR: Chronic Phase) No results found for: "CRP", "ESRSEDRATE", "LATICACIDVEN"       Note: Above Lab results reviewed.  Recent Imaging Review  CT CORONARY MORPH W/CTA COR W/SCORE W/CA W/CM &/OR WO/CM Addendum: ADDENDUM REPORT: 03/20/2024 21:31   EXAM:  OVER-READ INTERPRETATION  CT CHEST   The following report is an over-read performed by radiologist Dr.  Alcide Clever of [facility] on 03/20/2024. This over-read  does not include interpretation of cardiac or coronary anatomy or  pathology. The coronary calcium score/coronary CTA interpretation by  the cardiologist is attached.   COMPARISON:  None.   FINDINGS:  Cardiovascular: There are no significant extracardiac vascular  findings.   Mediastinum/Nodes: There are no enlarged lymph nodes within the  visualized mediastinum.   Lungs/Pleura: There is no pleural effusion. The visualized lungs  appear clear.   Upper abdomen: No significant findings in the visualized upper  abdomen.   Musculoskeletal/Chest wall: No chest wall mass or suspicious osseous  findings within the visualized chest.   IMPRESSION:  No significant extracardiac findings within the visualized chest.    Electronically Signed    By: Alcide Clever M.D.    On: 03/20/2024 21:31 Narrative: HISTORY: 63 yo female with chest pain, dyspnea, nonspecific  EXAM: Cardiac/Coronary CTA  TECHNIQUE: The patient was scanned on a [employer].  PROTOCOL: A 120 kV prospective scan was triggered in the descending thoracic aorta at 111 HU's. Axial non-contrast 3 mm slices were carried out through the heart. The data set was analyzed on a dedicated work station and scored using the Agatson method. Gantry rotation speed was 250 msecs and collimation was .6 mm. Beta blockade and 0.8 mg of sl NTG was given. The 3D data set was reconstructed in 5% intervals of the 35-75 % of the R-R cycle. Diastolic phases were analyzed on a dedicated work station using MPR, MIP and VRT modes. The patient received 95mL OMNIPAQUE IOHEXOL 350 MG/ML SOLN contrast.  FINDINGS: Quality: Very good, HR 69  Coronary calcium score: The patient's coronary artery calcium score is 0.  Coronary arteries: Normal coronary origins.  Right dominance.  Right Coronary Artery: Dominant. No disease. Normal R-PLB and R-PDA branches.  Left Main Coronary Artery: Normal. Bifurcates into the LAD and LCx arteries.  Left Anterior Descending Coronary Artery: Large anterior artery that wraps around the apex. No disease. Large D1 and small D2, no disease.  Left Circumflex Artery: AV groove vessel, no disease. Small high OM1, no disease. Large distal OM2 which branches, no disease.  Aorta: Normal size, 31 mm at the mid ascending aorta (level of the PA bifurcation) measured double oblique. No calcifications. No dissection.  Aortic Valve: Trileaflet. No calcifications.  Other findings:  Normal pulmonary vein drainage into the left atrium.  Normal left atrial appendage without a thrombus.  Normal size of the pulmonary artery.  IMPRESSION: 1. No evidence of CAD, CADRADS = 0.  2. Coronary artery calcium score is 0.  3. Normal  coronary origin with right dominance.  4. Consider non-coronary causes of chest pain/dyspnea.  Electronically Signed: By: Chrystie Nose M.D. On: 03/09/2024 12:45 Note: Reviewed        Physical Exam  General appearance: Well nourished, well developed, and well hydrated. In no apparent acute distress Mental status: Alert, oriented x 3 (person, place, & time)       Respiratory: No evidence of acute respiratory distress Eyes: PERLA Vitals: BP (!) 149/101   Pulse 93   Temp (!) 97 F (36.1 C)   Resp 16   Ht 5\' 8"  (1.727 m)   Wt 200 lb (90.7 kg)   SpO2 99%   BMI 30.41 kg/m  BMI: Estimated body mass index is 30.41 kg/m as calculated from the following:  Height as of this encounter: 5\' 8"  (1.727 m).   Weight as of this encounter: 200 lb (90.7 kg). Ideal: Ideal body weight: 63.9 kg (140 lb 14 oz) Adjusted ideal body weight: 74.6 kg (164 lb 8.4 oz)  Cervical Spine Area Exam  Skin & Axial Inspection: No masses, redness, edema, swelling, or associated skin lesions Alignment: Symmetrical Functional ROM: Pain restricted ROM, bilaterally Stability: No instability detected Muscle Tone/Strength: Functionally intact. No obvious neuro-muscular anomalies detected. Sensory (Neurological): Unimpaired Palpation: No palpable anomalies             Upper Extremity (UE) Exam    Side: Right upper extremity  Side: Left upper extremity  Skin & Extremity Inspection: Skin color, temperature, and hair growth are WNL. No peripheral edema or cyanosis. No masses, redness, swelling, asymmetry, or associated skin lesions. No contractures.  Skin & Extremity Inspection: Skin color, temperature, and hair growth are WNL. No peripheral edema or cyanosis. No masses, redness, swelling, asymmetry, or associated skin lesions. No contractures.  Functional ROM: Pain restricted ROM for shoulder and elbow  Functional ROM: Unrestricted ROM          Muscle Tone/Strength: Functionally intact. No obvious neuro-muscular  anomalies detected.  Muscle Tone/Strength: Functionally intact. No obvious neuro-muscular anomalies detected.  Sensory (Neurological): Arthropathic arthralgia          Sensory (Neurological): Unimpaired          Palpation: No palpable anomalies              Palpation: No palpable anomalies              Provocative Test(s):  Phalen's test: deferred Tinel's test: deferred Apley's scratch test (touch opposite shoulder):  Action 1 (Across chest): Decreased ROM Action 2 (Overhead): Decreased ROM Action 3 (LB reach): Decreased ROM   Provocative Test(s):  Phalen's test: deferred Tinel's test: deferred Apley's scratch test (touch opposite shoulder):  Action 1 (Across chest): deferred Action 2 (Overhead): deferred Action 3 (LB reach): deferred    Lumbar Spine Area Exam  Skin & Axial Inspection: No masses, redness, or swelling Alignment: Symmetrical Functional ROM: Pain restricted ROM affecting both sides Stability: No instability detected Muscle Tone/Strength: Functionally intact. No obvious neuro-muscular anomalies detected. Sensory (Neurological): Musculoskeletal pain pattern  Lower Extremity Exam    Side: Right lower extremity  Side: Left lower extremity  Stability: No instability observed          Stability: No instability observed          Skin & Extremity Inspection: Skin color, temperature, and hair growth are WNL. No peripheral edema or cyanosis. No masses, redness, swelling, asymmetry, or associated skin lesions. No contractures.  Skin & Extremity Inspection: Skin color, temperature, and hair growth are WNL. No peripheral edema or cyanosis. No masses, redness, swelling, asymmetry, or associated skin lesions. No contractures.  Functional ROM: Pain restricted ROM for knee joint          Functional ROM: Pain restricted ROM for knee joint          Muscle Tone/Strength: Functionally intact. No obvious neuro-muscular anomalies detected.  Muscle Tone/Strength: Functionally intact. No  obvious neuro-muscular anomalies detected.  Sensory (Neurological): Arthropathic arthralgia        Sensory (Neurological): Arthropathic arthralgia        DTR: Patellar: deferred today Achilles: deferred today Plantar: deferred today  DTR: Patellar: deferred today Achilles: deferred today Plantar: deferred today  Palpation: No palpable anomalies  Palpation: No palpable anomalies    Assessment  Diagnosis Status  1. Primary osteoarthritis of right shoulder   2. Chronic right shoulder pain   3. Lumbar spondylosis   4. Lumbar facet arthropathy   5. Chronic pain syndrome   6. Bilateral primary osteoarthritis of knee    Having a Flare-up Having a Flare-up Having a Flare-up   Updated Problems: No problems updated.  Plan of Care  Assessment and Plan    Right shoulder pain   Chronic right shoulder pain has recurred, causing discomfort despite significant relief from previous injections. She prefers a shoulder injection due to past relief. The procedure will be performed with local anesthesia to allow her to drive. Schedule the right shoulder injection with local anesthesia in 2-3 weeks.  Chronic pain management   Chronic pain is well-managed with gabapentin, Cymbalta, and hydrocodone. Current doses are effective, and Cymbalta is not increased due to hypertension concerns. Hydrocodone refill is due on April 17th. Refill Cymbalta and hydrocodone prescriptions. Continue Cymbalta at 20 mg and plan hydrocodone refill on April 17th.  Knee osteoarthritis with bone spurs   Chronic knee pain is due to arthritis and bone spurs. Previous orthopedic consultation suggested arthroscopy for debridement rather than knee replacement. She has not pursued treatment due to lack of PTO but is now interested. Arthroscopy may be beneficial, though knee replacement may be required in the future. Contact orthopedics to schedule evaluation and potential MRI. Discuss arthroscopy versus knee replacement with the  orthopedist based on MRI findings.  Venous insufficiency   Leg and ankle swelling occur, particularly when sitting for prolonged periods, but improve with walking, suggesting venous pooling. Ergonomic seating support and regular breaks to walk have reduced swelling. Encourage regular movement and stretching to prevent venous pooling. Advise ergonomic seating support to improve posture and reduce swelling.        [name] has a current medication list which includes the following long-term medication(s): gabapentin and duloxetine.  Pharmacotherapy (Medications Ordered): Meds ordered this encounter  Medications   HYDROcodone-acetaminophen (NORCO) 7.5-325 MG tablet    Sig: Take 1 tablet by mouth every 8 (eight) hours as needed for moderate pain (pain score 4-6).    Dispense:  90 tablet    Refill:  0   HYDROcodone-acetaminophen (NORCO) 7.5-325 MG tablet    Sig: Take 1 tablet by mouth every 8 (eight) hours as needed for moderate pain (pain score 4-6).    Dispense:  90 tablet    Refill:  0   HYDROcodone-acetaminophen (NORCO) 7.5-325 MG tablet    Sig: Take 1 tablet by mouth every 8 (eight) hours as needed for moderate pain (pain score 4-6).    Dispense:  90 tablet    Refill:  0   DULoxetine (CYMBALTA) 20 MG capsule    Sig: Take 1 capsule (20 mg total) by mouth daily.    Dispense:  30 capsule    Refill:  5  Continue Gabapentin as prescribed  Orders:  Orders Placed This Encounter  Procedures   SHOULDER INJECTION    Standing Status:   Future    Expected Date:   04/15/2024    Expiration Date:   06/25/2024    Scheduling Instructions:     Procedure: Intra-articular shoulder (Glenohumeral) joint injection     Side: RIGHT     Level: Glenohumeral joint               Sedation: Patient's choice.     Timeframe: As permitted by the schedule    Where will this procedure  be performed?:   [facility]   Follow-up plan:   Return in about 7 weeks (around 05/13/2024) for Right  shoulder injection, in clinic [place].     s/p IA hyalgan #1 on 08/19/2019, #2 09/23/2019 not very effective unfortunately.  Lumbar MRI shows facet disease at L3, L4, L5 bilaterally.  Bilateral L3, L4, L5 medial branch nerve block #1; right glenohumeral shoulder joint injection (anterior approach) 10/19/2020, 03/13/2021              Recent Visits Date Type Provider Dept  12/31/23 Office Visit Edward Jolly, MD [facility]  Showing recent visits within past 90 days and meeting all other requirements Today's Visits Date Type Provider Dept  03/26/24 Office Visit Edward Jolly, MD [facility]  Showing today's visits and meeting all other requirements Future Appointments No visits were found meeting these conditions. Showing future appointments within next 90 days and meeting all other requirements  I discussed the assessment and treatment plan with the patient. The patient was provided an opportunity to ask questions and all were answered. The patient agreed with the plan and demonstrated an understanding of the instructions.  Patient advised to call back or seek an in-person evaluation if the symptoms or condition worsens.  Duration of encounter: .  Total time on encounter, as per AMA guidelines included both the face-to-face and non-face-to-face time personally spent by the physician and/or other qualified health care professional(s) on the day of the encounter (includes time in activities that require the physician or other qualified health care professional and does not include time in activities normally performed by clinical staff). Physician's time may include the following activities when performed: Preparing to see the patient (e.g., pre-charting review of records, searching for previously ordered imaging, lab work, and nerve conduction tests) Review of prior analgesic pharmacotherapies. Reviewing PMP Interpreting ordered tests (e.g., lab work, imaging, nerve  conduction tests) Performing post-procedure evaluations, including interpretation of diagnostic procedures Obtaining and/or reviewing separately obtained history Performing a medically appropriate examination and/or evaluation Counseling and educating the patient/family/caregiver Ordering medications, tests, or procedures Referring and communicating with other health care professionals (when not separately reported) Documenting clinical information in the electronic or other health record Independently interpreting results (not separately reported) and communicating results to the patient/ family/caregiver Care coordination (not separately reported)  Note by: Edward Jolly, MD (TTS and AI technology used. I apologize for any typographical errors that were not detected and corrected.) Date: 03/26/2024; Time: 3:52 PM

## 2024-04-07 ENCOUNTER — Other Ambulatory Visit: Payer: Self-pay | Admitting: Family Medicine

## 2024-04-07 DIAGNOSIS — N6489 Other specified disorders of breast: Secondary | ICD-10-CM

## 2024-04-08 ENCOUNTER — Other Ambulatory Visit: Payer: Self-pay | Admitting: Family Medicine

## 2024-04-08 ENCOUNTER — Encounter: Payer: Self-pay | Admitting: Student in an Organized Health Care Education/Training Program

## 2024-04-08 DIAGNOSIS — N6489 Other specified disorders of breast: Secondary | ICD-10-CM

## 2024-04-13 ENCOUNTER — Ambulatory Visit
Admission: RE | Admit: 2024-04-13 | Discharge: 2024-04-13 | Disposition: A | Discharge status: Home / Self Care | Payer: BC Managed Care – PPO | Source: Ambulatory Visit | Attending: Family Medicine | Admitting: Family Medicine

## 2024-04-13 ENCOUNTER — Ambulatory Visit

## 2024-04-13 DIAGNOSIS — N6489 Other specified disorders of breast: Secondary | ICD-10-CM

## 2024-05-06 ENCOUNTER — Ambulatory Visit
Attending: Student in an Organized Health Care Education/Training Program | Admitting: Student in an Organized Health Care Education/Training Program

## 2024-05-06 ENCOUNTER — Encounter: Payer: Self-pay | Admitting: Student in an Organized Health Care Education/Training Program

## 2024-05-06 ENCOUNTER — Ambulatory Visit
Admission: RE | Admit: 2024-05-06 | Discharge: 2024-05-06 | Disposition: A | Discharge status: Home / Self Care | Source: Ambulatory Visit | Attending: Student in an Organized Health Care Education/Training Program | Admitting: Student in an Organized Health Care Education/Training Program

## 2024-05-06 VITALS — BP 128/79 | HR 86 | Temp 98.0°F | Resp 18 | Ht 68.0 in | Wt 220.0 lb

## 2024-05-06 DIAGNOSIS — M25511 Pain in right shoulder: Secondary | ICD-10-CM | POA: Insufficient documentation

## 2024-05-06 DIAGNOSIS — M5416 Radiculopathy, lumbar region: Secondary | ICD-10-CM | POA: Insufficient documentation

## 2024-05-06 DIAGNOSIS — G894 Chronic pain syndrome: Secondary | ICD-10-CM | POA: Insufficient documentation

## 2024-05-06 DIAGNOSIS — M19011 Primary osteoarthritis, right shoulder: Secondary | ICD-10-CM | POA: Diagnosis present

## 2024-05-06 DIAGNOSIS — M47816 Spondylosis without myelopathy or radiculopathy, lumbar region: Secondary | ICD-10-CM | POA: Diagnosis present

## 2024-05-06 DIAGNOSIS — G8929 Other chronic pain: Secondary | ICD-10-CM | POA: Insufficient documentation

## 2024-05-06 MED ORDER — ROPIVACAINE HCL 2 MG/ML IJ SOLN
4.0000 mL | Freq: Once | INTRAMUSCULAR | Status: AC
  Administered 2024-05-06: 4 mL via INTRA_ARTICULAR

## 2024-05-06 MED ORDER — ROPIVACAINE HCL 2 MG/ML IJ SOLN
INTRAMUSCULAR | Status: AC
  Filled 2024-05-06: qty 20

## 2024-05-06 MED ORDER — LIDOCAINE HCL 2 % IJ SOLN
20.0000 mL | Freq: Once | INTRAMUSCULAR | Status: AC
  Administered 2024-05-06: 100 mg

## 2024-05-06 MED ORDER — METHYLPREDNISOLONE ACETATE 40 MG/ML IJ SUSP
40.0000 mg | Freq: Once | INTRAMUSCULAR | Status: AC
  Administered 2024-05-06: 40 mg via INTRA_ARTICULAR

## 2024-05-06 MED ORDER — LIDOCAINE HCL (PF) 2 % IJ SOLN
INTRAMUSCULAR | Status: AC
Start: 2024-05-06 — End: ?
  Filled 2024-05-06: qty 5

## 2024-05-06 MED ORDER — METHYLPREDNISOLONE ACETATE 40 MG/ML IJ SUSP
INTRAMUSCULAR | Status: AC
Start: 2024-05-06 — End: ?
  Filled 2024-05-06: qty 1

## 2024-05-06 MED ORDER — IOHEXOL 180 MG/ML  SOLN
10.0000 mL | Freq: Once | INTRAMUSCULAR | Status: AC
  Administered 2024-05-06: 10 mL via INTRA_ARTICULAR
  Filled 2024-05-06: qty 20

## 2024-05-06 NOTE — Patient Instructions (Signed)

## 2024-05-06 NOTE — Progress Notes (Signed)
 Safety precautions to be maintained throughout the outpatient stay will include: orient to surroundings, keep bed in low position, maintain call bell within reach at all times, provide assistance with transfer out of bed and ambulation.

## 2024-05-06 NOTE — Progress Notes (Signed)
 PROVIDER NOTE: Information contained herein reflects review and annotations entered in association with encounter. Interpretation of such information and data should be left to medically-trained personnel. Information provided to patient can be located elsewhere in the medical record under "Patient Instructions". Document created using STT-dictation technology, any transcriptional errors that may result from process are unintentional.    Patient: [name]  Service Category: Procedure  Provider: Cephus Collin, MD  DOB: [birth year]  DOS: 05/06/2024  Location: [facility]  MRN: [id number]  Setting: Ambulatory - outpatient  Referring Provider: Rhina Center, NP  Type: Established Patient  Specialty: Interventional Pain Management  PCP: Rhina Center, NP   Primary Reason for Visit: Interventional Pain Management Treatment. CC: Shoulder Pain (right)  Procedure:          Anesthesia, Analgesia, Anxiolysis:  Type: Therapeutic Glenohumeral Joint (shoulder) Injection   Primary Purpose: Diagnostic Region: Anterior Shoulder Area Level:  Shoulder Target Area: Glenohumeral Joint (shoulder) Approach: Anterolateral approach. Laterality: Right-Sided  Type: Local Anesthesia  Local Anesthetic: Lidocaine  1-2%  Position: Supine   Indications: Right rotator cuff dysfunction, right shoulder pain, right glenohumeral arthropathy.  Pain Score: Pre-procedure: 8 /10 Post-procedure: 0/10   Pre-op Assessment:  [name] is a 63 y.o. (year old), female patient, seen today for interventional treatment. She  has a past surgical history that includes Laparoscopic hysterectomy (10/23/2010); Midurethral sling (05/02/2011); excision of vulvar lesion (05/02/2011); and Breast biopsy (Left, 10/11/2023). [name] has a current medication list which includes the following prescription(s): duloxetine , gabapentin , hydrocodone -acetaminophen , [START ON 06/01/2024] hydrocodone -acetaminophen , melatonin,  multivitamin, and valacyclovir . Her primarily concern today is the Shoulder Pain (right)  Initial Vital Signs:  Pulse/HCG Rate: 86ECG Heart Rate: 67 Temp: 98 F (36.7 C) Resp: 16 BP: 135/85 SpO2: 97 %  BMI: Estimated body mass index is 33.45 kg/m as calculated from the following:   Height as of this encounter: 5\' 8"  (1.727 m).   Weight as of this encounter: 220 lb (99.8 kg).  Risk Assessment: Allergies: Reviewed. She has no known allergies.  Allergy Precautions: None required Coagulopathies: Reviewed. None identified.  Blood-thinner therapy: None at this time Active Infection(s): Reviewed. None identified. [name] is afebrile  Site Confirmation: [name] was asked to confirm the procedure and laterality before marking the site Procedure checklist: Completed Consent: Before the procedure and under the influence of no sedative(s), amnesic(s), or anxiolytics, the patient was informed of the treatment options, risks and possible complications. To fulfill our ethical and legal obligations, as recommended by the American Medical Association's Code of Ethics, I have informed the patient of my clinical impression; the nature and purpose of the treatment or procedure; the risks, benefits, and possible complications of the intervention; the alternatives, including doing nothing; the risk(s) and benefit(s) of the alternative treatment(s) or procedure(s); and the risk(s) and benefit(s) of doing nothing. The patient was provided information about the general risks and possible complications associated with the procedure. These may include, but are not limited to: failure to achieve desired goals, infection, bleeding, organ or nerve damage, allergic reactions, paralysis, and death. In addition, the patient was informed of those risks and complications associated to the procedure, such as failure to decrease pain; infection; bleeding; organ or nerve damage with subsequent damage to sensory, motor,  and/or autonomic systems, resulting in permanent pain, numbness, and/or weakness of one or several areas of the body; allergic reactions; (i.e.: anaphylactic reaction); and/or death. Furthermore, the patient was informed of those risks and complications associated with  the medications. These include, but are not limited to: allergic reactions (i.e.: anaphylactic or anaphylactoid reaction(s)); adrenal axis suppression; blood sugar elevation that in diabetics may result in ketoacidosis or comma; water retention that in patients with history of congestive heart failure may result in shortness of breath, pulmonary edema, and decompensation with resultant heart failure; weight gain; swelling or edema; medication-induced neural toxicity; particulate matter embolism and blood vessel occlusion with resultant organ, and/or nervous system infarction; and/or aseptic necrosis of one or more joints. Finally, the patient was informed that Medicine is not an exact science; therefore, there is also the possibility of unforeseen or unpredictable risks and/or possible complications that may result in a catastrophic outcome. The patient indicated having understood very clearly. We have given the patient no guarantees and we have made no promises. Enough time was given to the patient to ask questions, all of which were answered to the patient's satisfaction. [name] has indicated that she wanted to continue with the procedure. Attestation: I, the ordering provider, attest that I have discussed with the patient the benefits, risks, side-effects, alternatives, likelihood of achieving goals, and potential problems during recovery for the procedure that I have provided informed consent. Date  Time: 05/06/2024  8:14 AM  Pre-Procedure Preparation:  Monitoring: As per clinic protocol. Respiration, ETCO2, SpO2, BP, heart rate and rhythm monitor placed and checked for adequate function Safety Precautions: Patient was assessed for  positional comfort and pressure points before starting the procedure. Time-out: I initiated and conducted the "Time-out" before starting the procedure, as per protocol. The patient was asked to participate by confirming the accuracy of the "Time Out" information. Verification of the correct person, site, and procedure were performed and confirmed by me, the nursing staff, and the patient. "Time-out" conducted as per Joint Commission's Universal Protocol (UP.01.01.01). Time: 48  Description of Procedure:          Area Prepped: Entire shoulder Area DuraPrep (Iodine Povacrylex [0.7% available iodine] and Isopropyl Alcohol, 74% w/w) Safety Precautions: Aspiration looking for blood return was conducted prior to all injections. At no point did we inject any substances, as a needle was being advanced. No attempts were made at seeking any paresthesias. Safe injection practices and needle disposal techniques used. Medications properly checked for expiration dates. SDV (single dose vial) medications used. Description of the Procedure: Protocol guidelines were followed. The patient was placed in position over the procedure table. The target area was identified and the area prepped in the usual manner. Skin & deeper tissues infiltrated with local anesthetic. Appropriate amount of time allowed to pass for local anesthetics to take effect. The procedure needles were then advanced to the target area. Proper needle placement secured. Negative aspiration confirmed. Solution injected in intermittent fashion, asking for systemic symptoms every 0.5cc of injectate. The needles were then removed and the area cleansed, making sure to leave some of the prepping solution back to take advantage of its long term bactericidal properties.         Vitals:   05/06/24 0820 05/06/24 0843 05/06/24 0848  BP: 135/85 124/86 128/79  Pulse: 86    Resp: 16 18 18   Temp: 98 F (36.7 C)    SpO2: 97% 99% 98%  Weight: 220 lb (99.8 kg)     Height: 5\' 8"  (1.727 m)      Start Time: 0845 hrs. End Time: 0848 hrs. Materials:  Needle(s) Type: Spinal Needle Gauge: 22G Length: 3.5-in Medication(s): Please see orders for medications and dosing details. 5  cc solution made of 4 cc of 0.2% ropivacaine , 1 cc of methylprednisolone  40 mg/cc Injected after contrast confirmation Imaging Guidance (Non-Spinal):          Type of Imaging Technique: Fluoroscopy Guidance (Non-Spinal) Indication(s): Assistance in needle guidance and placement for procedures requiring needle placement in or near specific anatomical locations not easily accessible without such assistance. Exposure Time: Please see nurses notes. Contrast: Before injecting any contrast, we confirmed that the patient did not have an allergy to iodine, shellfish, or radiological contrast. Once satisfactory needle placement was completed at the desired level, radiological contrast was injected. Contrast injected under live fluoroscopy. No contrast complications. See chart for type and volume of contrast used. Fluoroscopic Guidance: I was personally present during the use of fluoroscopy. "Tunnel Vision Technique" used to obtain the best possible view of the target area. Parallax error corrected before commencing the procedure. "Direction-depth-direction" technique used to introduce the needle under continuous pulsed fluoroscopy. Once target was reached, antero-posterior, oblique, and lateral fluoroscopic projection used confirm needle placement in all planes. Images permanently stored in EMR. Interpretation: I personally interpreted the imaging intraoperatively. Adequate needle placement confirmed in multiple planes. Appropriate spread of contrast into desired area was observed. No evidence of afferent or efferent intravascular uptake. Permanent images saved into the patient's record.  Post-operative Assessment:  Post-procedure Vital Signs:  Pulse/HCG Rate: 8665 Temp: 98 F (36.7 C) Resp:  18 BP: 128/79 SpO2: 98 %  EBL: None  Complications: No immediate post-treatment complications observed by team, or reported by patient.  Note: The patient tolerated the entire procedure well. A repeat set of vitals were taken after the procedure and the patient was kept under observation following institutional policy, for this type of procedure. Post-procedural neurological assessment was performed, showing return to baseline, prior to discharge. The patient was provided with post-procedure discharge instructions, including a section on how to identify potential problems. Should any problems arise concerning this procedure, the patient was given instructions to immediately contact us , at any time, without hesitation. In any case, we plan to contact the patient by telephone for a follow-up status report regarding this interventional procedure.  Comments:  No additional relevant information.  Plan of Care  Patient continues to endorse fairly significant axial low back pain related to lumbar spondylosis and facet arthropathy.  She is status post 3 positive diagnostic lumbar facet medial branch nerve blocks, the last of which was done 05/29/2023.  We discussed Sprint peripheral nerve stimulation of the lumbar medial branch nerves.    I explained to pt how Sprint peripheral nerve stimulation (PNS)  is typically considered for patients with chronic, localized pain that is not responding to conservative treatments such as medications, physical therapy, or injections.  The SPRINT peripheral nerve stimulator is designed for short-term, percutaneous use (approximately 60 days) to modulate pain through targeted nerve stimulation. Unlike traditional permanent implants, SPRINT is temporary but can lead to long-lasting pain relief by altering pain signals.  We discussed the risks and benefits of peripheral nerve stimulation. Benefits: minimally invasive, does not require permanent implantation, can offer  significant pain relief, improving function and quality of life, may reduce the need for long-term opioid use. The risks/challenges include (but not limited to):  infection or irritation at the stimulation site, discomfort from electrode placement, risk of incomplete pain relief or temporary relief post-removal, limited to short-term therapy, which may be a disadvantage in chronic, refractory cases    Orders:  Orders Placed This Encounter  Procedures  Implantable Peripheral Nerve Stimulator    Standing Status:   Future    Expected Date:   05/20/2024    Expiration Date:   08/06/2024    Scheduling Instructions:     Procedure: Temporary implantable peripheral nerve stimulator     Side:  Bilateral     Nerve site: L4 medial branch     Sedation: Patient's choice.     Timeframe: ASAA    Location of Procedure:   [facility]   DG PAIN [facility] C-ARM 1-60 MIN NO REPORT    Intraoperative interpretation by procedural physician at [facility].    Standing Status:   Standing    Number of Occurrences:   1    Reason for exam::   Assistance in needle guidance and placement for procedures requiring needle placement in or near specific anatomical locations not easily accessible without such assistance.    Medications ordered for procedure: Meds ordered this encounter  Medications   iohexol  (OMNIPAQUE ) 180 MG/ML injection 10 mL    Must be Myelogram-compatible. If not available, you may substitute with a water-soluble, non-ionic, hypoallergenic, myelogram-compatible radiological contrast medium.   lidocaine  (XYLOCAINE ) 2 % (with pres) injection 400 mg   methylPREDNISolone  acetate (DEPO-MEDROL ) injection 40 mg   ropivacaine  (PF) 2 mg/mL (0.2%) (NAROPIN ) injection 4 mL   Medications administered: We administered iohexol , lidocaine , methylPREDNISolone  acetate, and ropivacaine  (PF) 2 mg/mL (0.2%).  See the medical record for exact dosing, route, and time of  administration.  Follow-up plan:   Return for Keep sch. appt.     Recent Visits Date Type Provider Dept  03/26/24 Office Visit Cephus Collin, MD [facility]  Showing recent visits within past 90 days and meeting all other requirements Today's Visits Date Type Provider Dept  05/06/24 Procedure visit Cephus Collin, MD [facility]  Showing today's visits and meeting all other requirements Future Appointments Date Type Provider Dept  06/25/24 Appointment Patel, Seema K, NP [facility]  Showing future appointments within next 90 days and meeting all other requirements  Disposition: Discharge home  Discharge (Date  Time): 05/06/2024; 0900 hrs.   Primary Care Physician: Rhina Center, NP Location: [facility] Note by: Cephus Collin, MD Date: 05/06/2024; Time: 10:15 AM  Disclaimer:  Medicine is not an exact science. The only guarantee in medicine is that nothing is guaranteed. It is important to note that the decision to proceed with this intervention was based on the information collected from the patient. The Data and conclusions were drawn from the patient's questionnaire, the interview, and the physical examination. Because the information was provided in large part by the patient, it cannot be guaranteed that it has not been purposely or unconsciously manipulated. Every effort has been made to obtain as much relevant data as possible for this evaluation. It is important to note that the conclusions that lead to this procedure are derived in large part from the available data. Always take into account that the treatment will also be dependent on availability of resources and existing treatment guidelines, considered by other Pain Management Practitioners as being common knowledge and practice, at the time of the intervention. For Medico-Legal purposes, it is also important to point out that variation in procedural techniques and  pharmacological choices are the acceptable norm. The indications, contraindications, technique, and results of the above procedure should only be interpreted and judged by a Board-Certified Interventional Pain Specialist with extensive familiarity and expertise in the same  exact procedure and technique.

## 2024-05-07 ENCOUNTER — Telehealth: Payer: Self-pay

## 2024-05-07 NOTE — Telephone Encounter (Signed)
 Post procedure follow up.  Patient states she is doing well.   ?

## 2024-06-25 ENCOUNTER — Ambulatory Visit: Attending: Nurse Practitioner | Admitting: Nurse Practitioner

## 2024-06-25 ENCOUNTER — Encounter: Payer: Self-pay | Admitting: Nurse Practitioner

## 2024-06-25 ENCOUNTER — Encounter: Admitting: Student in an Organized Health Care Education/Training Program

## 2024-06-25 DIAGNOSIS — M797 Fibromyalgia: Secondary | ICD-10-CM | POA: Insufficient documentation

## 2024-06-25 DIAGNOSIS — G8929 Other chronic pain: Secondary | ICD-10-CM | POA: Insufficient documentation

## 2024-06-25 DIAGNOSIS — M47816 Spondylosis without myelopathy or radiculopathy, lumbar region: Secondary | ICD-10-CM | POA: Diagnosis present

## 2024-06-25 DIAGNOSIS — G894 Chronic pain syndrome: Secondary | ICD-10-CM | POA: Diagnosis present

## 2024-06-25 DIAGNOSIS — M25511 Pain in right shoulder: Secondary | ICD-10-CM | POA: Insufficient documentation

## 2024-06-25 DIAGNOSIS — M4726 Other spondylosis with radiculopathy, lumbar region: Secondary | ICD-10-CM | POA: Diagnosis not present

## 2024-06-25 DIAGNOSIS — M19011 Primary osteoarthritis, right shoulder: Secondary | ICD-10-CM | POA: Diagnosis present

## 2024-06-25 DIAGNOSIS — M5416 Radiculopathy, lumbar region: Secondary | ICD-10-CM | POA: Insufficient documentation

## 2024-06-25 MED ORDER — HYDROCODONE-ACETAMINOPHEN 7.5-325 MG PO TABS: 1.0000 | ORAL_TABLET | Freq: Three times a day (TID) | ORAL | 0 refills | Status: AC | PRN

## 2024-06-25 MED ORDER — GABAPENTIN 600 MG PO TABS: 600.0000 mg | ORAL_TABLET | Freq: Every day | ORAL | 5 refills | Status: DC

## 2024-06-25 MED ORDER — HYDROCODONE-ACETAMINOPHEN 7.5-325 MG PO TABS: 1.0000 | ORAL_TABLET | Freq: Three times a day (TID) | ORAL | 0 refills | Status: DC | PRN

## 2024-06-25 NOTE — Progress Notes (Signed)
 PROVIDER NOTE: Interpretation of information contained herein should be left to medically-trained personnel. Specific patient instructions are provided elsewhere under Patient Instructions section of medical record. This document was created in part using AI and STT-dictation technology, any transcriptional errors that may result from this process are unintentional.  Patient: [name]  Service: E/M   PCP: Moishe Suzen LABOR, NP (Inactive)  DOB: 10/22/1961  DOS: 06/25/2024  Provider: Emmy MARLA Blanch, NP  MRN: [id number]  Delivery: Face-to-face  Specialty: Interventional Pain Management  Type: Established Patient  Setting: Ambulatory outpatient facility  Specialty designation: 09  Referring Prov.: Moishe Suzen LABOR, NP  Location: Outpatient office facility       History of present illness (HPI) [name], a 63 y.o. year old female, is here today because of her No primary diagnosis found.. [name] primary complain today is Back Pain (lower)  Pertinent problems: [name] has Obesity; Arthritis of wrist; Chronic pain syndrome; Lumbar spondylosis; Bilateral primary osteoarthritis of knee; Bilateral hip pain; Chronic SI joint pain; Lumbar radiculopathy; Right rotator cuff tear arthropathy; Long-term current use of opioid analgesics; Fibromyalgia; and Cervical radicular pain (Left) on the pertinent problem list  Pain Assessment: Severity of Chronic pain is reported as a 8 /10. Location: Back Lower/ . Onset: More than a month ago. Quality: Aching, Sore, Stabbing. Timing: Constant. Modifying factor(s): heating pad, medications, lying down. Vitals:  height is 5' 8 (1.727 m) and weight is 220 lb (99.8 kg). Her temporal temperature is 97.2 F (36.2 C) (abnormal). Her blood pressure is 131/93 (abnormal) and her pulse is 74. Her respiration is 18 and oxygen saturation is 99%.  BMI: Estimated body mass index is 33.45 kg/m as calculated from the following:   Height as of this encounter: 5' 8  (1.727 m).   Weight as of this encounter: 220 lb (99.8 kg).  Last encounter: 03/26/2024 Last procedure: 05/06/2024  Reason for encounter: both, medication management and post-procedure evaluation and assessment. No change in medical history since last visit.  Patient's pain is at baseline.  Patient continues multimodal pain regimen as prescribed.  States that it provides pain relief and improvement in functional status.   [name] received a therapeutic Glenohumeral Joint (shoulder) injection on May 05, 2024.  She reports initially 100% pain relief during the local anesthetic phase, followed by sustained relief with 50% pain relief and functional movement since the procedure.  Procedure:           Anesthesia, Analgesia, Anxiolysis:  Type: Therapeutic Glenohumeral Joint (shoulder) Injection   Primary Purpose: Diagnostic Region: Anterior Shoulder Area Level:  Shoulder Target Area: Glenohumeral Joint (shoulder) Approach: Anterolateral approach. Laterality: Right-Sided   Type: Local Anesthesia   Local Anesthetic: Lidocaine  1-2%   Position: Supine    Indications: Right rotator cuff dysfunction, right shoulder pain, right glenohumeral arthropathy.   Pain Score: Pre-procedure: 8 /10 Post-procedure: 0/10  Post-Procedure Evaluation   Effectiveness:  Initial hour after procedure: 100 % . Subsequent 4-6 hours post-procedure: 100 % . Analgesia past initial 6 hours: 50 % . Ongoing improvement:  Analgesic:  [name] received a therapeutic Glenohumeral Joint (shoulder) injection on May 05, 2024.  She reports initially 100% pain relief during the local anesthetic phase, followed by sustained relief with 50% pain relief and functional movement since the procedure. Function: [name] reports improvement in function ROM: [name] reports improvement in ROM   Pharmacotherapy Assessment   Hydrocodone -acetaminophen  (Norco) 7.5-325 mg tablet every 8 hours as needed for  moderate pain.  MME=22.50 Monitoring: [place] PMP: PDMP reviewed during this encounter.       Pharmacotherapy: No side-effects or adverse reactions reported. Compliance: No problems identified. Effectiveness: Clinically acceptable.  Shela Reda CROME, RN  06/25/2024  3:00 PM  Sign when Signing Visit Nursing Pain Medication Assessment:  Safety precautions to be maintained throughout the outpatient stay will include: orient to surroundings, keep bed in low position, maintain call bell within reach at all times, provide assistance with transfer out of bed and ambulation.  Medication Inspection Compliance: Pill count conducted under aseptic conditions, in front of the patient. Neither the pills nor the bottle was removed from the patient's sight at any time. Once count was completed pills were immediately returned to the patient in their original bottle.  Medication: Hydrocodone /APAP Pill/Patch Count: 32 of 90 pills/patches remain Pill/Patch Appearance: Markings consistent with prescribed medication Bottle Appearance: Standard pharmacy container. Clearly labeled. Filled Date: 06 / 17 / 2025 Last Medication intake:  Today    UDS:  Summary  Date Value Ref Range Status  09/26/2023 Note  Final    Comment:    ==================================================================== [employer] (MW) ==================================================================== Test                             Result       Flag       Units  Drug Present and Declared for Prescription Verification   Hydrocodone                     525          EXPECTED   ng/mg creat   Hydromorphone                  120          EXPECTED   ng/mg creat   Dihydrocodeine                 102          EXPECTED   ng/mg creat   Norhydrocodone                 1270         EXPECTED   ng/mg creat    Sources of hydrocodone  include scheduled prescription medications.    Hydromorphone, dihydrocodeine and norhydrocodone are expected    metabolites of  hydrocodone . Hydromorphone and dihydrocodeine are    also available as scheduled prescription medications.  ==================================================================== Test                      Result    Flag   Units      Ref Range   Creatinine              125              mg/dL      >=79 ==================================================================== Declared Medications:  The flagging and interpretation on this report are based on the  following declared medications.  Unexpected results may arise from  inaccuracies in the declared medications.   **Note: The testing scope of this panel includes these medications:   Hydrocodone  (Norco)   **Note: The testing scope of this panel does not include the  following reported medications:   Acetaminophen  (Norco)  Gabapentin   Melatonin  Multivitamin  Valacyclovir  (Valtrex ) ==================================================================== For clinical consultation, please call [phone]. ====================================================================     No results found for: CBDTHCR  No results found for: D8THCCBX No results found for: D9THCCBX  ROS  Constitutional: Denies any fever or chills Gastrointestinal: No reported hemesis, hematochezia, vomiting, or acute GI distress Musculoskeletal: Lower back pain Neurological: No reported episodes of acute onset apraxia, aphasia, dysarthria, agnosia, amnesia, paralysis, loss of coordination, or loss of consciousness  Medication Review  DULoxetine , HYDROcodone -acetaminophen , Melatonin, gabapentin , multivitamin, omeprazole , and valACYclovir   History Review  Allergy: [name] has no known allergies. Drug: [name]  reports no history of drug use. Alcohol:  reports that she does not currently use alcohol. Tobacco:  reports that she quit smoking about 21 years ago. Her smoking use included cigarettes. She has never used smokeless tobacco. Social: Ms.  [name]  reports that she quit smoking about 21 years ago. Her smoking use included cigarettes. She has never used smokeless tobacco. She reports that she does not currently use alcohol. She reports that she does not use drugs. Medical:  has a past medical history of Adult BMI 32.0-32.9 kg/sq m (10/28/2020), Arthritis, Arthritis of carpometacarpal [facility]) joint of both thumbs (03/19/2023), Arthritis of wrist (07/04/2011), Bilateral hip pain (10/13/2019), Bilateral primary osteoarthritis of knee (10/13/2019), Bilateral wrist pain (09/21/2021), Cervical radicular pain (left) (12/26/2021), Chronic pain syndrome (01/30/2019), Chronic right shoulder pain (09/27/2020), Chronic SI joint pain (10/13/2019), Degenerative disorder of bone, Dyslipidemia (09/04/2023), Elevated BP without diagnosis of hypertension (09/30/2020), Family history of diabetes mellitus (01/30/2019), Family history of heart disease (01/30/2019), Fever blister (08/21/2023), Fibromyalgia (09/21/2021), Genuine stress incontinence, female (2012), GERD (gastroesophageal reflux disease), History of squamous cell carcinoma of skin (10/28/2020), Hypertension (02/01/2021), Insomnia (01/13/2015), Long-term current use of opiate analgesic (01/02/2018), Lumbar radiculopathy (03/23/2020), Lumbar spondylosis (01/30/2019), Menopause (06/16/2018), Migraines, Obesity, Palpitations (08/21/2023), Prediabetes (02/01/2021), Primary osteoarthritis of right shoulder (09/27/2020), 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 Surgical: [name]  has a past surgical history that includes Laparoscopic hysterectomy (10/23/2010); Midurethral sling (05/02/2011); excision of vulvar lesion (05/02/2011); and Breast biopsy (Left, 10/11/2023). Family: family history includes Arthritis in her maternal  grandmother and mother; Cancer in her father and paternal grandfather; Diabetes in her son; Heart attack in her maternal grandfather, mother, and paternal grandmother; Hypertension in her mother.  Laboratory Chemistry Profile   Renal Lab Results  Component Value Date   BUN 9 11/21/2023   CREATININE 0.82 11/21/2023   BCR 11 (L) 11/21/2023   GFR 74.95 02/09/2021   GFRAA 92 06/16/2018   GFRNONAA >60 02/24/2021    Hepatic Lab Results  Component Value Date   AST 22 11/21/2023   ALT 24 11/21/2023   ALBUMIN 4.1 11/21/2023   ALKPHOS 98 11/21/2023    Electrolytes Lab Results  Component Value Date   NA 139 11/21/2023   K 4.3 11/21/2023   CL 106 11/21/2023   CALCIUM 9.7 11/21/2023   MG 1.8 01/30/2019    Bone Lab Results  Component Value Date   VD25OH 54.0 08/21/2023    Inflammation (CRP: Acute Phase) (ESR: Chronic Phase) No results found for: CRP, ESRSEDRATE, LATICACIDVEN       Note: Above Lab results reviewed.  Recent Imaging Review  DG PAIN CLINIC C-ARM 1-60 MIN NO REPORT Fluoro was used, but no Radiologist interpretation will be provided.  Please refer to NOTES tab for provider progress note. Note: Reviewed        Physical Exam  Vitals: BP (!) 131/93 (Cuff Size: Small)   Pulse 74  Temp (!) 97.2 F (36.2 C) (Temporal)   Resp 18   Ht 5' 8 (1.727 m)   Wt 220 lb (99.8 kg)   SpO2 99%   BMI 33.45 kg/m  BMI: Estimated body mass index is 33.45 kg/m as calculated from the following:   Height as of this encounter: 5' 8 (1.727 m).   Weight as of this encounter: 220 lb (99.8 kg). Ideal: Ideal body weight: 63.9 kg (140 lb 14 oz) Adjusted ideal body weight: 78.3 kg (172 lb 8.4 oz) General appearance: Well nourished, well developed, and well hydrated. In no apparent acute distress Mental status: Alert, oriented x 3 (person, place, & time)       Respiratory: No evidence of acute respiratory distress Eyes: PERLA   Assessment   Diagnosis Status  1. Primary  osteoarthritis of right shoulder   2. Chronic right shoulder pain   3. Lumbar spondylosis   4. Lumbar facet arthropathy   5. Chronic pain syndrome    Controlled Controlled Controlled   Updated Problems: No problems updated.  Plan of Care  Problem-specific:  Assessment and Plan We will continue on current medication regimen.  Prescribing drug monitoring (PDMP) reviewed; findings consistent with the use of prescribed medication and no evidence of narcotic misuse or abuse.  Urine drug screening (UDS) up-to-date.  No other new issues or problems reported to this visit.  Schedule follow-up in 90 days for medication management.   [name] has a current medication list which includes the following long-term medication(s): duloxetine  and gabapentin .  Pharmacotherapy (Medications Ordered): No orders of the defined types were placed in this encounter.  Orders:  No orders of the defined types were placed in this encounter.       No follow-ups on file.    Recent Visits Date Type Provider Dept  05/06/24 Procedure visit Marcelino Nurse, MD [facility]  Showing recent visits within past 90 days and meeting all other requirements Today's Visits Date Type Provider Dept  06/25/24 Office Visit Tessie Ordaz K, NP [facility]  Showing today's visits and meeting all other requirements Future Appointments No visits were found meeting these conditions. Showing future appointments within next 90 days and meeting all other requirements  I discussed the assessment and treatment plan with the patient. The patient was provided an opportunity to ask questions and all were answered. The patient agreed with the plan and demonstrated an understanding of the instructions.  Patient advised to call back or seek an in-person evaluation if the symptoms or condition worsens.  Duration of encounter: 30 minutes.  Total time on encounter, as per AMA guidelines included both the  face-to-face and non-face-to-face time personally spent by the physician and/or other qualified health care professional(s) on the day of the encounter (includes time in activities that require the physician or other qualified health care professional and does not include time in activities normally performed by clinical staff). Physician's time may include the following activities when performed: Preparing to see the patient (e.g., pre-charting review of records, searching for previously ordered imaging, lab work, and nerve conduction tests) Review of prior analgesic pharmacotherapies. Reviewing PMP Interpreting ordered tests (e.g., lab work, imaging, nerve conduction tests) Performing post-procedure evaluations, including interpretation of diagnostic procedures Obtaining and/or reviewing separately obtained history Performing a medically appropriate examination and/or evaluation Counseling and educating the patient/family/caregiver Ordering medications, tests, or procedures Referring and communicating with other health care professionals (when not separately reported) Documenting clinical information in the electronic  or other health record Independently interpreting results (not separately reported) and communicating results to the patient/ family/caregiver Care coordination (not separately reported)  Note by: Perle Gibbon K Mahad Newstrom, NP (TTS and AI technology used. I apologize for any typographical errors that were not detected and corrected.) Date: 06/25/2024; Time: 3:05 PM

## 2024-06-25 NOTE — Progress Notes (Signed)
 Nursing Pain Medication Assessment:  Safety precautions to be maintained throughout the outpatient stay will include: orient to surroundings, keep bed in low position, maintain call bell within reach at all times, provide assistance with transfer out of bed and ambulation.  Medication Inspection Compliance: Pill count conducted under aseptic conditions, in front of the patient. Neither the pills nor the bottle was removed from the patient's sight at any time. Once count was completed pills were immediately returned to the patient in their original bottle.  Medication: Hydrocodone /APAP Pill/Patch Count: 32 of 90 pills/patches remain Pill/Patch Appearance: Markings consistent with prescribed medication Bottle Appearance: Standard pharmacy container. Clearly labeled. Filled Date: 06 / 17 / 2025 Last Medication intake:  Today

## 2024-09-15 ENCOUNTER — Ambulatory Visit: Admitting: Nurse Practitioner

## 2024-09-15 ENCOUNTER — Ambulatory Visit
Admission: RE | Admit: 2024-09-15 | Discharge: 2024-09-15 | Disposition: A | Discharge status: Home / Self Care | Source: Ambulatory Visit | Attending: Nurse Practitioner | Admitting: Nurse Practitioner

## 2024-09-15 ENCOUNTER — Encounter: Payer: Self-pay | Admitting: Nurse Practitioner

## 2024-09-15 VITALS — BP 149/101 | HR 81 | Temp 97.2°F | Ht 68.0 in | Wt 205.0 lb

## 2024-09-15 DIAGNOSIS — M19011 Primary osteoarthritis, right shoulder: Secondary | ICD-10-CM

## 2024-09-15 DIAGNOSIS — M47816 Spondylosis without myelopathy or radiculopathy, lumbar region: Secondary | ICD-10-CM

## 2024-09-15 DIAGNOSIS — M12811 Other specific arthropathies, not elsewhere classified, right shoulder: Secondary | ICD-10-CM | POA: Insufficient documentation

## 2024-09-15 DIAGNOSIS — G894 Chronic pain syndrome: Secondary | ICD-10-CM | POA: Diagnosis not present

## 2024-09-15 DIAGNOSIS — M75101 Unspecified rotator cuff tear or rupture of right shoulder, not specified as traumatic: Secondary | ICD-10-CM | POA: Diagnosis not present

## 2024-09-15 DIAGNOSIS — Z79899 Other long term (current) drug therapy: Secondary | ICD-10-CM

## 2024-09-15 DIAGNOSIS — G8929 Other chronic pain: Secondary | ICD-10-CM

## 2024-09-15 DIAGNOSIS — M797 Fibromyalgia: Secondary | ICD-10-CM | POA: Insufficient documentation

## 2024-09-15 DIAGNOSIS — M25511 Pain in right shoulder: Secondary | ICD-10-CM

## 2024-09-15 MED ORDER — HYDROCODONE-ACETAMINOPHEN 7.5-325 MG PO TABS: 1.0000 | ORAL_TABLET | Freq: Three times a day (TID) | ORAL | 0 refills | Status: AC | PRN

## 2024-09-15 MED ORDER — GABAPENTIN 600 MG PO TABS: 600.0000 mg | ORAL_TABLET | Freq: Every day | ORAL | 5 refills | Status: AC

## 2024-09-15 MED ORDER — HYDROCODONE-ACETAMINOPHEN 7.5-325 MG PO TABS: 1.0000 | ORAL_TABLET | Freq: Three times a day (TID) | ORAL | 0 refills | Status: DC | PRN

## 2024-09-15 NOTE — Progress Notes (Signed)
 Nursing Pain Medication Assessment:  Safety precautions to be maintained throughout the outpatient stay will include: orient to surroundings, keep bed in low position, maintain call bell within reach at all times, provide assistance with transfer out of bed and ambulation.  Medication Inspection Compliance: Pill count conducted under aseptic conditions, in front of the patient. Neither the pills nor the bottle was removed from the patient's sight at any time. Once count was completed pills were immediately returned to the patient in their original bottle.  Medication: Hydrocodone /APAP Pill/Patch Count: 53 of 90 pills/patches remain Pill/Patch Appearance: Markings consistent with prescribed medication Bottle Appearance: Standard pharmacy container. Clearly labeled. Filled Date: 71 / 64 / 2025 Last Medication intake:  TodaySafety precautions to be maintained throughout the outpatient stay will include: orient to surroundings, keep bed in low position, maintain call bell within reach at all times, provide assistance with transfer out of bed and ambulation.

## 2024-09-15 NOTE — Progress Notes (Addendum)
 Patient: [name]  Service: E/M   PCP: Moishe Suzen LABOR, NP (Inactive)  DOB: 1961-01-26  DOS: 09/15/2024  Provider: Emmy MARLA Blanch, NP  MRN: [id number]  Delivery: Face-to-face  Specialty: Interventional Pain Management  Type: Established Patient  Setting: Ambulatory outpatient facility  Specialty designation: 09  Referring Prov.: No ref. provider found  Location: Outpatient office facility       History of present illness (HPI) [name], a 63 y.o. year old female, is here today because of her Right rotator cuff tear arthropathy [M75.101, M12.811]. Ms. Inch primary complain today is Shoulder Pain (right)  Pertinent problems: [name] has  Obesity; Arthritis of wrist; Chronic pain syndrome; Lumbar spondylosis; Bilateral primary osteoarthritis of knee; Bilateral hip pain; Chronic SI joint pain; Lumbar radiculopathy; Right rotator cuff tear arthropathy; Long-term current use of opioid analgesics; Fibromyalgia; and Cervical radicular pain (Left) on the pertinent problem list  Pain Assessment: Severity of Chronic pain is reported as a 9 /10. Location: Shoulder Right/pain radiaties down right arm and hand. Onset: More than a month ago. Quality: Aching, Burning, Throbbing, Discomfort. Timing: Constant. Modifying factor(s): meds. Vitals:  height is 5' 8 (1.727 m) and weight is 205 lb (93 kg). Her temperature is 97.2 F (36.2 C) (abnormal). Her blood pressure is 149/101 (abnormal) and her pulse is 81. Her oxygen saturation is 100%.  BMI: Estimated body mass index is 31.17 kg/m as calculated from the following:   Height as of this encounter: 5' 8 (1.727 m).   Weight as of this encounter: 205 lb (93 kg).  Last encounter: 06/25/2024. Last procedure: 05/06/2024  Reason for encounter: evaluation for possible interventional PM therapy/treatment and medication management. No change in medical history since last visit.  Patient's pain is at baseline.  Patient continues multimodal pain  regimen as prescribed.  States that it provides pain relief and improvement in functional status.   The patient continues to experience chronic right shoulder pain, radiating to the anterior aspect of the shoulder and extending down underneath the arm.  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  However, her pain has since returned and has become bothersome again.  The patient expressed interest in receiving another shoulder injection due to worsening right shoulder pain radiating underneath the right arm.  She does not have any updated shoulder imaging.  We discussed obtaining a repeat right shoulder x-ray and proceeding with another shoulder injection for pain management. Pharmacotherapy Assessment   Hydrocodone -acetaminophen  (Norco) 7.5-325 mg tablet every 8 hours as needed for moderate pain. MME=22.50 Gabapentin  (600 mg total) by mouth at bedtime. 300 mg q day, 600 mg at bedtime  Monitoring: [place] PMP: PDMP reviewed during this encounter.       Pharmacotherapy: No side-effects or adverse reactions reported. Compliance: No problems identified. Effectiveness: Clinically acceptable.  Delores Dorothe LABOR, RN  09/15/2024  9:08 AM  Sign when Signing Visit Nursing Pain Medication Assessment:  Safety precautions to be maintained throughout the outpatient stay will include: orient to surroundings, keep bed in low position, maintain call bell within reach at all times, provide assistance with transfer out of bed and ambulation.  Medication Inspection Compliance: Pill count conducted under aseptic conditions, in front of the patient. Neither the pills nor the bottle was removed from the patient's sight at any time. Once count was completed pills were immediately returned to the patient in their  original bottle.  Medication: Hydrocodone /APAP Pill/Patch Count: 53 of 90  pills/patches remain Pill/Patch Appearance: Markings consistent with prescribed medication Bottle Appearance: Standard pharmacy container. Clearly labeled. Filled Date: 80 / 37 / 2025 Last Medication intake:  TodaySafety precautions to be maintained throughout the outpatient stay will include: orient to surroundings, keep bed in low position, maintain call bell within reach at all times, provide assistance with transfer out of bed and ambulation.     UDS:  Summary  Date Value Ref Range Status  09/26/2023 Note  Final    Comment:    ==================================================================== [employer] Select 13 (MW) ==================================================================== Test                             Result       Flag       Units  Drug Present and Declared for Prescription Verification   Hydrocodone                     525          EXPECTED   ng/mg creat   Hydromorphone                  120          EXPECTED   ng/mg creat   Dihydrocodeine                 102          EXPECTED   ng/mg creat   Norhydrocodone                 1270         EXPECTED   ng/mg creat    Sources of hydrocodone  include scheduled prescription medications.    Hydromorphone, dihydrocodeine and norhydrocodone are expected    metabolites of hydrocodone . Hydromorphone and dihydrocodeine are    also available as scheduled prescription medications.  ==================================================================== Test                      Result    Flag   Units      Ref Range   Creatinine              125              mg/dL      >=79 ==================================================================== Declared Medications:  The flagging and interpretation on this report are based on the  following declared medications.  Unexpected results may arise from  inaccuracies in the declared medications.   **Note: The testing scope of this panel includes these medications:   Hydrocodone  (Norco)    **Note: The testing scope of this panel does not include the  following reported medications:   Acetaminophen  (Norco)  Gabapentin   Melatonin  Multivitamin  Valacyclovir  (Valtrex ) ==================================================================== For clinical consultation, please call [phone]. ====================================================================     No results found for: CBDTHCR No results found for: D8THCCBX No results found for: D9THCCBX  ROS  Constitutional: Denies any fever or chills Gastrointestinal: No reported hemesis, hematochezia, vomiting, or acute GI distress Musculoskeletal: Right shoulder pain (Radiate to anterior aspect of shoulder down to underneath right arm) Neurological: No reported episodes of acute onset apraxia, aphasia, dysarthria, agnosia, amnesia, paralysis, loss of coordination, or loss of consciousness  Medication Review  DULoxetine , HYDROcodone -acetaminophen , Melatonin, Turmeric, gabapentin , magnesium oxide, multivitamin, omeprazole , and valACYclovir   History Review  Allergy: [name] has no known allergies. Drug: [name]  reports no history of drug use. Alcohol:  reports that she does not currently use alcohol. Tobacco:  reports that she quit smoking about 21 years ago. Her smoking use included cigarettes. She has never used smokeless tobacco. Social: [name]  reports that she quit smoking about 21 years ago. Her smoking use included cigarettes. She has never used smokeless tobacco. She reports that she does not currently use alcohol. She reports that she does not use drugs. Medical:  has a past medical history of Adult BMI 32.0-32.9 kg/sq m (10/28/2020), Arthritis, Arthritis of [facility]) joint of both thumbs (03/19/2023), Arthritis of wrist (07/04/2011), Bilateral hip pain (10/13/2019), Bilateral primary osteoarthritis of knee (10/13/2019), Bilateral wrist pain (09/21/2021), Cervical radicular pain (left)  (12/26/2021), Chronic pain syndrome (01/30/2019), Chronic right shoulder pain (09/27/2020), Chronic SI joint pain (10/13/2019), Degenerative disorder of bone, Dyslipidemia (09/04/2023), Elevated BP without diagnosis of hypertension (09/30/2020), Family history of diabetes mellitus (01/30/2019), Family history of heart disease (01/30/2019), Fever blister (08/21/2023), Fibromyalgia (09/21/2021), Genuine stress incontinence, female (2012), GERD (gastroesophageal reflux disease), History of squamous cell carcinoma of skin (10/28/2020), Hypertension (02/01/2021), Insomnia (01/13/2015), Long-term current use of opiate analgesic (01/02/2018), Lumbar radiculopathy (03/23/2020), Lumbar spondylosis (01/30/2019), Menopause (06/16/2018), Migraines, Obesity, Palpitations (08/21/2023), Prediabetes (02/01/2021), Primary osteoarthritis of right shoulder (09/27/2020), 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 Surgical: [name]  has a past surgical history that includes Laparoscopic hysterectomy (10/23/2010); Midurethral sling (05/02/2011); excision of vulvar lesion (05/02/2011); and Breast biopsy (Left, 10/11/2023). Family: family history includes Arthritis in her maternal grandmother and mother; Cancer in her father and paternal grandfather; Diabetes in her son; Heart attack in her maternal grandfather, mother, and paternal grandmother; Hypertension in her mother.  Laboratory Chemistry Profile   Renal Lab Results  Component Value Date   BUN 9 11/21/2023   CREATININE 0.82 11/21/2023   BCR 11 (L) 11/21/2023   GFR 74.95 02/09/2021   GFRAA 92 06/16/2018   GFRNONAA >60 02/24/2021    Hepatic Lab Results  Component Value Date   AST 22 11/21/2023   ALT 24 11/21/2023   ALBUMIN 4.1 11/21/2023   ALKPHOS 98 11/21/2023    Electrolytes Lab  Results  Component Value Date   NA 139 11/21/2023   K 4.3 11/21/2023   CL 106 11/21/2023   CALCIUM 9.7 11/21/2023   MG 1.8 01/30/2019    Bone Lab Results  Component Value Date   VD25OH 54.0 08/21/2023    Inflammation (CRP: Acute Phase) (ESR: Chronic Phase) No results found for: CRP, ESRSEDRATE, LATICACIDVEN       Note: Above Lab results reviewed.  Recent Imaging Review  DG PAIN CLINIC C-ARM 1-60 MIN NO REPORT Fluoro was used, but no Radiologist interpretation will be provided.  Please refer to NOTES tab for provider progress note. Note: Reviewed        Physical Exam  Vitals: BP (!) 149/101   Pulse 81   Temp (!) 97.2 F (36.2 C)   Ht 5' 8 (1.727 m)   Wt 205 lb (93 kg)   SpO2 100%   BMI 31.17 kg/m  BMI: Estimated body mass index is 31.17 kg/m as calculated from the following:   Height as of this encounter: 5' 8 (1.727 m).   Weight as of this encounter: 205 lb (93 kg). Ideal: Ideal body weight: 63.9 kg (140 lb 14 oz) Adjusted ideal body weight: 75.5 kg (166 lb  8.4 oz) General appearance: Well nourished, well developed, and well hydrated. In no apparent acute distress Mental status: Alert, oriented x 3 (person, place, & time)       Respiratory: No evidence of acute respiratory distress Eyes: PERLA  Musculoskeletal: Decreased range of motion Upper Extremity (UE) Exam      Right  Left  Inspection    Skin color, temperature, and hair growth are WNL. No peripheral edema or cyanosis. No masses, redness, swelling, asymmetry, or associated skin lesions. No contractures.  Skin color, temperature, and hair growth are WNL. No peripheral edema or cyanosis. No masses, redness, swelling, asymmetry, or associated skin lesions. No contractures.          Functional ROM    Pain restricted ROM for shoulder  Unrestricted ROM                  Muscle Tone/Strength    Functionally intact. No obvious neuro-muscular anomalies detected.  Functionally intact. No obvious neuro-muscular  anomalies detected.          Sensory (Neurological)    Arthropathic arthralgia affecting the shoulder  Unimpaired                  Palpation    No palpable anomalies              No palpable anomalies                      Maneuver Shoulder abduction (deltoid/supraspinatus, axillary/supra scapular n,, C5) Elbow flexion (biceps brachial, musculoskeletal n, C5-6) Elbow extension (triceps, radial n, C7) Finger abduction (interossei, ulnar n, T1)    Shoulder abduction (deltoid/supraspinatus, axillary/supra scapular n,, C5) Elbow flexion (biceps brachial, musculoskeletal n, C5-6) Elbow extension (triceps, radial n, C7) Wrist extensors (C6) Finger extensors (C8) Finger abduction (interossei, ulnar n, T1)            Provocative Test    Phalen's test: deferred Tinel's test: deferred Apley's scratch test (touch opposite shoulder):  Action 1 (Across chest): Decreased ROM Action 2 (Overhead): Decreased ROM Action 3 (LB reach): Decreased ROM  Phalen's test: deferred Tinel's test: deferred Apley's scratch test (touch opposite shoulder):  Action 1 (Across chest): Adequate ROM Action 2 (Overhead): Adequate ROM Action 3 (LB reach): Adequate ROM             Level  Myotome  Dermatome  Sclerotome  ROM  C5  Elbow flexion  Lateral upper arm      C6  Wrist extension  Thumb and index      C7  Elbow extension  Middle finger      C8  Finger extension  Ring and pinky finger      T1  Finger abduction  Medial elbow and axilla  Cervical Spine Exam  Skin & Axial Inspection: No masses, redness, edema, swelling, or associated skin lesions Alignment: Symmetrical Functional ROM: Pain restricted ROM      Stability: No instability detected Muscle Tone/Strength: Functionally intact. No obvious neuro-muscular anomalies detected. Sensory (Neurological):  Unimpaired Palpation: No palpable anomalies             Assessment   Diagnosis Status  1. Right rotator cuff tear arthropathy   2. Chronic right shoulder pain   3. Primary osteoarthritis of right shoulder   4. Chronic pain syndrome   5. Lumbar spondylosis   6. Fibromyalgia   7. Medication management    Having a Flare-up Having a Flare-up Having a Flare-up   Updated Problems: Problem  Medication Management    Plan of Care  Problem-specific:  Assessment and Plan Chronic right shoulder pain:  The patient reports reoccurrence of chronic right shoulder pain, now radiating further underneath the right arm and associated with sharp, shooting pain and discomfort.  She expressed preference for a repeat shoulder injection, as previous injections provided significant pain relief and functional improvement.  The patient has not had any updated right shoulder x-ray since 2021.  We discussed the plan, and the right shoulder x-rays were ordered.  Right shoulder injection has been scheduled in 2 weeks with Dr. Marcelino.  Chronic pain syndrome: Patient is well managed with Hydrocodone -acetaminophen  and gabapentin , will continue on current medication regimen. Routine UDS ordered today.  Current doses are effective.  Prescribing drug monitoring (PMP) reviewed; findings consistent with the use of prescribed medication and no evidence of narcotic misuse or abuse.  Primary osteoarthritis of right shoulder: The patient has chronic right shoulder pain associated with arthritis.  I recommended shoulder exercises such as cross arm stretch, rolling the shoulders forward and backward and circles to improve mobility and relieve tension and arm strength stretching to help improve range of motion.  The patient was advised to perform this exercise daily, preferably after warm shower, for at least 5 to 10 minutes each morning.  Plan: (Clinic): (R) Shoulder Injection with Dr. Marcelino   Schedule (F2F) (MM) in 90 days  with Emmy Blanch NP.  [name] has a current medication list which includes the following long-term medication(s): duloxetine  and gabapentin .  Pharmacotherapy (Medications Ordered): Meds ordered this encounter  Medications   HYDROcodone -acetaminophen  (NORCO) 7.5-325 MG tablet    Sig: Take 1 tablet by mouth every 8 (eight) hours as needed for moderate pain (pain score 4-6).    Dispense:  90 tablet    Refill:  0   HYDROcodone -acetaminophen  (NORCO) 7.5-325 MG tablet    Sig: Take 1 tablet by mouth every 8 (eight) hours as needed for moderate pain (pain score 4-6).    Dispense:  90 tablet    Refill:  0   HYDROcodone -acetaminophen  (NORCO) 7.5-325 MG tablet    Sig: Take 1 tablet by mouth every 8 (eight) hours as needed for moderate pain (pain score 4-6).    Dispense:  90 tablet    Refill:  0   gabapentin  (NEURONTIN ) 600 MG tablet    Sig: Take 1 tablet (600 mg total) by mouth at bedtime. 300 mg qday, 600 mg qhs    Dispense:  30 tablet    Refill:  5   Orders:  Orders Placed This Encounter  Procedures   SHOULDER INJECTION    Standing Status:   Future    Expiration Date:   12/15/2024    Scheduling Instructions:  Procedure: Intra-articular shoulder (Glenohumeral) joint and (AC) Acromioclavicular joint injection     Side: Right-sided     Level: Glenohumeral joint and (AC) Acromioclavicular joint     Sedation: Patient's choice.     Timeframe: As permitted by the schedule    Where will this procedure be performed?:   [facility]   DG Shoulder Right    Please make sure that the patient understands that this needs to be done as soon as possible. Never have the patient do the imaging just before the next appointment. Inform patient that having the imaging done within the [facility] will expedite the availability of the results and will provide imaging availability to the requesting physician. In addition inform the patient that the imaging order has an expiration date  and will not be renewed if not done within the active period.    Standing Status:   Future    Number of Occurrences:   1    Expiration Date:   12/15/2024    Scheduling Instructions:     Imaging must be done as soon as possible. Inform patient that order will expire within 30 days and I will not renew it.    Reason for Exam (SYMPTOM  OR DIAGNOSIS REQUIRED):   Right shoulder pain    Preferred imaging location?:   [facility]    Call Results- Best Contact Number?:   [phone] [facility]    Release to patient:   Immediate   [employer] Select 13 (MW), Urine    Volume: 30 ml(s). Minimum 3 ml of urine is needed. Document temperature of fresh sample. Indications: Long term (current) use of opiate analgesic (S20.108)    Release to patient:   Immediate        Return in about 2 weeks (around 09/29/2024) for [facility]): (R) Shoulder Injection with Dr. Marcelino .    Recent Visits Date Type Provider Dept  06/25/24 Office Visit Otilia Kareem K, NP [facility]  Showing recent visits within past 90 days and meeting all other requirements Today's Visits Date Type Provider Dept  09/15/24 Office Visit Maui Britten K, NP [facility]  Showing today's visits and meeting all other requirements Future Appointments Date Type Provider Dept  09/28/24 Appointment Marcelino Nurse, MD [facility]  12/07/24 Appointment Chaquana Nichols K, NP [facility]  Showing future appointments within next 90 days and meeting all other requirements  I discussed the assessment and treatment plan with the patient. The patient was provided an opportunity to ask questions and all were answered. The patient agreed with the plan and demonstrated an understanding of the instructions.  Patient advised to call back or seek an in-person evaluation if the symptoms or condition worsens.  I personally spent a total of 30 minutes in the care of  the patient today including preparing to see the patient, getting/reviewing separately obtained history, performing a medically appropriate exam/evaluation, counseling and educating, placing orders, referring and communicating with other health care professionals, documenting clinical information in the EHR, independently interpreting results, communicating results, and coordinating care.  Note by: Emmy MARLA Blanch, NP  Date: 09/15/2024; Time: 10:54 AM

## 2024-09-15 NOTE — Patient Instructions (Signed)
 ______________________________________________________________________    Opioid Pain Medication Update  To: All patients taking opioid pain medications. (I.e.: hydrocodone , hydromorphone, oxycodone, oxymorphone, morphine, codeine, methadone, tapentadol, tramadol, buprenorphine, fentanyl , etc.)  Re: Updated review of side effects and adverse reactions of opioid analgesics, as well as new information about long term effects of this class of medications.  Direct risks of long-term opioid therapy are not limited to opioid addiction and overdose. Potential medical risks include serious fractures, breathing problems during sleep, hyperalgesia, immunosuppression, chronic constipation, bowel obstruction, myocardial infarction, and tooth decay secondary to xerostomia.  Unpredictable adverse effects that can occur even if you take your medication correctly: Cognitive impairment, respiratory depression, and death. Most people think that if they take their medication correctly, and as instructed, that they will be safe. Nothing could be farther from the truth. In reality, a significant amount of recorded deaths associated with the use of opioids has occurred in individuals that had taken the medication for a long time, and were taking their medication correctly. The following are examples of how this can happen: Patient taking his/her medication for a long time, as instructed, without any side effects, is given a certain antibiotic or another unrelated medication, which in turn triggers a Drug-to-drug interaction leading to disorientation, cognitive impairment, impaired reflexes, respiratory depression or an untoward event leading to serious bodily harm or injury, including death.  Patient taking his/her medication for a long time, as instructed, without any side effects, develops an acute impairment of liver and/or kidney function. This will lead to a rapid inability of the body to breakdown and eliminate  their pain medication, which will result in effects similar to an overdose, but with the same medicine and dose that they had always taken. This again may lead to disorientation, cognitive impairment, impaired reflexes, respiratory depression or an untoward event leading to serious bodily harm or injury, including death.  A similar problem will occur with patients as they grow older and their liver and kidney function begins to decrease as part of the aging process.  Background information: Historically, the original case for using long-term opioid therapy to treat chronic noncancer pain was based on safety assumptions that subsequent experience has called into question. In 1996, the American Pain Society and the American Academy of Pain Medicine issued a consensus statement supporting long-term opioid therapy. This statement acknowledged the dangers of opioid prescribing but concluded that the risk for addiction was low; respiratory depression induced by opioids was short-lived, occurred mainly in opioid-naive patients, and was antagonized by pain; tolerance was not a common problem; and efforts to control diversion should not constrain opioid prescribing. This has now proven to be wrong. Experience regarding the risks for opioid addiction, misuse, and overdose in community practice has failed to support these assumptions.  According to the Centers for Disease Control and Prevention, fatal overdoses involving opioid analgesics have increased sharply over the past decade. Currently, more than 96,700 people die from drug overdoses every year. Opioids are a factor in 7 out of every 10 overdose deaths. Deaths from drug overdose have surpassed motor vehicle accidents as the leading cause of death for individuals between the ages of 68 and 39.  Clinical data suggest that neuroendocrine dysfunction may be very common in both men and women, potentially causing hypogonadism, erectile dysfunction, infertility,  decreased libido, osteoporosis, and depression. Recent studies linked higher opioid dose to increased opioid-related mortality. Controlled observational studies reported that long-term opioid therapy may be associated with increased risk for cardiovascular events. Subsequent  meta-analysis concluded that the safety of long-term opioid therapy in elderly patients has not been proven.   Side Effects and adverse reactions: Common side effects: Drowsiness (sedation). Dizziness. Nausea and vomiting. Constipation. Physical dependence -- Dependence often manifests with withdrawal symptoms when opioids are discontinued or decreased. Tolerance -- As you take repeated doses of opioids, you require increased medication to experience the same effect of pain relief. Respiratory depression -- This can occur in healthy people, especially with higher doses. However, people with COPD, asthma or other lung conditions may be even more susceptible to fatal respiratory impairment.  Uncommon side effects: An increased sensitivity to feeling pain and extreme response to pain (hyperalgesia). Chronic use of opioids can lead to this. Delayed gastric emptying (the process by which the contents of your stomach are moved into your small intestine). Muscle rigidity. Immune system and hormonal dysfunction. Quick, involuntary muscle jerks (myoclonus). Arrhythmia. Itchy skin (pruritus). Dry mouth (xerostomia).  Long-term side effects: Chronic constipation. Sleep-disordered breathing (SDB). Increased risk of bone fractures. Hypothalamic-pituitary-adrenal dysregulation. Increased risk of overdose.  RISKS: Respiratory depression and death: Opioids increase the risk of respiratory depression and death.  Drug-to-drug interactions: Opioids are relatively contraindicated in combination with benzodiazepines, sleep inducers, and other central nervous system depressants. Other classes of medications (i.e.: certain antibiotics  and even over-the-counter medications) may also trigger or induce respiratory depression in some patients.  Medical conditions: Patients with pre-existing respiratory problems are at higher risk of respiratory failure and/or depression when in combination with opioid analgesics. Opioids are relatively contraindicated in some medical conditions such as central sleep apnea.   Fractures and Falls:  Opioids increase the risk and incidence of falls. This is of particular importance in elderly patients.  Endocrine System:  Long-term administration is associated with endocrine abnormalities (endocrinopathies). (Also known as Opioid-induced Endocrinopathy) Influences on both the hypothalamic-pituitary-adrenal axis?and the hypothalamic-pituitary-gonadal axis have been demonstrated with consequent hypogonadism and adrenal insufficiency in both sexes. Hypogonadism and decreased levels of dehydroepiandrosterone sulfate have been reported in men and women. Endocrine effects include: Amenorrhoea in women (abnormal absence of menstruation) Reduced libido in both sexes Decreased sexual function Erectile dysfunction in men Hypogonadisms (decreased testicular function with shrinkage of testicles) Infertility Depression and fatigue Loss of muscle mass Anxiety Depression Immune suppression Hyperalgesia Weight gain Anemia Osteoporosis Patients (particularly women of childbearing age) should avoid opioids. There is insufficient evidence to recommend routine monitoring of asymptomatic patients taking opioids in the long-term for hormonal deficiencies.  Immune System: Human studies have demonstrated that opioids have an immunomodulating effect. These effects are mediated via opioid receptors both on immune effector cells and in the central nervous system. Opioids have been demonstrated to have adverse effects on antimicrobial response and anti-tumour surveillance. Buprenorphine has been demonstrated to have  no impact on immune function.  Opioid Induced Hyperalgesia: Human studies have demonstrated that prolonged use of opioids can lead to a state of abnormal pain sensitivity, sometimes called opioid induced hyperalgesia (OIH). Opioid induced hyperalgesia is not usually seen in the absence of tolerance to opioid analgesia. Clinically, hyperalgesia may be diagnosed if the patient on long-term opioid therapy presents with increased pain. This might be qualitatively and anatomically distinct from pain related to disease progression or to breakthrough pain resulting from development of opioid tolerance. Pain associated with hyperalgesia tends to be more diffuse than the pre-existing pain and less defined in quality. Management of opioid induced hyperalgesia requires opioid dose reduction.  Cancer: Chronic opioid therapy has been associated with an increased risk  of cancer among noncancer patients with chronic pain. This association was more evident in chronic strong opioid users. Chronic opioid consumption causes significant pathological changes in the small intestine and colon. Epidemiological studies have found that there is a link between opium dependence and initiation of gastrointestinal cancers. Cancer is the second leading cause of death after cardiovascular disease. Chronic use of opioids can cause multiple conditions such as GERD, immunosuppression and renal damage as well as carcinogenic effects, which are associated with the incidence of cancers.   Mortality: Long-term opioid use has been associated with increased mortality among patients with chronic non-cancer pain (CNCP).  Prescription of long-acting opioids for chronic noncancer pain was associated with a significantly increased risk of all-cause mortality, including deaths from causes other than overdose.  Reference: Von Korff M, Kolodny A, Deyo RA, Chou R. Long-term opioid therapy reconsidered. Ann Intern Med. 2011 Sep 6;155(5):325-8. doi:  10.7326/[id number]. PMID: 78106373; PMCID: [id number]. Kit JINNY Laurence CINDERELLA Pearley JINNY, Hayward RA, Dunn KM, [place] KP. Risk of adverse events in patients prescribed long-term opioids: A cohort study in the [employer]. Eur J Pain. 2019 May;23(5):908-922. doi: 10.1002/ejp.1357. Epub 2019 Jan 31. PMID: 69379883. Colameco S, Coren JS, Ciervo CA. Continuous opioid treatment for chronic noncancer pain: a time for moderation in prescribing. Postgrad Med. 2009 Jul;121(4):61-6. doi: 10.3810/pgm.2009.07.2032. PMID: 80358728. Gigi JONELLE Shlomo MILUS Levern IVER Conny RN, Goldthwaite SD, Blazina I, Lonell DASEN, Bougatsos C, Deyo RA. The effectiveness and risks of long-term opioid therapy for chronic pain: a systematic review for a [employer] of [employer] Pathways to [employer]. Ann Intern Med. 2015 Feb 17;162(4):276-86. doi: 10.7326/M14-2559. PMID: 74418742. Rory CHRISTELLA Laurence [facility], [place] DM. NCHS Data Brief No. 22. [place]: Centers for Disease Control and Prevention; 2009. Sep, Increase in Fatal Poisonings Involving Opioid Analgesics in the [place] , 1999-2006. Song IA, Choi HR, Oh TK. Long-term opioid use and mortality in patients with chronic non-cancer pain: Ten-year follow-up study in [place] from 2010 through 2019. EClinicalMedicine. 2022 Jul 18;51:101558. doi: 10.1016/j.eclinm.[id number]. PMID: 64124182; PMCID: [id number]. Huser, W., Schubert, T., Vogelmann, T. et al. All-cause mortality in patients with long-term opioid therapy compared with non-opioid analgesics for chronic non-cancer pain: a database study. BMC Med 18, 162 (2020). [email or web address].[email or web address] Rashidian H, Zendehd ______________________________________________________________________    Update on Controlled Substance (Opioid) Regulations   To: All patients taking opioid pain medications. (I.e.: hydrocodone , hydromorphone, oxycodone, oxymorphone, morphine, codeine,  methadone, tapentadol, tramadol, buprenorphine, fentanyl , etc.)  Re: Review on the state of controlled substance regulations.  Introduction: Rules and regulations associated with all aspects of controlled substances are constantly being modified. Unfortunately we have encountered patients questioning the veracity of the information that we provide them about these changes. This is intended to provide them with appropriate references and a historical review of these changes.  A Brief History: As of October 01, 2016, the [place]  Government declared the opioid epidemic a public health emergency. Prescription drug monitoring programs (PDMPs) and the [facility] All Schedules Prescription Electronic Reporting Act (NASPER). Before 1800, clinicians regarded pain as an existential phenomenon, a consequence of aging. There was no regulation on the use of cocaine and opioids, resulting in widespread marketing and prescribing for many ailments ranging from diarrhea to toothache. The [employer] of [id number], passed in response to the sudden emergence of street heroin abuse as well as iatrogenic morphine dependence, influenced both physician and patient alike to avoid opiates. Patients with unexplained pain in the 1920s were regarded as deluded, malingering, or  abusers, and cancer patients through the 1950s were encouraged to wean themselves off opioids until their lives "could be measured in weeks". Alongside this opioid evolution, the American Pain Society launched their influential "pain as the fifth vital sign" campaign in Oct 21, 1994. Concurrently, pharmaceutical companies introduced new formulations, such as extended release oxycodone (OxyContin). From 10/21/96 to 10/21/2001, OxyContin prescriptions increased from 670,000 to 6.2 million. However, concerns soon began to surface regarding overzealous opioid treatment. It must be noted that pharmaceutical companies contributed significantly to the rise of the opioid epidemic,  receiving considerable reprimands as a consequence. In Oct 21, 2006, as the opioid epidemic began to inflict profound damage, [employer] pleaded guilty to federal charges related to the misbranding of OxyContin. [employer] agreed to pay a total of $634.5 million to resolve Justice Department investigations, as well as a $19.5 million settlement to [address] and the [address] of [place].  In response to the current epidemic, changes in focus to the development of new abuse deterrent opioid formulations at the [place]  Food and Drug Administration (FDA) as well as drafting of new public standards for pain treatment were created at [facility] in 21-Oct-2016. In response to the opioid epidemic, FDA public policy changes were announced in February 2016. Among these new positions were a re-examination of the risk-benefit paradigm for opioids with strict emphasis on the large public health ramifications. The various modified opioids released over the past 20 years, such as tamper-resistant preparation, have had differing levels of success, and are collectively referred to as Risk Evaluation and Mitigation Strategies (REMS). There is also a growing focus on preventing opioid use disorder (OUD) and on offering affected individuals accessible and effective treatment. [place]  government policy reflects these changes and both the Affordable Care Act and the Mental Health Parity and Addiction Equity Act were major steps forward in treating opioid addiction. The Affordable Care Act, which was signed into law in October 21, 2009, with major provisions coming into effect by 2013/10/21.  In the 1990s, the intensified marketing of newly reformulated prescription opioid medications (e.g., OxyContin) and an influential pain advocacy campaign that encouraged greater pain management led to a precipitous rise in opioid use in the [place] . Research from the Centers for Disease Control and Prevention (CDC) shows that prescription opioid sales in the [place]  quadrupled  from 10-21-98 to 10/21/2009. At the same time, opioid misuse and opioid-involved overdose deaths increased (Figure 1). Between 21-Oct-1998 and 10/21/09, the rate of opioidinvolved overdose deaths in the [place]  doubled from 2.9 to 6.8 deaths per 100,000 people. This initial rise in opioid-related deaths is often referred to as the first wave of the recent opioid crisis.  Between October 21, 1998 and 2019-10-22, 565,000 Americans died of opioid-involved overdoses. In turn, federal, state, and local governments responded with various legal and policy efforts to curb opioid misuse and drug-related overdose Deaths.  Recent Congresses have enacted several laws addressing the opioid crisis, such as the Comprehensive Addiction and Recovery Act of 22-Oct-2015 (CARA, P.L. [phone]); the 21st Century Cures Act (P.L. 114-255); the Substance UseDisorder Prevention that Promotes Opioid Recovery and Treatment for Patients and Communities Act (SUPPORT Act, P.L. [phone]); the Fentanyl  Sanctions Act (Title LXXII of P.L. [id number]); and the Blocking Deadly Fentanyl  Imports Act (P.L. 117-81, 6610). These laws addressed overprescribing and misuse of opioids, expanded substance use disorder prevention and treatment capacities, bolstered drug diversion capabilities, and enhanced international drug interdiction, counternarcotics cooperation, and sanctions efforts. Congress also directed additional funds to many of these initiatives through appropriations.  Congress provided funding  in the [employer] Act of 2021 [phone]; P.L. 117-2) for syringe services programs (often known as needle exchange programs) and other harm reduction initiatives. Federal and state harm reduction strategies have frequently involved the distribution of naloxone (e.g., Narcan)--a medication used to reverse an opioid overdose--and test strips used to detect fentanyl  in drug samples.  The Department of Justice (DOJ) and Department of Homeland Security [facility]) aim to  reduce the diversion of prescription opioids and the use, manufacturing, and trafficking of illicit opioids. DOJ--via the Drug Enforcement Administration (DEA)--regulates opioid manufacturers, distributors, and dispensers; it also controls the opioid supply through enforcement of regulatory requirements.  A History of Opiate Laws in the [place]   Prior to 1890, laws concerning opiates were strictly imposed on a local city or state-by-state basis. One of the first was in [place] in 1875 where it became illegal to smoke opium only in opium dens. It did not ban the sale, import or use otherwise. In the next 25 years different states enacted opium laws ranging from outlawing opium dens altogether to making possession of opium, morphine and heroin without a physician's prescription illegal.  The first Congressional Act took place in 1890 that levied taxes on morphine and opium. From that time on the [employer] has had a series of laws and acts directly aimed at opiate use, abuse and control. These are outlined below:  1906 - Pure Food and Drug Act Preventing the manufacture, sale, or transportation of adulterated or misbranded or poisonous or deleterious foods, drugs, medicines, and liquors, and for regulating traffic therein, and for other purposes. Punishment included fines and prison time.  1909   - Smoking Opium Exclusion Act Banned the importation, possession and use of smoking opium. Did not regulate opium-based medications. First Freight forwarder banning the non-medical use of a substance.  1914  - The Margrette Act In summary, The Margrette Act of 1914 was written more to have all parties involved in importing, exporting, Set designer and distributing opium or cocaine to register with the [employer] and have taxes levied upon them. Exempt from the law were physicians operating "in the course of his professional practice"  1919 - Supreme Court ratified the [employer] in [place] v. [place]  and [place]  v. [place], then again in [facility] v. [place] , in 1920, holding that doctors may not prescribe maintenance supplies of narcotics to people addicted to narcotics. However, it does not prohibit doctors from prescribing narcotics to wean a patient off of the drug. It was also the opinion of the court that prescribing narcotics to habitual users was not considered "professional practice" hence it then was considered illegal for doctors to prescribe opioids for the purposes of maintaining an addiction. It can be argued that today's addiction medications are not intended to maintain an addiction but to facilitate addiction remission. In which case, this opinion of the court should not preclude practitioners from prescribing buprenorphine or methadone to patients suffering from an addictive disorder.  1924  - Heroin Act Architectural technologist, importation and possession of heroin illegal - even for medicinal use.  1922 -- Narcotic Drug Import and Export Act Enacted to assure proper control of importation, sale, possession, production and consumption of narcotics.  1927  -- Special educational needs teacher of Prohibition [employer] of Prohibition was responsible for tracking bootleggers and organized Conservation officer, historic buildings. They focused primarily on interstate and international cases and those cases where local law enforcement official would not or could  not act.  1932 -- Uniform State Narcotic Act Encouraged states to pass uniform state laws matching the federal Narcotic Drug Import and Export Act. Suggested prohibiting cannabis use at the state level.  37 -- Food, Drug, and Cosmetic Act The new law brought cosmetics and medical devices under control, and it required that drugs be labeled with adequate directions for safe use. Moreover, it mandated pre-market approval of all new drugs, such that a manufacturer would have to prove to FDA that a drug were safe  before it could be sold  1951 -- Boggs Act Imposed maximum criminal penalties for violations of the import/export and internal revenue laws related to drugs and also established mandatory minimum prison sentences.  1956 -- Narcotics Control Act Increased Boggs Act penalties and mandatory prison sentence minimums for violations of existing drug laws.  1965 -- Drug Abuse Control Amendment Enacted to deal with problems caused by abuse of depressants, stimulants and hallucinogens. Restricted research into psychoactive drugs such as LSD by requiring FDA approval.  1970 -- Controlled Substance Act  Controlled Substances Import and Export Act These laws are a consolidation of numerous laws regulating the manufacture and distribution of narcotics, stimulants, depressants, hallucinogens, anabolic steroids, and chemicals used in the illicit production of controlled substances. The CSA places all substances that are regulated under existing federal law into one of five schedules. This placement is based upon the substance's medicinal value, harmfulness, and potential for abuse or addiction. Schedule I is reserved for the most dangerous drugs that have no recognized medical use, while Schedule V is the classification used for the least dangerous drugs. The act also provides a mechanism for substances to be controlled, added to a schedule, decontrolled, removed from control, rescheduled, or transferred from one schedule to another.  44 - Drug Enforcement Agency By Executive Order, the DEA was formed to take place of the [employer] of Narcotics and Dangerous Drugs.  53 -Narcotic Addict Treatment Act of  1974  - Public Law [phone] Amends the Controlled Substance Act of 1970 to provide for the registration of practitioners conducting narcotic treatment programs. [methadone clinics] It also provides legal definitions for the phrases "maintenance treatment" and "detoxification treatment".  1986 -- Anti-Drug Abuse  Act of 1986 Strengthened Federal efforts to encourage foreign cooperation in eradicating illicit drug crops and in halting international drug traffic, to improve enforcement of Federal drug laws and enhance interdiction of illicit drug shipments, to provide strong Market researcher in establishing effective drug abuse prevention and education programs, to [employer] support for drug abuse treatment and rehabilitation efforts, and for other purposes. It also re-imposed mandatory sentencing minimums depending on which drug and how much was involved.  1988 -- Anti-Drug Abuse Act of 1988 Established the Office of Materials engineer (ONDCP) in the [employer] of the Economist; authorized funds for [employer], state and local drug enforcement activities, school-based drug prevention efforts, and drug abuse treatment with special emphasis on injecting drug abusers at high risk for AIDS.  2000 -- Federal - The Drug Addiction Treatment Act of 2000 (DATA 2000) It enables qualified physicians to prescribe and/or dispense narcotics for the purpose of treating opioid dependency. For the first time, physicians are able to treat this disease from their private offices or other clinical settings. This presents a very desirable treatment option for those who are unwilling or unable to seek help in drug treatment clinics. Patients can now be treated in the privacy of their doctor's office, as are other  people being treated for any other type of medical condition. One medicine doctors may now prescribe is Buprenorphine. The major downfall of this Act is the limitation of 30 patients per practice - which means that large facilities, no matter how many physicians are there, can only treat 30 patients at a time.  2002-- DEA reschedules buprenorphine from a schedule V drug to a schedule III drug, on September 22, 2001 - the day before the FDA approval of Suboxone and Subutex despite overwhelming objection by the  medical community.  2004: June 2004 THE CONFIDENTIALITY OF ALCOHOL AND DRUG ABUSE PATIENT RECORDS REGULATION AND THE HIPAA PRIVACY RULE:  Confidentiality of Alcohol and Drug Dependence Patient Records (summary) Code of Federal Regulations Title 42 Part 2 (42 CFR Part 2)  The confidentiality of alcohol and drug dependence patient records maintained by this practice/program is protected by federal law and regulations. Generally, the practice/program may not say to a person outside the practice/program that a patient attends the practice/program, or disclose any information identifying a patient as being alcohol or drug dependent unless:  The patient consents in writing; The disclosure is allowed by a court order, or The disclosure is made to medical personnel in a medical emergency or to qualified personnel for research,  audit, or practice/program evaluation. Violation of the federal law and regulations by a practice/program is a crime. Suspected violations may be reported to appropriate authorities in accordance with federal regulations. Freight forwarder and regulations do not protect any information about a crime committed by a patient either at the practice/program or against any person who works for the practice/program or about any threat to commit such a crime. Federal laws and regulations do not protect any information about suspected child abuse or neglect from being reported under state law to appropriate state or local authorities.  sample consent form (MS-WORD)  2005: 07-18-2004 Public law [phone], Amends the Controlled Substances Act to eliminate the 30-patient limit for medical group practices allowed to dispense narcotic drugs in schedules III, IV, or V for maintenance or detoxification treatment (retains the 30-patient limit for an individual physician). This amendment removes the 30-patient limit on group medical practices that treat opioid dependence with buprenorphine. The restriction was  part of the original Drug Addiction Treatment Act of 2000 (DATA) that allowed treatment of opioid dependence in a doctor's office. With this change, every certified doctor may now prescribe buprenorphine up to his or her individual physician limit of 30 patients.  2006: On 12/14/2005 President Levy signed Bill H.R.6344 into law. This allows physicians who have been certified to prescribe certain drugs for the treatment of opioid dependence under DATA2000 to treat up to 100 patients (up from 30) by submitting an intent notification to the Dept of Health and [employer]. This is a major step forward in both fighting the stigma and allowing access to treatment previously not available to some. For more details see 30/100-PATIENT LIMIT  2016: HHS augments regulations concerning the 30/100 patient limit by raising the limit to 275 for qualifying physicians. Link to summary of regulation  2016: Comprehensive Addiction and Recovery Act of 2016 (sec.303) amends the Controlled Substance Act - to allow Nurse Practitioners and Physician Assistants to become eligible to prescribe buprenorphine for the treatment of opioid use disorder. See the entire law for more details.  The roots of the concurrent regulation of certain drugs under two statutory schemes go back to the beginning of this century. In 1906, Congress enacted the [employer] and  Drug Act, establishing one regime of regulation to assure (among other things) that drugs were not adulterated or misbranded. These regulations were amended several times, recodified in 1938, and expanded on again from the 1940s through the 1990s. Their implementation and enforcement is today assigned to the Food and Drug Administration (FDA) in the Department of Health and Human Services [facility]).  In 1914, Congress adopted the [place] Narcotic Act to stop abuse of addictive drugs. The [place] Narcotic Act was amended in 1937 to include marijuana. In 1965, amphetamines,  barbiturates, and hallucinogens came under regulation, but under the [employer], Drug, and Cosmetic Act. In 1970, these various statutes were consolidated and recodified as the Controlled Substances Act (CSA), which has been amended several times since then. Its implementation and enforcement is today assigned to the Drug Enforcement Administration (DEA) in the Department of Justice.  The first clash occurred after World War I, when so-called morphine clinics existed and physicians prescribed or dispensed morphine to addicts. Some addicts were veterans of the American Civil War, the Spanish-American War, and WWI, who had become addicted during treatment for war wounds, but most of them came from the growing population of nonmedical addicts ([place], 8017). The Narcotics Division of the Prohibition Unit of the Department of the Treasury, which was then responsible for enforcing the [facility] Narcotic Act, concluded that this activity was not the legitimate practice of medicine but simple drug trafficking. The Treasury Department swiftly closed the clinics and made it personally and professionally risky for physicians to maintain a narcotic addict for any reason. In did so, however, only after the American Medical Association had adopted a resolution, in 1920, opposing ambulatory clinics''.  In 1972, the public health establishment, including the Secretary of Health, Education, and Welfare, the Education officer, environmental, the [employer] of [employer], and the Chemical engineer for Drug Abuse Prevention, was unprepared to allow [employer] of Narcotics and Dangerous Drugs, DEA's predecessor agency, to unilaterally define the parameters of medical practice for the use of methadone in the treatment of heroin addiction. As a consequence, a new set of rules--the third, on top of the FDA and DEA schemes--was added, one that inserted FDA deeply into the practice of medicine, notwithstanding its  protestations to the contrary. Congress ratified this joint responsibility of law enforcement and public health officials for methadone through this third set of rules in 1974 with the passage of the Narcotic Addict Treatment Act (NATA). To examine in detail the evolution of this third set of rules--commonly referred to as the FDA or DHHS methadone regulations--we turn, first, to the period of the mid-1960s.  Increased use of heroin in the post World War II period first became apparent in the early to mid 1950s. During the [employer], a minimum mandatory narcotics law was enacted in 1956, effective July 1957. 1962 [facility] conference on drug abuse, the [employer] on Narcotic and Drug Abuse (the [employer]) of 1963, the Drug Abuse Control Amendments of 1965, the President's Commission on [employer] and Administration of Justice (the [employer]) of [phone], and the Narcotic Addiction Rehabilitation Act of 1966.  The 1965 Drug Abuse Control Amendments brought under strict federal control all nonnarcotic drugs capable of producing serious psychotoxic effects when abused. This act also created the [employer] of Drug Abuse Control within the Department of Health, Education, and Welfare (DHEW) and shifted the basis for [employer] of illegal drugs from tax principles (administered by the Department of  Treasury) to the regulation of commerce (administered by the [employer]).  The 1966 Narcotic Addiction Rehabilitation Act Tour manager) authorized the civil commitment of narcotic addicts, and federal assistance to state and local governments to develop a local system of drug treatment programs. With respect to the latter, the [employer] of Mental Health [facility]) initially proposed the gradual implementation of the state assistance effort, mainly through a common mental health mechanism--inpatient treatment programs. However, because of a perceived  pressing need, the courts began to commit addicts to these programs even before they were officially opened or staffed. The NARA legislation imposed the following contract requirements on treatment centers: (1) thrice-a-week counseling sessions; (2) weekly urine tests; (3) restorative dental services; (4) psychological consultations and vocational training; and (5) the treatment modalities of drug-free outpatient, therapeutic community, and methadone maintenance. Reorganization Plan No. 1 of 1968 transferred the primary functions of the [employer] of Narcotics (FBN) from the [employer] to the Department of Justice; it also transferred the [employer] of Drug Abuse Control functions to the Department of Justice. Within the [employer], the [employer] of Narcotics and Dangerous Drugs (BNDD) was created, which became the Drug Enforcement Administration in 1973.   Under the first Sequim administration [phone]), federal drug abuse policy developed in a significant way. These developments included a 1969 war on drugs presidential message, resulting legislation in 1970, and a Special Action Office created by executive order in 1971 and authorized in statute in 1972. Brynn, in 1969, to send a message to Congress on drug abuse. Although this was the first time that a U.S. president invoked the war on drugs image, it was in retrospect the most balanced approach to the problem of drug abuse that had been advanced. The 1969 message resulted in the submission of legislation to the Congress and the passage, the following year, of the Comprehensive Drug Abuse Prevention and Control Act of 1970 [employer] [phone], October 12, 1969). The act dealt with research, treatment, and prevention of drug abuse and drug dependence, and with drug abuse Charity fundraiser. One major purpose of the 1970 legislation was to reverse some of the strictures of the [employer] of 1914. The 1970 act sought to clarify  for the medical profession . . . the extent to which they may safely go in treating narcotic addicts as patients. Title I, in Section IV, charged the Surveyor, minerals, Education, and Welfare, to determine the appropriate methods of professional practice in the medical treatment of the narcotic addiction of various classes of narcotic addicts. This provision constitutes the initial statutory basis for treatment standards. The law enforcement sections consolidated all prior federal statutes into the Controlled Substances Act and the Controlled Substances Export and Import Act (Titles II and III, respectively, of the Comprehensive Drug Abuse Prevention and Control Act of 1970). Under this legislation, substances were classified under five schedules according to their abuse potential, and psychological and physical effects. Methadone was placed in Schedule II, along with such opiate drugs as morphine, codeine, and hydrocodone .  One of the most important steps taken by President Brynn was to establish in June 1971 the Special Action Office for Drug Abuse Prevention (SAODAP) in the [employer] of the President ([place] [phone], June 02, 1970). In mid-1971, [facility] appointed Dr. Maple Dunnings as SAODAP director. Within a year, the Drug Abuse Prevention Office and Treatment Act of 1972 [employer] [phone], March 07, 1971) gave statutory authority to [facility], but limiting setting, on June 15, 1974, as the limit on its existence.  The purpose of the 1972 act was to bring the resources of the federal government to bear on drug abuse with the immediate objective of significantly reducing its incidence and developing a comprehensive, coordinated long-term federal strategy to combat drug abuse.  Narcotic Addict Treatment Act [employer]) of 1974 [employer] [phone]), which amended the Controlled Substances Act. This legislation was driven by concern for the diversion of methadone to illicit channels that was  occurring in 1972 and 1973, as reflected in the title of the Senate bill adopted on May 24, 1972, the Methadone Diversion Control Act of 1973. (U.S. Senate, 1970a, 8029a).  The 1980 final rule (45 FR 37305, September 05, 1979) reduced the minimum standard for admission from two years of addiction to one year coupled with a clinical determination that the individual was currently physiologically.  The regulations were next revised in 1989, following two proposals to modify them, one in 1983 and one in 1987.  Under President Tanda Corrente, a government-wide effort was made to review all federal government regulations and to eliminate or reduce the burden of these regulations on the private sector, state and [employer], and [employer].   The 1983 recommendations, though not adopted, did initiate another revision of the methadone regulations, which first found expression in a 1987 proposed rule (52 FR 37047, September 17, 1986) and culminated in a final rule (54 FR 8954, February 16, 1988) at the end of the decade. In the 1987 proposed rule, the FDA and NIDA, in an effort to put the best face on the unenthusiastic 1983 response by the provider community to converting the regulations to guidelines, indicated that they had retained the current requirements necessary to achieve the goals of the 1974 NATA, but were proposing to streamline the regulation and to promote more efficient operation of methadone programs. The 1987 proposed rule, issued by the FDA and NIDA, advanced the following changes in the methadone regulation: that detoxification treatment be divided into short-term (<21 days) and long-term (>21 and <180 days) treatment; that the minimum staffing ratio of one counselor to 50 patients be eliminated; that blood tests be allowed as ways to conduct initial drug screening or to meet the monthly testing requirements for six-day take-home patients; that the 72-hour notification of FDA and  the pertinent state authority for methadone doses greater than 100 mg be eliminated; that special adverse reaction reporting requirements for methadone be eliminated and reliance placed upon general FDA reporting requirements; that a supervising counselor be allowed to conduct the annual review of the patient's treatment plan for certain qualified patients who had been in treatment for 3 years or longer; and that the requirement of an annual report of methadone treatment programs to the FDA be dropped. The FDA and NIDA issued a final rule on February 16, 1988, based on comments on the 1987 proposed (54 FR 8954). Concurrently, FDA and NIDA issued a six-page guidance document, which noted that the regulations, over time, had recommended certain practices that were not actually required. Public Health Service, in Congress, and elsewhere, to reorganize the Alcohol, Drug Abuse, and Mental Health Administration (ADAMHA). These efforts culminated in the [employer] of 1992 [employer] [phone], June 26, 1991), the main purpose of which was to transfer the research portions of the three ADAMHA institutes--NIDA, the [employer] Mills of Alcoholism and Alcohol Abuse, and the [employer] Mills of Mental Health--to the [employer] and to create the Substance Abuse  and Mental Health Services Administration [facility]) as the home for the service functions of these entitles.  Guidelines for Opioid Treatment The Federal Guidelines for Opioid Treatment Programs - 2015 serve as a guide to accrediting organizations for developing accreditation standards. The guidelines also provide OTPs with information on how programs can achieve and maintain compliance with federal regulations. The 2015 guidelines are an update to the 2007 Guidelines for the Accreditation of Opioid Treatment Programs (PDF  547 KB). The new document reflects the obligation of OTPs to deliver care consistent with the  patient-centered, integrated, and recovery-oriented standards of substance use treatment.  DPT oversees the certification of OTPs and provides guidance to nonprofit organizations and state governmental entities that want to become a SAMHSA-approved accrediting body. Learn more about the accreditation and certification of OTPs and [employer] oversight of OTP accreditation bodies.  Model Guidelines for [facility] Boards With input from [facility], the Federation of [employer] in 2013 adopted a revised version of the federation's office-based opioid treatment policies. The Model Policy on DATA 2000 and Treatment of Opioid Addiction in the Medical Office - 2013 (PDF  279 KB) provides model guidelines for use by state medical boards in regulating office-based opioid treatment.  Holiday Guidance for Opioid Treatment Programs (PDF  203 KB) In response to requests for the upcoming federal holidays and ensuing weekends (December 24th, 25th, and 26th and December 31st, Jan 1st, and Jan 2nd), this letter is to provide guidance regarding requests for unsupervised doses of medication for patients for these dates. View a sample [id number] (PDF  194 KB).  Federal regulation of drugs emerged as early as 45, under a law that addressed only imported drugs. In 1905 the [employer] launched a private, voluntary means of controlling a substantial part of the drug marketplace, a system that remained in place for over a half-century. Drug regulation in FDA has evolved considerably since President Ricardo Para signed the 1906 Pure Food and Drugs Act.  1820 Eleven doctors set up the U.S. Pharmacopeia and record the first list of standard drugs. 1848 Drug Importation Act passed by Congress requires U.S. Customs Service inspection to stop entry of tainted, low quality drugs from overseas. 8116 Dr. Mitchell MICAEL Burrs becomes the chief chemist at the [facility] of [place] adulteration  studies.  1905 The American Medical Association [facility]) begins a voluntary program of drug approval that would last until 1955. In order to advertise in the [facility] and related journals, drug companies must show proof that the drug will treat what they claim. 1906 The original Food and Drug Act is passed by Congress on June 30 and signed by [employer]. The Act outlaws states from buying and selling food, drinks, and drugs that have been mislabeled and tainted. 1911 In U.S. v. Vicci, the [employer] that the [employer] and Drugs Act does not outlaw false medical claims but only false and misleading statements about the ingredients or identity of a drug. 1912 Congress passes the [place] Amendment to overcome the ruling in U.S. v. Vicci. The Act outlaws labeling medicines with fake medical claims that is meant to trick the buyer. 1930 The name of the Food, Drug, and Insecticide Administration is shortened to Food and Drug Administration (FDA) under an Therapist, music. 1933 FDA recommends a total rewrite of the out-of-date 1906 Food and Drugs Act.   1937 Elixir Sulfanilamide, contain the poisonous liquid, diethylene glycol, kills 107 persons, many of whom are children, dramatizing the  need to establish drug safety before marketing and to pass the pending food and drug law. 1938 Congress passes [employer], Drug, and Cosmetic (FDC) Act of 1938, which requires that new drugs show safety before selling. This starts a new system of drug regulation. The Act also requires that safe limits be set for unavoidable poisonous matter and allows for factory inspections. The [employer] is given power to oversee advertising for all FDAregulated products except prescription drugs. FDA states that sulfanilamide and other dangerous drugs must be given under the direction of a medical expert. This begins the requirement for prescription only  (nonnarcotic) drugs (see 1951 [place] amendment). 1941 Nearly 300 deaths and injuries result from the use of sulfathiazole tablets, an antibiotic, tainted with the sedative, phenobarbital. In response, FDA drastically changes manufacturing and quality controls. These changes lead to the development of good manufacturing practices (GMPs). 1948 The [employer] in U.S. v. Floretta that FDA jurisdiction extends to retail stores, thereby allowing FDA to stop illegal sales of drugs by pharmacies including barbiturates and amphetamines. 1950 In Walgreen. v. U.S., a U.S. Court of Appeals rules that the directions for use on a drug label must include the drug's purpose. 1951 Congress passes the [place] Amendment, which defines the kinds of drugs that cannot be used safely without medical supervision. The amendment limits sale of these drugs to prescription only by a medical professional. All other drugs are to be available without a prescription. 1952 A nationwide investigation by FDA reveals that chloramphenicol, an antibiotic, caused nearly 180 cases of often deadly blood diseases. Two years later FDA engages the [employer] of Hospital Pharmacists, the American Association of Medical Record Librarians, and later the American Medical Association in a voluntary program of drug reaction reporting. 1953 The [employer] Amendment clarifies previous law and requires FDA to give manufacturers written reports of conditions seen during inspections and results of factory samples. 1962 Thalidomide, a new sleeping pill, causes severe birth defects of the arms and legs in thousands of babies born in [place]. The U.S. media reports on how Dr. Cathlean Mort, a FDA medical officer, helped prevent approval and marketing of Thalidomide in the [place] . These reports stirred up public support for stronger drug laws. 3 Congress passes the  [employer]. For the first time, these laws require drug makers to prove their drug works before FDA can approve them for sale. The Advisory Committee on Investigational Drugs meets for the first time. This was the first meeting of a committee to advise FDA on product approval and policy on an ongoing basis. 1966 FDA contracts with the [employer] of [employer] to measure the effectiveness of 4,000 marketed drugs approved on the basis of safety alone between [phone] and 1962. The Fair Packaging and Labeling Act requires all consumer products, in interstate commerce, to be honestly and informatively labeled. 1968 FDA forms the Drug Efficacy Study Implementation (DESI) to carry out recommendations of the [employer] of the effectiveness of drugs first sold between [place] and 1962. 1970 FDA requires the first patient package insert, medicines must come with information for the patient about risks and benefits. 1972 Over-the-Counter Drug Review begins to enhance the safety, effectiveness and appropriate labeling of drugs sold without prescription. 1973 The [place]. Supreme Court upholds the [place] drug effectiveness law and approves FDA's action to control entire classes of products. 1982 FDA issues Tamper-resistant Packaging Regulations to prevent poisonings such  as deaths from cyanide placed in Tylenol  capsules. Congress passes the [employer] in 09-Oct-1982, making it a crime to tamper with packaged consumer products. Oct 10, 1983 Drug Price Advertising account planner Act (Hatch-Waxman Act) increases the availability of less costly generic drugs by allowing FDA to approve applications for generic versions of brand-name drugs without repeating the research that proved the safety and effectiveness of the brand-name drugs. The Act also allowed brand-name companies to apply for up to five years additional  patent protection for the new medicines they developed to make up for time lost while their products were going through FDA's approval process. 1989 The FDA issued guidelines asking drug makers to decide if a drug is likely to have usefulness in elderly people and to include elderly people in studies when applicable. 1991 In Oct 09, 1980, the FDA and the Department of Health and Human Services published a policy on protecting people in research. In 10-09-90, this policy is adopted by more than a dozen federal agencies involved in human subject research and becomes known as the Common Rule. 4 1993 FDA launches MedWatch, a system designed to collect reports from health professionals on problems with drugs and other medical products. FDA issues guidelines for measuring gender differences in responses to medication. Drug companies are encouraged to include patients of both sexes in their research of drugs and to study any gender-specific effects. 1995 FDA declares cigarettes to be drug delivery devices. Limits are issued on marketing and sales to reduce smoking by young people. 1998 FDA introduces the Adverse Event Reporting System (AERS), a computerized database designed to store and study safety reports on already marketed drugs.  The Demographic Rule requires that a marketing application review data on safety and effectiveness by age, gender, and race. The Pediatric Rule requires drug makers of selected new and existing drugs to conduct studies on drug safety and effectiveness in children. 1999 Creation of the Drug Facts Label for OTC drug products. The law requires all overthe-counter drug labels to have information in a standard format. These drug facts labels are designed to give the user easy-to-find information. 2000 The [place] [employer], upholds an earlier decision from [employer] and Drug Administration v. [employer]. et al. and rules 5-4 that FDA does not have  authority to regulate tobacco as a drug. 10-09-01 The Best Pharmaceuticals for Children Act, in exchange for studying the drug in children, the drug maker gets six months of selling their product without competition. 10-09-2002 The Pediatric Research Equity Act gives FDA the right to ask drug companies to study the effectiveness of new drugs in children. 10/10/2003 FDA advises medical professionals to limit the use of a pain reliever called Cox-2, a nonsteroidal anti-inflammatory drug (NSAIDs). Studies had shown that long-term use raised chances of heart attacks and strokes. The warning is also added to the over-thecounter NSAIDs' Drug Facts label. Medicines used in hospitals must have a bar code to prevent patients from receiving the wrong medicine. 5 10/09/04 The Drug Safety Board is formed, consisting of FDA staff and representatives from the [employer] of [address] and the [employer]. The Board advises the Director, Center for Drug Evaluation and Research, FDA, on drug safety issues and works with the agency in sharing safety information to health professionals and patients.  The [place]  Food and Drug Administration (FDA) was first created to enforce the Pure Food and Drug Act of 1906. In this capacity, the FDA is charged with protecting the  health of the [place]  public, to ensure the quality of its food, medicine, and cosmetics. Before this time, the [place]  government had no formal oversight of these products and left issues of quality and purity to the individual manufactures, or at times, individual states.    Review: [place] Stop ACT. (The Strengthen Opioid Misuse Prevention (STOP) Act of 2017). GENERAL ASSEMBLY OF [place]  SESSION 2017 SESSION LAW 2017-74 HOUSE BILL 243  PMP mandatory The dispenser shall report: (1) The dispenser's DEA number. (2) The name of the patient for whom the controlled substance is being dispensed, and the patient's: a. Full address,  including city, state, and zip code, b. Telephone number, and c. Date of birth. (3) The date the prescription was written. (4) The date the prescription was filled. (5) The prescription number. (6) Whether the prescription is new or a refill. (7) Metric quantity of the dispensed drug. (8) Estimated days of supply of dispensed drug, if provided to the dispenser. (9) National Drug Code of dispensed drug. (10) Prescriber's DEA number. (11) Method of payment for the prescription.  No paper prescriptions  Duration of scripts Acute vs Chronic prescribing  2016 CDC Guidelines for prescribing Opioids for Chronic Pain. (Updated in 2022.) Medical Board  Laws:  Prescription Laws Drug laws, rules, and regulations are constantly changing. Any attempt to summarize them would quickly become outdated. Because of that, the Board encourages practitioners who seek guidance on prescribing procedures to refer to the sources listed below in addition to the Board's position statements, rules and Medical Practice Act.  [place]  Board of Pharmacy (NCBOP) (which offers the state's pharmacy laws and rules, and links to the Code of Federal Regulations) [employer] Site: www.ncbop.org  [place]  General Statutes General Web Site: PoliticalPool.cz See: [place]  Food, Drug, and Cosmetic Act: S293155 & 106-134 See: [place]  Pharmacy Practice Act, Article 4A: [phone] See: [place]  Controlled Substances Act, Article 5: 90-86 & 90-113.8 See: Use of controlled substances to render one mentally incapacitated or physically helpless: [employer] [facility]. Code, Title 21, Food & Drugs www.deadiversion.usdoj.gov Controlled Substances Schedules www.deadiversion.usdoj.gov Drug Warehouse manager - www.deadiversion.usdoj.gov 42 CFR  8.12 - Federal opioid treatment standards.   Effective August 12, 2016, prior approval will be required for opioid  analgesic doses for [facility]. Medicaid and [facility]) beneficiaries which:  Exceed 120 mg of morphine equivalents (MME) per day  Are greater than a 14-day supply of any opioid, or,  Are non-preferred opioid products on the [place] Medicaid Preferred Drug List (PDL)  FEDERAL 42 CFR  8.12 - Federal opioid treatment standards. Title II of the Comprehensive Drug Abuse Prevention and Control Act of 1970, commonly known as the Controlled Substance Act (CSA) Title 21 [place] ([place] Controlled Substances Act.   Reference:   ______________________________________________________________________       ______________________________________________________________________    Procedure instructions  Stop blood-thinners  Do not eat or drink fluids (other than water) for 6 hours before your procedure  No water for 2 hours before your procedure  Take your blood pressure medicine with a sip of water  Arrive 30 minutes before your appointment  If sedation is planned, bring suitable driver. [place], [place], & public transportation are NOT APPROVED)  Carefully read the Preparing for your procedure detailed instructions  If you have questions call us  at [phone]  Procedure appointments are for procedures only.   NO medication refills or new problem evaluations will be done  on procedure days.   Only the scheduled, pre-approved procedure and side will be done.   ______________________________________________________________________    el K, Kamangar F, Malekzadeh R, Haghdoost AA. An Ecological Study of the Association between Opiate Use and Incidence of Cancers. Addict Health. 2016 Fall;8(4):252-260. PMID: [id number]; PMCID: [id number].  Our Goal: Our goal is to control your pain with means other than the use of opioid pain medications.  Our Recommendation: Talk to your physician about coming off of these medications. We can assist you with the tapering down and  stopping these medicines. Based on the new information, even if you cannot completely stop the medication, a decrease in the dose may be associated with a lesser risk. Ask for other means of controlling the pain. Decrease or eliminate those factors that significantly contribute to your pain such as smoking, obesity, and a diet heavily tilted towards inflammatory nutrients.  Last Updated: 06/24/2023   ______________________________________________________________________

## 2024-09-17 ENCOUNTER — Encounter: Payer: Self-pay | Admitting: Student in an Organized Health Care Education/Training Program

## 2024-09-18 LAB — TOXASSURE SELECT 13 (MW), URINE

## 2024-09-22 ENCOUNTER — Encounter: Admitting: Nurse Practitioner

## 2024-09-24 ENCOUNTER — Encounter: Admitting: Nurse Practitioner

## 2024-09-28 ENCOUNTER — Encounter: Payer: Self-pay | Admitting: Student in an Organized Health Care Education/Training Program

## 2024-09-28 ENCOUNTER — Ambulatory Visit (HOSPITAL_BASED_OUTPATIENT_CLINIC_OR_DEPARTMENT_OTHER): Admitting: Student in an Organized Health Care Education/Training Program

## 2024-09-28 ENCOUNTER — Ambulatory Visit
Admission: RE | Admit: 2024-09-28 | Discharge: 2024-09-28 | Disposition: A | Discharge status: Home / Self Care | Source: Ambulatory Visit | Attending: Student in an Organized Health Care Education/Training Program | Admitting: Student in an Organized Health Care Education/Training Program

## 2024-09-28 VITALS — BP 132/80 | HR 68 | Temp 97.1°F | Resp 17 | Ht 68.0 in | Wt 200.0 lb

## 2024-09-28 DIAGNOSIS — M47816 Spondylosis without myelopathy or radiculopathy, lumbar region: Secondary | ICD-10-CM | POA: Diagnosis present

## 2024-09-28 DIAGNOSIS — M12811 Other specific arthropathies, not elsewhere classified, right shoulder: Secondary | ICD-10-CM | POA: Diagnosis present

## 2024-09-28 DIAGNOSIS — G8929 Other chronic pain: Secondary | ICD-10-CM | POA: Diagnosis present

## 2024-09-28 DIAGNOSIS — M75101 Unspecified rotator cuff tear or rupture of right shoulder, not specified as traumatic: Secondary | ICD-10-CM | POA: Insufficient documentation

## 2024-09-28 DIAGNOSIS — M19011 Primary osteoarthritis, right shoulder: Secondary | ICD-10-CM | POA: Diagnosis present

## 2024-09-28 DIAGNOSIS — M25511 Pain in right shoulder: Secondary | ICD-10-CM

## 2024-09-28 MED ORDER — LIDOCAINE HCL 2 % IJ SOLN
20.0000 mL | Freq: Once | INTRAMUSCULAR | Status: AC
  Administered 2024-09-28: 100 mg
  Filled 2024-09-28: qty 20

## 2024-09-28 MED ORDER — METHYLPREDNISOLONE ACETATE 40 MG/ML IJ SUSP
40.0000 mg | Freq: Once | INTRAMUSCULAR | Status: AC
  Administered 2024-09-28: 40 mg via INTRA_ARTICULAR
  Filled 2024-09-28: qty 1

## 2024-09-28 MED ORDER — IOHEXOL 180 MG/ML  SOLN
10.0000 mL | Freq: Once | INTRAMUSCULAR | Status: AC
  Administered 2024-09-28: 10 mL via INTRA_ARTICULAR
  Filled 2024-09-28: qty 20

## 2024-09-28 MED ORDER — ROPIVACAINE HCL 2 MG/ML IJ SOLN
4.0000 mL | Freq: Once | INTRAMUSCULAR | Status: AC
  Administered 2024-09-28: 4 mL via INTRA_ARTICULAR
  Filled 2024-09-28: qty 20

## 2024-09-28 NOTE — Progress Notes (Signed)
 PROVIDER NOTE: Information contained herein reflects review and annotations entered in association with encounter. Interpretation of such information and data should be left to medically-trained personnel. Information provided to patient can be located elsewhere in the medical record under Patient Instructions. Document created using STT-dictation technology, any transcriptional errors that may result from process are unintentional.    Patient: [name]  Service Category: Procedure  Provider: Wallie Sherry, MD  DOB: Jul 10, 1961  DOS: 09/28/2024  Location: [facility]  MRN: [id number]  Setting: Ambulatory - outpatient  Referring Provider: No ref. provider found  Type: Established Patient  Specialty: Interventional Pain Management  PCP: Moishe Suzen LABOR, NP (Inactive)   Primary Reason for Visit: Interventional Pain Management Treatment. CC: Shoulder Pain (right)  Procedure:          Anesthesia, Analgesia, Anxiolysis:  Type: Therapeutic Glenohumeral Joint (shoulder) Injection  #2 Primary Purpose: Diagnostic Region: Anterior Shoulder Area Level:  Shoulder Target Area: Glenohumeral Joint (shoulder) Approach: Anterolateral approach. Laterality: Right-Sided  Type: Local Anesthesia  Local Anesthetic: Lidocaine  1-2%  Position: Supine   Indications: Right rotator cuff dysfunction, right shoulder pain, right glenohumeral arthropathy.  Pain Score: Pre-procedure: 8 /10 Post-procedure: 0/10   Pre-op Assessment:  [name] is a 63 y.o. (year old), female patient, seen today for interventional treatment. She  has a past surgical history that includes Laparoscopic hysterectomy (10/23/2010); Midurethral sling (05/02/2011); excision of vulvar lesion (05/02/2011); and Breast biopsy (Left, 10/11/2023). [name] has a current medication list which includes the following prescription(s): gabapentin , [START ON 10/01/2024] hydrocodone -acetaminophen , [START ON 10/31/2024]  hydrocodone -acetaminophen , [START ON 11/30/2024] hydrocodone -acetaminophen , magnesium oxide, melatonin, multivitamin, omeprazole , turmeric, valacyclovir , and duloxetine . Her primarily concern today is the Shoulder Pain (right)  Initial Vital Signs:  Pulse/HCG Rate: 68ECG Heart Rate: 76 Temp: (!) 97.1 F (36.2 C) Resp: 18 BP: 127/85 SpO2: 97 %  BMI: Estimated body mass index is 30.41 kg/m as calculated from the following:   Height as of this encounter: 5' 8 (1.727 m).   Weight as of this encounter: 200 lb (90.7 kg).  Risk Assessment: Allergies: Reviewed. She has no known allergies.  Allergy Precautions: None required Coagulopathies: Reviewed. None identified.  Blood-thinner therapy: None at this time Active Infection(s): Reviewed. None identified. [name] is afebrile  Site Confirmation: [name] was asked to confirm the procedure and laterality before marking the site Procedure checklist: Completed Consent: Before the procedure and under the influence of no sedative(s), amnesic(s), or anxiolytics, the patient was informed of the treatment options, risks and possible complications. To fulfill our ethical and legal obligations, as recommended by the American Medical Association's Code of Ethics, I have informed the patient of my clinical impression; the nature and purpose of the treatment or procedure; the risks, benefits, and possible complications of the intervention; the alternatives, including doing nothing; the risk(s) and benefit(s) of the alternative treatment(s) or procedure(s); and the risk(s) and benefit(s) of doing nothing. The patient was provided information about the general risks and possible complications associated with the procedure. These may include, but are not limited to: failure to achieve desired goals, infection, bleeding, organ or nerve damage, allergic reactions, paralysis, and death. In addition, the patient was informed of those risks and complications  associated to the procedure, such as failure to decrease pain; infection; bleeding; organ or nerve damage with subsequent damage to sensory, motor, and/or autonomic systems, resulting in permanent pain, numbness, and/or weakness of one or several areas of the body; allergic reactions; (i.e.: anaphylactic reaction); and/or  death. Furthermore, the patient was informed of those risks and complications associated with the medications. These include, but are not limited to: allergic reactions (i.e.: anaphylactic or anaphylactoid reaction(s)); adrenal axis suppression; blood sugar elevation that in diabetics may result in ketoacidosis or comma; water retention that in patients with history of congestive heart failure may result in shortness of breath, pulmonary edema, and decompensation with resultant heart failure; weight gain; swelling or edema; medication-induced neural toxicity; particulate matter embolism and blood vessel occlusion with resultant organ, and/or nervous system infarction; and/or aseptic necrosis of one or more joints. Finally, the patient was informed that Medicine is not an exact science; therefore, there is also the possibility of unforeseen or unpredictable risks and/or possible complications that may result in a catastrophic outcome. The patient indicated having understood very clearly. We have given the patient no guarantees and we have made no promises. Enough time was given to the patient to ask questions, all of which were answered to the patient's satisfaction. [name] has indicated that she wanted to continue with the procedure. Attestation: I, the ordering provider, attest that I have discussed with the patient the benefits, risks, side-effects, alternatives, likelihood of achieving goals, and potential problems during recovery for the procedure that I have provided informed consent. Date  Time: 09/28/2024  2:09 PM  Pre-Procedure Preparation:  Monitoring: As per clinic protocol.  Respiration, ETCO2, SpO2, BP, heart rate and rhythm monitor placed and checked for adequate function Safety Precautions: Patient was assessed for positional comfort and pressure points before starting the procedure. Time-out: I initiated and conducted the Time-out before starting the procedure, as per protocol. The patient was asked to participate by confirming the accuracy of the Time Out information. Verification of the correct person, site, and procedure were performed and confirmed by me, the nursing staff, and the patient. Time-out conducted as per Joint Commission's Universal Protocol (UP.01.01.01). Time: 1503  Description of Procedure:          Area Prepped: Entire shoulder Area DuraPrep (Iodine Povacrylex [0.7% available iodine] and Isopropyl Alcohol, 74% w/w) Safety Precautions: Aspiration looking for blood return was conducted prior to all injections. At no point did we inject any substances, as a needle was being advanced. No attempts were made at seeking any paresthesias. Safe injection practices and needle disposal techniques used. Medications properly checked for expiration dates. SDV (single dose vial) medications used. Description of the Procedure: Protocol guidelines were followed. The patient was placed in position over the procedure table. The target area was identified and the area prepped in the usual manner. Skin & deeper tissues infiltrated with local anesthetic. Appropriate amount of time allowed to pass for local anesthetics to take effect. The procedure needles were then advanced to the target area. Proper needle placement secured. Negative aspiration confirmed. Solution injected in intermittent fashion, asking for systemic symptoms every 0.5cc of injectate. The needles were then removed and the area cleansed, making sure to leave some of the prepping solution back to take advantage of its long term bactericidal properties.         Vitals:   09/28/24 1421 09/28/24 1503  09/28/24 1507  BP: 127/85 (!) 111/92 132/80  Pulse: 68    Resp: 18 18 17   Temp: (!) 97.1 F (36.2 C)    TempSrc: Temporal    SpO2: 97% 97% 100%  Weight: 200 lb (90.7 kg)    Height: 5' 8 (1.727 m)      Start Time: 1503 hrs. End Time: 1507 hrs.  Materials:  Needle(s) Type: Spinal Needle Gauge: 22G Length: 3.5-in Medication(s): Please see orders for medications and dosing details. 5 cc solution made of 4 cc of 0.2% ropivacaine , 1 cc of methylprednisolone  40 mg/cc Injected after contrast confirmation Imaging Guidance (Non-Spinal):          Type of Imaging Technique: Fluoroscopy Guidance (Non-Spinal) Indication(s): Assistance in needle guidance and placement for procedures requiring needle placement in or near specific anatomical locations not easily accessible without such assistance. Exposure Time: Please see nurses notes. Contrast: Before injecting any contrast, we confirmed that the patient did not have an allergy to iodine, shellfish, or radiological contrast. Once satisfactory needle placement was completed at the desired level, radiological contrast was injected. Contrast injected under live fluoroscopy. No contrast complications. See chart for type and volume of contrast used. Fluoroscopic Guidance: I was personally present during the use of fluoroscopy. Tunnel Vision Technique used to obtain the best possible view of the target area. Parallax error corrected before commencing the procedure. Direction-depth-direction technique used to introduce the needle under continuous pulsed fluoroscopy. Once target was reached, antero-posterior, oblique, and lateral fluoroscopic projection used confirm needle placement in all planes. Images permanently stored in EMR. Interpretation: I personally interpreted the imaging intraoperatively. Adequate needle placement confirmed in multiple planes. Appropriate spread of contrast into desired area was observed. No evidence of afferent or efferent  intravascular uptake. Permanent images saved into the patient's record.  Post-operative Assessment:  Post-procedure Vital Signs:  Pulse/HCG Rate: 6871 Temp: (!) 97.1 F (36.2 C) Resp: 17 BP: 132/80 SpO2: 100 %  EBL: None  Complications: No immediate post-treatment complications observed by team, or reported by patient.  Note: The patient tolerated the entire procedure well. A repeat set of vitals were taken after the procedure and the patient was kept under observation following institutional policy, for this type of procedure. Post-procedural neurological assessment was performed, showing return to baseline, prior to discharge. The patient was provided with post-procedure discharge instructions, including a section on how to identify potential problems. Should any problems arise concerning this procedure, the patient was given instructions to immediately contact us , at any time, without hesitation. In any case, we plan to contact the patient by telephone for a follow-up status report regarding this interventional procedure.  Comments:  No additional relevant information.  Plan of Care  Patient continues to endorse fairly significant axial low back pain related to lumbar spondylosis and facet arthropathy.  She is status post 2 positive diagnostic lumbar facet medial branch nerve blocks done 04/03/2023 and 05/29/2023 both of which provided her with 75% pain relief for approximately 10 days with gradual return of pain thereafter.  The patient has undergone two positive diagnostic lumbar facet medial branch nerve blocks bilaterally at L3,L4, L5, each providing >=80% pain relief for the expected duration of action. Given the reproducible and significant pain relief with these targeted blocks, we discussed proceeding with lumbar radiofrequency ablation (RFA) of the medial branch nerves to achieve longer-term pain reduction and functional improvement. The patient was counseled on the procedure,  expected outcomes, and potential risks, and agrees with the plan.  In regards to her right shoulder, if that is not better after today's injection, recommend right shoulder MRI for further diagnostic evaluation and referral to orthopedics.  Could also consider right suprascapular nerve block.   Orders:  Orders Placed This Encounter  Procedures   DG PAIN CLINIC C-ARM 1-60 MIN NO REPORT    Intraoperative interpretation by procedural physician at [facility].  Standing Status:   Standing    Number of Occurrences:   1    Reason for exam::   Assistance in needle guidance and placement for procedures requiring needle placement in or near specific anatomical locations not easily accessible without such assistance.    Medications ordered for procedure: Meds ordered this encounter  Medications   iohexol  (OMNIPAQUE ) 180 MG/ML injection 10 mL    Must be Myelogram-compatible. If not available, you may substitute with a water-soluble, non-ionic, hypoallergenic, myelogram-compatible radiological contrast medium.   lidocaine  (XYLOCAINE ) 2 % (with pres) injection 400 mg   ropivacaine  (PF) 2 mg/mL (0.2%) (NAROPIN ) injection 4 mL   methylPREDNISolone  acetate (DEPO-MEDROL ) injection 40 mg   Medications administered: We administered iohexol , lidocaine , ropivacaine  (PF) 2 mg/mL (0.2%), and methylPREDNISolone  acetate.  See the medical record for exact dosing, route, and time of administration.  Follow-up plan:   Return for Keep sch. appt.     Recent Visits Date Type Provider Dept  09/15/24 Office Visit Patel, Seema K, NP [facility]  Showing recent visits within past 90 days and meeting all other requirements Today's Visits Date Type Provider Dept  09/28/24 Procedure visit Marcelino Nurse, MD [facility]  Showing today's visits and meeting all other requirements Future Appointments Date Type Provider Dept  12/07/24 Appointment Patel, Seema K, NP [facility]   Showing future appointments within next 90 days and meeting all other requirements  Disposition: Discharge home  Discharge (Date  Time): 09/28/2024; 1515 hrs.   Primary Care Physician: Moishe Suzen LABOR, NP (Inactive) Location: [facility] Note by: Nurse Marcelino, MD Date: 09/28/2024; Time: 3:23 PM  Disclaimer:  Medicine is not an exact science. The only guarantee in medicine is that nothing is guaranteed. It is important to note that the decision to proceed with this intervention was based on the information collected from the patient. The Data and conclusions were drawn from the patient's questionnaire, the interview, and the physical examination. Because the information was provided in large part by the patient, it cannot be guaranteed that it has not been purposely or unconsciously manipulated. Every effort has been made to obtain as much relevant data as possible for this evaluation. It is important to note that the conclusions that lead to this procedure are derived in large part from the available data. Always take into account that the treatment will also be dependent on availability of resources and existing treatment guidelines, considered by other Pain Management Practitioners as being common knowledge and practice, at the time of the intervention. For Medico-Legal purposes, it is also important to point out that variation in procedural techniques and pharmacological choices are the acceptable norm. The indications, contraindications, technique, and results of the above procedure should only be interpreted and judged by a Board-Certified Interventional Pain Specialist with extensive familiarity and expertise in the same exact procedure and technique.

## 2024-09-28 NOTE — Patient Instructions (Signed)

## 2024-09-29 ENCOUNTER — Telehealth: Payer: Self-pay

## 2024-09-29 NOTE — Telephone Encounter (Signed)
 Called PP. Some pain, noted but states this is not bad. Instructed to call us  back if needed.

## 2024-10-07 ENCOUNTER — Telehealth: Payer: Self-pay

## 2024-10-07 DIAGNOSIS — M47816 Spondylosis without myelopathy or radiculopathy, lumbar region: Secondary | ICD-10-CM

## 2024-10-07 NOTE — Telephone Encounter (Signed)
 Her insurance requires her to have 2 MBNB within the last 6 months. She has only had one in the past 6 months. We will have to do another facet block before I can request prior auth for the RFA. Her first MBNB was April 2024. Please put in order for facets. Thanks

## 2024-10-16 ENCOUNTER — Ambulatory Visit: Payer: Self-pay

## 2024-10-16 NOTE — Telephone Encounter (Signed)
 FYI Only or Action Required?: FYI only for provider: appointment scheduled on 10/23/2024.  Patient was last seen in primary care on unknown.  Called Nurse Triage reporting Dizziness.  Symptoms began yesterday.  Interventions attempted: Nothing.  Symptoms are: gradually worsening.  Triage Disposition: See Physician Within 24 Hours  Patient/caregiver understands and will follow disposition?: Unsure        Copied from CRM #[id number]. Topic: Clinical - Red Word Triage >> Oct 16, 2024 10:24 AM Delon HERO wrote: Red Word that prompted transfer to Nurse Triage: Patient is calling to report dizziness starting yesterday, and feeling pulse in her neck. No Nausea. Previous symptoms but disappeared.  Previously on BP medication about 5 years ago. Have not taken BP. Reason for Disposition  [1] MODERATE dizziness (e.g., interferes with normal activities) AND [2] has NOT been evaluated by doctor (or NP/PA) for this  (Exception: Dizziness caused by heat exposure, sudden standing, or poor fluid intake.)  Answer Assessment - Initial Assessment Questions BP was 162/108 this morning. Pt advised to go to UC for symptoms. Patient also requested to establish care with a PCP. Appointment made.   1. DESCRIPTION: Describe your dizziness.     Feels her heart in her throat and feels she needs to take some deep breaths 2. LIGHTHEADED: Do you feel lightheaded? (e.g., somewhat faint, woozy, weak upon standing)     Yes 3. VERTIGO: Do you feel like either you or the room is spinning or tilting? (i.e., vertigo)     Denies  4. SEVERITY: How bad is it?  Do you feel like you are going to faint? Can you stand and walk?     Denies  5. ONSET:  When did the dizziness begin?     Yesterday  6. HEART RATE: Can you tell me your heart rate? How many beats in 15 seconds?  (Note: Not all patients can do this.)       She felt her heart racing in her throat yesterday  7. CAUSE: What do you think is  causing the dizziness? (e.g., decreased fluids or food, diarrhea, emotional distress, heat exposure, new medicine, sudden standing, vomiting; unknown)     Due to BP  8. RECURRENT SYMPTOM: Have you had dizziness before? If Yes, ask: When was the last time? What happened that time?     Yes in the past sporadically  9. OTHER SYMPTOMS: Do you have any other symptoms? (e.g., fever, chest pain, vomiting, diarrhea, bleeding)       Headache, fatigue  Protocols used: Dizziness - Lightheadedness-A-AH

## 2024-10-23 ENCOUNTER — Ambulatory Visit

## 2024-10-23 VITALS — BP 125/85 | HR 84 | Temp 98.2°F | Ht 68.0 in | Wt 232.0 lb

## 2024-10-23 DIAGNOSIS — G43809 Other migraine, not intractable, without status migrainosus: Secondary | ICD-10-CM

## 2024-10-23 DIAGNOSIS — I10 Essential (primary) hypertension: Secondary | ICD-10-CM

## 2024-10-23 DIAGNOSIS — R0683 Snoring: Secondary | ICD-10-CM | POA: Diagnosis not present

## 2024-10-23 DIAGNOSIS — Z6835 Body mass index (BMI) 35.0-35.9, adult: Secondary | ICD-10-CM

## 2024-10-23 DIAGNOSIS — E785 Hyperlipidemia, unspecified: Secondary | ICD-10-CM | POA: Diagnosis not present

## 2024-10-23 DIAGNOSIS — R7303 Prediabetes: Secondary | ICD-10-CM

## 2024-10-23 DIAGNOSIS — M797 Fibromyalgia: Secondary | ICD-10-CM

## 2024-10-23 DIAGNOSIS — E66812 Obesity, class 2: Secondary | ICD-10-CM

## 2024-10-23 DIAGNOSIS — K219 Gastro-esophageal reflux disease without esophagitis: Secondary | ICD-10-CM

## 2024-10-23 DIAGNOSIS — R600 Localized edema: Secondary | ICD-10-CM

## 2024-10-23 DIAGNOSIS — M150 Primary generalized (osteo)arthritis: Secondary | ICD-10-CM

## 2024-10-23 DIAGNOSIS — B001 Herpesviral vesicular dermatitis: Secondary | ICD-10-CM

## 2024-10-23 LAB — CBC WITH DIFFERENTIAL/PLATELET
Basophils Absolute: 0.1 K/uL (ref 0.0–0.1)
Basophils Relative: 1.2 % (ref 0.0–3.0)
Eosinophils Absolute: 0.1 K/uL (ref 0.0–0.7)
Eosinophils Relative: 1.4 % (ref 0.0–5.0)
HCT: 43.7 % (ref 36.0–46.0)
Hemoglobin: 14.7 g/dL (ref 12.0–15.0)
Lymphocytes Relative: 28.1 % (ref 12.0–46.0)
Lymphs Abs: 1.6 K/uL (ref 0.7–4.0)
MCHC: 33.6 g/dL (ref 30.0–36.0)
MCV: 83.6 fl (ref 78.0–100.0)
Monocytes Absolute: 0.4 K/uL (ref 0.1–1.0)
Monocytes Relative: 6.6 % (ref 3.0–12.0)
Neutro Abs: 3.6 K/uL (ref 1.4–7.7)
Neutrophils Relative %: 62.7 % (ref 43.0–77.0)
Platelets: 207 K/uL (ref 150.0–400.0)
RBC: 5.23 Mil/uL — ABNORMAL HIGH (ref 3.87–5.11)
RDW: 14.4 % (ref 11.5–15.5)
WBC: 5.8 K/uL (ref 4.0–10.5)

## 2024-10-23 LAB — LIPID PANEL
Cholesterol: 200 mg/dL (ref 0–200)
HDL: 43.7 mg/dL (ref >39.00)
LDL Cholesterol: 107 mg/dL — ABNORMAL HIGH (ref 0–99)
NonHDL: 156.37
Total CHOL/HDL Ratio: 5
Triglycerides: 246 mg/dL — ABNORMAL HIGH (ref 0.0–149.0)
VLDL: 49.2 mg/dL — ABNORMAL HIGH (ref 0.0–40.0)

## 2024-10-23 LAB — HEMOGLOBIN A1C: Hgb A1c MFr Bld: 5.7 % (ref 4.6–6.5)

## 2024-10-23 LAB — COMPREHENSIVE METABOLIC PANEL WITH GFR
ALT: 27 U/L (ref 0–35)
AST: 20 U/L (ref 0–37)
Albumin: 4.2 g/dL (ref 3.5–5.2)
Alkaline Phosphatase: 87 U/L (ref 39–117)
BUN: 10 mg/dL (ref 6–23)
CO2: 27 meq/L (ref 19–32)
Calcium: 9.6 mg/dL (ref 8.4–10.5)
Chloride: 106 meq/L (ref 96–112)
Creatinine, Ser: 0.8 mg/dL (ref 0.40–1.20)
GFR: 79.64 mL/min (ref >60.00)
Glucose, Bld: 81 mg/dL (ref 70–99)
Potassium: 4.5 meq/L (ref 3.5–5.1)
Sodium: 142 meq/L (ref 135–145)
Total Bilirubin: 0.5 mg/dL (ref 0.2–1.2)
Total Protein: 6.5 g/dL (ref 6.0–8.3)

## 2024-10-23 LAB — TSH: TSH: 1.07 u[IU]/mL (ref 0.35–5.50)

## 2024-10-23 LAB — MICROALBUMIN / CREATININE URINE RATIO
Creatinine,U: 49.7 mg/dL
Microalb Creat Ratio: UNDETERMINED mg/g (ref 0.0–30.0)
Microalb, Ur: <0.7 mg/dL

## 2024-10-23 MED ORDER — VALSARTAN 80 MG PO TABS: 80.0000 mg | ORAL_TABLET | Freq: Every day | ORAL | 3 refills | Status: DC

## 2024-10-23 MED ORDER — OMEPRAZOLE 20 MG PO CPDR: 20.0000 mg | DELAYED_RELEASE_CAPSULE | Freq: Every day | ORAL | 3 refills | Status: DC

## 2024-10-23 MED ORDER — VALACYCLOVIR HCL 500 MG PO TABS: 500.0000 mg | ORAL_TABLET | Freq: Two times a day (BID) | ORAL | 1 refills | Status: AC

## 2024-10-23 NOTE — Progress Notes (Signed)
 New Patient Office Visit Previous PCP: Dr. Randine, has seen Dr. Maribeth in the past   Subjective   Patient ID: [name], female    DOB: [birth year]  Age: 63 y.o. MRN: [id number]  CC:  Chief Complaint  Patient presents with   Establish Care   Hypertension   Dizziness    HPI [name] presents to establish care. She  has a past medical history of Adult BMI 32.0-32.9 kg/sq m (10/28/2020), Anemia (2011), Arthritis, Arthritis of carpometacarpal [facility]) joint of both thumbs (03/19/2023), Arthritis of wrist (07/04/2011), Bilateral hip pain (10/13/2019), Bilateral primary osteoarthritis of knee (10/13/2019), Bilateral wrist pain (09/21/2021), Cervical radicular pain (left) (12/26/2021), Chronic pain syndrome (01/30/2019), Chronic right shoulder pain (09/27/2020), Chronic SI joint pain (10/13/2019), Degenerative disorder of bone, Dyslipidemia (09/04/2023), Elevated BP without diagnosis of hypertension (09/30/2020), Family history of diabetes mellitus (01/30/2019), Family history of heart disease (01/30/2019), Fever blister (08/21/2023), Fibromyalgia (09/21/2021), Genuine stress incontinence, female (2012), GERD (gastroesophageal reflux disease), History of squamous cell carcinoma of skin (10/28/2020), Hypertension (02/01/2021), Insomnia (01/13/2015), Long-term current use of opiate analgesic (01/02/2018), Lumbar radiculopathy (03/23/2020), Lumbar spondylosis (01/30/2019), Menopause (06/16/2018), Migraines, Obesity, Palpitations (08/21/2023), Prediabetes (02/01/2021), Primary osteoarthritis of right shoulder (09/27/2020), 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  Discussed the use of AI scribe software for clinical note transcription with the patient, who gave verbal consent to proceed.  History of Present  Illness [name] is a 63 year old female with elevated blood pressure reading without diagnosis of hypertension who presents with elevated blood pressure readings at home and has few other concerns.   Her blood pressure was 162/108 mmHg last Friday, prompting her to call urgent care and schedule this appointment. She has a history of hypertension, initially treated with medication due to elevated blood pressure caused by Cymbalta , prescribed for fibromyalgia and chronic pain. She discontinued Cymbalta  due to side effects and is not currently on any blood pressure medication. She experiences lightheadedness and difficulty concentrating.  She experiences persistent pain from fibromyalgia and degenerative bone disease, affecting her neck, lower back, knees, hands, and shoulders. Her pain is constant, with significant discomfort in her knees, hands, and shoulders. She takes gabapentin  900 mg at night and hydrocodone  7.5 mg  three times daily for pain management. She also takes magnesium oxide 400 mg, melatonin 15 mg, and a daily multivitamin to aid with sleep and overall health. She follows up with pain clinic closely for this. Due to polyarthritis, fibromyalgia it is not always easy for patient to participate in certain exercises however, she keeps on walking daily.   She has a history of herpetic lesions, experiencing flare-ups approximately twice a year, and takes Valtrex  500 mg twice daily for 3 days during flare-ups.   She also has a history of apnea like episodes at sleep in the past, with a home sleep study conducted last year that was inconclusive due to difficulty sleeping during the test. Her daughter has noted episodes of snoring and heavy breathing during sleep.   She experiences swelling in her legs, particularly towards the end of the day and week, which she attributes to circulation issues. She works at a desk job and tries to take breaks to walk and elevate her legs when  possible.  She has a history of mildly elevated LDL and triglycerides, with the most recent test showing improvement. She also  has a history of prediabetes, with elevated blood glucose levels in the past. She monitors her diet closely, eating yogurt with granola for breakfast, salads, and home-cooked meals, and avoids fast food. She reports no alcohol or tobacco use and follows a healthy diet, avoiding fried foods and sodas, and primarily drinking water and occasional sweet tea.  She used to get migraines headaches which has resolved after undergoing hysterectomy.     Outpatient Encounter Medications as of 10/23/2024  Medication Sig   gabapentin  (NEURONTIN ) 600 MG tablet Take 1 tablet (600 mg total) by mouth at bedtime. 300 mg qday, 600 mg qhs   HYDROcodone -acetaminophen  (NORCO) 7.5-325 MG tablet Take 1 tablet by mouth every 8 (eight) hours as needed for moderate pain (pain score 4-6).   [START ON 10/31/2024] HYDROcodone -acetaminophen  (NORCO) 7.5-325 MG tablet Take 1 tablet by mouth every 8 (eight) hours as needed for moderate pain (pain score 4-6).   [START ON 11/30/2024] HYDROcodone -acetaminophen  (NORCO) 7.5-325 MG tablet Take 1 tablet by mouth every 8 (eight) hours as needed for moderate pain (pain score 4-6).   magnesium oxide (MAG-OX) 400 (240 Mg) MG tablet Take 400 mg by mouth daily.   Melatonin 5 MG CAPS Take 1 capsule by mouth daily.   Multiple Vitamin (MULTIVITAMIN) tablet Take 1 tablet by mouth daily.   Turmeric (QC TUMERIC COMPLEX) 500 MG CAPS Take by mouth.   valsartan (DIOVAN) 80 MG tablet Take 1 tablet (80 mg total) by mouth daily.   [DISCONTINUED] omeprazole  (PRILOSEC) 20 MG capsule 1 capsule.   [DISCONTINUED] valACYclovir  (VALTREX ) 500 MG tablet Take 1 tablet (500 mg total) by mouth 2 (two) times daily.   valACYclovir  (VALTREX ) 500 MG tablet Take 1 tablet (500 mg total) by mouth 2 (two) times daily.   [DISCONTINUED] DULoxetine  (CYMBALTA ) 20 MG capsule Take 1 capsule (20 mg  total) by mouth daily. (Patient not taking: Reported on 09/28/2024)   [DISCONTINUED] omeprazole  (PRILOSEC) 20 MG capsule Take 1 capsule (20 mg total) by mouth daily.   No facility-administered encounter medications on file as of 10/23/2024.    Past Surgical History:  Procedure Laterality Date   ABDOMINAL HYSTERECTOMY  2011   BREAST BIOPSY Left 10/11/2023   MM LT BREAST BX W LOC DEV 1ST LESION IMAGE BX SPEC STEREO GUIDE 10/11/2023 [facility]   excision of vulvar lesion  05/02/2011   LAPAROSCOPIC HYSTERECTOMY  10/23/2010   mennorhagia/fibroids   Midurethral sling  05/02/2011   SPINE SURGERY     Injections and oblasion    ROS As per HPI    Objective    BP 125/85   Pulse 84   Temp 98.2 F (36.8 C) (Oral)   Ht 5' 8 (1.727 m)   Wt 232 lb (105.2 kg)   SpO2 96%   BMI 35.28 kg/m     10/23/2024   10:42 AM 09/15/2024    9:17 AM 06/25/2024    3:02 PM  Depression screen PHQ 2/9  Decreased Interest 0 1 0  Down, Depressed, Hopeless 0 0 0  PHQ - 2 Score 0 1 0  Altered sleeping 1    Tired, decreased energy 2    Change in appetite 0    Feeling bad or failure about yourself  0    Trouble concentrating 0    Moving slowly or fidgety/restless 0    Suicidal thoughts 0    PHQ-9 Score 3    Difficult doing work/chores Somewhat difficult        10/23/2024   10:42  AM 10/28/2020   10:53 AM  GAD 7 : Generalized Anxiety Score  Nervous, Anxious, on Edge 0 1  Control/stop worrying 0 0  Worry too much - different things 0 1  Trouble relaxing 1 0  Restless 0 1  Easily annoyed or irritable 0 1  Afraid - awful might happen 0 1  Total GAD 7 Score 1 5  Anxiety Difficulty Not difficult at all Not difficult at all    SDOH Screenings   Food Insecurity: Food Insecurity Present (10/21/2024)  Housing: High Risk (10/21/2024)  Transportation Needs: No Transportation Needs (10/21/2024)  Depression (PHQ2-9): Low Risk  (10/23/2024)  Financial Resource Strain: Medium Risk (10/21/2024)  Physical  Activity: Insufficiently Active (10/21/2024)  Social Connections: Moderately Isolated (10/21/2024)  Stress: No Stress Concern Present (10/21/2024)  Tobacco Use: Medium Risk (10/23/2024)     Physical Exam Constitutional:      Appearance: Normal appearance. She is obese.  HENT:     Head: Normocephalic and atraumatic.     Right Ear: Tympanic membrane normal.     Left Ear: Tympanic membrane normal.     Mouth/Throat:     Mouth: Mucous membranes are moist.  Neck:     Thyroid : No thyroid  mass or thyroid  tenderness.  Cardiovascular:     Rate and Rhythm: Normal rate and regular rhythm.     Heart sounds: No murmur heard. Pulmonary:     Effort: Pulmonary effort is normal.     Breath sounds: Normal breath sounds. No wheezing.  Abdominal:     General: Abdomen is protuberant. Bowel sounds are normal.     Palpations: Abdomen is soft.     Tenderness: There is no guarding.  Musculoskeletal:     Cervical back: Neck supple. No rigidity or tenderness.     Right lower leg: Edema present.     Left lower leg: Edema present.     Comments: Bilateral 1+ pitting edema bilaterally   Skin:    General: Skin is warm.  Neurological:     Mental Status: She is alert and oriented to person, place, and time.     Gait: Gait normal.  Psychiatric:        Mood and Affect: Mood normal.        Behavior: Behavior normal. Behavior is cooperative.        Lab Results  Component Value Date   TSH 1.210 08/21/2023   Lab Results  Component Value Date   WBC 5.7 08/21/2023   HGB 14.1 08/21/2023   HCT 43.7 08/21/2023   MCV 83 08/21/2023   PLT 254 08/21/2023   Lab Results  Component Value Date   NA 139 11/21/2023   K 4.3 11/21/2023   CO2 25 11/21/2023   GLUCOSE 90 11/21/2023   BUN 9 11/21/2023   CREATININE 0.82 11/21/2023   BILITOT 0.5 11/21/2023   ALKPHOS 98 11/21/2023   AST 22 11/21/2023   ALT 24 11/21/2023   PROT 6.4 11/21/2023   ALBUMIN 4.1 11/21/2023   CALCIUM 9.7 11/21/2023   ANIONGAP 7  02/24/2021   EGFR 82 11/21/2023   GFR 74.95 02/09/2021   Lab Results  Component Value Date   CHOL 206 (H) 08/21/2023   CHOL 159 10/28/2020   CHOL 184 01/30/2019   Lab Results  Component Value Date   HDL 44 08/21/2023   HDL 42.80 10/28/2020   HDL 40.60 01/30/2019   Lab Results  Component Value Date   LDLCALC 124 (H) 08/21/2023   LDLCALC 92 10/28/2020  LDLCALC 110 (H) 01/30/2019   Lab Results  Component Value Date   TRIG 218 (H) 08/21/2023   TRIG 121.0 10/28/2020   TRIG 166.0 (H) 01/30/2019   Lab Results  Component Value Date   CHOLHDL 4.7 (H) 08/21/2023   CHOLHDL 4 10/28/2020   CHOLHDL 5 01/30/2019   Lab Results  Component Value Date   HGBA1C 5.6 08/21/2023   HGBA1C 5.6 02/01/2021   HGBA1C 5.9 10/28/2020   Assessment & Plan:   Assessment & Plan Primary hypertension BP elevated on multiple occasions, over goal of <130/80 mmHg.  New diagnosis of hypertension. Start Valsartan 80 mg daily.  Check following labs to rule out reversible causes for hypertension.  Monitor blood pressure at home three times a week and record readings. Provided information on DASH diet. Encouraged 30 minutes of moderate intensity exercise five days a week. Follow up in 4-6 weeks with home blood pressure readings, sooner if new symptoms/side effects occurs after starting antihypertensive medication.  Orders:   CBC with Differential/Platelet   Comprehensive metabolic panel with GFR   TSH   Urine Microalbumin w/creat. ratio   valsartan (DIOVAN) 80 MG tablet; Take 1 tablet (80 mg total) by mouth daily.  Prediabetes Previous elevated blood glucose levels. Discussed dietary modifications to prevent progression. Ordered A1c test.  Orders:   Hemoglobin A1c  Dyslipidemia Mildly elevated LDL and triglycerides. Previous improvement noted. Discussed dietary modifications. Check lipid panel.   Orders:   Lipid panel  Snoring Reports of snoring and possible sleep apnea. Previous home  sleep study inconclusive. Discussed referral to sleep specialist again. Will continue to follow up.      Gastroesophageal reflux disease without esophagitis Chronic, stable on Omeprazole  20 mg daily which she gets over the counter. Continue. Benefits of current regimen outweigh risks.    Fever blister Occasional flare-ups about twice per year managed with Valtrex . Continue Valtrex  500 mg, twice a day for three days in a row during flare-ups. Refill sent. Orders:   valACYclovir  (VALTREX ) 500 MG tablet; Take 1 tablet (500 mg total) by mouth 2 (two) times daily.  Class 2 severe obesity with serious comorbidity and body mass index (BMI) of 35.0 to 35.9 in adult, unspecified obesity type Co-morbidities includes new diagnosis of hypertension, mildly elevated LDL, prediabetes, polyarthritis. She has had hard time with weight loss despite eating healthy food, participating in exeresis as tolerated due to polyarthritis.   Discussed continue lifestyle modifications. Interested in pharmacological intervention for weight loss. No history of thyroid  cancer in patient or her family, no h/o pancreatitis.   Follow up in 6 weeks, consider starting her on either Zepbound or Wegovy.     Other migraine without status migrainosus, not intractable Resolved since hysterectomy.     Fibromyalgia Fibromyalgia with chronic pain syndrome and polyarthritis, managed by pain specialist with gabapentin  and hydrocodone . Discussed benefits of aerobic exercise and stretching. Continue follow up with pain specialist.      Primary osteoarthritis involving multiple joints Fibromyalgia with chronic pain syndrome and polyarthritis, managed by pain specialist with gabapentin  and hydrocodone . Discussed benefits of aerobic exercise and stretching. Continue follow up with pain specialist.     Edema of both lower legs Intermittent edema, worse at day's end, suspect dependent edema. Check CMP, urine microalbumin ratio.  Recommended compression stockings with 20-25 mmHg pressure. Follow up in 6 weeks.        Return in about 4 weeks (around 11/20/2024) for 4-6 weeks for hypertension, weight loss. .   Danell Vazquez,  MD

## 2024-10-23 NOTE — Assessment & Plan Note (Addendum)
 Mildly elevated LDL and triglycerides. Previous improvement noted. Discussed dietary modifications. Check lipid panel.   Orders:   Lipid panel

## 2024-10-23 NOTE — Assessment & Plan Note (Addendum)
Resolved since hysterectomy.

## 2024-10-23 NOTE — Assessment & Plan Note (Addendum)
 Fibromyalgia with chronic pain syndrome and polyarthritis, managed by pain specialist with gabapentin  and hydrocodone . Discussed benefits of aerobic exercise and stretching. Continue follow up with pain specialist.

## 2024-10-23 NOTE — Patient Instructions (Addendum)
 Diet: DASH diet, low salt, high potassium food intake. Emphasize whole grains, lean proteins, fruits, and vegetables. Limit processed foods and sugary drinks. Exercise: Aim for 150 minutes of moderate aerobic activity weekly plus strength training twice a week.  Check BP 3 times a week, keep track of blood pressure and bring it to review during your follow up visit. Goal BP is less than 130/80 mmHg.   Start Valsartan 80 mg once daily to help with blood pressure. Reach out if you develop new concerns when you are on it.    Compression socks 20-25 mmHg daily at work.

## 2024-10-23 NOTE — Assessment & Plan Note (Addendum)
 Chronic, stable on Omeprazole  20 mg daily which she gets over the counter. Continue. Benefits of current regimen outweigh risks.

## 2024-10-23 NOTE — Assessment & Plan Note (Addendum)
 Co-morbidities includes new diagnosis of hypertension, mildly elevated LDL, prediabetes, polyarthritis. She has had hard time with weight loss despite eating healthy food, participating in exeresis as tolerated due to polyarthritis.   Discussed continue lifestyle modifications. Interested in pharmacological intervention for weight loss. No history of thyroid  cancer in patient or her family, no h/o pancreatitis.   Follow up in 6 weeks, consider starting her on either Zepbound or Wegovy.

## 2024-10-23 NOTE — Assessment & Plan Note (Addendum)
 Reports of snoring and possible sleep apnea. Previous home sleep study inconclusive. Discussed referral to sleep specialist again. Will continue to follow up.

## 2024-10-23 NOTE — Assessment & Plan Note (Signed)
 Intermittent edema, worse at day's end, suspect dependent edema. Check CMP, urine microalbumin ratio. Recommended compression stockings with 20-25 mmHg pressure. Follow up in 6 weeks.

## 2024-10-23 NOTE — Assessment & Plan Note (Addendum)
 Previous elevated blood glucose levels. Discussed dietary modifications to prevent progression. Ordered A1c test.  Orders:   Hemoglobin A1c

## 2024-10-23 NOTE — Assessment & Plan Note (Addendum)
 Occasional flare-ups about twice per year managed with Valtrex . Continue Valtrex  500 mg, twice a day for three days in a row during flare-ups. Refill sent. Orders:   valACYclovir  (VALTREX ) 500 MG tablet; Take 1 tablet (500 mg total) by mouth 2 (two) times daily.

## 2024-10-23 NOTE — Assessment & Plan Note (Addendum)
 BP elevated on multiple occasions, over goal of <130/80 mmHg.  New diagnosis of hypertension. Start Valsartan 80 mg daily.  Check following labs to rule out reversible causes for hypertension.  Monitor blood pressure at home three times a week and record readings. Provided information on DASH diet. Encouraged 30 minutes of moderate intensity exercise five days a week. Follow up in 4-6 weeks with home blood pressure readings, sooner if new symptoms/side effects occurs after starting antihypertensive medication.  Orders:   CBC with Differential/Platelet   Comprehensive metabolic panel with GFR   TSH   Urine Microalbumin w/creat. ratio   valsartan (DIOVAN) 80 MG tablet; Take 1 tablet (80 mg total) by mouth daily.

## 2024-10-25 ENCOUNTER — Ambulatory Visit: Payer: Self-pay

## 2024-10-25 NOTE — Progress Notes (Signed)
 The 10-year ASCVD risk score (Arnett DK, et al., 2019) is: 5.4%   Values used to calculate the score:     Age: 63 years     Clincally relevant sex: Female     Is Non-Hispanic African American: No     Diabetic: No     Tobacco smoker: No     Systolic Blood Pressure: 125 mmHg     Is BP treated: Yes     HDL Cholesterol: 43.7 mg/dL     Total Cholesterol: 200 mg/dL

## 2024-10-28 ENCOUNTER — Ambulatory Visit
Admission: RE | Admit: 2024-10-28 | Discharge: 2024-10-28 | Disposition: A | Discharge status: Home / Self Care | Source: Ambulatory Visit | Attending: Student in an Organized Health Care Education/Training Program | Admitting: Student in an Organized Health Care Education/Training Program

## 2024-10-28 ENCOUNTER — Ambulatory Visit (HOSPITAL_BASED_OUTPATIENT_CLINIC_OR_DEPARTMENT_OTHER): Admitting: Student in an Organized Health Care Education/Training Program

## 2024-10-28 ENCOUNTER — Encounter: Payer: Self-pay | Admitting: Student in an Organized Health Care Education/Training Program

## 2024-10-28 DIAGNOSIS — M47816 Spondylosis without myelopathy or radiculopathy, lumbar region: Secondary | ICD-10-CM

## 2024-10-28 MED ORDER — DIAZEPAM 5 MG PO TABS
ORAL_TABLET | ORAL | Status: AC
Start: 2024-10-28 — End: 2024-10-28
  Filled 2024-10-28: qty 1

## 2024-10-28 MED ORDER — LIDOCAINE HCL 2 % IJ SOLN
20.0000 mL | Freq: Once | INTRAMUSCULAR | Status: AC
  Administered 2024-10-28: 400 mg
  Filled 2024-10-28: qty 20

## 2024-10-28 MED ORDER — ROPIVACAINE HCL 2 MG/ML IJ SOLN
18.0000 mL | Freq: Once | INTRAMUSCULAR | Status: AC
  Administered 2024-10-28: 18 mL via PERINEURAL
  Filled 2024-10-28: qty 20

## 2024-10-28 MED ORDER — DIAZEPAM 5 MG PO TABS
5.0000 mg | ORAL_TABLET | ORAL | Status: AC
  Administered 2024-10-28: 5 mg via ORAL

## 2024-10-28 NOTE — Progress Notes (Signed)
 PROVIDER NOTE: Information contained herein reflects review and annotations entered in association with encounter. Interpretation of such information and data should be left to medically-trained personnel. Information provided to patient can be located elsewhere in the medical record under Patient Instructions. Document created using STT-dictation technology, any transcriptional errors that may result from process are unintentional.    Patient: [name]  Service Category: Procedure  Provider: Wallie Sherry, MD  DOB: 25-Apr-1961  DOS: 10/28/2024  Location: [facility]  MRN: [id number]  Setting: Ambulatory - outpatient  Referring Provider: Sherry Wallie, MD  Type: Established Patient  Specialty: Interventional Pain Management  PCP: Bair, Kalpana, MD   Primary Reason for Visit: Interventional Pain Management Treatment. CC: Back Pain (lower)  Procedure:          Anesthesia, Analgesia, Anxiolysis:  Type: Lumbar Facet, Medial Branch Block(s) #3  Primary Purpose: Diagnostic Region: Posterolateral Lumbosacral Spine Level:L3, L4, L5, Medial Branch Level(s). Injecting these levels blocks the L3-4, L4-5, lumbar facet joints. Laterality: Bilateral  Type: Local Anesthesia Anxiolysis: 5 mg PO Valium  Sedation: Meaningful verbal contact was maintained at all times during the procedure  Local Anesthetic: Lidocaine  1-2%  Position: Prone   Indications: 1. Lumbar facet arthropathy   2. Lumbar spondylosis    Pain Score: Pre-procedure: 8 /10 Post-procedure: 8 /10   Pre-op Assessment:  [name] is a 63 y.o. (year old), female patient, seen today for interventional treatment. She  has a past surgical history that includes Laparoscopic hysterectomy (10/23/2010); Midurethral sling (05/02/2011); excision of vulvar lesion (05/02/2011); Breast biopsy (Left, 10/11/2023); Abdominal hysterectomy (2011); and Spine surgery. [name] has a current medication list which includes the following  prescription(s): gabapentin , hydrocodone -acetaminophen , [START ON 10/31/2024] hydrocodone -acetaminophen , [START ON 11/30/2024] hydrocodone -acetaminophen , magnesium oxide, melatonin, multivitamin, turmeric, valacyclovir , and valsartan. Her primarily concern today is the Back Pain (lower)  Initial Vital Signs:  Pulse/HCG Rate: 64ECG Heart Rate: (!) 58 Temp: (!) 97.4 F (36.3 C) Resp: 18 BP: 119/81 SpO2: 96 %  BMI: Estimated body mass index is 34.21 kg/m as calculated from the following:   Height as of this encounter: 5' 8 (1.727 m).   Weight as of this encounter: 225 lb (102.1 kg).  Risk Assessment: Allergies: Reviewed. She has no known allergies.  Allergy Precautions: None required Coagulopathies: Reviewed. None identified.  Blood-thinner therapy: None at this time Active Infection(s): Reviewed. None identified. [name] is afebrile  Site Confirmation: [name] was asked to confirm the procedure and laterality before marking the site Procedure checklist: Completed Consent: Before the procedure and under the influence of no sedative(s), amnesic(s), or anxiolytics, the patient was informed of the treatment options, risks and possible complications. To fulfill our ethical and legal obligations, as recommended by the American Medical Association's Code of Ethics, I have informed the patient of my clinical impression; the nature and purpose of the treatment or procedure; the risks, benefits, and possible complications of the intervention; the alternatives, including doing nothing; the risk(s) and benefit(s) of the alternative treatment(s) or procedure(s); and the risk(s) and benefit(s) of doing nothing. The patient was provided information about the general risks and possible complications associated with the procedure. These may include, but are not limited to: failure to achieve desired goals, infection, bleeding, organ or nerve damage, allergic reactions, paralysis, and death. In addition,  the patient was informed of those risks and complications associated to Spine-related procedures, such as failure to decrease pain; infection (i.e.: Meningitis, epidural or intraspinal abscess); bleeding (i.e.: epidural hematoma, subarachnoid hemorrhage,  or any other type of intraspinal or peri-dural bleeding); organ or nerve damage (i.e.: Any type of peripheral nerve, nerve root, or spinal cord injury) with subsequent damage to sensory, motor, and/or autonomic systems, resulting in permanent pain, numbness, and/or weakness of one or several areas of the body; allergic reactions; (i.e.: anaphylactic reaction); and/or death. Furthermore, the patient was informed of those risks and complications associated with the medications. These include, but are not limited to: allergic reactions (i.e.: anaphylactic or anaphylactoid reaction(s)); adrenal axis suppression; blood sugar elevation that in diabetics may result in ketoacidosis or comma; water retention that in patients with history of congestive heart failure may result in shortness of breath, pulmonary edema, and decompensation with resultant heart failure; weight gain; swelling or edema; medication-induced neural toxicity; particulate matter embolism and blood vessel occlusion with resultant organ, and/or nervous system infarction; and/or aseptic necrosis of one or more joints. Finally, the patient was informed that Medicine is not an exact science; therefore, there is also the possibility of unforeseen or unpredictable risks and/or possible complications that may result in a catastrophic outcome. The patient indicated having understood very clearly. We have given the patient no guarantees and we have made no promises. Enough time was given to the patient to ask questions, all of which were answered to the patient's satisfaction. [name] has indicated that she wanted to continue with the procedure. Attestation: I, the ordering provider, attest that I have  discussed with the patient the benefits, risks, side-effects, alternatives, likelihood of achieving goals, and potential problems during recovery for the procedure that I have provided informed consent. Date  Time: 10/28/2024  1:17 PM  Pre-Procedure Preparation:  Monitoring: As per clinic protocol. Respiration, ETCO2, SpO2, BP, heart rate and rhythm monitor placed and checked for adequate function Safety Precautions: Patient was assessed for positional comfort and pressure points before starting the procedure. Time-out: I initiated and conducted the Time-out before starting the procedure, as per protocol. The patient was asked to participate by confirming the accuracy of the Time Out information. Verification of the correct person, site, and procedure were performed and confirmed by me, the nursing staff, and the patient. Time-out conducted as per Joint Commission's Universal Protocol ([email or web address].01.01.01). Time: 1415  Description of Procedure:          Laterality: Bilateral. The procedure was performed in identical fashion on both sides. Levels:  L3, L4, L5,  Medial Branch Level(s) Area Prepped: Posterior Lumbosacral Region DuraPrep (Iodine Povacrylex [0.7% available iodine] and Isopropyl Alcohol, 74% w/w) Safety Precautions: Aspiration looking for blood return was conducted prior to all injections. At no point did we inject any substances, as a needle was being advanced. Before injecting, the patient was told to immediately notify me if she was experiencing any new onset of ringing in the ears, or metallic taste in the mouth. No attempts were made at seeking any paresthesias. Safe injection practices and needle disposal techniques used. Medications properly checked for expiration dates. SDV (single dose vial) medications used. After the completion of the procedure, all disposable equipment used was discarded in the proper designated medical waste containers. Local Anesthesia: Protocol guidelines  were followed. The patient was positioned over the fluoroscopy table. The area was prepped in the usual manner. The time-out was completed. The target area was identified using fluoroscopy. A 12-in long, straight, sterile hemostat was used with fluoroscopic guidance to locate the targets for each level blocked. Once located, the skin was marked with an approved surgical skin marker. Once all  sites were marked, the skin (epidermis, dermis, and hypodermis), as well as deeper tissues (fat, connective tissue and muscle) were infiltrated with a small amount of a short-acting local anesthetic, loaded on a 10cc syringe with a 25G, 1.5-in  Needle. An appropriate amount of time was allowed for local anesthetics to take effect before proceeding to the next step. Local Anesthetic: Lidocaine  2.0% The unused portion of the local anesthetic was discarded in the proper designated containers. Technical explanation of process:  L3 Medial Branch Nerve Block (MBB): The target area for the L3 medial branch is at the junction of the postero-lateral aspect of the superior articular process and the superior, posterior, and medial edge of the transverse process of L4. Under fluoroscopic guidance, a Quincke needle was inserted until contact was made with os over the superior postero-lateral aspect of the pedicular shadow (target area). After negative aspiration for blood, 2 mL of the nerve block solution was injected without difficulty or complication. The needle was removed intact. L4 Medial Branch Nerve Block (MBB): The target area for the L4 medial branch is at the junction of the postero-lateral aspect of the superior articular process and the superior, posterior, and medial edge of the transverse process of L5. Under fluoroscopic guidance, a Quincke needle was inserted until contact was made with os over the superior postero-lateral aspect of the pedicular shadow (target area). After negative aspiration for blood, 2mL of the nerve  block solution was injected without difficulty or complication. The needle was removed intact. L5 Medial Branch Nerve Block (MBB): The target area for the L5 medial branch is at the junction of the postero-lateral aspect of the superior articular process and the superior, posterior, and medial edge of the sacral ala. Under fluoroscopic guidance, a Quincke needle was inserted until contact was made with os over the superior postero-lateral aspect of the pedicular shadow (target area). After negative aspiration for blood,2 mL of the nerve block solution was injected without difficulty or complication. The needle was removed intact.  Nerve block solution: 2 cc of 0.2% Ropivacaine   injected at each level above bilaterally.  The unused portion of the solution was discarded in the proper designated containers. Procedural Needles: 22-gauge, 3.5-inch, Quincke needles used for all levels.  Once the entire procedure was completed, the treated area was cleaned, making sure to leave some of the prepping solution back to take advantage of its long term bactericidal properties.   Illustration of the posterior view of the lumbar spine and the posterior neural structures. Laminae of L2 through S1 are labeled. DPRL5, dorsal primary ramus of L5; DPRS1, dorsal primary ramus of S1; DPR3, dorsal primary ramus of L3; FJ, facet (zygapophyseal) joint L3-L4; I, inferior articular process of L4; LB1, lateral branch of dorsal primary ramus of L1; IAB, inferior articular branches from L3 medial branch (supplies L4-L5 facet joint); IBP, intermediate branch plexus; MB3, medial branch of dorsal primary ramus of L3; NR3, third lumbar nerve root; S, superior articular process of L5; SAB, superior articular branches from L4 (supplies L4-5 facet joint also); TP3, transverse process of L3.  Vitals:   10/28/24 1343 10/28/24 1410 10/28/24 1415  BP: 119/81 130/88 125/78  Pulse: 64    Resp: 18 16 16   Temp: (!) 97.4 F (36.3 C)     TempSrc: Temporal    SpO2: 96% 97% 98%  Weight: 225 lb (102.1 kg)    Height: 5' 8 (1.727 m)        Start Time: 1415 hrs. End Time:  1421 hrs.  Imaging Guidance (Spinal):          Type of Imaging Technique: Fluoroscopy Guidance (Spinal) Indication(s): Assistance in needle guidance and placement for procedures requiring needle placement in or near specific anatomical locations not easily accessible without such assistance. Exposure Time: Please see nurses notes. Contrast: None used. Fluoroscopic Guidance: I was personally present during the use of fluoroscopy. Tunnel Vision Technique used to obtain the best possible view of the target area. Parallax error corrected before commencing the procedure. Direction-depth-direction technique used to introduce the needle under continuous pulsed fluoroscopy. Once target was reached, antero-posterior, oblique, and lateral fluoroscopic projection used confirm needle placement in all planes. Images permanently stored in EMR. Interpretation: No contrast injected. I personally interpreted the imaging intraoperatively. Adequate needle placement confirmed in multiple planes. Permanent images saved into the patient's record.  Antibiotic Prophylaxis:   Anti-infectives (From admission, onward)    None      Indication(s): None identified  Post-operative Assessment:  Post-procedure Vital Signs:  Pulse/HCG Rate: 6463 Temp: (!) 97.4 F (36.3 C) Resp: 16 BP: 125/78 SpO2: 98 %  EBL: None  Complications: No immediate post-treatment complications observed by team, or reported by patient.  Note: The patient tolerated the entire procedure well. A repeat set of vitals were taken after the procedure and the patient was kept under observation following institutional policy, for this type of procedure. Post-procedural neurological assessment was performed, showing return to baseline, prior to discharge. The patient was provided with post-procedure  discharge instructions, including a section on how to identify potential problems. Should any problems arise concerning this procedure, the patient was given instructions to immediately contact us , at any time, without hesitation. In any case, we plan to contact the patient by telephone for a follow-up status report regarding this interventional procedure.  Comments:  No additional relevant information.  Plan of Care  Orders:  Orders Placed This Encounter  Procedures   DG PAIN [facility] C-ARM 1-60 MIN NO REPORT    Intraoperative interpretation by procedural physician at [facility].    Standing Status:   Standing    Number of Occurrences:   1    Reason for exam::   Assistance in needle guidance and placement for procedures requiring needle placement in or near specific anatomical locations not easily accessible without such assistance.   Medications ordered for procedure: Meds ordered this encounter  Medications   lidocaine  (XYLOCAINE ) 2 % (with pres) injection 400 mg   diazepam  (VALIUM ) tablet 5 mg    Make sure Flumazenil is available in the pyxis when using this medication. If oversedation occurs, administer 0.2 mg IV over 15 sec. If after 45 sec no response, administer 0.2 mg again over 1 min; may repeat at 1 min intervals; not to exceed 4 doses (1 mg)   ropivacaine  (PF) 2 mg/mL (0.2%) (NAROPIN ) injection 18 mL   Medications administered: We administered lidocaine , diazepam , and ropivacaine  (PF) 2 mg/mL (0.2%).  See the medical record for exact dosing, route, and time of administration.  Follow-up plan:   Return in about 4 weeks (around 11/25/2024) for PPE, VV, Seema (schedule for RFA).     Recent Visits Date Type Provider Dept  09/28/24 Procedure visit Marcelino Nurse, MD [facility]  09/15/24 Office Visit Patel, Seema K, NP [facility]  Showing recent visits within past 90 days and meeting all other requirements Today's Visits Date Type Provider Dept   10/28/24 Procedure visit Marcelino Nurse, MD [facility]  Showing today's visits and meeting all other requirements Future Appointments Date Type Provider Dept  11/25/24 Appointment Patel, Seema K, NP [facility]  12/07/24 Appointment Patel, Seema K, NP [facility]  Showing future appointments within next 90 days and meeting all other requirements  Disposition: Discharge home  Discharge (Date  Time): 10/28/2024; 1426 hrs.   Primary Care Physician: Bair, Kalpana, MD Location: [facility] Note by: Wallie Sherry, MD Date: 10/28/2024; Time: 2:53 PM  Disclaimer:  Medicine is not an exact science. The only guarantee in medicine is that nothing is guaranteed. It is important to note that the decision to proceed with this intervention was based on the information collected from the patient. The Data and conclusions were drawn from the patient's questionnaire, the interview, and the physical examination. Because the information was provided in large part by the patient, it cannot be guaranteed that it has not been purposely or unconsciously manipulated. Every effort has been made to obtain as much relevant data as possible for this evaluation. It is important to note that the conclusions that lead to this procedure are derived in large part from the available data. Always take into account that the treatment will also be dependent on availability of resources and existing treatment guidelines, considered by other Pain Management Practitioners as being common knowledge and practice, at the time of the intervention. For Medico-Legal purposes, it is also important to point out that variation in procedural techniques and pharmacological choices are the acceptable norm. The indications, contraindications, technique, and results of the above procedure should only be interpreted and judged by a Board-Certified Interventional Pain Specialist with extensive  familiarity and expertise in the same exact procedure and technique.

## 2024-10-29 ENCOUNTER — Telehealth: Payer: Self-pay | Admitting: *Deleted

## 2024-10-29 NOTE — Telephone Encounter (Signed)
 Post procedure call:   no  questions or concerns.

## 2024-11-06 DIAGNOSIS — I10 Essential (primary) hypertension: Secondary | ICD-10-CM

## 2024-11-06 NOTE — Telephone Encounter (Signed)
 Noted

## 2024-11-23 NOTE — Progress Notes (Unsigned)
 PROVIDER NOTE: Interpretation of information contained herein should be left to medically-trained personnel. Specific patient instructions are provided elsewhere under Patient Instructions section of medical record. This document was created in part using AI and STT-dictation technology, any transcriptional errors that may result from this process are unintentional.  Patient: [name]  Service: E/M   PCP: Abbey Bruckner, MD  DOB: 03-Jul-1961  DOS: 11/25/2024  Provider: Emmy MARLA Blanch, NP  MRN: [id number]  Delivery: Virtual Visit  Specialty: Interventional Pain Management  Type: Established Patient  Setting: Ambulatory outpatient facility  Specialty designation: 09  Referring Prov.: Bair, Bruckner, MD  Location: Remote location       Virtual Encounter - Pain Management PROVIDER NOTE: Information contained herein reflects review and annotations entered in association with encounter. Interpretation of such information and data should be left to medically-trained personnel. Information provided to patient can be located elsewhere in the medical record under Patient Instructions. Document created using STT-dictation technology, any transcriptional errors that may result from process are unintentional.    Contact & Pharmacy Preferred: [phone] Home: [phone] (home) Mobile: [phone] (mobile) E-mail: [email or web address]  [facility] - [place], [place] - [address] [place] [place] [address] Phone: [phone] Fax: [phone]   Pre-screening  Ms. Cheatum offered in-person vs virtual encounter. She indicated preferring virtual for this encounter.   Reason COVID-19*  Social distancing based on CDC and AMA recommendations.   I contacted [name] on 11/25/2024 via telephone.      I clearly identified myself as Emmy MARLA Blanch, NP. I verified that I was speaking with the correct person using two identifiers (Name: RITAL CAVEY, and  date of birth: [birth year]).  Consent I sought verbal advanced consent from [name] for virtual visit interactions. I informed Ms. Scoles of possible security and privacy concerns, risks, and limitations associated with providing not-in-person medical evaluation and management services. I also informed Ms. Thompson of the availability of in-person appointments. Finally, I informed her that there would be a charge for the virtual visit and that she could be  personally, fully or partially, financially responsible for it. Ms. Scorsone expressed understanding and agreed to proceed.   Historic Elements   Ms. MEKAILA TARNOW is a 63 y.o. year old, female patient evaluated today after our last contact on 09/15/2024. Ms. Mumpower  has a past medical history of Adult BMI 32.0-32.9 kg/sq m (10/28/2020), Anemia (2011), Arthritis, Arthritis of carpometacarpal [facility]) joint of both thumbs (03/19/2023), Arthritis of wrist (07/04/2011), Bilateral hip pain (10/13/2019), Bilateral primary osteoarthritis of knee (10/13/2019), Bilateral wrist pain (09/21/2021), Cervical radicular pain (left) (12/26/2021), Chronic pain syndrome (01/30/2019), Chronic right shoulder pain (09/27/2020), Chronic SI joint pain (10/13/2019), Degenerative disorder of bone, Dyslipidemia (09/04/2023), Elevated BP without diagnosis of hypertension (09/30/2020), Family history of diabetes mellitus (01/30/2019), Family history of heart disease (01/30/2019), Fever blister (08/21/2023), Fibromyalgia (09/21/2021), Genuine stress incontinence, female (2012), GERD (gastroesophageal reflux disease), History of squamous cell carcinoma of skin (10/28/2020), Hypertension (02/01/2021), Insomnia (01/13/2015), Long-term current use of opiate analgesic (01/02/2018), Lumbar radiculopathy (03/23/2020), Lumbar spondylosis (01/30/2019), Menopause (06/16/2018), Migraines, Obesity, Palpitations (08/21/2023), Prediabetes (02/01/2021), Primary osteoarthritis of right shoulder  (09/27/2020), 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 She also  has a past surgical history that includes Laparoscopic hysterectomy (10/23/2010); Midurethral sling (05/02/2011); excision of vulvar lesion (05/02/2011); Breast biopsy (Left, 10/11/2023);  Abdominal hysterectomy (2011); and Spine surgery. Ms. Jorstad has a current medication list which includes the following prescription(s): gabapentin , hydrocodone -acetaminophen , [START ON 11/30/2024] hydrocodone -acetaminophen , magnesium oxide, melatonin, multivitamin, turmeric, valacyclovir , and valsartan . She  reports that she quit smoking about 21 years ago. Her smoking use included cigarettes. She has never used smokeless tobacco. She reports that she does not currently use alcohol. She reports that she does not use drugs. Ms. Eastland has no known allergies.  BMI: Estimated body mass index is 34.21 kg/m as calculated from the following:   Height as of 10/28/24: 5' 8 (1.727 m).   Weight as of 10/28/24: 225 lb (102.1 kg). Last encounter: 09/15/2024. Last procedure: Visit date not found.  HPI  Today, she is being contacted for a post-procedure assessment.  Procedure:           Anesthesia, Analgesia, Anxiolysis:  Type: Lumbar Facet, Medial Branch Block(s) #3  Primary Purpose: Diagnostic Region: Posterolateral Lumbosacral Spine Level:L3, L4, L5, Medial Branch Level(s). Injecting these levels blocks the L3-4, L4-5, lumbar facet joints. Laterality: Bilateral   Type: Local Anesthesia Anxiolysis: 5 mg PO Valium  Sedation: Meaningful verbal contact was maintained at all times during the procedure  Local Anesthetic: Lidocaine  1-2%   Position: Prone    Indications: 1. Lumbar facet arthropathy   2. Lumbar spondylosis     Pain Score: Pre-procedure: 8 /10 Post-procedure: 8  /10  Post-Procedure Evaluation    Effectiveness:  Initial hour after procedure:   ***. Subsequent 4-6 hours post-procedure:   ***. Analgesia past initial 6 hours:   ***. Ongoing improvement:  Analgesic:  *** Function: {Blank single:19197::No benefit,No improvement,Back to baseline,Transient improvement,Ms. Knighton reports improvement in function,Somewhat improved,Minimal improvement,   ***   } ROM: {Blank single:19197::No benefit,No improvement,Back to baseline,Transient improvement,Ms. Widdowson reports improvement in ROM,Somewhat improved,Minimal improvement,   ***   } Interpretation: ***  Pharmacotherapy Assessment  Monitoring: [place] PMP: PDMP not reviewed this encounter.       Pharmacotherapy: No side-effects or adverse reactions reported. Compliance: No problems identified. Effectiveness: Clinically acceptable. Plan: Refer to POC.  UDS:  Summary  Date Value Ref Range Status  09/15/2024 FINAL  Final    Comment:    ==================================================================== [employer] Select 13 (MW) ==================================================================== Test                             Result       Flag       Units  Drug Present and Declared for Prescription Verification   Hydrocodone                     1302         EXPECTED   ng/mg creat   Hydromorphone                  208          EXPECTED   ng/mg creat   Dihydrocodeine                 240          EXPECTED   ng/mg creat   Norhydrocodone                 1627         EXPECTED   ng/mg creat    Sources of hydrocodone  include scheduled prescription medications.    Hydromorphone, dihydrocodeine and norhydrocodone are expected    metabolites of hydrocodone . Hydromorphone and dihydrocodeine  are    also available as scheduled prescription medications.  ==================================================================== Test                      Result    Flag   Units      Ref  Range   Creatinine              83               mg/dL      >=79 ==================================================================== Declared Medications:  The flagging and interpretation on this report are based on the  following declared medications.  Unexpected results may arise from  inaccuracies in the declared medications.   **Note: The testing scope of this panel includes these medications:   Hydrocodone    **Note: The testing scope of this panel does not include the  following reported medications:   Acetaminophen   Duloxetine   Gabapentin   Magnesium (Mag-Ox)  Melatonin  Multivitamin  Omeprazole  (Prilosec)  Turmeric  Valacyclovir  (Valtrex ) ==================================================================== For clinical consultation, please call [phone]. ====================================================================    No results found for: MABLE OYSTER, [id number]  Laboratory Chemistry Profile   Renal Lab Results  Component Value Date   BUN 10 10/23/2024   CREATININE 0.80 10/23/2024   BCR 11 (L) 11/21/2023   GFR 79.64 10/23/2024   GFRAA 92 06/16/2018   GFRNONAA >60 02/24/2021    Hepatic Lab Results  Component Value Date   AST 20 10/23/2024   ALT 27 10/23/2024   ALBUMIN 4.2 10/23/2024   ALKPHOS 87 10/23/2024    Electrolytes Lab Results  Component Value Date   NA 142 10/23/2024   K 4.5 10/23/2024   CL 106 10/23/2024   CALCIUM 9.6 10/23/2024   MG 1.8 01/30/2019    Bone Lab Results  Component Value Date   VD25OH 54.0 08/21/2023    Inflammation (CRP: Acute Phase) (ESR: Chronic Phase) No results found for: CRP, ESRSEDRATE, LATICACIDVEN       Note: Above Lab results reviewed.  Imaging  DG PAIN CLINIC C-ARM 1-60 MIN NO REPORT Fluoro was used, but no Radiologist interpretation will be provided.  Please refer to NOTES tab for provider progress note.  Assessment  There were no encounter diagnoses.  Plan of Care   Problem-specific:  No problem-specific Assessment & Plan notes found for this encounter.  Ms. TISHANNA DUNFORD has a current medication list which includes the following long-term medication(s): gabapentin  and valsartan .  Pharmacotherapy (Medications Ordered): No orders of the defined types were placed in this encounter.  Orders:  No orders of the defined types were placed in this encounter.  Follow-up plan:   No follow-ups on file.      {There is no content from the last Plan section.}    Recent Visits Date Type Provider Dept  10/28/24 Procedure visit Marcelino Nurse, MD [facility]  09/28/24 Procedure visit Marcelino Nurse, MD [facility]  09/15/24 Office Visit Delbert Vu K, NP [facility]  Showing recent visits within past 90 days and meeting all other requirements Future Appointments Date Type Provider Dept  11/25/24 Appointment Majed Pellegrin K, NP [facility]  12/07/24 Appointment Demon Volante K, NP [facility]  Showing future appointments within next 90 days and meeting all other requirements  I discussed the assessment and treatment plan with the patient. The patient was provided an opportunity to ask questions and all were answered. The patient agreed with the plan and demonstrated an understanding of the  instructions.  Patient advised to call back or seek an in-person evaluation if the symptoms or condition worsens.  Duration of encounter: *** minutes.  Note by: Emmy MARLA Blanch, NP Date: 11/25/2024; Time: 3:44 PM

## 2024-11-25 ENCOUNTER — Ambulatory Visit: Attending: Nurse Practitioner | Admitting: Nurse Practitioner

## 2024-11-25 DIAGNOSIS — M19011 Primary osteoarthritis, right shoulder: Secondary | ICD-10-CM

## 2024-11-25 DIAGNOSIS — M25511 Pain in right shoulder: Secondary | ICD-10-CM

## 2024-11-25 DIAGNOSIS — G894 Chronic pain syndrome: Secondary | ICD-10-CM

## 2024-11-25 DIAGNOSIS — M75101 Unspecified rotator cuff tear or rupture of right shoulder, not specified as traumatic: Secondary | ICD-10-CM

## 2024-11-25 DIAGNOSIS — G8929 Other chronic pain: Secondary | ICD-10-CM

## 2024-11-25 DIAGNOSIS — M47816 Spondylosis without myelopathy or radiculopathy, lumbar region: Secondary | ICD-10-CM

## 2024-11-25 DIAGNOSIS — M12811 Other specific arthropathies, not elsewhere classified, right shoulder: Secondary | ICD-10-CM

## 2024-11-26 ENCOUNTER — Ambulatory Visit (INDEPENDENT_AMBULATORY_CARE_PROVIDER_SITE_OTHER)

## 2024-11-26 ENCOUNTER — Telehealth: Payer: Self-pay

## 2024-11-26 VITALS — BP 128/76 | HR 69 | Temp 98.6°F | Ht 68.0 in | Wt 234.6 lb

## 2024-11-26 DIAGNOSIS — Z6835 Body mass index (BMI) 35.0-35.9, adult: Secondary | ICD-10-CM | POA: Diagnosis not present

## 2024-11-26 DIAGNOSIS — Z1211 Encounter for screening for malignant neoplasm of colon: Secondary | ICD-10-CM

## 2024-11-26 DIAGNOSIS — E66812 Obesity, class 2: Secondary | ICD-10-CM

## 2024-11-26 DIAGNOSIS — I10 Essential (primary) hypertension: Secondary | ICD-10-CM

## 2024-11-26 DIAGNOSIS — Z23 Encounter for immunization: Secondary | ICD-10-CM | POA: Diagnosis not present

## 2024-11-26 MED ORDER — WEGOVY 0.25 MG/0.5ML ~~LOC~~ SOAJ: 0.2500 mg | SUBCUTANEOUS | 0 refills | Status: DC

## 2024-11-26 NOTE — Assessment & Plan Note (Addendum)
 Arthritis limiting ability to exercise. Eating balanced food and trying to stay active.  No h/o medullary thyroid  cancer.  Great candidate for GLP-1 group medication to help with weight loss but concerns for insurance coverage.  Prescription sent for Wegovy 0.25 mg weekly.Sent prescription for [facility] to pharmacy and initiated prior authorization process. Discussed pharmacy referral if she prefers getting medication sent directly through manufacturer.  Consider Wellbutrin as an alternative if Georjean is not affordable. Orders:   semaglutide-weight management (WEGOVY) 0.25 MG/0.5ML SOAJ SQ injection; Inject 0.25 mg into the skin once a week for 28 days.

## 2024-11-26 NOTE — Assessment & Plan Note (Signed)
 Qualifies shingles vaccine. If you are interested in the shingles vaccine series (Shingrix), call your insurance or pharmacy to check on coverage and location it must be given.

## 2024-11-26 NOTE — Patient Instructions (Addendum)
 If you are interested in the shingles vaccine series (Shingrix), call your insurance or pharmacy to check on coverage and location it must be given.  You can get it through your local pharmacy. It is a 2 dose vaccine, if you get one today, second dose will be 2-6 months from the first dose.

## 2024-11-26 NOTE — Telephone Encounter (Signed)
 PA needed for [facility].

## 2024-11-26 NOTE — Assessment & Plan Note (Addendum)
 Blood pressure well-controlled on current regimen.  Continue valsartan  160 mg once daily. Take 2 tab of 80 mg till she runs out of current prescription. Her home BP cuff calibrated in office and is not accurate. Recommend updating home BP cuff (Omron 3).  Schedule appointment to get CMP done.  F/U in 6 months.   Orders:   valsartan  (DIOVAN ) 160 MG tablet; Take 1 tablet (160 mg total) by mouth daily.   Comp Met (CMET); Future

## 2024-11-26 NOTE — Progress Notes (Signed)
 Established Patient Office Visit   Subjective  Patient ID: [name], female    DOB: 1961/02/28  Age: 63 y.o. MRN: [id number]  Chief Complaint  Patient presents with   Hypertension   Weight Loss    Discussed the use of AI scribe software for clinical note transcription with the patient, who gave verbal consent to proceed.  History of Present Illness [name] is a 63 year old female with hypertension and borderline prediabetes, obesity, polyarthritis  who presents for follow-up on her blood pressure management and weight loss.  - Hypertension:  She was started on Valsartan  80 mg daily on 10/23/24 however due to elevated blood pressure she was recommended to increase Valsartan  to 160 mg (2 tablets of 80 mg) daily on 11/06/24.   - Elevated LDL, obesity, polyarthritis, fibromyalgia: She eats healthy diet. Stays active however, lumbar, knee pain restricts the kind of exercises she can do.  She has not noticed any weight changes since her last visit with me in November. She continues to follow up with pain clinic for pain management.   She is interested in weight loss medication. She does not have h/o medullary thyroid  cancer, previous eye exam has been normal.   - Snoring: Previously underwent a sleep apnea evaluation, which she found inconclusive due to difficulty sleeping during the test. She experiences variable sleep quality, sometimes feeling rested and other times not, despite taking gabapentin , magnesium, and melatonin at bedtime.  - She is considering the shingles vaccine, as she has never had shingles. She is also contemplating a colonoscopy or Cologuard test, as her last colonoscopy was in 2011 and she has no family history of colon cancer.    ROS As per HPI    Objective:     BP 128/76 (BP Location: Right Arm, Patient Position: Sitting, Cuff Size: Normal)   Pulse 69   Temp 98.6 F (37 C) (Oral)   Ht 5' 8 (1.727 m)   Wt 234 lb 9.6 oz (106.4 kg)   SpO2  97%   BMI 35.67 kg/m      11/26/2024    3:54 PM 10/28/2024    1:48 PM 10/23/2024   10:42 AM  Depression screen PHQ 2/9  Decreased Interest 0 0 0  Down, Depressed, Hopeless 0 0 0  PHQ - 2 Score 0 0 0  Altered sleeping 1  1  Tired, decreased energy 1  2  Change in appetite 0  0  Feeling bad or failure about yourself  0  0  Trouble concentrating 0  0  Moving slowly or fidgety/restless 0  0  Suicidal thoughts 0  0  PHQ-9 Score 2  3  Difficult doing work/chores Not difficult at all  Somewhat difficult      11/26/2024    3:55 PM 10/23/2024   10:42 AM 10/28/2020   10:53 AM  GAD 7 : Generalized Anxiety Score  Nervous, Anxious, on Edge 0 0 1  Control/stop worrying 0 0 0  Worry too much - different things 0 0 1  Trouble relaxing 0 1 0  Restless 0 0 1  Easily annoyed or irritable 0 0 1  Afraid - awful might happen 0 0 1  Total GAD 7 Score 0 1 5  Anxiety Difficulty Not difficult at all Not difficult at all Not difficult at all      11/26/2024    3:54 PM 10/28/2024    1:48 PM 10/23/2024   10:42 AM  Depression screen PHQ  2/9  Decreased Interest 0 0 0  Down, Depressed, Hopeless 0 0 0  PHQ - 2 Score 0 0 0  Altered sleeping 1  1  Tired, decreased energy 1  2  Change in appetite 0  0  Feeling bad or failure about yourself  0  0  Trouble concentrating 0  0  Moving slowly or fidgety/restless 0  0  Suicidal thoughts 0  0  PHQ-9 Score 2  3  Difficult doing work/chores Not difficult at all  Somewhat difficult      11/26/2024    3:55 PM 10/23/2024   10:42 AM 10/28/2020   10:53 AM  GAD 7 : Generalized Anxiety Score  Nervous, Anxious, on Edge 0 0 1  Control/stop worrying 0 0 0  Worry too much - different things 0 0 1  Trouble relaxing 0 1 0  Restless 0 0 1  Easily annoyed or irritable 0 0 1  Afraid - awful might happen 0 0 1  Total GAD 7 Score 0 1 5  Anxiety Difficulty Not difficult at all Not difficult at all Not difficult at all   SDOH Screenings   Food Insecurity:  Food Insecurity Present (10/21/2024)  Housing: High Risk (10/21/2024)  Transportation Needs: No Transportation Needs (10/21/2024)  Depression (PHQ2-9): Low Risk (11/26/2024)  Financial Resource Strain: Medium Risk (10/21/2024)  Physical Activity: Insufficiently Active (10/21/2024)  Social Connections: Moderately Isolated (10/21/2024)  Stress: No Stress Concern Present (10/21/2024)  Tobacco Use: Medium Risk (11/26/2024)     Physical Exam Constitutional:      General: She is not in acute distress.    Appearance: She is obese.  HENT:     Head: Normocephalic and atraumatic.  Cardiovascular:     Rate and Rhythm: Normal rate.  Pulmonary:     Effort: Pulmonary effort is normal.     Breath sounds: Normal breath sounds.  Abdominal:     Palpations: Abdomen is soft.     Tenderness: There is no guarding.  Musculoskeletal:     Right lower leg: No edema.     Left lower leg: No edema.  Neurological:     Mental Status: She is alert and oriented to person, place, and time.  Psychiatric:        Mood and Affect: Mood normal.        No results found for any visits on 11/26/24.  The 10-year ASCVD risk score (Arnett DK, et al., 2019) is: 5.7%     Assessment & Plan:   Assessment & Plan Class 2 severe obesity with serious comorbidity and body mass index (BMI) of 35.0 to 35.9 in adult, unspecified obesity type Arthritis limiting ability to exercise. Eating balanced food and trying to stay active.  No h/o medullary thyroid  cancer.  Great candidate for GLP-1 group medication to help with weight loss but concerns for insurance coverage.  Prescription sent for Wegovy 0.25 mg weekly.Sent prescription for [facility] to pharmacy and initiated prior authorization process. Discussed pharmacy referral if she prefers getting medication sent directly through manufacturer.  Consider Wellbutrin as an alternative if Georjean is not affordable. Orders:   semaglutide-weight management (WEGOVY) 0.25 MG/0.5ML SOAJ SQ  injection; Inject 0.25 mg into the skin once a week for 28 days.  Primary hypertension Blood pressure well-controlled on current regimen.  Continue valsartan  160 mg once daily. Take 2 tab of 80 mg till she runs out of current prescription. Her home BP cuff calibrated in office and is not accurate. Recommend updating home BP  cuff (Omron 3).  Schedule appointment to get CMP done.  F/U in 6 months.   Orders:   valsartan  (DIOVAN ) 160 MG tablet; Take 1 tablet (160 mg total) by mouth daily.   Comp Met (CMET); Future  Screening for colon cancer Colonoscopy is more definitive for screening and prevention of colorectal cancer. Cologuard is more convenient, no previous h/o polyps or family h/o colorectal cancer.  Ordered Cologuard for colon cancer screening.  Orders:   Cologuard  Need for shingles vaccine Qualifies shingles vaccine. If you are interested in the shingles vaccine series (Shingrix), call your insurance or pharmacy to check on coverage and location it must be given.        Return in about 6 months (around 05/27/2025) for Lab on 12/22, non fasting, follow up with Dr. Abbey in 6 months .   Luke Abbey, MD

## 2024-11-26 NOTE — Assessment & Plan Note (Addendum)
 Colonoscopy is more definitive for screening and prevention of colorectal cancer. Cologuard is more convenient, no previous h/o polyps or family h/o colorectal cancer.  Ordered Cologuard for colon cancer screening.  Orders:   Cologuard

## 2024-11-27 ENCOUNTER — Other Ambulatory Visit (HOSPITAL_COMMUNITY): Payer: Self-pay

## 2024-11-27 NOTE — Telephone Encounter (Signed)
 PA request has been Started.

## 2024-11-30 ENCOUNTER — Telehealth: Payer: Self-pay

## 2024-11-30 ENCOUNTER — Other Ambulatory Visit (HOSPITAL_COMMUNITY): Payer: Self-pay

## 2024-11-30 NOTE — Telephone Encounter (Signed)
 Pharmacy Patient Advocate Encounter   Received notification from Physician's Office that prior authorization for Wegovy  0.25 mg/0.5 ml is required/requested.   Insurance verification completed.   The patient is insured through [facility].   Per test claim: Per test claim, medication is not covered due to plan/benefit exclusion, PA not submitted at this time

## 2024-11-30 NOTE — Telephone Encounter (Signed)
 Noted.  Vanessa Mascot, MD

## 2024-11-30 NOTE — Telephone Encounter (Signed)
 Noted

## 2024-11-30 NOTE — Telephone Encounter (Signed)
 Pharmacy Patient Advocate Encounter   Received notification from Physician's Office that prior authorization for Wegovy  0.25 mg/0.5 ml is required/requested.   Insurance verification completed.   The patient is insured through [facility].   Per test claim: PA required; PA submitted to above mentioned insurance via Latent Key/confirmation #/EOC [id number] Status is pending

## 2024-12-03 NOTE — Progress Notes (Unsigned)
 PROVIDER NOTE: Interpretation of information contained herein should be left to medically-trained personnel. Specific patient instructions are provided elsewhere under Patient Instructions section of medical record. This document was created in part using AI and STT-dictation technology, any transcriptional errors that may result from this process are unintentional.  Patient: [name]  Service: E/M   PCP: Abbey Bruckner, MD  DOB: 01-19-1961  DOS: 12/07/2024  Provider: Emmy MARLA Blanch, NP  MRN: [id number]  Delivery: Face-to-face  Specialty: Interventional Pain Management  Type: Established Patient  Setting: Ambulatory outpatient facility  Specialty designation: 09  Referring Prov.: No ref. provider found  Location: Outpatient office facility       History of present illness (HPI) [name], a 63 y.o. year old female, is here today because of [name] Chronic pain syndrome [G89.4]. [name] primary complain today is No chief complaint on file.  Pertinent problems: [name] has Obesity; GERD (gastroesophageal reflux disease); Chronic pain syndrome; Lumbar spondylosis; Bilateral primary osteoarthritis of knee; Bilateral hip pain; Chronic SI joint pain; Lumbar radiculopathy; Long-term current use of opiate analgesic; Right rotator cuff tear arthropathy; Primary osteoarthritis of right shoulder; Chronic right shoulder pain; Fibromyalgia; Bilateral wrist pain; Cervical radicular pain (left); Arthritis of carpometacarpal (CMC) joint of both thumbs; and Primary osteoarthritis involving multiple joints on their pertinent problem list.  Pain Assessment: Severity of   is reported as a  /10. Location:    / . Onset:  . Quality:  . Timing:  . Modifying factor(s):  SABRA Vitals:  vitals were not taken for this visit.  BMI: Estimated body mass index is 35.67 kg/m as calculated from the following:   Height as of 11/26/24: 5' 8 (1.727 m).   Weight as of 11/26/24: 234 lb 9.6 oz (106.4 kg).  Last  encounter: 11/25/2024. Last procedure: 11/25/2024.  Reason for encounter:  *** .   Discussed the use of AI scribe software for clinical note transcription with the patient, who gave verbal consent to proceed.  History of Present Illness           Pharmacotherapy Assessment   Hydrocodone -acetaminophen  (Norco) 7.5-325 mg tablet every 8 hours as needed for moderate pain. MME=22.50 Gabapentin  (600 mg total) by mouth at bedtime. 300 mg q day, 600 mg at bedtime  Monitoring: [place] PMP: PDMP reviewed during this encounter.       Pharmacotherapy: No side-effects or adverse reactions reported. Compliance: No problems identified. Effectiveness: Clinically acceptable.  No notes on file  UDS:  Summary  Date Value Ref Range Status  09/15/2024 FINAL  Final    Comment:    ==================================================================== [employer] [facility] (MW) ==================================================================== Test                             Result       Flag       Units  Drug Present and Declared for Prescription Verification   Hydrocodone                     1302         EXPECTED   ng/mg creat   Hydromorphone                  208          EXPECTED   ng/mg creat   Dihydrocodeine                 240  EXPECTED   ng/mg creat   Norhydrocodone                 1627         EXPECTED   ng/mg creat    Sources of hydrocodone  include scheduled prescription medications.    Hydromorphone, dihydrocodeine and norhydrocodone are expected    metabolites of hydrocodone . Hydromorphone and dihydrocodeine are    also available as scheduled prescription medications.  ==================================================================== Test                      Result    Flag   Units      Ref Range   Creatinine              83               mg/dL      >=79 ==================================================================== Declared Medications:  The flagging and interpretation  on this report are based on the  following declared medications.  Unexpected results may arise from  inaccuracies in the declared medications.   **Note: The testing scope of this panel includes these medications:   Hydrocodone    **Note: The testing scope of this panel does not include the  following reported medications:   Acetaminophen   Duloxetine   Gabapentin   Magnesium (Mag-Ox)  Melatonin  Multivitamin  Omeprazole  (Prilosec)  Turmeric  Valacyclovir  (Valtrex ) ==================================================================== For clinical consultation, please call [phone]. ====================================================================     No results found for: CBDTHCR No results found for: D8THCCBX No results found for: D9THCCBX  ROS  Constitutional: Denies any fever or chills Gastrointestinal: No reported hemesis, hematochezia, vomiting, or acute GI distress Musculoskeletal: Denies any acute onset joint swelling, redness, loss of ROM, or weakness Neurological: No reported episodes of acute onset apraxia, aphasia, dysarthria, agnosia, amnesia, paralysis, loss of coordination, or loss of consciousness  Medication Review  HYDROcodone -acetaminophen , Melatonin, Turmeric, gabapentin , magnesium oxide, multivitamin, semaglutide -weight management, valACYclovir , and valsartan   History Review  Allergy: [name] has no known allergies. Drug: [name]  reports no history of drug use. Alcohol:  reports that she does not currently use alcohol. Tobacco:  reports that she quit smoking about 21 years ago. [name] smoking use included cigarettes. She has never used smokeless tobacco. Social: [name]  reports that she quit smoking about 21 years ago. [name] smoking use included cigarettes. She has never used smokeless tobacco. She reports that she does not currently use alcohol. She reports that she does not use drugs. Medical:  has a past medical history of Adult BMI  32.0-32.9 kg/sq m (10/28/2020), Anemia (2011), Arthritis, Arthritis of carpometacarpal [facility]) joint of both thumbs (03/19/2023), Arthritis of wrist (07/04/2011), Bilateral hip pain (10/13/2019), Bilateral primary osteoarthritis of knee (10/13/2019), Bilateral wrist pain (09/21/2021), Cervical radicular pain (left) (12/26/2021), Chronic pain syndrome (01/30/2019), Chronic right shoulder pain (09/27/2020), Chronic SI joint pain (10/13/2019), Degenerative disorder of bone, Dyslipidemia (09/04/2023), Elevated BP without diagnosis of hypertension (09/30/2020), Family history of diabetes mellitus (01/30/2019), Family history of heart disease (01/30/2019), Fever blister (08/21/2023), Fibromyalgia (09/21/2021), Genuine stress incontinence, female (2012), GERD (gastroesophageal reflux disease), History of squamous cell carcinoma of skin (10/28/2020), Hypertension (02/01/2021), Insomnia (01/13/2015), Long-term current use of opiate analgesic (01/02/2018), Lumbar radiculopathy (03/23/2020), Lumbar spondylosis (01/30/2019), Menopause (06/16/2018), Migraines, Obesity, Palpitations (08/21/2023), Prediabetes (02/01/2021), Primary osteoarthritis of right shoulder (09/27/2020), 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 Surgical: Ms.  Huynh  has a past surgical history that includes Laparoscopic hysterectomy (10/23/2010); Midurethral sling (05/02/2011); excision of vulvar lesion (05/02/2011); Breast biopsy (Left, 10/11/2023); Abdominal hysterectomy (2011); and Spine surgery. Family: family history includes Arthritis in [name] maternal grandmother and mother; Cancer in [name] father and paternal grandfather; Diabetes in [name] son; Heart attack in [name] maternal grandfather, mother, and paternal grandmother; Hypertension in [name] mother; Intellectual disability in [name]  sister.  Laboratory Chemistry Profile   Renal Lab Results  Component Value Date   BUN 10 10/23/2024   CREATININE 0.80 10/23/2024   BCR 11 (L) 11/21/2023   GFR 79.64 10/23/2024   GFRAA 92 06/16/2018   GFRNONAA >60 02/24/2021    Hepatic Lab Results  Component Value Date   AST 20 10/23/2024   ALT 27 10/23/2024   ALBUMIN 4.2 10/23/2024   ALKPHOS 87 10/23/2024    Electrolytes Lab Results  Component Value Date   NA 142 10/23/2024   K 4.5 10/23/2024   CL 106 10/23/2024   CALCIUM 9.6 10/23/2024   MG 1.8 01/30/2019    Bone Lab Results  Component Value Date   VD25OH 54.0 08/21/2023    Inflammation (CRP: Acute Phase) (ESR: Chronic Phase) No results found for: CRP, ESRSEDRATE, LATICACIDVEN       Note: Above Lab results reviewed.  Recent Imaging Review  DG PAIN CLINIC C-ARM 1-60 MIN NO REPORT Fluoro was used, but no Radiologist interpretation will be provided.  Please refer to NOTES tab for provider progress note. Note: Reviewed        Physical Exam  Vitals: There were no vitals taken for this visit. BMI: Estimated body mass index is 35.67 kg/m as calculated from the following:   Height as of 11/26/24: 5' 8 (1.727 m).   Weight as of 11/26/24: 234 lb 9.6 oz (106.4 kg). Ideal: Ideal body weight: 63.9 kg (140 lb 14 oz) Adjusted ideal body weight: 80.9 kg (178 lb 5.8 oz) General appearance: Well nourished, well developed, and well hydrated. In no apparent acute distress Mental status: Alert, oriented x 3 (person, place, & time)       Respiratory: No evidence of acute respiratory distress Eyes: PERLA   Assessment   Diagnosis Status  1. Chronic pain syndrome   2. Lumbar spondylosis   3. Bilateral primary osteoarthritis of knee   4. Bilateral hip pain   5. Chronic SI joint pain   6. Lumbar radiculopathy   7. Long-term current use of opiate analgesic   8. Right rotator cuff tear arthropathy   9. Primary osteoarthritis of right shoulder   10. Chronic right  shoulder pain    Controlled Controlled Controlled   Updated Problems: No problems updated.  Plan of Care  Problem-specific:  Assessment and Plan            [name] has a current medication list which includes the following long-term medication(s): gabapentin  and valsartan .  Pharmacotherapy (Medications Ordered): No orders of the defined types were placed in this encounter.  Orders:  No orders of the defined types were placed in this encounter.    {There is no content from the last Plan section.}   No follow-ups on file.    Recent Visits Date Type Provider Dept  11/25/24 Office Visit Anjelique Makar K, NP [facility]  10/28/24 Procedure visit Marcelino Nurse, MD [facility]  09/28/24 Procedure visit Marcelino Nurse, MD [facility]  09/15/24 Office Visit Josephmichael Lisenbee K, NP [facility]  Showing  recent visits within past 90 days and meeting all other requirements Future Appointments Date Type Provider Dept  12/07/24 Appointment Roshaun Pound K, NP [facility]  Showing future appointments within next 90 days and meeting all other requirements  I discussed the assessment and treatment plan with the patient. The patient was provided an opportunity to ask questions and all were answered. The patient agreed with the plan and demonstrated an understanding of the instructions.  Patient advised to call back or seek an in-person evaluation if the symptoms or condition worsens.  Duration of encounter: *** minutes.  Total time on encounter, as per AMA guidelines included both the face-to-face and non-face-to-face time personally spent by the physician and/or other qualified health care professional(s) on the day of the encounter (includes time in activities that require the physician or other qualified health care professional and does not include time in activities normally performed by clinical staff). Physician's time may  include the following activities when performed: Preparing to see the patient (e.g., pre-charting review of records, searching for previously ordered imaging, lab work, and nerve conduction tests) Review of prior analgesic pharmacotherapies. Reviewing PMP Interpreting ordered tests (e.g., lab work, imaging, nerve conduction tests) Performing post-procedure evaluations, including interpretation of diagnostic procedures Obtaining and/or reviewing separately obtained history Performing a medically appropriate examination and/or evaluation Counseling and educating the patient/family/caregiver Ordering medications, tests, or procedures Referring and communicating with other health care professionals (when not separately reported) Documenting clinical information in the electronic or other health record Independently interpreting results (not separately reported) and communicating results to the patient/ family/caregiver Care coordination (not separately reported)  Note by: Kevionna Heffler K Marcellous Snarski, NP (TTS and AI technology used. I apologize for any typographical errors that were not detected and corrected.) Date: 12/07/2024; Time: 8:08 AM

## 2024-12-07 ENCOUNTER — Encounter: Payer: Self-pay | Admitting: Nurse Practitioner

## 2024-12-07 ENCOUNTER — Other Ambulatory Visit

## 2024-12-07 ENCOUNTER — Ambulatory Visit: Attending: Nurse Practitioner | Admitting: Nurse Practitioner

## 2024-12-07 VITALS — BP 134/87 | HR 94 | Temp 99.0°F | Resp 20 | Ht 68.0 in | Wt 220.0 lb

## 2024-12-07 DIAGNOSIS — G8929 Other chronic pain: Secondary | ICD-10-CM | POA: Diagnosis present

## 2024-12-07 DIAGNOSIS — M47816 Spondylosis without myelopathy or radiculopathy, lumbar region: Secondary | ICD-10-CM | POA: Insufficient documentation

## 2024-12-07 DIAGNOSIS — I10 Essential (primary) hypertension: Secondary | ICD-10-CM | POA: Diagnosis not present

## 2024-12-07 DIAGNOSIS — M19011 Primary osteoarthritis, right shoulder: Secondary | ICD-10-CM | POA: Diagnosis not present

## 2024-12-07 DIAGNOSIS — G894 Chronic pain syndrome: Secondary | ICD-10-CM | POA: Insufficient documentation

## 2024-12-07 DIAGNOSIS — Z79891 Long term (current) use of opiate analgesic: Secondary | ICD-10-CM | POA: Insufficient documentation

## 2024-12-07 DIAGNOSIS — M25552 Pain in left hip: Secondary | ICD-10-CM | POA: Diagnosis not present

## 2024-12-07 DIAGNOSIS — M5416 Radiculopathy, lumbar region: Secondary | ICD-10-CM | POA: Insufficient documentation

## 2024-12-07 DIAGNOSIS — M533 Sacrococcygeal disorders, not elsewhere classified: Secondary | ICD-10-CM | POA: Insufficient documentation

## 2024-12-07 DIAGNOSIS — M12811 Other specific arthropathies, not elsewhere classified, right shoulder: Secondary | ICD-10-CM | POA: Diagnosis present

## 2024-12-07 DIAGNOSIS — M75101 Unspecified rotator cuff tear or rupture of right shoulder, not specified as traumatic: Secondary | ICD-10-CM | POA: Insufficient documentation

## 2024-12-07 DIAGNOSIS — M170 Bilateral primary osteoarthritis of knee: Secondary | ICD-10-CM | POA: Diagnosis not present

## 2024-12-07 DIAGNOSIS — M25551 Pain in right hip: Secondary | ICD-10-CM | POA: Insufficient documentation

## 2024-12-07 DIAGNOSIS — M25511 Pain in right shoulder: Secondary | ICD-10-CM | POA: Insufficient documentation

## 2024-12-07 LAB — COMPREHENSIVE METABOLIC PANEL WITH GFR
ALT: 32 U/L (ref 3–35)
AST: 23 U/L (ref 5–37)
Albumin: 4.2 g/dL (ref 3.5–5.2)
Alkaline Phosphatase: 88 U/L (ref 39–117)
BUN: 14 mg/dL (ref 6–23)
CO2: 28 meq/L (ref 19–32)
Calcium: 9.8 mg/dL (ref 8.4–10.5)
Chloride: 105 meq/L (ref 96–112)
Creatinine, Ser: 0.9 mg/dL (ref 0.40–1.20)
GFR: 69.09 mL/min
Glucose, Bld: 92 mg/dL (ref 70–99)
Potassium: 4.3 meq/L (ref 3.5–5.1)
Sodium: 140 meq/L (ref 135–145)
Total Bilirubin: 0.5 mg/dL (ref 0.2–1.2)
Total Protein: 7 g/dL (ref 6.0–8.3)

## 2024-12-07 MED ORDER — HYDROCODONE-ACETAMINOPHEN 7.5-325 MG PO TABS: 1.0000 | ORAL_TABLET | Freq: Three times a day (TID) | ORAL | 0 refills | Status: AC | PRN

## 2024-12-07 NOTE — Patient Instructions (Signed)
 " ______________________________________________________________________    Opioid Pain Medication Update  To: All patients taking opioid pain medications. (I.e.: hydrocodone , hydromorphone, oxycodone, oxymorphone, morphine, codeine, methadone, tapentadol, tramadol, buprenorphine, fentanyl , etc.)  Re: Updated review of side effects and adverse reactions of opioid analgesics, as well as new information about long term effects of this class of medications.  Direct risks of long-term opioid therapy are not limited to opioid addiction and overdose. Potential medical risks include serious fractures, breathing problems during sleep, hyperalgesia, immunosuppression, chronic constipation, bowel obstruction, myocardial infarction, and tooth decay secondary to xerostomia.  Unpredictable adverse effects that can occur even if you take your medication correctly: Cognitive impairment, respiratory depression, and death. Most people think that if they take their medication correctly, and as instructed, that they will be safe. Nothing could be farther from the truth. In reality, a significant amount of recorded deaths associated with the use of opioids has occurred in individuals that had taken the medication for a long time, and were taking their medication correctly. The following are examples of how this can happen: Patient taking his/her medication for a long time, as instructed, without any side effects, is given a certain antibiotic or another unrelated medication, which in turn triggers a Drug-to-drug interaction leading to disorientation, cognitive impairment, impaired reflexes, respiratory depression or an untoward event leading to serious bodily harm or injury, including death.  Patient taking his/her medication for a long time, as instructed, without any side effects, develops an acute impairment of liver and/or kidney function. This will lead to a rapid inability of the body to breakdown and eliminate  their pain medication, which will result in effects similar to an overdose, but with the same medicine and dose that they had always taken. This again may lead to disorientation, cognitive impairment, impaired reflexes, respiratory depression or an untoward event leading to serious bodily harm or injury, including death.  A similar problem will occur with patients as they grow older and their liver and kidney function begins to decrease as part of the aging process.  Background information: Historically, the original case for using long-term opioid therapy to treat chronic noncancer pain was based on safety assumptions that subsequent experience has called into question. In 1996, the American Pain Society and the American Academy of Pain Medicine issued a consensus statement supporting long-term opioid therapy. This statement acknowledged the dangers of opioid prescribing but concluded that the risk for addiction was low; respiratory depression induced by opioids was short-lived, occurred mainly in opioid-naive patients, and was antagonized by pain; tolerance was not a common problem; and efforts to control diversion should not constrain opioid prescribing. This has now proven to be wrong. Experience regarding the risks for opioid addiction, misuse, and overdose in community practice has failed to support these assumptions.  According to the Centers for Disease Control and Prevention, fatal overdoses involving opioid analgesics have increased sharply over the past decade. Currently, more than 96,700 people die from drug overdoses every year. Opioids are a factor in 7 out of every 10 overdose deaths. Deaths from drug overdose have surpassed motor vehicle accidents as the leading cause of death for individuals between the ages of 76 and 86.  Clinical data suggest that neuroendocrine dysfunction may be very common in both men and women, potentially causing hypogonadism, erectile dysfunction, infertility,  decreased libido, osteoporosis, and depression. Recent studies linked higher opioid dose to increased opioid-related mortality. Controlled observational studies reported that long-term opioid therapy may be associated with increased risk for cardiovascular events.  Subsequent meta-analysis concluded that the safety of long-term opioid therapy in elderly patients has not been proven.   Side Effects and adverse reactions: Common side effects: Drowsiness (sedation). Dizziness. Nausea and vomiting. Constipation. Physical dependence -- Dependence often manifests with withdrawal symptoms when opioids are discontinued or decreased. Tolerance -- As you take repeated doses of opioids, you require increased medication to experience the same effect of pain relief. Respiratory depression -- This can occur in healthy people, especially with higher doses. However, people with COPD, asthma or other lung conditions may be even more susceptible to fatal respiratory impairment.  Uncommon side effects: An increased sensitivity to feeling pain and extreme response to pain (hyperalgesia). Chronic use of opioids can lead to this. Delayed gastric emptying (the process by which the contents of your stomach are moved into your small intestine). Muscle rigidity. Immune system and hormonal dysfunction. Quick, involuntary muscle jerks (myoclonus). Arrhythmia. Itchy skin (pruritus). Dry mouth (xerostomia).  Long-term side effects: Chronic constipation. Sleep-disordered breathing (SDB). Increased risk of bone fractures. Hypothalamic-pituitary-adrenal dysregulation. Increased risk of overdose.  RISKS: Respiratory depression and death: Opioids increase the risk of respiratory depression and death.  Drug-to-drug interactions: Opioids are relatively contraindicated in combination with benzodiazepines, sleep inducers, and other central nervous system depressants. Other classes of medications (i.e.: certain antibiotics  and even over-the-counter medications) may also trigger or induce respiratory depression in some patients.  Medical conditions: Patients with pre-existing respiratory problems are at higher risk of respiratory failure and/or depression when in combination with opioid analgesics. Opioids are relatively contraindicated in some medical conditions such as central sleep apnea.   Fractures and Falls:  Opioids increase the risk and incidence of falls. This is of particular importance in elderly patients.  Endocrine System:  Long-term administration is associated with endocrine abnormalities (endocrinopathies). (Also known as Opioid-induced Endocrinopathy) Influences on both the hypothalamic-pituitary-adrenal axis?and the hypothalamic-pituitary-gonadal axis have been demonstrated with consequent hypogonadism and adrenal insufficiency in both sexes. Hypogonadism and decreased levels of dehydroepiandrosterone sulfate have been reported in men and women. Endocrine effects include: Amenorrhoea in women (abnormal absence of menstruation) Reduced libido in both sexes Decreased sexual function Erectile dysfunction in men Hypogonadisms (decreased testicular function with shrinkage of testicles) Infertility Depression and fatigue Loss of muscle mass Anxiety Depression Immune suppression Hyperalgesia Weight gain Anemia Osteoporosis Patients (particularly women of childbearing age) should avoid opioids. There is insufficient evidence to recommend routine monitoring of asymptomatic patients taking opioids in the long-term for hormonal deficiencies.  Immune System: Human studies have demonstrated that opioids have an immunomodulating effect. These effects are mediated via opioid receptors both on immune effector cells and in the central nervous system. Opioids have been demonstrated to have adverse effects on antimicrobial response and anti-tumour surveillance. Buprenorphine has been demonstrated to have  no impact on immune function.  Opioid Induced Hyperalgesia: Human studies have demonstrated that prolonged use of opioids can lead to a state of abnormal pain sensitivity, sometimes called opioid induced hyperalgesia (OIH). Opioid induced hyperalgesia is not usually seen in the absence of tolerance to opioid analgesia. Clinically, hyperalgesia may be diagnosed if the patient on long-term opioid therapy presents with increased pain. This might be qualitatively and anatomically distinct from pain related to disease progression or to breakthrough pain resulting from development of opioid tolerance. Pain associated with hyperalgesia tends to be more diffuse than the pre-existing pain and less defined in quality. Management of opioid induced hyperalgesia requires opioid dose reduction.  Cancer: Chronic opioid therapy has been associated with an increased  risk of cancer among noncancer patients with chronic pain. This association was more evident in chronic strong opioid users. Chronic opioid consumption causes significant pathological changes in the small intestine and colon. Epidemiological studies have found that there is a link between opium dependence and initiation of gastrointestinal cancers. Cancer is the second leading cause of death after cardiovascular disease. Chronic use of opioids can cause multiple conditions such as GERD, immunosuppression and renal damage as well as carcinogenic effects, which are associated with the incidence of cancers.   Mortality: Long-term opioid use has been associated with increased mortality among patients with chronic non-cancer pain (CNCP).  Prescription of long-acting opioids for chronic noncancer pain was associated with a significantly increased risk of all-cause mortality, including deaths from causes other than overdose.  Reference: Von Korff M, Kolodny A, Deyo RA, Chou R. Long-term opioid therapy reconsidered. Ann Intern Med. 2011 Sep 6;155(5):325-8. doi:  10.7326/[id number]. PMID: 78106373; PMCID: [id number]. Kit JINNY Laurence CINDERELLA Pearley JINNY, Hayward RA, Dunn KM, Jordan KP. Risk of adverse events in patients prescribed long-term opioids: A cohort study in the [place] Clinical Practice Research [employer]. Eur J Pain. 2019 May;23(5):908-922. doi: 10.1002/ejp.1357. Epub 2019 Jan 31. PMID: 69379883. Colameco S, Coren JS, Ciervo CA. Continuous opioid treatment for chronic noncancer pain: a time for moderation in prescribing. Postgrad Med. 2009 Jul;121(4):61-6. doi: 10.3810/pgm.[id number]. PMID: 80358728. Gigi JONELLE Shlomo MILUS Levern IVER Conny RN, [place], Blazina I, Lonell DASEN, Bougatsos C, Deyo RA. The effectiveness and risks of long-term opioid therapy for chronic pain: a systematic review for a [employer] of [employer] Pathways to [employer]. Ann Intern Med. 2015 Feb 17;162(4):276-86. doi: 10.7326/M14-2559. PMID: 74418742. Rory CHRISTELLA Laurence [facility], [place] DM. NCHS Data Brief No. 22. [place]: Centers for Disease Control and Prevention; 2009. Sep, Increase in Fatal Poisonings Involving Opioid Analgesics in the [place] , 1999-2006. Song IA, Choi HR, Oh TK. Long-term opioid use and mortality in patients with chronic non-cancer pain: Ten-year follow-up study in [place] from 2010 through 2019. EClinicalMedicine. 2022 Jul 18;51:101558. doi: 10.1016/j.eclinm.[id number]. PMID: 64124182; PMCID: [id number]. Huser, W., Schubert, T., Vogelmann, T. et al. All-cause mortality in patients with long-term opioid therapy compared with non-opioid analgesics for chronic non-cancer pain: a database study. BMC Med 18, 162 (2020). [email or web address].info/ Rashidian H, Zendehdel K, Kamangar F, Malekzadeh R, Haghdoost AA. An Ecological Study of the Association between Opiate Use and Incidence of Cancers. Addict Health. 2016 Fall;8(4):252-260. PMID: 71180443; PMCID: [id number].  Our Goal: Our goal is to control your pain with means other  than the use of opioid pain medications.  Our Recommendation: Talk to your physician about coming off of these medications. We can assist you with the tapering down and stopping these medicines. Based on the new information, even if you cannot completely stop the medication, a decrease in the dose may be associated with a lesser risk. Ask for other means of controlling the pain. Decrease or eliminate those factors that significantly contribute to your pain such as smoking, obesity, and a diet heavily tilted towards inflammatory nutrients.  Last Updated: 06/24/2023   ______________________________________________________________________       ______________________________________________________________________    Update on Controlled Substance (Opioid) Regulations   To: All patients taking opioid pain medications. (I.e.: hydrocodone , hydromorphone, oxycodone, oxymorphone, morphine, codeine, methadone, tapentadol, tramadol, buprenorphine, fentanyl , etc.)  Re: Review on the state of controlled substance regulations.  Introduction: Rules and regulations associated with all aspects of controlled substances are constantly being modified. Unfortunately we have encountered patients questioning the veracity of the  information that we provide them about these changes. This is intended to provide them with appropriate references and a historical review of these changes.  A Brief History: As of October 01, 2016, the [place]  Government declared the opioid epidemic a public health emergency. Prescription drug monitoring programs (PDMPs) and the [facility] All Schedules Prescription Electronic Reporting Act (NASPER). Before 1800, clinicians regarded pain as an existential phenomenon, a consequence of aging. There was no regulation on the use of cocaine and opioids, resulting in widespread marketing and prescribing for many ailments ranging from diarrhea to toothache. The [employer] of  [phone], passed in response to the sudden emergence of street heroin abuse as well as iatrogenic morphine dependence, influenced both physician and patient alike to avoid opiates. Patients with unexplained pain in the 1920s were regarded as deluded, malingering, or abusers, and cancer patients through the 1950s were encouraged to wean themselves off opioids until their lives could be measured in weeks. Alongside this opioid evolution, the American Pain Society launched their influential pain as the fifth vital sign campaign in 1995. Concurrently, pharmaceutical companies introduced new formulations, such as extended release oxycodone (OxyContin). From 1997 to 2002, OxyContin prescriptions increased from 670,000 to 6.2 million. However, concerns soon began to surface regarding overzealous opioid treatment. It must be noted that pharmaceutical companies contributed significantly to the rise of the opioid epidemic, receiving considerable reprimands as a consequence. In 2007, as the opioid epidemic began to inflict profound damage, [employer] pleaded guilty to federal charges related to the misbranding of OxyContin. [employer] agreed to pay a total of $634.5 million to resolve Justice Department investigations, as well as a $19.5 million settlement to [address] and the [address] of [place].  In response to the current epidemic, changes in focus to the development of new abuse deterrent opioid formulations at the [place]  Food and Drug Administration (FDA) as well as drafting of new public standards for pain treatment were created at [facility] in 2017. In response to the opioid epidemic, FDA public policy changes were announced in February 2016. Among these new positions were a re-examination of the risk-benefit paradigm for opioids with strict emphasis on the large public health ramifications. The various modified opioids released over the past 20 years, such as tamper-resistant preparation, have had differing levels of success,  and are collectively referred to as Risk Evaluation and Mitigation Strategies (REMS). There is also a growing focus on preventing opioid use disorder (OUD) and on offering affected individuals accessible and effective treatment. [place]  government policy reflects these changes and both the Affordable Care Act and the Mental Health Parity and Addiction Equity Act were major steps forward in treating opioid addiction. The Affordable Care Act, which was signed into law in 2010, with major provisions coming into effect by 2014.  In the 1990s, the intensified marketing of newly reformulated prescription opioid medications (e.g., OxyContin) and an influential pain advocacy campaign that encouraged greater pain management led to a precipitous rise in opioid use in the [place] . Research from the Centers for Disease Control and Prevention (CDC) shows that prescription opioid sales in the [place]  quadrupled from 1999 to 2010. At the same time, opioid misuse and opioid-involved overdose deaths increased (Figure 1). Between 1999 and 2010, the rate of opioidinvolved overdose deaths in the [place]  doubled from 2.9 to 6.8 deaths per 100,000 people. This initial rise in opioid-related deaths is often referred to as the first wave of the recent opioid crisis.  Between 1999 and 2020,  565,000 Americans died of opioid-involved overdoses. In turn, federal, state, and local governments responded with various legal and policy efforts to curb opioid misuse and drug-related overdose Deaths.  Recent Congresses have enacted several laws addressing the opioid crisis, such as the Comprehensive Addiction and Recovery Act of 2016 (CARA, P.L. 114-198); the 21st Century Cures Act (P.L. 114-255); the Substance UseDisorder Prevention that Promotes Opioid Recovery and Treatment for Patients and Communities Act (SUPPORT Act, P.L. [phone]); the Fentanyl  Sanctions Act (Title LXXII of P.L. [id number]); and the Blocking  Deadly Fentanyl  Imports Act (P.L. 117-81, 6610). These laws addressed overprescribing and misuse of opioids, expanded substance use disorder prevention and treatment capacities, bolstered drug diversion capabilities, and enhanced international drug interdiction, counternarcotics cooperation, and sanctions efforts. Congress also directed additional funds to many of these initiatives through appropriations.  Congress provided funding in the [employer] Act of 2021 [phone]; P.L. 117-2) for syringe services programs (often known as needle exchange programs) and other harm reduction initiatives. Federal and state harm reduction strategies have frequently involved the distribution of naloxone (e.g., Narcan)--a medication used to reverse an opioid overdose--and test strips used to detect fentanyl  in drug samples.  The Department of Justice (DOJ) and Department of Homeland Security [facility]) aim to reduce the diversion of prescription opioids and the use, manufacturing, and trafficking of illicit opioids. DOJ--via the Drug Enforcement Administration (DEA)--regulates opioid manufacturers, distributors, and dispensers; it also controls the opioid supply through enforcement of regulatory requirements.  A History of Opiate Laws in the [place]   Prior to 1890, laws concerning opiates were strictly imposed on a local city or state-by-state basis. One of the first was in [place] in 1875 where it became illegal to smoke opium only in opium dens. It did not ban the sale, import or use otherwise. In the next 25 years different states enacted opium laws ranging from outlawing opium dens altogether to making possession of opium, morphine and heroin without a physicians prescription illegal.  The first Congressional Act took place in 1890 that levied taxes on morphine and opium. From that time on the [employer] has had a series of laws and acts directly aimed at opiate use, abuse  and control. These are outlined below:  1906 - Pure Food and Drug Act Preventing the manufacture, sale, or transportation of adulterated or misbranded or poisonous or deleterious foods, drugs, medicines, and liquors, and for regulating traffic therein, and for other purposes. Punishment included fines and prison time.  1909   - Smoking Opium Exclusion Act Banned the importation, possession and use of smoking opium. Did not regulate opium-based medications. First Freight forwarder banning the non-medical use of a substance.  1914  - The Margrette Act In summary, The Margrette Act of 1914 was written more to have all parties involved in importing, exporting, set designer and distributing opium or cocaine to register with the [employer] and have taxes levied upon them. Exempt from the law were physicians operating in the course of his professional practice  1919 - Supreme Court ratified the Bj's in [place] et al., v. [place]  and [place]  v. [place], then again in [facility] v. [place] , in 1920, holding that doctors may not prescribe maintenance supplies of narcotics to people addicted to narcotics. However, it does not prohibit doctors from prescribing narcotics to wean a patient off of the drug. It was also the opinion of the court that prescribing narcotics to habitual users was not considered professional practice hence  it then was considered illegal for doctors to prescribe opioids for the purposes of maintaining an addiction. It can be argued that todays addiction medications are not intended to maintain an addiction but to facilitate addiction remission. In which case, this opinion of the court should not preclude practitioners from prescribing buprenorphine or methadone to patients suffering from an addictive disorder.  1924  - Heroin Act Architectural technologist, importation and possession of heroin illegal - even for medicinal use.  1922 -- Narcotic  Drug Import and Export Act Enacted to assure proper control of importation, sale, possession, production and consumption of narcotics.  1927  -- Special Educational Needs Teacher of [employer] of Prohibition was responsible for tracking bootleggers and organized conservation officer, historic buildings. They focused primarily on interstate and international cases and those cases where local law enforcement official would not or could not act.  1932 -- Uniform State Narcotic Act Encouraged states to pass uniform state laws matching the federal Narcotic Drug Import and Export Act. Suggested prohibiting cannabis use at the state level.  73 -- Food, Drug, and Cosmetic Act The new law brought cosmetics and medical devices under control, and it required that drugs be labeled with adequate directions for safe use. Moreover, it mandated pre-market approval of all new drugs, such that a manufacturer would have to prove to FDA that a drug were safe before it could be sold  1951 -- Boggs Act Imposed maximum criminal penalties for violations of the import/export and internal revenue laws related to drugs and also established mandatory minimum prison sentences.  1956 -- Narcotics Control Act Increased Boggs Act penalties and mandatory prison sentence minimums for violations of existing drug laws.  1965 -- Drug Abuse Control Amendment Enacted to deal with problems caused by abuse of depressants, stimulants and hallucinogens. Restricted research into psychoactive drugs such as LSD by requiring FDA approval.  1970 -- Controlled Substance Act  Controlled Substances Import and Export Act These laws are a consolidation of numerous laws regulating the manufacture and distribution of narcotics, stimulants, depressants, hallucinogens, anabolic steroids, and chemicals used in the illicit production of controlled substances. The CSA places all substances that are regulated under existing federal law into one of five schedules. This placement is based upon  the substance's medicinal value, harmfulness, and potential for abuse or addiction. Schedule I is reserved for the most dangerous drugs that have no recognized medical use, while Schedule V is the classification used for the least dangerous drugs. The act also provides a mechanism for substances to be controlled, added to a schedule, decontrolled, removed from control, rescheduled, or transferred from one schedule to another.  85 - Drug Enforcement Agency By Executive Order, the DEA was formed to take place of the [employer] of Narcotics and Dangerous Drugs.  44 -Narcotic Addict Treatment Act of  1974  - Public Law [phone] Amends the Controlled Substance Act of 1970 to provide for the registration of practitioners conducting narcotic treatment programs. [methadone clinics] It also provides legal definitions for the phrases maintenance treatment and detoxification treatment.  1986 -- Anti-Drug Abuse Act of 1986 Strengthened Federal efforts to encourage foreign cooperation in eradicating illicit drug crops and in halting international drug traffic, to improve enforcement of Federal drug laws and enhance interdiction of illicit drug shipments, to provide strong Market researcher in establishing effective drug abuse prevention and education programs, to [employer] support for drug abuse treatment and rehabilitation efforts, and for other purposes. It also re-imposed mandatory sentencing minimums depending on which drug and  how much was involved.  1988 -- Anti-Drug Abuse Act of 1988 Established the Office of Materials Engineer (ONDCP) in the [employer] of the Economist; authorized funds for [employer], state and local drug enforcement activities, school-based drug prevention efforts, and drug abuse treatment with special emphasis on injecting drug abusers at high risk for AIDS.  2000 -- Federal - The Drug Addiction Treatment Act of 2000 (DATA 2000) It enables qualified physicians to  prescribe and/or dispense narcotics for the purpose of treating opioid dependency. For the first time, physicians are able to treat this disease from their private offices or other clinical settings. This presents a very desirable treatment option for those who are unwilling or unable to seek help in drug treatment clinics. Patients can now be treated in the privacy of their doctors office, as are other people being treated for any other type of medical condition. One medicine doctors may now prescribe is Buprenorphine. The major downfall of this Act is the limitation of 30 patients per practice - which means that large facilities, no matter how many physicians are there, can only treat 30 patients at a time.  2002-- DEA reschedules buprenorphine from a schedule V drug to a schedule III drug, on September 22, 2001 - the day before the FDA approval of Suboxone and Subutex despite overwhelming objection by the medical community.  2004: June 2004 THE CONFIDENTIALITY OF ALCOHOL AND DRUG ABUSE PATIENT RECORDS REGULATION AND THE HIPAA PRIVACY RULE:  Confidentiality of Alcohol and Drug Dependence Patient Records (summary) Code of Federal Regulations Title 42 Part 2 (42 CFR Part 2)  The confidentiality of alcohol and drug dependence patient records maintained by this practice/program is protected by federal law and regulations. Generally, the practice/program may not say to a person outside the practice/program that a patient attends the practice/program, or disclose any information identifying a patient as being alcohol or drug dependent unless:  The patient consents in writing; The disclosure is allowed by a court order, or The disclosure is made to medical personnel in a medical emergency or to qualified personnel for research,  audit, or practice/program evaluation. Violation of the federal law and regulations by a practice/program is a crime. Suspected violations may be reported to appropriate authorities  in accordance with federal regulations. Freight forwarder and regulations do not protect any information about a crime committed by a patient either at the practice/program or against any person who works for the practice/program or about any threat to commit such a crime. Federal laws and regulations do not protect any information about suspected child abuse or neglect from being reported under state law to appropriate state or local authorities.  sample consent form (MS-WORD)  2005: 07-18-2004 Public law [phone], Amends the Controlled Substances Act to eliminate the 30-patient limit for medical group practices allowed to dispense narcotic drugs in schedules III, IV, or V for maintenance or detoxification treatment (retains the 30-patient limit for an individual physician). This amendment removes the 30-patient limit on group medical practices that treat opioid dependence with buprenorphine. The restriction was part of the original Drug Addiction Treatment Act of 2000 (DATA) that allowed treatment of opioid dependence in a doctor's office. With this change, every certified doctor may now prescribe buprenorphine up to his or her individual physician limit of 30 patients.  2006: On 12/14/2005 President Levy signed Bill H.R.6344 into law. This allows physicians who have been certified to prescribe certain drugs for the treatment of opioid dependence under DATA2000 to treat up  to 100 patients (up from 30) by submitting an intent notification to the Dept of Health and [employer]. This is a major step forward in both fighting the stigma and allowing access to treatment previously not available to some. For more details see 30/100-PATIENT LIMIT  2016: HHS augments regulations concerning the 30/100 patient limit by raising the limit to 275 for qualifying physicians. Link to summary of regulation  2016: Comprehensive Addiction and Recovery Act of 2016 (sec.303) amends the Controlled Substance Act - to allow Nurse  Practitioners and Physician Assistants to become eligible to prescribe buprenorphine for the treatment of opioid use disorder. See the entire law for more details.  The roots of the concurrent regulation of certain drugs under two statutory schemes go back to the beginning of this century. In 1906, Congress enacted the Pure Food and Drug Act, establishing one regime of regulation to assure (among other things) that drugs were not adulterated or misbranded. These regulations were amended several times, recodified in 1938, and expanded on again from the 1940s through the 1990s. Their implementation and enforcement is today assigned to the Food and Drug Administration (FDA) in the Department of Health and Human Services [facility]).  In 1914, Congress adopted the [place] Narcotic Act to stop abuse of addictive drugs. The [place] Narcotic Act was amended in 1937 to include marijuana. In 1965, amphetamines, barbiturates, and hallucinogens came under regulation, but under the [employer], Drug, and Cosmetic Act. In 1970, these various statutes were consolidated and recodified as the Controlled Substances Act (CSA), which has been amended several times since then. Its implementation and enforcement is today assigned to the Drug Enforcement Administration (DEA) in the Department of Justice.  The first clash occurred after World War I, when so-called morphine clinics existed and physicians prescribed or dispensed morphine to addicts. Some addicts were veterans of the American Civil War, the Spanish-American War, and WWI, who had become addicted during treatment for war wounds, but most of them came from the growing population of nonmedical addicts ([place], 8017). The Narcotics Division of the Prohibition Unit of the Department of the Treasury, which was then responsible for enforcing the [facility] Narcotic Act, concluded that this activity was not the legitimate practice of medicine but simple drug trafficking. The  Treasury Department swiftly closed the clinics and made it personally and professionally risky for physicians to maintain a narcotic addict for any reason. In did so, however, only after the American Medical Association had adopted a resolution, in 1920, opposing ambulatory clinics''.  In 1972, the public health establishment, including the Secretary of Health, Education, and Welfare, the Education Officer, Environmental, the [employer] of [employer], and the Chemical Engineer for Drug Abuse Prevention, was unprepared to allow [employer] of Narcotics and Dangerous Drugs, DEA's predecessor agency, to unilaterally define the parameters of medical practice for the use of methadone in the treatment of heroin addiction. As a consequence, a new set of rules--the third, on top of the FDA and DEA schemes--was added, one that inserted FDA deeply into the practice of medicine, notwithstanding its protestations to the contrary. Congress ratified this joint responsibility of law enforcement and public health officials for methadone through this third set of rules in 1974 with the passage of the Narcotic Addict Treatment Act (NATA). To examine in detail the evolution of this third set of rules--commonly referred to as the FDA or DHHS methadone regulations--we turn, first, to the period of the mid-1960s.  Increased use of heroin in  the post World War II period first became apparent in the early to mid 1950s. During the [employer], a minimum mandatory narcotics law was enacted in 1956, effective July 1957. 1962 [facility] conference on drug abuse, the [employer] on Narcotic and Drug Abuse (the [employer]) of 1963, the Drug Abuse Control Amendments of 1965, the President's Commission on [employer] and Administration of Justice (the [employer]) of [phone], and the Narcotic Addiction Rehabilitation Act of 1966.  The 1965 Drug Abuse Control Amendments  brought under strict federal control all nonnarcotic drugs capable of producing serious psychotoxic effects when abused. This act also created the [employer] of Drug Abuse Control within the Department of Health, Education, and Welfare (DHEW) and shifted the basis for aon corporation of illegal drugs from tax principles (administered by the Department of Treasury) to the regulation of commerce (administered by the [employer]).  The 1966 Narcotic Addiction Rehabilitation Act TOUR MANAGER) authorized the civil commitment of narcotic addicts, and federal assistance to state and local governments to develop a local system of drug treatment programs. With respect to the latter, the [employer] of Mental Health [facility]) initially proposed the gradual implementation of the state assistance effort, mainly through a common mental health mechanism--inpatient treatment programs. However, because of a perceived pressing need, the courts began to commit addicts to these programs even before they were officially opened or staffed. The NARA legislation imposed the following contract requirements on treatment centers: (1) thrice-a-week counseling sessions; (2) weekly urine tests; (3) restorative dental services; (4) psychological consultations and vocational training; and (5) the treatment modalities of drug-free outpatient, therapeutic community, and methadone maintenance. Reorganization Plan No. 1 of 1968 transferred the primary functions of the [employer] of Narcotics (FBN) from the [employer] to the Department of Justice; it also transferred the [employer] of Drug Abuse Control functions to the Department of Justice. Within the [employer], the [employer] of Narcotics and Dangerous Drugs (BNDD) was created, which became the Drug Enforcement Administration in 1973.   Under the first Eagle administration [phone]), federal drug abuse policy developed in a significant way. These developments included a 1969 war  on drugs presidential message, resulting legislation in 1970, and a Special Action Office created by executive order in 1971 and authorized in statute in 1972. Brynn, in 1969, to send a message to Congress on drug abuse. Although this was the first time that a U.S. president invoked the war on drugs image, it was in retrospect the most balanced approach to the problem of drug abuse that had been advanced. The 1969 message resulted in the submission of legislation to the Congress and the passage, the following year, of the Comprehensive Drug Abuse Prevention and Control Act of 1970 [employer] [phone], October 12, 1969). The act dealt with research, treatment, and prevention of drug abuse and drug dependence, and with drug abuse charity fundraiser. One major purpose of the 1970 legislation was to reverse some of the strictures of the [employer] of 1914. The 1970 act sought to clarify for the medical profession . . . the extent to which they may safely go in treating narcotic addicts as patients. Title I, in Section IV, charged the Surveyor, Minerals, Education, and Welfare, to determine the appropriate methods of professional practice in the medical treatment of the narcotic addiction of various classes of narcotic addicts. This provision constitutes the initial statutory basis for treatment standards. The law enforcement sections consolidated all prior federal statutes into  the Controlled Substances Act and the Controlled Substances Export and Import Act (Titles II and III, respectively, of the Comprehensive Drug Abuse Prevention and Control Act of 1970). Under this legislation, substances were classified under five schedules according to their abuse potential, and psychological and physical effects. Methadone was placed in Schedule II, along with such opiate drugs as morphine, codeine, and hydrocodone .  One of the most important steps taken by President Brynn was to establish in June 1971 the  Special Action Office for Drug Abuse Prevention (SAODAP) in the [employer] of the President ([place] [phone], June 02, 1970). In mid-1971, [facility] appointed Dr. Maple Dunnings as SAODAP director. Within a year, the Drug Abuse Prevention Office and Treatment Act of 1972 [employer] [phone], March 07, 1971) gave statutory authority to [facility], but limiting setting, on June 15, 1974, as the limit on its existence.  The purpose of the 1972 act was to bring the resources of the federal government to bear on drug abuse with the immediate objective of significantly reducing its incidence and developing a comprehensive, coordinated long-term federal strategy to combat drug abuse.  Narcotic Addict Treatment Act [employer]) of 1974 [employer] [phone]), which amended the Controlled Substances Act. This legislation was driven by concern for the diversion of methadone to illicit channels that was occurring in 1972 and 1973, as reflected in the title of the Senate bill adopted on May 24, 1972, the Methadone Diversion Control Act of 1973. (U.S. Senate, 1970a, 8029a).  The 1980 final rule (45 FR 37305, September 05, 1979) reduced the minimum standard for admission from two years of addiction to one year coupled with a clinical determination that the individual was currently physiologically.  The regulations were next revised in 1989, following two proposals to modify them, one in 1983 and one in 1987.  Under President Tanda Corrente, a government-wide effort was made to review all federal government regulations and to eliminate or reduce the burden of these regulations on the private sector, state and nash-finch company, and wps resources.   The 1983 recommendations, though not adopted, did initiate another revision of the methadone regulations, which first found expression in a 1987 proposed rule (52 FR 37047, September 17, 1986) and culminated in a final rule (54 FR 8954, February 16, 1988) at the end of the  decade. In the 1987 proposed rule, the FDA and NIDA, in an effort to put the best face on the unenthusiastic 1983 response by the provider community to converting the regulations to guidelines, indicated that they had retained the current requirements necessary to achieve the goals of the 1974 NATA, but were proposing to streamline the regulation and to promote more efficient operation of methadone programs. The 1987 proposed rule, issued by the FDA and NIDA, advanced the following changes in the methadone regulation: that detoxification treatment be divided into short-term (<21 days) and long-term (>21 and <180 days) treatment; that the minimum staffing ratio of one counselor to 50 patients be eliminated; that blood tests be allowed as ways to conduct initial drug screening or to meet the monthly testing requirements for six-day take-home patients; that the 72-hour notification of FDA and the pertinent state authority for methadone doses greater than 100 mg be eliminated; that special adverse reaction reporting requirements for methadone be eliminated and reliance placed upon general FDA reporting requirements; that a supervising counselor be allowed to conduct the annual review of the patient's treatment plan for certain qualified patients who had been in treatment for 3  years or longer; and that the requirement of an annual report of methadone treatment programs to the FDA be dropped. The FDA and NIDA issued a final rule on February 16, 1988, based on comments on the 1987 proposed (54 FR 8954). Concurrently, FDA and NIDA issued a six-page guidance document, which noted that the regulations, over time, had recommended certain practices that were not actually required. Public Health Service, in Congress, and elsewhere, to reorganize the Alcohol, Drug Abuse, and Mental Health Administration (ADAMHA). These efforts culminated in the [employer] of 1992 [employer] [phone], June 26, 1991), the main  purpose of which was to transfer the research portions of the three ADAMHA institutes--NIDA, the [employer] Mills of Alcoholism and Alcohol Abuse, and the [employer] of Mental Health--to the [employer] and to create the Substance Abuse and Museum/gallery Exhibitions Officer [facility]) as the home for the service functions of these entitles.  Guidelines for Opioid Treatment The Federal Guidelines for Opioid Treatment Programs - 2015 serve as a guide to accrediting organizations for developing accreditation standards. The guidelines also provide OTPs with information on how programs can achieve and maintain compliance with federal regulations. The 2015 guidelines are an update to the 2007 Guidelines for the Accreditation of Opioid Treatment Programs (PDF  547 KB). The new document reflects the obligation of OTPs to deliver care consistent with the patient-centered, integrated, and recovery-oriented standards of substance use treatment.  DPT oversees the certification of OTPs and provides guidance to nonprofit organizations and state governmental entities that want to become a SAMHSA-approved accrediting body. Learn more about the accreditation and certification of OTPs and [facility] oversight of OTP accreditation bodies.  Model Guidelines for [employer] With input from [facility], the Federation of [employer] in 2013 adopted a revised version of the federations office-based opioid treatment policies. The Model Policy on DATA 2000 and Treatment of Opioid Addiction in the Medical Office - 2013 (PDF  279 KB) provides model guidelines for use by state medical boards in regulating office-based opioid treatment.  Holiday Guidance for Opioid Treatment Programs (PDF  203 KB) In response to requests for the upcoming federal holidays and ensuing weekends (December 24th, 25th, and 26th and December 31st, Jan 1st, and Jan 2nd), this letter is to provide guidance  regarding requests for unsupervised doses of medication for patients for these dates. View a sample [id number] (PDF  194 KB).  Federal regulation of drugs emerged as early as 37, under a law that addressed only imported drugs. In 1905 the [employer] launched a private, voluntary means of controlling a substantial part of the drug marketplace, a system that remained in place for over a half-century. Drug regulation in FDA has evolved considerably since President Ricardo Para signed the 1906 Pure Food and Drugs Act.  1820 Eleven doctors set up the U.S. Pharmacopeia and record the first list of standard drugs. 1848 Drug Importation Act passed by Congress requires U.S. Customs Service inspection to stop entry of tainted, low quality drugs from overseas. 8116 Dr. Mitchell MICAEL Burrs becomes the chief chemist at the [facility] of [employer] adulteration studies.  1905 The American Medical Association [facility]) begins a voluntary program of drug approval that would last until 1955. In order to advertise in the [facility] and related journals, drug companies must show proof that the drug will treat what they claim. 1906 The original Food and Drug Act is passed by Congress on June 30 and signed by Angus Ricardo  Roosevelt. The Act outlaws states from buying and selling food, drinks, and drugs that have been mislabeled and tainted. 1911 In U.S. v. Vicci, the [employer] that the [employer] and Drugs Act does not outlaw false medical claims but only false and misleading statements about the ingredients or identity of a drug. 1912 Congress passes the [place] Amendment to overcome the ruling in U.S. v. Vicci. The Act outlaws labeling medicines with fake medical claims that is meant to trick the buyer. 1930 The name of the Food, Drug, and Insecticide Administration is shortened to Food and Drug Administration (FDA) under an therapist, music. 1933 FDA  recommends a total rewrite of the out-of-date 1906 Food and Drugs Act.   1937 Elixir Sulfanilamide, contain the poisonous liquid, diethylene glycol, kills 107 persons, many of whom are children, dramatizing the need to establish drug safety before marketing and to pass the pending food and drug law. 1938 Congress passes [employer], Drug, and Cosmetic (FDC) Act of 1938, which requires that new drugs show safety before selling. This starts a new system of drug regulation. The Act also requires that safe limits be set for unavoidable poisonous matter and allows for factory inspections. The Directv is given power to oversee advertising for all FDAregulated products except prescription drugs. FDA states that sulfanilamide and other dangerous drugs must be given under the direction of a medical expert. This begins the requirement for prescription only (nonnarcotic) drugs (see 1951 Russell-Humphrey amendment). 1941 Nearly 300 deaths and injuries result from the use of sulfathiazole tablets, an antibiotic, tainted with the sedative, phenobarbital. In response, FDA drastically changes manufacturing and quality controls. These changes lead to the development of good manufacturing practices (GMPs). 1948 The [employer] in U.S. v. Floretta that FDA jurisdiction extends to retail stores, thereby allowing FDA to stop illegal sales of drugs by pharmacies including barbiturates and amphetamines. 1950 In Walgreen. v. U.S., a U.S. Court of Appeals rules that the directions for use on a drug label must include the drugs purpose. 1951 Congress passes the [place]-Humphrey Amendment, which defines the kinds of drugs that cannot be used safely without medical supervision. The amendment limits sale of these drugs to prescription only by a medical professional. All other drugs are to be available without a prescription. 1952 A nationwide investigation by FDA  reveals that chloramphenicol, an antibiotic, caused nearly 180 cases of often deadly blood diseases. Two years later FDA engages the [employer] of Hospital Pharmacists, the American Association of Medical Record Librarians, and later the American Medical Association in a voluntary program of drug reaction reporting. 1953 The [employer] Amendment clarifies previous law and requires FDA to give manufacturers written reports of conditions seen during inspections and results of factory samples. 1962 Thalidomide, a new sleeping pill, causes severe birth defects of the arms and legs in thousands of babies born in [place]. The U.S. media reports on how Dr. Cathlean Mort, a FDA medical officer, helped prevent approval and marketing of Thalidomide in the [place] . These reports stirred up public support for stronger drug laws. 3 Congress passes the [employer]. For the first time, these laws require drug makers to prove their drug works before FDA can approve them for sale. The Advisory Committee on Investigational Drugs meets for the first time. This was the first meeting of a committee to advise FDA on product approval and policy on an ongoing basis. 1966 FDA contracts with the [employer]  Academy of Sciences/National Research Council to measure the effectiveness of 4,000 marketed drugs approved on the basis of safety alone between [phone] and 12-17-61. The Fair Packaging and Labeling Act requires all consumer products, in interstate commerce, to be honestly and informatively labeled. 12-18-1967 FDA forms the Drug Efficacy Study Implementation (DESI) to carry out recommendations of the [employer] of the effectiveness of drugs first sold between [place] and January 01, 19631971/01/01 FDA requires the first patient package insert, medicines must come with information for the patient about risks and benefits. 1972 Over-the-Counter Drug Review begins  to enhance the safety, effectiveness and appropriate labeling of drugs sold without prescription. 1973 The U.S. Supreme Court upholds the [place] drug effectiveness law and approves FDAs action to control entire classes of products. 1982 FDA issues Tamper-resistant Packaging Regulations to prevent poisonings such as deaths from cyanide placed in Tylenol  capsules. Congress passes the [employer] in 1982/12/17, making it a crime to tamper with packaged consumer products. 12-18-1983 Drug Price Advertising Account Planner Act (Hatch-Waxman Act) increases the availability of less costly generic drugs by allowing FDA to approve applications for generic versions of brand-name drugs without repeating the research that proved the safety and effectiveness of the brand-name drugs. The Act also allowed brand-name companies to apply for up to five years additional patent protection for the new medicines they developed to make up for time lost while their products were going through FDA's approval process. 1989 The FDA issued guidelines asking drug makers to decide if a drug is likely to have usefulness in elderly people and to include elderly people in studies when applicable. 1991 In 12/17/80, the FDA and the Department of Health and Human Services published a policy on protecting people in research. In 12-17-90, this policy is adopted by more than a dozen federal agencies involved in human subject research and becomes known as the Common Rule. 4 1993 FDA launches MedWatch, a system designed to collect reports from health professionals on problems with drugs and other medical products. FDA issues guidelines for measuring gender differences in responses to medication. Drug companies are encouraged to include patients of both sexes in their research of drugs and to study any gender-specific effects. 1995 FDA declares cigarettes to be drug delivery devices. Limits are issued on marketing and  sales to reduce smoking by young people. 1998 FDA introduces the Adverse Event Reporting System (AERS), a computerized database designed to store and study safety reports on already marketed drugs.  The Demographic Rule requires that a marketing application review data on safety and effectiveness by age, gender, and race. The Pediatric Rule requires drug makers of selected new and existing drugs to conduct studies on drug safety and effectiveness in children. 1999 Creation of the Drug Facts Label for OTC drug products. The law requires all overthe-counter drug labels to have information in a standard format. These drug facts labels are designed to give the user easy-to-find information. 2000 The U. S. [employer], upholds an earlier decision from The [employer] and Drug Administration v. [employer]. et al. and rules 5-4 that FDA does not have authority to regulate tobacco as a drug. Dec 17, 2001 The [employer] Pharmaceuticals for Children Act, in exchange for studying the drug in children, the drug maker gets six months of selling their product without competition. 2002-12-17 The Pediatric Research Equity Act gives FDA the right to ask drug companies to study the effectiveness of new drugs in children. December 18, 2003 FDA advises medical professionals  to limit the use of a pain reliever called Cox-2, a nonsteroidal anti-inflammatory drug (NSAIDs). Studies had shown that long-term use raised chances of heart attacks and strokes. The warning is also added to the over-thecounter NSAIDs Drug Facts label. Medicines used in hospitals must have a bar code to prevent patients from receiving the wrong medicine. 5 2005 The Drug Safety Board is formed, consisting of FDA staff and representatives from the [employer] of [address] and the [employer]. The Board advises the Director, Center for Drug Evaluation and Research, FDA, on drug safety issues and works with the agency in sharing safety  information to health professionals and patients.  The [place]  Food and Drug Administration (FDA) was first created to enforce the Pure Food and Drug Act of 1906. In this capacity, the FDA is charged with protecting the health of the [place]  public, to ensure the quality of its food, medicine, and cosmetics. Before this time, the [place]  government had no formal oversight of these products and left issues of quality and purity to the individual manufactures, or at times, individual states.    Review: [place] Stop ACT. (The Strengthen Opioid Misuse Prevention (STOP) Act of 2017). GENERAL ASSEMBLY OF [place]  SESSION 2017 SESSION LAW 2017-74 HOUSE BILL 243  PMP mandatory The dispenser shall report: (1) The dispenser's DEA number. (2) The name of the patient for whom the controlled substance is being dispensed, and the patient's: a. Full address, including city, state, and zip code, b. Telephone number, and c. Date of birth. (3) The date the prescription was written. (4) The date the prescription was filled. (5) The prescription number. (6) Whether the prescription is new or a refill. (7) Metric quantity of the dispensed drug. (8) Estimated days of supply of dispensed drug, if provided to the dispenser. (9) National Drug Code of dispensed drug. (10) Prescriber's DEA number. (11) Method of payment for the prescription.  No paper prescriptions  Duration of scripts Acute vs Chronic prescribing  2016 CDC Guidelines for prescribing Opioids for Chronic Pain. (Updated in 2022.) Medical Board  Laws:  Prescription Laws Drug laws, rules, and regulations are constantly changing. Any attempt to summarize them would quickly become outdated. Because of that, the Board encourages practitioners who seek guidance on prescribing procedures to refer to the sources listed below in addition to the Boards position statements, rules and Medical Practice Act.  [place]  Board of Pharmacy  (NCBOP) (which offers the states pharmacy laws and rules, and links to the Code of Federal Regulations) [employer] Site: www.ncbop.org  [place]  General Statutes General Web Site: politicalpool.cz See: [place]  Food, Drug, and Cosmetic Act: S293155 & 106-134 See: [place]  Pharmacy Practice Act, Article 4A: [phone] See:   Controlled Substances Act, Article 5: 90-86 & 90-113.8 See: Use of controlled substances to render one mentally incapacitated or physically helpless: [employer]. Code, Title 21, Food & Drugs www.deadiversion.usdoj.gov Controlled Substances Schedules www.deadiversion.usdoj.gov Drug Warehouse Manager - www.deadiversion.usdoj.gov 42 CFR  8.12 - Federal opioid treatment standards.   Effective August 12, 2016, prior approval will be required for opioid analgesic doses for [facility]. Medicaid and [place] [facility]) beneficiaries which:  Exceed 120 mg of morphine equivalents (MME) per day  Are greater than a 14-day supply of any opioid, or,  Are non-preferred opioid products on the [place] Medicaid Preferred Drug List (PDL)  FEDERAL 42 CFR  8.12 - Federal opioid treatment standards. Title II of the Comprehensive Drug  Abuse Prevention and Control Act of 1970, commonly known as the Controlled Substance Act (CSA) Title 21 [place]  Code ([place]) Controlled Substances Act.   Reference:   ______________________________________________________________________       ______________________________________________________________________    Medication Rules  Purpose: To inform patients, and their family members, of our medication rules and regulations.  Applies to: All patients receiving prescriptions from our practice (written or electronic).  Pharmacy of record: This is the pharmacy where your electronic prescriptions will be sent. Make sure we have the correct one.  Electronic  prescriptions: In compliance with the [place]  Strengthen Opioid Misuse Prevention (STOP) Act of 2017 (Session Law 2017-74/H243), effective December 17, 2018, all controlled substances must be electronically prescribed. Written prescriptions, faxing, or calling prescriptions to a pharmacy will no longer be done.  Prescription refills: These will be provided only during in-person appointments. No medications will be renewed without a face-to-face evaluation with your provider. Applies to all prescriptions.  NOTE: The following applies primarily to controlled substances (Opioid* Pain Medications).   Type of encounter (visit): For patients receiving controlled substances, face-to-face visits are required. (Not an option and not up to the patient.)  Patient's Responsibilities: Pain Pills: Bring all pain pills to every appointment (except for procedure appointments). Pill counts are required.  Pill Bottles: Bring pills in original pharmacy bottle. Bring bottle, even if empty. Always bring the bottle of the most recent fill.  Medication refills: You are responsible for knowing and keeping track of what medications you are taking and when is it that you will need a refill. The day before your appointment: write a list of all prescriptions that need to be refilled. The day of the appointment: give the list to the admitting nurse. Prescriptions will be written only during appointments. No prescriptions will be written on procedure days. If you forget a medication: it will not be Called in, Faxed, or electronically sent. You will need to get another appointment to get these prescribed. No early refills. Do not call asking to have your prescription filled early. Partial  or short prescriptions: Occasionally your pharmacy may not have enough pills to fill your prescription.  NEVER ACCEPT a partial fill or a prescription that is short of the total amount of pills that you were prescribed.  With  controlled substances the law allows 72 hours for the pharmacy to complete the prescription.  If the prescription is not completed within 72 hours, the pharmacist will require a new prescription to be written. This means that you will be short on your medicine and we WILL NOT send another prescription to complete your original prescription.  Instead, request the pharmacy to send a carrier to a nearby branch to get enough medication to provide you with your full prescription. Prescription Accuracy: You are responsible for carefully inspecting your prescriptions before leaving our office. Have the discharge nurse carefully go over each prescription with you, before taking them home. Make sure that your name is accurately spelled, that your address is correct. Check the name and dose of your medication to make sure it is accurate. Check the number of pills, and the written instructions to make sure they are clear and accurate. Make sure that you are given enough medication to last until your next medication refill appointment. Taking Medication: Take medication as prescribed. When it comes to controlled substances, taking less pills or less frequently than prescribed is permitted and encouraged. Never take more pills than instructed. Never take the medication more frequently  than prescribed.  Inform other Doctors: Always inform, all of your healthcare providers, of all the medications you take. Pain Medication from other Providers: You are not allowed to accept any additional pain medication from any other Doctor or Healthcare provider. There are two exceptions to this rule. (see below) In the event that you require additional pain medication, you are responsible for notifying us , as stated below. Cough Medicine: Often these contain an opioid, such as codeine or hydrocodone . Never accept or take cough medicine containing these opioids if you are already taking an opioid* medication. The combination may cause  respiratory failure and death. Medication Agreement: You are responsible for carefully reading and following our Medication Agreement. This must be signed before receiving any prescriptions from our practice. Safely store a copy of your signed Agreement. Violations to the Agreement will result in no further prescriptions. (Additional copies of our Medication Agreement are available upon request.) Laws, Rules, & Regulations: All patients are expected to follow all [address] and [employer], Rules, [place] [employer]. Ignorance of the Laws does not constitute a valid excuse.  Illegal drugs and Controlled Substances: The use of illegal substances (including, but not limited to marijuana and its derivatives) and/or the illegal use of any controlled substances is strictly prohibited. Violation of this rule may result in the immediate and permanent discontinuation of any and all prescriptions being written by our practice. The use of any illegal substances is prohibited. Adopted CDC guidelines & recommendations: Target dosing levels will be at or below 60 MME/day. Use of benzodiazepines** is not recommended. Urine Drug testing: Patients taking controlled substances will be required to provide a urine sample upon request. Do not void before coming to your medication management appointments. Hold emptying your bladder until you are admitted. The admitting nurse will inform you if a sample is required. Our practice reserves the right to call you at any time to provide a sample. Once receiving the call, you have 24 hours to comply with request. Not providing a sample upon request may result in termination of medication therapy.  Exceptions: There are only two exceptions to the rule of not receiving pain medications from other Healthcare Providers. Exception #1 (Emergencies): In the event of an emergency (i.e.: accident requiring emergency care), you are allowed to receive additional pain medication. However, you are  responsible for: As soon as you are able, call our office [phone], at any time of the day or night, and leave a message stating your name, the date and nature of the emergency, and the name and dose of the medication prescribed. In the event that your call is answered by a member of our staff, make sure to document and save the date, time, and the name of the person that took your information.  Exception #2 (Planned Surgery): In the event that you are scheduled by another doctor or dentist to have any type of surgery or procedure, you are allowed (for a period no longer than 30 days), to receive additional pain medication, for the acute post-op pain. However, in this case, you are responsible for picking up a copy of our Post-op Pain Management for Surgeons handout, and giving it to your surgeon or dentist. This document is available at our office, and does not require an appointment to obtain it. Simply go to our office during business hours (Monday-Thursday from 8:00 AM to 4:00 PM) (Friday 8:00 AM to 12:00 Noon) or if you have a scheduled appointment with us , prior to your  surgery, and ask for it by name. In addition, you are responsible for: calling our office [phone], at any time of the day or night, and leaving a message stating your name, name of your surgeon, type of surgery, and date of procedure or surgery. Failure to comply with your responsibilities may result in termination of therapy involving the controlled substances.  Consequences:  Non-compliance with the above rules may result in permanent discontinuation of medication prescription therapy. All patients receiving any type of controlled substance is expected to comply with the above patient responsibilities. Not doing so may result in permanent discontinuation of medication prescription therapy. Medication Agreement Violation. Following the above rules, including your responsibilities will help you in avoiding a Medication  Agreement Violation (Breaking your Pain Medication Contract).  *Opioid medications include: morphine, codeine, oxycodone, oxymorphone, hydrocodone , hydromorphone, meperidine, tramadol, tapentadol, buprenorphine, fentanyl , methadone. **Benzodiazepine medications include: diazepam  (Valium ), alprazolam (Xanax), clonazepam (Klonopine), lorazepam (Ativan), clorazepate (Tranxene), chlordiazepoxide (Librium), estazolam (Prosom), oxazepam (Serax), temazepam (Restoril), triazolam (Halcion) (Last updated: 10/09/2023) ______________________________________________________________________     ______________________________________________________________________    Medication Rules  Purpose: To inform patients, and their family members, of our medication rules and regulations.  Applies to: All patients receiving prescriptions from our practice (written or electronic).  Pharmacy of record: This is the pharmacy where your electronic prescriptions will be sent. Make sure we have the correct one.  Electronic prescriptions: In compliance with the [place]  Strengthen Opioid Misuse Prevention (STOP) Act of 2017 (Session Law 2017-74/H243), effective December 17, 2018, all controlled substances must be electronically prescribed. Written prescriptions, faxing, or calling prescriptions to a pharmacy will no longer be done.  Prescription refills: These will be provided only during in-person appointments. No medications will be renewed without a face-to-face evaluation with your provider. Applies to all prescriptions.  NOTE: The following applies primarily to controlled substances (Opioid* Pain Medications).   Type of encounter (visit): For patients receiving controlled substances, face-to-face visits are required. (Not an option and not up to the patient.)  Patient's Responsibilities: Pain Pills: Bring all pain pills to every appointment (except for procedure appointments). Pill counts are required.  Pill  Bottles: Bring pills in original pharmacy bottle. Bring bottle, even if empty. Always bring the bottle of the most recent fill.  Medication refills: You are responsible for knowing and keeping track of what medications you are taking and when is it that you will need a refill. The day before your appointment: write a list of all prescriptions that need to be refilled. The day of the appointment: give the list to the admitting nurse. Prescriptions will be written only during appointments. No prescriptions will be written on procedure days. If you forget a medication: it will not be Called in, Faxed, or electronically sent. You will need to get another appointment to get these prescribed. No early refills. Do not call asking to have your prescription filled early. Partial  or short prescriptions: Occasionally your pharmacy may not have enough pills to fill your prescription.  NEVER ACCEPT a partial fill or a prescription that is short of the total amount of pills that you were prescribed.  With controlled substances the law allows 72 hours for the pharmacy to complete the prescription.  If the prescription is not completed within 72 hours, the pharmacist will require a new prescription to be written. This means that you will be short on your medicine and we WILL NOT send another prescription to complete your original prescription.  Instead, request the pharmacy to  send a carrier to a nearby branch to get enough medication to provide you with your full prescription. Prescription Accuracy: You are responsible for carefully inspecting your prescriptions before leaving our office. Have the discharge nurse carefully go over each prescription with you, before taking them home. Make sure that your name is accurately spelled, that your address is correct. Check the name and dose of your medication to make sure it is accurate. Check the number of pills, and the written instructions to make sure they are clear and  accurate. Make sure that you are given enough medication to last until your next medication refill appointment. Taking Medication: Take medication as prescribed. When it comes to controlled substances, taking less pills or less frequently than prescribed is permitted and encouraged. Never take more pills than instructed. Never take the medication more frequently than prescribed.  Inform other Doctors: Always inform, all of your healthcare providers, of all the medications you take. Pain Medication from other Providers: You are not allowed to accept any additional pain medication from any other Doctor or Healthcare provider. There are two exceptions to this rule. (see below) In the event that you require additional pain medication, you are responsible for notifying us , as stated below. Cough Medicine: Often these contain an opioid, such as codeine or hydrocodone . Never accept or take cough medicine containing these opioids if you are already taking an opioid* medication. The combination may cause respiratory failure and death. Medication Agreement: You are responsible for carefully reading and following our Medication Agreement. This must be signed before receiving any prescriptions from our practice. Safely store a copy of your signed Agreement. Violations to the Agreement will result in no further prescriptions. (Additional copies of our Medication Agreement are available upon request.) Laws, Rules, & Regulations: All patients are expected to follow all [address] and [employer], Rules, [employer]. Ignorance of the Laws does not constitute a valid excuse.  Illegal drugs and Controlled Substances: The use of illegal substances (including, but not limited to marijuana and its derivatives) and/or the illegal use of any controlled substances is strictly prohibited. Violation of this rule may result in the immediate and permanent discontinuation of any and all prescriptions being written by our  practice. The use of any illegal substances is prohibited. Adopted CDC guidelines & recommendations: Target dosing levels will be at or below 60 MME/day. Use of benzodiazepines** is not recommended. Urine Drug testing: Patients taking controlled substances will be required to provide a urine sample upon request. Do not void before coming to your medication management appointments. Hold emptying your bladder until you are admitted. The admitting nurse will inform you if a sample is required. Our practice reserves the right to call you at any time to provide a sample. Once receiving the call, you have 24 hours to comply with request. Not providing a sample upon request may result in termination of medication therapy.  Exceptions: There are only two exceptions to the rule of not receiving pain medications from other Healthcare Providers. Exception #1 (Emergencies): In the event of an emergency (i.e.: accident requiring emergency care), you are allowed to receive additional pain medication. However, you are responsible for: As soon as you are able, call our office [phone], at any time of the day or night, and leave a message stating your name, the date and nature of the emergency, and the name and dose of the medication prescribed. In the event that your call is answered by a member of our  staff, make sure to document and save the date, time, and the name of the person that took your information.  Exception #2 (Planned Surgery): In the event that you are scheduled by another doctor or dentist to have any type of surgery or procedure, you are allowed (for a period no longer than 30 days), to receive additional pain medication, for the acute post-op pain. However, in this case, you are responsible for picking up a copy of our Post-op Pain Management for Surgeons handout, and giving it to your surgeon or dentist. This document is available at our office, and does not require an appointment to obtain it.  Simply go to our office during business hours (Monday-Thursday from 8:00 AM to 4:00 PM) (Friday 8:00 AM to 12:00 Noon) or if you have a scheduled appointment with us , prior to your surgery, and ask for it by name. In addition, you are responsible for: calling our office [phone], at any time of the day or night, and leaving a message stating your name, name of your surgeon, type of surgery, and date of procedure or surgery. Failure to comply with your responsibilities may result in termination of therapy involving the controlled substances.  Consequences:  Non-compliance with the above rules may result in permanent discontinuation of medication prescription therapy. All patients receiving any type of controlled substance is expected to comply with the above patient responsibilities. Not doing so may result in permanent discontinuation of medication prescription therapy. Medication Agreement Violation. Following the above rules, including your responsibilities will help you in avoiding a Medication Agreement Violation (Breaking your Pain Medication Contract).  *Opioid medications include: morphine, codeine, oxycodone, oxymorphone, hydrocodone , hydromorphone, meperidine, tramadol, tapentadol, buprenorphine, fentanyl , methadone. **Benzodiazepine medications include: diazepam  (Valium ), alprazolam (Xanax), clonazepam (Klonopine), lorazepam (Ativan), clorazepate (Tranxene), chlordiazepoxide (Librium), estazolam (Prosom), oxazepam (Serax), temazepam (Restoril), triazolam (Halcion) (Last updated: 10/09/2023) ______________________________________________________________________     ______________________________________________________________________    Medication Recommendations and Reminders  Applies to: All patients receiving prescriptions (written and/or electronic).  Medication Rules & Regulations: You are responsible for reading, knowing, and following our Medication Rules document. These  exist for your safety and that of others. They are not flexible and neither are we. Dismissing or ignoring them is an act of non-compliance that may result in complete and irreversible termination of such medication therapy. For safety reasons, non-compliance will not be tolerated. As with the [place] fundamental legal principle of ignorance of the law is no defense, we will accept no excuses for not having read and knowing the content of documents provided to you by our practice.  Pharmacy of record:  Definition: This is the pharmacy where your electronic prescriptions will be sent.  We do not endorse any particular pharmacy. It is up to you and your insurance to decide what pharmacy to use.  We do not restrict you in your choice of pharmacy. However, once we write for your prescriptions, we will NOT be re-sending more prescriptions to fix restricted supply problems created by your pharmacy, or your insurance.  The pharmacy listed in the electronic medical record should be the one where you want electronic prescriptions to be sent. If you choose to change pharmacy, simply notify our nursing staff. Changes will be made only during your regular appointments and not over the phone.  Recommendations: Keep all of your pain medications in a safe place, under lock and key, even if you live alone. We will NOT replace lost, stolen, or damaged medication. We do not accept Police Reports as  proof of medications having been stolen. After you fill your prescription, take 1 week's worth of pills and put them away in a safe place. You should keep a separate, properly labeled bottle for this purpose. The remainder should be kept in the original bottle. Use this as your primary supply, until it runs out. Once it's gone, then you know that you have 1 week's worth of medicine, and it is time to come in for a prescription refill. If you do this correctly, it is unlikely that you will ever run out of medicine. To make  sure that the above recommendation works, it is very important that you make sure your medication refill appointments are scheduled at least 1 week before you run out of medicine. To do this in an effective manner, make sure that you do not leave the office without scheduling your next medication management appointment. Always ask the nursing staff to show you in your prescription , when your medication will be running out. Then arrange for the receptionist to get you a return appointment, at least 7 days before you run out of medicine. Do not wait until you have 1 or 2 pills left, to come in. This is very poor planning and does not take into consideration that we may need to cancel appointments due to bad weather, sickness, or emergencies affecting our staff. DO NOT ACCEPT A Partial Fill: If for any reason your pharmacy does not have enough pills/tablets to completely fill or refill your prescription, do not allow for a partial fill. The law allows the pharmacy to complete that prescription within 72 hours, without requiring a new prescription. If they do not fill the rest of your prescription within those 72 hours, you will need a separate prescription to fill the remaining amount, which we will NOT provide. If the reason for the partial fill is your insurance, you will need to talk to the pharmacist about payment alternatives for the remaining tablets, but again, DO NOT ACCEPT A PARTIAL FILL, unless you can trust your pharmacist to obtain the remainder of the pills within 72 hours.  Prescription refills and/or changes in medication(s):  Prescription refills, and/or changes in dose or medication, will be conducted only during scheduled medication management appointments. (Applies to both, written and electronic prescriptions.) No refills on procedure days. No medication will be changed or started on procedure days. No changes, adjustments, and/or refills will be conducted on a procedure day. Doing so will  interfere with the diagnostic portion of the procedure. No phone refills. No medications will be called into the pharmacy. No Fax refills. No weekend refills. No Holliday refills. No after hours refills.  Remember:  Business hours are:  Monday to Thursday 8:00 AM to 4:00 PM Provider's Schedule: Eric Como, MD - Appointments are:  Medication management: Monday and Wednesday 8:00 AM to 4:00 PM Procedure day: Tuesday and Thursday 7:30 AM to 4:00 PM Wallie Sherry, MD - Appointments are:  Medication management: Tuesday and Thursday 8:00 AM to 4:00 PM Procedure day: Monday and Wednesday 7:30 AM to 4:00 PM (Last update: 10/09/2022) ______________________________________________________________________     ______________________________________________________________________    National Pain Medication Shortage  The [place] is experiencing worsening drug shortages. These have had a negative widespread effect on patient care and treatment. Not expected to improve any time soon. Predicted to last past 2029.   Drug shortage list (generic names) Oxycodone IR Oxycodone/APAP Oxymorphone IR Hydromorphone Hydrocodone /APAP Morphine  Where is the problem?  Manufacturing and supply level.  Will this shortage affect you?  Only if you take any of the above pain medications.  How? You may be unable to fill your prescription.  Your pharmacist may offer a partial fill of your prescription. (Warning: Do not accept partial fills.) Prescriptions partially filled cannot be transferred to another pharmacy. Read our Medication Rules and Regulation. Depending on how much medicine you are dependent on, you may experience withdrawals when unable to get the medication.  Recommendations: Consider ending your dependence on opioid pain medications. Ask your pain specialist to assist you with the process. Consider switching to a medication currently not in shortage, such as Buprenorphine. Talk to  your pain specialist about this option. Consider decreasing your pain medication requirements by managing tolerance thru Drug Holidays. This may help minimize withdrawals, should you run out of medicine. Control your pain thru the use of non-pharmacological interventional therapies.   Your prescriber: Prescribers cannot be blamed for shortages. Medication manufacturing and supply issues cannot be fixed by the prescriber.   NOTE: The prescriber is not responsible for supplying the medication, or solving supply issues. Work with your pharmacist to solve it. The patient is responsible for the decision to take or continue taking the medication and for identifying and securing a legal supply source. By law, supplying the medication is the job and responsibility of the pharmacy. The prescriber is responsible for the evaluation, monitoring, and prescribing of these medications.   Prescribers will NOT: Re-issue prescriptions that have been partially filled. Re-issue prescriptions already sent to a pharmacy.  Re-send prescriptions to a different pharmacy because yours did not have your medication. Ask pharmacist to order more medicine or transfer the prescription to another pharmacy. (Read below.)  New 2023 regulation: August 17, 2022 Revised Regulation Allows DEA-Registered Pharmacies to Transfer Electronic Prescriptions at a Patients Request DEA Headquarters Division - Public Information Office Patients now have the ability to request their electronic prescription be transferred to another pharmacy without having to go back to their practitioner to initiate the request. This revised regulation went into effect on Monday, August 13, 2022.     At a patients request, a DEA-registered retail pharmacy can now transfer an electronic prescription for a controlled substance (schedules II-V) to another DEA-registered retail pharmacy. Prior to this change, patients would have to go through their  practitioner to cancel their prescription and have it re-issued to a different pharmacy. The process was taxing and time consuming for both patients and practitioners.    The Drug Enforcement Administration [facility]) published its intent to revise the process for transferring electronic prescriptions on November 04, 2020.  The final rule was published in the federal register on July 12, 2022 and went into effect 30 days later.  Under the final rule, a prescription can only be transferred once between pharmacies, and only if allowed under existing state or other applicable law. The prescription must remain in its electronic form; may not be altered in any way; and the transfer must be communicated directly between two licensed pharmacists. Its important to note, any authorized refills transfer with the original prescription, which means the entire prescription will be filled at the same pharmacy.  Reference: hugehand.is [facility] website announcement)  Cheapwipes.at.pdf [employer])   [employer] / Vol. 88, No. 143 / Thursday, July 12, 2022 / Rules and Regulations DEPARTMENT OF JUSTICE  Drug Enforcement Administration  21 CFR Part 1306  [Docket No. [id number]]  RIN [id number] Transfer of Electronic Prescriptions for Schedules II-V  Controlled Substances Between Pharmacies for Initial Filling  ______________________________________________________________________       ______________________________________________________________________    Transfer of Pain Medication between Pharmacies  Re: 2023 DEA Clarification on existing regulation  Published on DEA Website: August 17, 2022  Title: Revised Regulation Allows DEA-Registered Pharmacies to Electrical Engineer Prescriptions at a Patients Request DEA Headquarters  Division - [employer]  Patients now have the ability to request their electronic prescription be transferred to another pharmacy without having to go back to their practitioner to initiate the request. This revised regulation went into effect on Monday, August 13, 2022.     At a patients request, a DEA-registered retail pharmacy can now transfer an electronic prescription for a controlled substance (schedules II-V) to another DEA-registered retail pharmacy. Prior to this change, patients would have to go through their practitioner to cancel their prescription and have it re-issued to a different pharmacy. The process was taxing and time consuming for both patients and practitioners.    The Drug Enforcement Administration [facility]) published its intent to revise the process for transferring electronic prescriptions on November 04, 2020.  The final rule was published in the federal register on July 12, 2022 and went into effect 30 days later.  Under the final rule, a prescription can only be transferred once between pharmacies, and only if allowed under existing state or other applicable law. The prescription must remain in its electronic form; may not be altered in any way; and the transfer must be communicated directly between two licensed pharmacists. Its important to note, any authorized refills transfer with the original prescription, which means the entire prescription will be filled at the same pharmacy.    REFERENCES: 1. DEA website announcement hugehand.is  2. Department of Justice website  Cheapwipes.at.pdf  3. DEPARTMENT OF JUSTICE Drug Enforcement Administration 21 CFR Part 1306 [Docket No. [id number]] RIN [id number] Transfer of Electronic Prescriptions for Schedules II-V Controlled Substances Between Pharmacies for Initial  Filling  ______________________________________________________________________       ______________________________________________________________________    Medication Transfer   Notification You are currently compliant and stable on your pain medication regimen. This regimen will be transferred today to your Primary Care Provider (PCP). You will be provided with enough prescriptions to last for 90 days. After that, your prescriptions will need to be taken over by your PCP.  Recommendation Immediately contact your primary care provider to secure an appointment for evaluation before this period is over. Do not wait until the last month to contact them.   Clarification The transfer of your medication regimen does not mean that you are being discharged from our clinic. We will remain available to you for any consultation or interventional therapies you may need.   Alternative Should you decide not to continue taking these medication and would like assistance in permanently stopping them, please let us  know so that we can design a slow tapering down of your regimen.  Reason Our primary responsibility to provide specialized interventional pain management therapies otherwise not available to the community. We have in the past assisted primary care providers with reviewing and adjusting pain medication management therapies, however, we have been transparent to all patients and referring providers that it is not our intention to permanently take over this type of therapy. Transfer of this portion of your care will assist us  in freeing time to assist others in need of our specialty services.   ______________________________________________________________________      ______________________________________________________________________    WARNING: CBD (cannabidiol) & Delta (  Delta-8 tetrahydrocannabinol) products.   Applicable to:  All individuals currently taking or considering taking  CBD (cannabidiol) and, more important, all patients taking opioid analgesic controlled substances (pain medication). (Example: oxycodone; oxymorphone; hydrocodone ; hydromorphone; morphine; methadone; tramadol; tapentadol; fentanyl ; buprenorphine; butorphanol; dextromethorphan ; meperidine; codeine; etc.)  Introduction:  Recently there has been a drive towards the use of natural products for the treatment of different conditions, including pain anxiety and sleep disorders. Marijuana and hemp are two varieties of the cannabis genus plants. Marijuana and its derivatives are illegal, while hemp and its derivatives are not. Cannabidiol (CBD) and tetrahydrocannabinol (THC), are two natural compounds found in plants of the Cannabis genus. They can both be extracted from hemp or marijuana. Both compounds interact with your bodys endocannabinoid system in very different ways. CBD is associated with pain relief (analgesia) while THC is associated with the psychoactive effects (the high) obtained from the use of marijuana products. There are two main types of THC: Delta-9, which comes from the marijuana plant and it is illegal, and Delta-8, which comes from the hemp plant, and it is legal. (Both, Delta-9-THC and Delta-8-THC are psychoactive and give you the high.)   Legality:  Marijuana and its derivatives: illegal Hemp and its derivatives: Legal (State dependent) UPDATE: (02/02/2022) The Drug Enforcement Agency (DEA) issued a letter stating that delta cannabinoids, including Delta-8-THCO and Delta-9-THCO, synthetically derived from hemp do not qualify as hemp and will be viewed as Schedule I drugs. (Schedule I drugs, substances, or chemicals are defined as drugs with no currently accepted medical use and a high potential for abuse. Some examples of Schedule I drugs are: heroin, lysergic acid diethylamide (LSD), marijuana (cannabis), 3,4-methylenedioxymethamphetamine (ecstasy), methaqualone, and peyote.)  (cuetune.com.ee)  Legal status of CBD in [place]:  Conditionally Legal  Reference: FDA Regulation of Cannabis and Cannabis-Derived Products, Including Cannabidiol (CBD) - oemdeals.dk  Warning:  CBD is not FDA approved and has not undergo the same manufacturing controls as prescription drugs.  This means that the purity and safety of available CBD may be questionable. Most of the time, despite manufacturer's claims, it is contaminated with THC (delta-9-tetrahydrocannabinol - the chemical in marijuana responsible for the HIGH).  When this is the case, the [facility] contaminant will trigger a positive urine drug screen (UDS) test for Marijuana (carboxy-THC).   The FDA recently put out a warning about 5 things that everyone should be aware of regarding Delta-8 THC: Delta-8 THC products have not been evaluated or approved by the FDA for safe use and may be marketed in ways that put the public health at risk. The FDA has received adverse event reports involving delta-8 THC-containing products. Delta-8 THC has psychoactive and intoxicating effects. Delta-8 THC manufacturing often involve use of potentially harmful chemicals to create the concentrations of delta-8 THC claimed in the marketplace. The final delta-8 THC product may have potentially harmful by-products (contaminants) due to the chemicals used in the process. Manufacturing of delta-8 THC products may occur in uncontrolled or unsanitary settings, which may lead to the presence of unsafe contaminants or other potentially harmful substances. Delta-8 THC products should be kept out of the reach of children and pets.  NOTE: Because a positive UDS for any illicit substance is a violation of our medication agreement, your opioid analgesics (pain medicine) may be permanently discontinued.  MORE ABOUT CBD  General Information: CBD  was discovered in [birth year] and it is a derivative of the cannabis sativa genus plants (Marijuana and Hemp). It is one of the 113 identified  substances found in Marijuana. It accounts for up to 40% of the plant's extract. As of 2018, preliminary clinical studies on CBD included research for the treatment of anxiety, movement disorders, and pain. CBD is available and consumed in multiple forms, including inhalation of smoke or vapor, as an aerosol spray, and by mouth. It may be supplied as an oil containing CBD, capsules, dried cannabis, or as a liquid solution. CBD is thought not to be as psychoactive as THC (delta-9-tetrahydrocannabinol - the chemical in marijuana responsible for the HIGH). Studies suggest that CBD may interact with different biological target receptors in the body, including cannabinoid and other neurotransmitter receptors. As of 2018 the mechanism of action for its biological effects has not been determined.  Side-effects  Adverse reactions: Dry mouth, diarrhea, decreased appetite, fatigue, drowsiness, malaise, weakness, sleep disturbances, and others.  Drug interactions:  CBD may interact with medications such as blood-thinners. CBD causes drowsiness on its own and it will increase drowsiness caused by other medications, including antihistamines (such as Benadryl), benzodiazepines (Xanax, Ativan, Valium ), antipsychotics, antidepressants, opioids, alcohol and supplements such as kava, melatonin and St. John's Wort.  Other drug interactions: Brivaracetam (Briviact); Caffeine; Carbamazepine (Tegretol); Citalopram (Celexa); Clobazam (Onfi); Eslicarbazepine (Aptiom); Everolimus (Zostress); Lithium; Methadone (Dolophine); Rufinamide (Banzel); Sedative medications (CNS depressants); Sirolimus (Rapamune); Stiripentol (Diacomit); Tacrolimus (Prograf); Tamoxifen ; Soltamox); Topiramate (Topamax); Valproate; Warfarin (Coumadin); Zonisamide. (Last update:  11/26/2022) ______________________________________________________________________     ______________________________________________________________________    Muscle Spasms & Cramps  Cause(s):  Most common - vitamin and/or electrolyte (calcium, potassium, sodium, etc.) deficiencies. Post procedure - steroids (injected, oral, or inhaled) can make your kidneys excrete (loose) electrolytes. Most of the time this will not cause any symptoms however, if you happen to be borderline low on your electrolytes, it may temporarily triggering cramps & spasms.  Possible triggers: Sweating - causes loss of electrolytes thru the skin. Steroids - causes loss of electrolytes thru the urine.  Treatment: (over-the-counter)  Gatorade (or any other electrolyte-replenishing drink) - Take 1, 8 oz glass with each meal (3 times a day). Mechanism of action: Replenishes lost electrolytes. Magnesium 400 to 500 mg - Take 1 tablet twice a day (one with breakfast and one at bedtime). If you have kidney disease talk to your primary care physician before taking any Magnesium. Mechanism of action: Magnesium is a natural muscle relaxant. Tonic Water with quinine - Take 1, 8 oz glass before bedtime.  Mechanism of action: Quinine is used to treat spasms.  Last Update: 06/27/2023  ______________________________________________________________________     ______________________________________________________________________    Appointment Information  It is our goal and responsibility to provide the medical community with assistance in the evaluation and management of patients with chronic pain. Unfortunately our resources are limited. Because we do not have an unlimited amount of time, or available appointments, we are required to closely monitor for unkept or cancelled appointments.  Patient's responsibilities: 1. Punctuality: Patients are required to be physically present in our office at least 15 minutes before their  scheduled appointment. 2. Tardiness: Patients not physically present in our office at their scheduled appointment time will be rescheduled. 3. Plan ahead: Assume that you will encounter traffic and plan to arrive 30 minutes before your appointment. 4. Other appointments and responsibilities: Do not schedule other appointments immediately before or after your scheduled appointment.  5. Be prepared: Make a list of everything that you need to discuss with your provider so that you use your time efficiently. Once the provider leaves your room, he/she  will not return to your room to discuss anything that you neglected to bring up during your allowed time. 6. No children or pets: Do not bring children or pets to your appointment. 7. Cancelling or rescheduling your appointment: Advanced notification (more than 24 hours in advance) is required. 8. No Show: Not calling to cancel an appointment and simply not showing up is unacceptable. This leads to loss of appointments that could have been used by a patient in need. (See below)  Corrective process for repeat offenders:  No Shows: Three (3) No Shows within a 12 month period will result in an automatic discharge from our practice. Rescheduling or cancelling with more than 24 hours notice will not be penalized and will not count against you. Tardiness: If you have to be rescheduled three (3) times due to late arrivals, it will be counted as one (1) No Show. Cancellation or reschedule: Three (3) cancellations or rescheduling where notice was given with less than 24 hours in advance, will be recorded as one (1) No Show.  Types of appointments: New patient initial evaluation: These are evaluations only. Your initial patient questionnaire will be collected and entered into the system. A history of present illness will be taken. Prior lab work, imaging studies, and associated treatments will be reviewed. The provider may order appropriate diagnostic  testing depending on their evaluation and review of available information. No treatments will be started on this visit. 2nd Follow-up visit: During this visit your provider will inform you of the results of the diagnostic tests ordered on the initial evaluation. Based on the providers assessment, treatment options will be offered, at which the patient will decide if he/she is interested in the alternatives. If interested, a treatment plan will be established and started. Procedure visits: Post-procedure evaluation visits: Evaluation visits MM New problems Flare-up evaluations Follow-up after diagnostic testing ______________________________________________________________________     ______________________________________________________________________    Procedure instructions  Stop blood-thinners  Do not eat or drink fluids (other than water) for 8 hours before your procedure  No water for 2 hours before your procedure  Take your blood pressure medicine with a sip of water  Arrive 30 minutes before your appointment  If sedation is planned, bring suitable driver. [place]ada, [place], & public transportation are NOT APPROVED)  Carefully read the Preparing for your procedure detailed instructions  If you have questions call us  at [phone]  Procedure appointments are for procedures only.   NO medication refills or new problem evaluations will be done on procedure days.   Only the scheduled, pre-approved procedure and side will be done.   ______________________________________________________________________     ______________________________________________________________________    Preparing for your procedure  Appointments: If you think you may not be able to keep your appointment, call 24-48 hours in advance to cancel. We need time to make it available to others.  Procedure visits are for procedures only. During your procedure appointment there will be: NO  Prescription Refills*. NO medication changes or discussions*. NO discussion of disability issues*. NO unrelated pain problem evaluations*. NO evaluations to order other pain procedures*. *These will be addressed at a separate and distinct evaluation encounter on the provider's evaluation schedule and not during procedure days.  Instructions: Food intake: Avoid eating anything solid for at least 8 hours prior to your procedure. Clear liquid intake: You may take clear liquids such as water up to 2 hours prior to your procedure. (No carbonated drinks. No soda.) Transportation: Unless otherwise stated by your physician,  bring a driver. (Driver cannot be a Market Researcher, Pharmacist, Community, or any other form of public transportation.) Morning Medicines: Except for blood thinners, take all of your other morning medications with a sip of water. Make sure to take your heart and blood pressure medicines. If your blood pressure's lower number is above 100, the case will be rescheduled. Blood thinners: Make sure to stop your blood thinners as instructed.  If you take a blood thinner, but were not instructed to stop it, call our office [phone] and ask to talk to a nurse. Not stopping a blood thinner prior to certain procedures could lead to serious complications. Diabetics on insulin: Notify the staff so that you can be scheduled 1st case in the morning. If your diabetes requires high dose insulin, take only  of your normal insulin dose the morning of the procedure and notify the staff that you have done so. Preventing infections: Shower with an antibacterial soap the morning of your procedure.  Build-up your immune system: Take 1000 mg of Vitamin C with every meal (3 times a day) the day prior to your procedure. Antibiotics: Inform the nursing staff if you are taking any antibiotics or if you have any conditions that may require antibiotics prior to procedures. (Example: recent joint implants)   Pregnancy: If you are  pregnant make sure to notify the nursing staff. Not doing so may result in injury to the fetus, including death.  Sickness: If you have a cold, fever, or any active infections, call and cancel or reschedule your procedure. Receiving steroids while having an infection may result in complications. Arrival: You must be in the facility at least 30 minutes prior to your scheduled procedure. Tardiness: Your scheduled time is also the cutoff time. If you do not arrive at least 15 minutes prior to your procedure, you will be rescheduled.  Children: Do not bring any children with you. Make arrangements to keep them home. Dress appropriately: There is always a possibility that your clothing may get soiled. Avoid long dresses. Valuables: Do not bring any jewelry or valuables.  Reasons to call and reschedule or cancel your procedure: (Following these recommendations will minimize the risk of a serious complication.) Surgeries: Avoid having procedures within 2 weeks of any surgery. (Avoid for 2 weeks before or after any surgery). Flu Shots: Avoid having procedures within 2 weeks of a flu shots or . (Avoid for 2 weeks before or after immunizations). Barium: Avoid having a procedure within 7-10 days after having had a radiological study involving the use of radiological contrast. (Myelograms, Barium swallow or enema study). Heart attacks: Avoid any elective procedures or surgeries for the initial 6 months after a Myocardial Infarction (Heart Attack). Blood thinners: It is imperative that you stop these medications before procedures. Let us  know if you if you take any blood thinner.  Infection: Avoid procedures during or within two weeks of an infection (including chest colds or gastrointestinal problems). Symptoms associated with infections include: Localized redness, fever, chills, night sweats or profuse sweating, burning sensation when voiding, cough, congestion, stuffiness, runny nose, sore throat, diarrhea,  nausea, vomiting, cold or Flu symptoms, recent or current infections. It is specially important if the infection is over the area that we intend to treat. Heart and lung problems: Symptoms that may suggest an active cardiopulmonary problem include: cough, chest pain, breathing difficulties or shortness of breath, dizziness, ankle swelling, uncontrolled high or unusually low blood pressure, and/or palpitations. If you are experiencing any of these symptoms, cancel  your procedure and contact your primary care physician for an evaluation.  Remember:  Regular Business hours are:  Monday to Thursday 8:00 AM to 4:00 PM  Provider's Schedule: Eric Como, MD:  Procedure days: Tuesday and Thursday 7:30 AM to 4:00 PM  Wallie Sherry, MD:  Procedure days: Monday and Wednesday 7:30 AM to 4:00 PM Last  Updated: 11/26/2023 ______________________________________________________________________     ______________________________________________________________________    Blood Thinners  IMPORTANT NOTICE:  If you take any of these, make sure to notify the nursing staff.  Failure to do so may result in serious injury.  Recommended time intervals to stop and restart blood-thinners, before & after invasive procedures  Generic Name Brand Name Pre-procedure: Stop medication for this amount of time before your procedure: Post-procedure: Wait this amount of time after the procedure before restarting your medication:  Abciximab Reopro 15 days 2 hrs  Alteplase Activase 10 days 10 days  Anagrelide Agrylin    Apixaban Eliquis 3 days 6 hrs  Cilostazol Pletal 3 days 5 hrs  Clopidogrel Plavix 7-10 days 2 hrs  Dabigatran Pradaxa 5 days 6 hrs  Dalteparin Fragmin 24 hours 4 hrs  Dipyridamole Aggrenox 11days 2 hrs  Edoxaban Lixiana; Savaysa 3 days 2 hrs  Enoxaparin  Lovenox 24 hours 4 hrs  Eptifibatide Integrillin 8 hours 2 hrs  Fondaparinux  Arixtra 72 hours 12 hrs  Hydroxychloroquine Plaquenil 11 days    Prasugrel Effient 7-10 days 6 hrs  Reteplase Retavase 10 days 10 days  Rivaroxaban Xarelto 3 days 6 hrs  Ticagrelor Brilinta 5-7 days 6 hrs  Ticlopidine Ticlid 10-14 days 2 hrs  Tinzaparin Innohep 24 hours 4 hrs  Tirofiban Aggrastat 8 hours 2 hrs  Warfarin Coumadin 5 days 2 hrs   Other medications with blood-thinning effects  NOTE: Consider stopping these if you have prolonged bleeding despite not taking any of the above blood thinners. Otherwise ask your provider and this will be decided on a case-by-case basis.  Product indications Generic (Brand) names Note  Cholesterol Lipitor Stop 4 days before procedure  Blood thinner (injectable) Heparin (LMW or LMWH Heparin) Stop 24 hours before procedure  Cancer Ibrutinib (Imbruvica) Stop 7 days before procedure  Malaria/Rheumatoid Hydroxychloroquine (Plaquenil) Stop 11 days before procedure  Thrombolytics  10 days before or after procedures   Over-the-counter (OTC) Products with blood-thinning effects  NOTE: Consider stopping these if you have prolonged bleeding despite not taking any of the above blood thinners. Otherwise ask your provider and this will be decided on a case-by-case basis.  Product Common names Stop Time  Aspirin > 325 mg Goody Powders, Excedrin, etc. 11 days  Aspirin <= 81 mg  7 days  Fish oil  4 days  Garlic supplements  7 days  Ginkgo biloba  36 hours  Ginseng  24 hours  NSAIDs Ibuprofen, Naprosyn, etc. 3 days  Vitamin E  4 days   ______________________________________________________________________     "

## 2024-12-07 NOTE — Progress Notes (Signed)
 Nursing Pain Medication Assessment:  Safety precautions to be maintained throughout the outpatient stay will include: orient to surroundings, keep bed in low position, maintain call bell within reach at all times, provide assistance with transfer out of bed and ambulation.  Medication Inspection Compliance: Pill count conducted under aseptic conditions, in front of the patient. Neither the pills nor the bottle was removed from the patient's sight at any time. Once count was completed pills were immediately returned to the patient in their original bottle.  Medication: Hydrocodone /APAP Pill/Patch Count: 74 of 90 pills/patches remain Pill/Patch Appearance: Markings consistent with prescribed medication Bottle Appearance: Standard pharmacy container. Clearly labeled. Filled Date: 2 / 78 / 2025 Last Medication intake:  Today

## 2024-12-08 ENCOUNTER — Ambulatory Visit: Payer: Self-pay

## 2024-12-31 ENCOUNTER — Encounter: Payer: Self-pay | Admitting: Student in an Organized Health Care Education/Training Program

## 2025-01-11 ENCOUNTER — Ambulatory Visit: Admitting: Student in an Organized Health Care Education/Training Program

## 2025-02-03 ENCOUNTER — Ambulatory Visit: Admitting: Student in an Organized Health Care Education/Training Program

## 2025-02-25 ENCOUNTER — Encounter: Admitting: Nurse Practitioner

## 2025-05-31 ENCOUNTER — Ambulatory Visit
